# Patient Record
Sex: Male | Born: 1950 | Race: Black or African American | Hispanic: No | Marital: Married | Smoking: Former smoker
Health system: Southern US, Community
[De-identification: ages and names within clinical notes are randomized; demographics above are authoritative.]

## PROBLEM LIST (undated history)

## (undated) DIAGNOSIS — G4730 Sleep apnea, unspecified: Secondary | ICD-10-CM

## (undated) DIAGNOSIS — B182 Chronic viral hepatitis C: Secondary | ICD-10-CM

## (undated) DIAGNOSIS — F329 Major depressive disorder, single episode, unspecified: Secondary | ICD-10-CM

## (undated) DIAGNOSIS — T4145XA Adverse effect of unspecified anesthetic, initial encounter: Secondary | ICD-10-CM

## (undated) DIAGNOSIS — K5790 Diverticulosis of intestine, part unspecified, without perforation or abscess without bleeding: Secondary | ICD-10-CM

## (undated) DIAGNOSIS — J449 Chronic obstructive pulmonary disease, unspecified: Secondary | ICD-10-CM

## (undated) DIAGNOSIS — R600 Localized edema: Secondary | ICD-10-CM

## (undated) DIAGNOSIS — F32A Depression, unspecified: Secondary | ICD-10-CM

## (undated) DIAGNOSIS — K635 Polyp of colon: Secondary | ICD-10-CM

## (undated) DIAGNOSIS — K649 Unspecified hemorrhoids: Secondary | ICD-10-CM

## (undated) DIAGNOSIS — T8859XA Other complications of anesthesia, initial encounter: Secondary | ICD-10-CM

## (undated) DIAGNOSIS — S32010A Wedge compression fracture of first lumbar vertebra, initial encounter for closed fracture: Secondary | ICD-10-CM

## (undated) DIAGNOSIS — K219 Gastro-esophageal reflux disease without esophagitis: Secondary | ICD-10-CM

## (undated) DIAGNOSIS — M1990 Unspecified osteoarthritis, unspecified site: Secondary | ICD-10-CM

## (undated) DIAGNOSIS — I509 Heart failure, unspecified: Secondary | ICD-10-CM

## (undated) DIAGNOSIS — N529 Male erectile dysfunction, unspecified: Secondary | ICD-10-CM

## (undated) DIAGNOSIS — F1910 Other psychoactive substance abuse, uncomplicated: Secondary | ICD-10-CM

## (undated) HISTORY — DX: Unspecified hemorrhoids: K64.9

## (undated) HISTORY — DX: Chronic viral hepatitis C: B18.2

## (undated) HISTORY — DX: Heart failure, unspecified: I50.9

## (undated) HISTORY — DX: Sleep apnea, unspecified: G47.30

## (undated) HISTORY — DX: Major depressive disorder, single episode, unspecified: F32.9

## (undated) HISTORY — DX: Gastro-esophageal reflux disease without esophagitis: K21.9

## (undated) HISTORY — DX: Localized edema: R60.0

## (undated) HISTORY — DX: Polyp of colon: K63.5

## (undated) HISTORY — PX: LUMBAR LAMINECTOMY: SHX95

## (undated) HISTORY — DX: Wedge compression fracture of first lumbar vertebra, initial encounter for closed fracture: S32.010A

## (undated) HISTORY — DX: Male erectile dysfunction, unspecified: N52.9

## (undated) HISTORY — DX: Other psychoactive substance abuse, uncomplicated: F19.10

## (undated) HISTORY — DX: Diverticulosis of intestine, part unspecified, without perforation or abscess without bleeding: K57.90

## (undated) HISTORY — DX: Unspecified osteoarthritis, unspecified site: M19.90

## (undated) HISTORY — DX: Morbid (severe) obesity due to excess calories: E66.01

## (undated) HISTORY — DX: Depression, unspecified: F32.A

---
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
## 1997-12-09 ENCOUNTER — Encounter: Admission: RE | Admit: 1997-12-09 | Discharge: 1998-03-09 | Payer: Self-pay | Admitting: Internal Medicine

## 1999-03-12 ENCOUNTER — Encounter: Payer: Self-pay | Admitting: Emergency Medicine

## 1999-03-12 ENCOUNTER — Inpatient Hospital Stay (HOSPITAL_COMMUNITY): Admission: AD | Admit: 1999-03-12 | Discharge: 1999-03-15 | Payer: Self-pay | Admitting: Psychiatry

## 1999-03-16 ENCOUNTER — Other Ambulatory Visit (HOSPITAL_COMMUNITY): Admission: RE | Admit: 1999-03-16 | Discharge: 1999-03-30 | Payer: Self-pay | Admitting: Psychiatry

## 1999-12-03 ENCOUNTER — Encounter: Admission: RE | Admit: 1999-12-03 | Discharge: 1999-12-03 | Payer: Self-pay | Admitting: Specialist

## 1999-12-03 ENCOUNTER — Encounter: Payer: Self-pay | Admitting: Specialist

## 2000-10-09 ENCOUNTER — Ambulatory Visit (HOSPITAL_COMMUNITY): Admission: RE | Admit: 2000-10-09 | Discharge: 2000-10-09 | Payer: Self-pay | Admitting: Internal Medicine

## 2000-10-09 ENCOUNTER — Encounter (INDEPENDENT_AMBULATORY_CARE_PROVIDER_SITE_OTHER): Payer: Self-pay | Admitting: Specialist

## 2001-03-02 ENCOUNTER — Emergency Department (HOSPITAL_COMMUNITY): Admission: EM | Admit: 2001-03-02 | Discharge: 2001-03-03 | Payer: Self-pay | Admitting: Emergency Medicine

## 2001-10-03 ENCOUNTER — Encounter: Payer: Self-pay | Admitting: Emergency Medicine

## 2001-10-03 ENCOUNTER — Encounter: Payer: Self-pay | Admitting: Orthopedic Surgery

## 2001-10-03 ENCOUNTER — Inpatient Hospital Stay (HOSPITAL_COMMUNITY): Admission: EM | Admit: 2001-10-03 | Discharge: 2001-10-05 | Payer: Self-pay | Admitting: Orthopedic Surgery

## 2002-09-20 ENCOUNTER — Encounter (INDEPENDENT_AMBULATORY_CARE_PROVIDER_SITE_OTHER): Payer: Self-pay | Admitting: Specialist

## 2002-09-20 ENCOUNTER — Ambulatory Visit (HOSPITAL_COMMUNITY): Admission: RE | Admit: 2002-09-20 | Discharge: 2002-09-20 | Payer: Self-pay | Admitting: Internal Medicine

## 2004-04-18 ENCOUNTER — Ambulatory Visit: Payer: Self-pay | Admitting: Internal Medicine

## 2004-05-03 ENCOUNTER — Ambulatory Visit: Payer: Self-pay | Admitting: Internal Medicine

## 2004-05-20 HISTORY — PX: TIBIA FRACTURE SURGERY: SHX806

## 2004-08-14 ENCOUNTER — Ambulatory Visit: Payer: Self-pay | Admitting: Internal Medicine

## 2004-09-25 ENCOUNTER — Ambulatory Visit: Payer: Self-pay | Admitting: Internal Medicine

## 2005-02-05 ENCOUNTER — Ambulatory Visit: Payer: Self-pay | Admitting: Internal Medicine

## 2005-04-04 ENCOUNTER — Ambulatory Visit: Payer: Self-pay | Admitting: Internal Medicine

## 2005-04-09 ENCOUNTER — Ambulatory Visit: Payer: Self-pay | Admitting: Internal Medicine

## 2005-04-16 ENCOUNTER — Ambulatory Visit: Payer: Self-pay | Admitting: Internal Medicine

## 2005-05-10 ENCOUNTER — Emergency Department (HOSPITAL_COMMUNITY): Admission: EM | Admit: 2005-05-10 | Discharge: 2005-05-10 | Payer: Self-pay | Admitting: *Deleted

## 2005-05-15 ENCOUNTER — Ambulatory Visit: Payer: Self-pay | Admitting: Internal Medicine

## 2005-09-27 ENCOUNTER — Emergency Department (HOSPITAL_COMMUNITY): Admission: EM | Admit: 2005-09-27 | Discharge: 2005-09-27 | Payer: Self-pay | Admitting: Family Medicine

## 2005-10-01 ENCOUNTER — Ambulatory Visit: Payer: Self-pay | Admitting: Internal Medicine

## 2006-01-06 ENCOUNTER — Ambulatory Visit: Payer: Self-pay | Admitting: Internal Medicine

## 2006-02-03 ENCOUNTER — Ambulatory Visit (HOSPITAL_COMMUNITY): Admission: RE | Admit: 2006-02-03 | Discharge: 2006-02-03 | Payer: Self-pay | Admitting: Internal Medicine

## 2006-02-03 ENCOUNTER — Encounter (INDEPENDENT_AMBULATORY_CARE_PROVIDER_SITE_OTHER): Payer: Self-pay | Admitting: *Deleted

## 2006-02-12 ENCOUNTER — Ambulatory Visit: Payer: Self-pay | Admitting: Internal Medicine

## 2006-03-11 ENCOUNTER — Ambulatory Visit: Payer: Self-pay | Admitting: Internal Medicine

## 2006-04-06 ENCOUNTER — Emergency Department (HOSPITAL_COMMUNITY): Admission: EM | Admit: 2006-04-06 | Discharge: 2006-04-06 | Payer: Self-pay | Admitting: Emergency Medicine

## 2006-04-06 ENCOUNTER — Inpatient Hospital Stay (HOSPITAL_COMMUNITY): Admission: AD | Admit: 2006-04-06 | Discharge: 2006-04-10 | Payer: Self-pay | Admitting: Psychiatry

## 2006-04-06 ENCOUNTER — Ambulatory Visit: Payer: Self-pay | Admitting: Psychiatry

## 2006-11-12 ENCOUNTER — Ambulatory Visit: Payer: Self-pay | Admitting: Internal Medicine

## 2006-11-12 LAB — CONVERTED CEMR LAB
ALT: 22 units/L (ref 0–53)
AST: 23 units/L (ref 0–37)
Albumin: 3.3 g/dL — ABNORMAL LOW (ref 3.5–5.2)
Alkaline Phosphatase: 59 units/L (ref 39–117)
BUN: 6 mg/dL (ref 6–23)
Basophils Absolute: 0.1 10*3/uL (ref 0.0–0.1)
Basophils Relative: 0.9 % (ref 0.0–1.0)
Bilirubin, Direct: 0.1 mg/dL (ref 0.0–0.3)
CO2: 29 meq/L (ref 19–32)
Calcium: 8.8 mg/dL (ref 8.4–10.5)
Chloride: 102 meq/L (ref 96–112)
Cholesterol: 172 mg/dL (ref 0–200)
Creatinine, Ser: 0.9 mg/dL (ref 0.4–1.5)
Eosinophils Absolute: 0.1 10*3/uL (ref 0.0–0.6)
Eosinophils Relative: 0.8 % (ref 0.0–5.0)
GFR calc Af Amer: 112 mL/min
GFR calc non Af Amer: 92 mL/min
Glucose, Bld: 97 mg/dL (ref 70–99)
HCT: 44 % (ref 39.0–52.0)
HDL: 42.1 mg/dL (ref >39.0)
Hemoglobin: 14.7 g/dL (ref 13.0–17.0)
LDL Cholesterol: 107 mg/dL — ABNORMAL HIGH (ref 0–99)
Lymphocytes Relative: 32.2 % (ref 12.0–46.0)
MCHC: 33.4 g/dL (ref 30.0–36.0)
MCV: 82.5 fL (ref 78.0–100.0)
Monocytes Absolute: 0.5 10*3/uL (ref 0.2–0.7)
Monocytes Relative: 7.2 % (ref 3.0–11.0)
Neutro Abs: 4.3 10*3/uL (ref 1.4–7.7)
Neutrophils Relative %: 58.9 % (ref 43.0–77.0)
PSA: 0.41 ng/mL (ref 0.10–4.00)
Platelets: 289 10*3/uL (ref 150–400)
Potassium: 3.9 meq/L (ref 3.5–5.1)
RBC: 5.33 M/uL (ref 4.22–5.81)
RDW: 14.2 % (ref 11.5–14.6)
Sodium: 142 meq/L (ref 135–145)
TSH: 2.95 microintl units/mL (ref 0.35–5.50)
Total Bilirubin: 0.6 mg/dL (ref 0.3–1.2)
Total CHOL/HDL Ratio: 4.1
Total Protein: 7.1 g/dL (ref 6.0–8.3)
Triglycerides: 116 mg/dL (ref 0–149)
VLDL: 23 mg/dL (ref 0–40)
WBC: 7.4 10*3/uL (ref 4.5–10.5)

## 2006-11-24 DIAGNOSIS — I10 Essential (primary) hypertension: Secondary | ICD-10-CM | POA: Insufficient documentation

## 2006-11-24 DIAGNOSIS — M1990 Unspecified osteoarthritis, unspecified site: Secondary | ICD-10-CM | POA: Insufficient documentation

## 2006-11-24 DIAGNOSIS — E669 Obesity, unspecified: Secondary | ICD-10-CM | POA: Insufficient documentation

## 2006-11-24 DIAGNOSIS — G4733 Obstructive sleep apnea (adult) (pediatric): Secondary | ICD-10-CM | POA: Insufficient documentation

## 2006-11-27 ENCOUNTER — Ambulatory Visit: Payer: Self-pay | Admitting: Internal Medicine

## 2006-12-02 ENCOUNTER — Emergency Department (HOSPITAL_COMMUNITY): Admission: EM | Admit: 2006-12-02 | Discharge: 2006-12-02 | Payer: Self-pay | Admitting: Emergency Medicine

## 2006-12-05 ENCOUNTER — Emergency Department (HOSPITAL_COMMUNITY): Admission: EM | Admit: 2006-12-05 | Discharge: 2006-12-06 | Payer: Self-pay | Admitting: Emergency Medicine

## 2006-12-09 ENCOUNTER — Encounter: Admission: RE | Admit: 2006-12-09 | Discharge: 2006-12-09 | Payer: Self-pay | Admitting: Sports Medicine

## 2006-12-16 ENCOUNTER — Telehealth: Payer: Self-pay | Admitting: Internal Medicine

## 2006-12-25 ENCOUNTER — Encounter: Payer: Self-pay | Admitting: Cardiology

## 2006-12-25 ENCOUNTER — Ambulatory Visit: Payer: Self-pay | Admitting: Cardiology

## 2006-12-25 ENCOUNTER — Ambulatory Visit: Payer: Self-pay

## 2006-12-25 LAB — CONVERTED CEMR LAB
Basophils Relative: 2.9 % — ABNORMAL HIGH (ref 0.0–1.0)
CO2: 28 meq/L (ref 19–32)
Chloride: 103 meq/L (ref 96–112)
Creatinine, Ser: 0.9 mg/dL (ref 0.4–1.5)
Eosinophils Relative: 1.1 % (ref 0.0–5.0)
Glucose, Bld: 118 mg/dL — ABNORMAL HIGH (ref 70–99)
HCT: 46 % (ref 39.0–52.0)
Hemoglobin: 15.3 g/dL (ref 13.0–17.0)
MCHC: 33.2 g/dL (ref 30.0–36.0)
Monocytes Absolute: 0.6 10*3/uL (ref 0.2–0.7)
Neutrophils Relative %: 63.5 % (ref 43.0–77.0)
Potassium: 4.2 meq/L (ref 3.5–5.1)
Pro B Natriuretic peptide (BNP): 2 pg/mL (ref 0.0–100.0)
RBC: 5.48 M/uL (ref 4.22–5.81)
RDW: 13.8 % (ref 11.5–14.6)
Sodium: 139 meq/L (ref 135–145)
WBC: 8.9 10*3/uL (ref 4.5–10.5)

## 2007-01-01 ENCOUNTER — Telehealth (INDEPENDENT_AMBULATORY_CARE_PROVIDER_SITE_OTHER): Payer: Self-pay | Admitting: *Deleted

## 2007-01-20 ENCOUNTER — Telehealth: Payer: Self-pay | Admitting: Internal Medicine

## 2007-01-21 ENCOUNTER — Ambulatory Visit: Payer: Self-pay | Admitting: Internal Medicine

## 2007-01-28 ENCOUNTER — Ambulatory Visit (HOSPITAL_COMMUNITY): Admission: RE | Admit: 2007-01-28 | Discharge: 2007-01-29 | Payer: Self-pay | Admitting: Orthopedic Surgery

## 2007-05-27 ENCOUNTER — Emergency Department (HOSPITAL_COMMUNITY): Admission: EM | Admit: 2007-05-27 | Discharge: 2007-05-27 | Payer: Self-pay | Admitting: Emergency Medicine

## 2007-06-23 ENCOUNTER — Ambulatory Visit: Payer: Self-pay | Admitting: Internal Medicine

## 2007-06-23 DIAGNOSIS — F17200 Nicotine dependence, unspecified, uncomplicated: Secondary | ICD-10-CM | POA: Insufficient documentation

## 2007-06-24 LAB — CONVERTED CEMR LAB
Calcium: 8.9 mg/dL (ref 8.4–10.5)
Chloride: 103 meq/L (ref 96–112)
Creatinine, Ser: 0.8 mg/dL (ref 0.4–1.5)
GFR calc non Af Amer: 106 mL/min
Glucose, Bld: 91 mg/dL (ref 70–99)
Sodium: 141 meq/L (ref 135–145)

## 2008-03-25 ENCOUNTER — Ambulatory Visit: Payer: Self-pay | Admitting: Internal Medicine

## 2008-03-28 LAB — CONVERTED CEMR LAB
BUN: 7 mg/dL (ref 6–23)
Calcium: 8.8 mg/dL (ref 8.4–10.5)
Eosinophils Absolute: 0.1 10*3/uL (ref 0.0–0.7)
Eosinophils Relative: 0.7 % (ref 0.0–5.0)
GFR calc Af Amer: 148 mL/min
GFR calc non Af Amer: 123 mL/min
Glucose, Bld: 96 mg/dL (ref 70–99)
HCT: 43.2 % (ref 39.0–52.0)
Hemoglobin: 14.8 g/dL (ref 13.0–17.0)
MCV: 83.2 fL (ref 78.0–100.0)
Monocytes Absolute: 0.6 10*3/uL (ref 0.1–1.0)
Monocytes Relative: 7.2 % (ref 3.0–12.0)
Neutro Abs: 5.2 10*3/uL (ref 1.4–7.7)
Platelets: 240 10*3/uL (ref 150–400)
RDW: 13.8 % (ref 11.5–14.6)

## 2009-01-17 ENCOUNTER — Telehealth: Payer: Self-pay | Admitting: Internal Medicine

## 2009-01-18 ENCOUNTER — Telehealth: Payer: Self-pay | Admitting: Internal Medicine

## 2009-02-15 ENCOUNTER — Ambulatory Visit: Payer: Self-pay | Admitting: Internal Medicine

## 2009-02-17 LAB — CONVERTED CEMR LAB
ALT: 25 units/L (ref 0–53)
AST: 29 units/L (ref 0–37)
Albumin: 3.3 g/dL — ABNORMAL LOW (ref 3.5–5.2)
Alkaline Phosphatase: 55 units/L (ref 39–117)
BUN: 9 mg/dL (ref 6–23)
Basophils Absolute: 0 10*3/uL (ref 0.0–0.1)
Basophils Relative: 0.6 % (ref 0.0–3.0)
Bilirubin, Direct: 0.1 mg/dL (ref 0.0–0.3)
CO2: 29 meq/L (ref 19–32)
Calcium: 8.7 mg/dL (ref 8.4–10.5)
Chloride: 107 meq/L (ref 96–112)
Cholesterol: 139 mg/dL (ref 0–200)
Creatinine, Ser: 0.9 mg/dL (ref 0.4–1.5)
Eosinophils Absolute: 0.1 10*3/uL (ref 0.0–0.7)
Eosinophils Relative: 1.1 % (ref 0.0–5.0)
GFR calc non Af Amer: 110.66 mL/min (ref >60)
Glucose, Bld: 109 mg/dL — ABNORMAL HIGH (ref 70–99)
HCT: 45.5 % (ref 39.0–52.0)
HDL: 39.4 mg/dL (ref >39.00)
Hemoglobin: 14.6 g/dL (ref 13.0–17.0)
LDL Cholesterol: 74 mg/dL (ref 0–99)
Lymphocytes Relative: 31.4 % (ref 12.0–46.0)
Lymphs Abs: 1.9 10*3/uL (ref 0.7–4.0)
MCHC: 32.2 g/dL (ref 30.0–36.0)
MCV: 85.5 fL (ref 78.0–100.0)
Monocytes Absolute: 0.4 10*3/uL (ref 0.1–1.0)
Monocytes Relative: 6.2 % (ref 3.0–12.0)
Neutro Abs: 3.6 10*3/uL (ref 1.4–7.7)
Neutrophils Relative %: 60.7 % (ref 43.0–77.0)
Platelets: 193 10*3/uL (ref 150.0–400.0)
Potassium: 3.9 meq/L (ref 3.5–5.1)
RBC: 5.32 M/uL (ref 4.22–5.81)
RDW: 13.8 % (ref 11.5–14.6)
Sodium: 140 meq/L (ref 135–145)
TSH: 1.38 microintl units/mL (ref 0.35–5.50)
Total Bilirubin: 0.6 mg/dL (ref 0.3–1.2)
Total CHOL/HDL Ratio: 4
Total Protein: 7.5 g/dL (ref 6.0–8.3)
Triglycerides: 130 mg/dL (ref 0.0–149.0)
VLDL: 26 mg/dL (ref 0.0–40.0)
WBC: 6 10*3/uL (ref 4.5–10.5)

## 2009-03-08 ENCOUNTER — Telehealth: Payer: Self-pay | Admitting: Internal Medicine

## 2009-04-09 ENCOUNTER — Emergency Department (HOSPITAL_COMMUNITY): Admission: EM | Admit: 2009-04-09 | Discharge: 2009-04-09 | Payer: Self-pay | Admitting: Family Medicine

## 2009-04-18 ENCOUNTER — Telehealth: Payer: Self-pay | Admitting: Internal Medicine

## 2009-05-15 ENCOUNTER — Encounter: Payer: Self-pay | Admitting: Internal Medicine

## 2009-05-18 ENCOUNTER — Ambulatory Visit: Payer: Self-pay | Admitting: Internal Medicine

## 2009-05-18 DIAGNOSIS — M25569 Pain in unspecified knee: Secondary | ICD-10-CM | POA: Insufficient documentation

## 2009-05-20 HISTORY — PX: KNEE ARTHROSCOPY: SUR90

## 2009-06-09 ENCOUNTER — Observation Stay (HOSPITAL_COMMUNITY)
Admission: RE | Admit: 2009-06-09 | Discharge: 2009-06-10 | Payer: Self-pay | Source: Home / Self Care | Admitting: Specialist

## 2009-10-04 ENCOUNTER — Telehealth: Payer: Self-pay | Admitting: Internal Medicine

## 2010-01-23 ENCOUNTER — Telehealth: Payer: Self-pay | Admitting: Internal Medicine

## 2010-01-24 ENCOUNTER — Telehealth: Payer: Self-pay | Admitting: Internal Medicine

## 2010-01-31 ENCOUNTER — Ambulatory Visit: Payer: Self-pay | Admitting: Internal Medicine

## 2010-01-31 DIAGNOSIS — F528 Other sexual dysfunction not due to a substance or known physiological condition: Secondary | ICD-10-CM | POA: Insufficient documentation

## 2010-02-02 ENCOUNTER — Encounter: Payer: Self-pay | Admitting: Internal Medicine

## 2010-02-02 LAB — CONVERTED CEMR LAB: HCV Ab: REACTIVE — AB

## 2010-02-05 LAB — CONVERTED CEMR LAB
ALT: 30 units/L (ref 0–53)
Chloride: 111 meq/L (ref 96–112)
GFR calc non Af Amer: 145.02 mL/min (ref >60)
Glucose, Bld: 103 mg/dL — ABNORMAL HIGH (ref 70–99)
Potassium: 4.5 meq/L (ref 3.5–5.1)
Sodium: 143 meq/L (ref 135–145)
Total Bilirubin: 0.4 mg/dL (ref 0.3–1.2)

## 2010-02-08 ENCOUNTER — Ambulatory Visit: Payer: Self-pay | Admitting: Internal Medicine

## 2010-02-08 DIAGNOSIS — B1920 Unspecified viral hepatitis C without hepatic coma: Secondary | ICD-10-CM | POA: Insufficient documentation

## 2010-02-08 LAB — CONVERTED CEMR LAB
Ferritin: 89.2 ng/mL (ref 22.0–322.0)
GGT: 34 units/L (ref 7–51)
INR: 1.1 — ABNORMAL HIGH (ref 0.8–1.0)

## 2010-02-14 ENCOUNTER — Encounter: Admission: RE | Admit: 2010-02-14 | Discharge: 2010-02-14 | Payer: Self-pay | Admitting: Internal Medicine

## 2010-03-06 ENCOUNTER — Encounter: Payer: Self-pay | Admitting: Internal Medicine

## 2010-03-26 ENCOUNTER — Telehealth: Payer: Self-pay | Admitting: Internal Medicine

## 2010-06-07 ENCOUNTER — Ambulatory Visit: Admit: 2010-06-07 | Payer: Self-pay | Admitting: Gastroenterology

## 2010-06-19 NOTE — Progress Notes (Signed)
Summary: needs  refill and samples  Phone Note Call from Patient Call back at [facility] Phone [phone] Call back at [phone]   Caller: Patient Call For: Birdie Sons MD Summary of Call: pt needs refill on trazadone pt take 2 pils  instead of 1pill due to unable to sleep. pt also requesting samples of viagra again. Initial call taken by: Heron Sabins,  Oct 04, 2009 11:58 AM  Follow-up for Phone Call        per med list trazodone is 100mg  Take 1 tablet by mouth at bedtime .  Has increased to two at bedtime per self.   Please advise.  Additional Follow-up for Phone Call Additional follow up Details #1::        limit trazodone to 100 mg he is not healthy enough to use viagra make sure see me within past 6 months Additional Follow-up by: Birdie Sons MD,  Oct 04, 2009 1:29 PM    Additional Follow-up for Phone Call Additional follow up Details #2::    Patient aware & says he will limit Trazodone to 1 per night, but he won't sleep.  He will discuss with him ov that he will make ASAP, probably about 2 weeks.   Follow-up by: Rudy Jew, RN,  Oct 04, 2009 2:40 PM  Prescriptions: TRAZODONE HCL 100 MG  TABS (TRAZODONE HCL) Take 1 tablet by mouth at bedtime  #90 x 0   Entered by:   Rudy Jew, RN   Authorized by:   Birdie Sons MD   Signed by:   Rudy Jew, RN on 10/04/2009   Method used:   Electronically to        [facility] [id number] (retail)       [address] [place], [place]  [address]       Ph: [phone]       Fax: [phone]   RxID:   [id number]

## 2010-06-19 NOTE — Progress Notes (Signed)
Summary: potassium refill  Phone Note Refill Request Message from:  Fax from Pharmacy on January 23, 2010 1:03 PM  Refills Requested: Medication #1:  KLOR-CON M20 20 MEQ  CR-TABS 2 by mouth once daily Initial call taken by: Kern Reap CMA Duncan Dull),  January 23, 2010 1:03 PM    Prescriptions: KLOR-CON M20 20 MEQ  CR-TABS (POTASSIUM CHLORIDE CRYS CR) 2 by mouth once daily  #90 Tablet x 1   Entered by:   Kern Reap CMA (AAMA)   Authorized by:   Birdie Sons MD   Signed by:   Kern Reap CMA (AAMA) on 01/23/2010   Method used:   Electronically to        [facility] [id number] (retail)       [address] [place], [place]  [address]       Ph: [phone]       Fax: [phone]   RxID:   [id number]

## 2010-06-19 NOTE — Letter (Signed)
Summary: Request for Surgical Clearance/[facility]   Request for Surgical Clearance/[facility]   Imported By: Maryln Gottron 05/22/2009 13:55:09  _____________________________________________________________________  External Attachment:    Type:   Image     Comment:   External Document

## 2010-06-19 NOTE — Progress Notes (Signed)
Summary: Hep C  Phone Note Call from Patient   Caller: Patient Call For: Birdie Sons MD Summary of Call: [phone] Pt's brother in law has liver Ca, and pt is questioning if he has ever been tested for Hep C, or if he should be tested?  No symptoms at this time.  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 Initial call taken by: Lynann Beaver CMA,  January 24, 2010 11:08 AM  Follow-up for Phone Call        no immediate concern---we can discuss at next OV Follow-up by: Birdie Sons MD,  January 24, 2010 12:17 PM  Additional Follow-up for Phone Call Additional follow up Details #1::        left message on machine to call back to office. Lucious Groves CMA  January 24, 2010 1:36 PM   Left message on voicemail to call back. Lucious Groves CMA  January 25, 2010 2:14 PM     Additional Follow-up for Phone Call Additional follow up Details #2::    Patient notified and scheduled appt. Follow-up by: Lucious Groves CMA,  January 26, 2010 9:26 AM

## 2010-06-19 NOTE — Progress Notes (Signed)
Summary: Pt returning phone call. Pls call back.  Phone Note Call from Patient Call back at [phone] cell   Caller: Patient Summary of Call: Pt called and said that he has been trying to return someone call from [facility] he rcvd last week. Pls call pt at earliest convenience.  Initial call taken by: Lucy Antigua,  March 26, 2010 4:23 PM  Follow-up for Phone Call        sent in a rx for potssium and pharmacy sent a fax back asking for a member number.  Acct [phone].  Form faxed  back to pharmacy Follow-up by: Alfred Levins, CMA,  March 26, 2010 4:42 PM

## 2010-06-19 NOTE — Assessment & Plan Note (Signed)
Summary: OV TO DISCUSS HEP C/KB   Vital Signs:  Patient profile:   60 year old male Height:      72 inches Weight:      425 pounds BMI:     57.85 Temp:     98.3 degrees F oral BP sitting:   124 / 80  (left arm) Cuff size:   large  Vitals Entered By: Kern Reap CMA Duncan Dull) (January 31, 2010 11:12 AM) CC: concerns with hep c   CC:  concerns with hep c.  History of Present Illness: concerned with hepatitis C---Brother in law with HEp C, now living with him more importantly pt admits for the first time that he abused iv drugs in the 80s no hx of hepatitis or jaundice hx of olcohol overuse no recent fever or chills , no chronic recurrent infections  he is interested in treatment for ED  Current Medications (verified): 1)  Lasix 40 Mg Tabs (Furosemide) .... One By Mouth Daily 2)  Omeprazole 20 Mg Cpdr (Omeprazole) .... One By Mouth Daily Prn 3)  Bupropion Hcl 150 Mg  Tb12 (Bupropion Hcl) .... Take 1 Tablet By Mouth Two Times A Day 4)  Trazodone Hcl 100 Mg  Tabs (Trazodone Hcl) .... Take 1 Tablet By Mouth At Bedtime 5)  Klor-Con M20 20 Meq  Cr-Tabs (Potassium Chloride Crys Cr) .... 2 By Mouth Once Daily  Allergies: 1)  Penicillin V Potassium (Penicillin V Potassium) 2)  Sulfamethoxazole (Sulfamethoxazole)  Past History:  Past Medical History: Last updated: 03/25/2008 Depression Hypertension Osteoarthritis sleep apnea LV dysfunction,hx of OBESE  Past Surgical History: Last updated: 05/18/2009 herniated disc L5 1995 back surgery 2008 Tonsillectomy--as child  Social History: Last updated: 01/31/2010 Married Current Smoker Regular exercise-no IVDA 1980s  Risk Factors: Exercise: no (06/23/2007)  Risk Factors: Smoking Status: current (05/18/2009) Packs/Day: 1.0 (05/18/2009)  Social History: Married Current Smoker Regular exercise-no IVDA 1980s  Review of Systems       Flu Vaccine Consent Questions     Do you have a history of severe allergic  reactions to this vaccine? no    Any prior history of allergic reactions to egg and/or gelatin? no    Do you have a sensitivity to the preservative Thimersol? no    Do you have a past history of Guillan-Barre Syndrome? no    Do you currently have an acute febrile illness? no    Have you ever had a severe reaction to latex? no    Vaccine information given and explained to patient? yes    Are you currently pregnant? no    Lot Number:[id number]   Exp Date:11/17/2010   Site Given  Left Deltoid IM   Physical Exam  General:  alert and well-developed.  morbidly obese Head:  normocephalic and atraumatic.   Eyes:  pupils equal and pupils round.  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 One by mouth daily 2)  Omeprazole 20 Mg Cpdr (Omeprazole) .... One by mouth  daily prn 3)  Bupropion Hcl 150 Mg Tb12 (Bupropion hcl) .... Take 1 tablet by mouth two times a day 4)  Trazodone Hcl 100 Mg Tabs (Trazodone hcl) .... Take 1 tablet by mouth at bedtime 5)  Klor-con M20 20 Meq Cr-tabs (Potassium chloride crys cr) .... 2 by mouth once daily  Other Orders: Admin 1st Vaccine (44010) Flu Vaccine 18yrs + (27253) TLB-BMP (Basic Metabolic Panel-BMET) (80048-METABOL)

## 2010-06-19 NOTE — Assessment & Plan Note (Signed)
Summary: discuss labs/dm   Vital Signs:  Patient profile:   60 year old male Height:      72 inches (182.88 cm) Weight:      425 pounds (193.18 kg) Temp:     97.9 degrees F (36.61 degrees C) oral Pulse rate:   90 / minute BP sitting:   116 / 80  (left arm) Cuff size:   large  Vitals Entered By: Josph Macho RMA (February 08, 2010 11:34 AM)  O2 Flow:  Room air CC: Go over lab results/ CF Is Patient Diabetic? No   CC:  Go over lab results/ CF.  Current Medications (verified): 1)  Lasix 40 Mg Tabs (Furosemide) .... One By Mouth Daily 2)  Omeprazole 20 Mg Cpdr (Omeprazole) .... One By Mouth Daily Prn 3)  Bupropion Hcl 150 Mg  Tb12 (Bupropion Hcl) .... Take 1 Tablet By Mouth Two Times A Day 4)  Trazodone Hcl 100 Mg  Tabs (Trazodone Hcl) .... Take 1 Tablet By Mouth At Bedtime 5)  Klor-Con M20 20 Meq  Cr-Tabs (Potassium Chloride Crys Cr) .... 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 It's likely that he was exposed in the 1980s related to IV drug abuse. He understands the need to inform his family members and wife especially. We will set up an appointment with hepatitis C clinic will order laboratories and imaging studies. Patient understands the workup and understands that he will followup with the hepatitis C clinic. total face-to-face time 30 minutes. All spent in counseling and ordering tests and scheduling referrals. Orders: Venipuncture ([address]) T-Hepatitis C RNA Quant PCR ([id number]) Specimen Handling (08657) Hepatitis C Clinic Referral (HepC) Radiology Referral (Radiology) TLB-GGT (Gamma GT) (82977-GGT) TLB-Ferritin (82728-FER) TLB-PTT (85730-PTTL) TLB-PT (Protime) (85610-PTP)  Complete Medication List: 1)  Lasix 40 Mg Tabs  (Furosemide) .... One by mouth daily 2)  Omeprazole 20 Mg Cpdr (Omeprazole) .... One by mouth daily prn 3)  Bupropion Hcl 150 Mg Tb12 (Bupropion hcl) .... Take 1 tablet by mouth two times a day 4)  Trazodone Hcl 100 Mg Tabs (Trazodone hcl) .... Take 1 tablet by mouth at bedtime 5)  Klor-con M20 20 Meq Cr-tabs (Potassium chloride crys cr) .... 2 by mouth once daily

## 2010-08-05 LAB — DIFFERENTIAL
Basophils Relative: 1 % (ref 0–1)
Lymphs Abs: 2.4 10*3/uL (ref 0.7–4.0)
Monocytes Relative: 7 % (ref 3–12)
Neutro Abs: 5.2 10*3/uL (ref 1.7–7.7)
Neutrophils Relative %: 62 % (ref 43–77)

## 2010-08-05 LAB — CBC
HCT: 44.3 % (ref 39.0–52.0)
Hemoglobin: 14.4 g/dL (ref 13.0–17.0)
MCHC: 32.6 g/dL (ref 30.0–36.0)
RDW: 14.7 % (ref 11.5–15.5)

## 2010-08-05 LAB — ABO/RH: ABO/RH(D): A POS

## 2010-08-05 LAB — COMPREHENSIVE METABOLIC PANEL
Albumin: 3.1 g/dL — ABNORMAL LOW (ref 3.5–5.2)
BUN: 7 mg/dL (ref 6–23)
Calcium: 8.8 mg/dL (ref 8.4–10.5)
Creatinine, Ser: 0.82 mg/dL (ref 0.4–1.5)
Total Protein: 7 g/dL (ref 6.0–8.3)

## 2010-08-05 LAB — URINALYSIS, ROUTINE W REFLEX MICROSCOPIC
Hgb urine dipstick: NEGATIVE
Protein, ur: NEGATIVE mg/dL
Urobilinogen, UA: 1 mg/dL (ref 0.0–1.0)
pH: 5.5 (ref 5.0–8.0)

## 2010-08-05 LAB — URINE MICROSCOPIC-ADD ON

## 2010-08-05 LAB — PROTIME-INR: INR: 1.17 (ref 0.00–1.49)

## 2010-08-05 LAB — CROSSMATCH: Antibody Screen: NEGATIVE

## 2010-08-05 LAB — APTT: aPTT: 27 seconds (ref 24–37)

## 2010-08-14 ENCOUNTER — Other Ambulatory Visit: Payer: Self-pay | Admitting: *Deleted

## 2010-08-14 DIAGNOSIS — F329 Major depressive disorder, single episode, unspecified: Secondary | ICD-10-CM

## 2010-08-14 DIAGNOSIS — K219 Gastro-esophageal reflux disease without esophagitis: Secondary | ICD-10-CM

## 2010-08-14 DIAGNOSIS — G4700 Insomnia, unspecified: Secondary | ICD-10-CM

## 2010-08-14 MED ORDER — BUPROPION HCL ER (SR) 150 MG PO TB12: 150.0000 mg | ORAL_TABLET | Freq: Two times a day (BID) | ORAL | Status: DC

## 2010-08-14 MED ORDER — OMEPRAZOLE 20 MG PO CPDR: 20.0000 mg | DELAYED_RELEASE_CAPSULE | Freq: Two times a day (BID) | ORAL | Status: DC

## 2010-08-14 MED ORDER — TRAZODONE HCL 100 MG PO TABS: 100.0000 mg | ORAL_TABLET | Freq: Every day | ORAL | Status: DC
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
## 2010-10-02 NOTE — Assessment & Plan Note (Signed)
[facility]                                 ON-CALL NOTE   MEREL, SANTOLI                          MRN:          [id number]  DATE:12/07/2006                            DOB:          Jul 14, 1950    PHONE NUMBER:  [phone].   This is a patient of Dr. Cato Mulligan.   Gentleman calls about his back pain, sciatica which is severe.  He  related a long story about his back including his ER visit on Friday for  which he required IV narcotics because prednisone type medications and  regular pain medicines were not helping.  He is now on hydromorphone 4  mg every four to six hours from the emergency room and has an  appointment with the orthopedist on Tuesday.  He wonders what else he  can take.  He has no other neurologic symptoms and no fever.  He has  Ultram from when Dr. Cato Mulligan gave it to him before, although it has not  helped and wonders if he could take it together.  I told him I did not  think that they would work well together in helping with his pain,  although he could take Tylenol or ibuprofen.  However, he should call  his pharmacist to look at drug interactions and further advice on the  medication and to call the office tomorrow if need be.  Otherwise, he  absolutely needs to be seen by the orthopedist on Tuesday.     Neta Mends. Panosh, MD  Electronically Signed    WKP/MedQ  DD: 12/07/2006  DT: 12/07/2006  Job #: [id number]

## 2010-10-02 NOTE — Op Note (Signed)
Derrick Mata, Derrick Mata                 ACCOUNT NO.:  [id number]   MEDICAL RECORD NO.:  [id number]          PATIENT TYPE:  OIB   LOCATION:  1535                         FACILITY:  [facility]   PHYSICIAN:  Georges Lynch. Gioffre, M.D.DATE OF BIRTH:  [birth year]   DATE OF PROCEDURE:  01/28/2007  DATE OF DISCHARGE:                               OPERATIVE REPORT   SURGEON:  Georges Lynch. Darrelyn Hillock, M.D.   ASSISTANT:  Jene Every, M.D.   PREOPERATIVE DIAGNOSIS:  Large herniated lumbar disc at L5-S1 on the  right.  Note, all his symptoms involve the right S1 nerve root.  He had  pain down the posterior aspect of his right leg, right calf, and down in  the plantar aspect of his right foot, and decreased sensation and  numbness along the lateral aspect of his right foot.   POSTOPERATIVE DIAGNOSIS:  Large herniated lumbar disc at L5-S1 on the  right.  Note, all his symptoms involve the right S1 nerve root.  He had  pain down the posterior aspect of his right leg, right calf, and down in  the plantar aspect of his right foot, and decreased sensation and  numbness along the lateral aspect of his right foot.   OPERATION:  1. Decompression of the lateral recess at L5-S1 for spinal stenosis.  2. Microdiscectomy at L5-S1 on the right.  3. Foraminotomy at L5-S1 on the right.   PROCEDURE:  Under general anesthesia with the patient on the regular  operating table with rolls to protect him, sterile prep and draping of  the low back was carried out.  He had 2 grams of IV Ancef.  At that  time, an incision was made through the old incision site.  We went down  and identified the sacrum and worked our way up to the L5-S1 interspace.  An x-ray was taken followed by a second x-ray.  Note, he weighs 450  pounds and we went through all the measures preop for the proper  instruments and retractors.  Once we identified the L5-S1 interspace, we  then went down and carried out hemilaminectomy in the usual fashion.  Note, his  lateral recess was severely tight and the nerve root was  extremely tight, so what we did was we gently went out laterally and  decompressed the lateral recess.  We did a partial facetectomy.  We then  went out and did a nice foraminotomy, as well.  We went up proximally as  well to expose the space to decompress the space first.  The microscope  was used during the procedure.  We then removed the ligamentum flavum in  the usual fashion.  We went down and identified the S1 root. Immediately  upon identifying the S1 root, we noted that the root was discolored and  indented from the severe pressure from his stenosis.  We then gently  examined the axillary region of the root.  We gently retracted the root  and the dura.  We cauterized the lateral recess veins with the bipolar.  A cruciate incision was made at the posterior longitudinal ligament.  Immediately upon doing that, dic material extruded out through the site  under an extreme amount of pressure.  Following that, we then utilized a  nerve hook to remove the other fragments of disc that were  subligamentous migrating down toward the foramen. Also, we went medially  with the nerve hook as well as the Epstein curets.  We decompressed the  disc into the disc space and completed the discectomy.  We explored  proximally, as well.  There were no other fragments noted. We had a nice  decompression of the root but the root was really irritated from all the  pressure.  We thoroughly irrigated out the area, loosely applied some  thrombin soaked  Gelfoam, closed the wound in layers in the usual fashion.  I did leave a  small portion of the proximal and distal deep portions of the wound open  for drainage purposes.  Sterile dressings were applied.  The patient  left the operating room in satisfactory condition.           ______________________________  Georges Lynch Darrelyn Hillock, M.D.     RAG/MEDQ  D:  01/28/2007  T:  01/28/2007  Job:  [id number]

## 2010-10-02 NOTE — Assessment & Plan Note (Signed)
[facility]                            CARDIOLOGY OFFICE NOTE   NAME:MACKEY, MERLEN GURRY                        MRN:          [id number]  DATE:12/26/2006                            DOB:          1951-04-02    I have received and reviewed Mr. Colyn Miron echocardiogram.  Surprisingly, he has normal left ventricular function and no evidence of  any valvular heart disease.  His right side function was also said to be  normal.   His blood work including CBC, TSH, BNP, and Chem-7 were normal as well.   I spoke to Mr. Damita Lack by phone this morning.  I have tried to call Dr.  Darrelyn Hillock to clear him for surgery because of all his pain.  Mr. Damita Lack  tells me he is going to the beach today for a week.  I am getting ready  to leave on vacation as well.  Dr. Darrelyn Hillock could not be reached today.   PLAN:  1. No need for followup in the office.  2. The patient strongly urged to loose weight.  It was discussed in      the office yesterday.  3. I will clear him for surgery with a moderate to high risk because      of his obesity.  4. I would also recommend Dr. Darrelyn Hillock have pulmonary critical care get      involved in his perioperative period.     Thomas C. Daleen Squibb, MD, [facility]  Electronically Signed    TCW/MedQ  DD: 12/26/2006  DT: 12/26/2006  Job #: [id number]   cc:   Windy Fast A. Darrelyn Hillock, M.D.  Bruce Rexene Edison Swords, MD

## 2010-10-05 NOTE — Assessment & Plan Note (Signed)
[facility]                           GASTROENTEROLOGY OFFICE NOTE   NAME:Derrick Mata, Derrick Mata                        MRN:          [id number]  DATE:01/06/2006                            DOB:          1950/09/18    REASON FOR EVALUATION:  Surveillance colonoscopy and rectal bleeding.   HISTORY OF PRESENT ILLNESS:  This is a 60 year old African American male  with a history of morbid obesity, obstructive sleep apnea, asthma, tobacco  abuse and congestive heart failure.  He was evaluated in May of 2002 for  groin discomfort and rectal bleeding.  He underwent complete colonoscopy Sep 20, 2002.  Examination revealed internal hemorrhoids, left-sided  diverticulosis, and multiple diminutive colon polyps, which were removed and  found to be adenomatous.  Followup in 3 years recommended.  The patient has  been doing well until last 05-19-23, when he had pronounced rectal bleeding,  with blood filling the toilet bowel.  There was no associated abdominal pain  or rectal pain.  He states that he was evaluated in ER and told that it was  probably a hemorrhoid.  He has had no recurrent bleeding since.   GASTROINTESTINAL REVIEW OF SYSTEMS:  Remarkable only for occasional gas.   PAST MEDICAL HISTORY:  As above.   PAST SURGICAL HISTORY:  1. Herniated disk.  2. Cystectomy.   ALLERGIES:  1. PENICILLIN.  2. SULFA.   CURRENT MEDICATIONS:  1. Omeprazole 20 mg daily.  2. Furosemide 40 mg daily.  3. Potassium chloride 20 mEq daily.  4. Bupropion 150 mg daily.  5. Spiriva once daily.  6. He also uses trazodone and Advair p.r.n.   SOCIAL HISTORY:  The patient is retired.  Married, with 4 children.  Smokes.  He has a prior history of alcoholism, and has attended [employer]   FAMILY HISTORY:  Since his last evaluation he tells me that his sister was  diagnosed with colon cancer and colon polyps.  She was diagnosed at age 47.   PHYSICAL EXAMINATION:  GENERAL:   Massively obese gentleman in no acute  distress.  VITAL SIGNS:  Blood pressure 120/60, heart rate is 78 and regular, weight is  458.6 pounds.  He is 6 feet 1 inch in height.  HEENT:  Sclerae are anicteric, conjunctivae are pink, oral mucosa is intact.  LUNGS:  Clear.  HEART:  Regular.  ABDOMEN:  Obese and soft without obvious tenderness or mass, good bowel  sounds heard.   IMPRESSION:  [age]-year-old gentleman with a history of multiple  adenomatous colon polyps, family history of colon cancer, and an episode of  bleeding last year.  I agree that the bleeding was probably hemorrhoidal,  though it may have been diverticular-based on history.  Currently due for a  surveillance colonoscopy.  His biggest risk factor is his body habitus and  sleep apnea.   RECOMMENDATIONS:  Colonoscopy with polypectomy if necessary.  This will be  performed at the hospital due to his comorbidities.  The nature of the  procedure, as well as the risks, benefits and alternatives were carefully  explained to  the patient.  He understood, and was anxious to proceed.                                   Wilhemina Bonito. Eda Keys., MD   JNP/MedQ  DD:  01/07/2006  DT:  01/08/2006  Job #:  [id number]   cc:   Valetta Mole. Swords, MD

## 2010-10-05 NOTE — Discharge Summary (Signed)
Derrick Mata, Derrick Mata                 ACCOUNT NO.:  [id number]   MEDICAL RECORD NO.:  [id number]          PATIENT TYPE:  IPS   LOCATION:  0508                          FACILITY:  [facility]   PHYSICIAN:  Derrick Mata, M.D.      DATE OF BIRTH:  [birth year]   DATE OF ADMISSION:  04/06/2006  DATE OF DISCHARGE:  04/10/2006                               DISCHARGE SUMMARY   CHIEF COMPLAINT:  This is the second admission to [facility] for this 60 year old African American male married, voluntarily  admitted with relapse on alcohol one month prior to this admission,  followed by relapses on crack cocaine.  The last drink the night before  this admission, drinking close to a fifth a day, doing this several  times a week.  He was sober six years, going to meetings.  He times his  retirement with stressor along with his __________ gone and was looking  for a way to shoot himself.   PAST PSYCHIATRIC HISTORY:  A second time at [facility], his third  prior admission.  Cannot be evaluated in __________ .  No history of  cocaine for four to five times in the past two weeks.  Alcohol since age  89.   MEDICAL HISTORY:  1. Sleep apnea.  2. Congestive heart failure.  3. Morbid obesity.   MEDICATIONS:  1. Wellbutrin in the morning and at night.  2. Nexium.  3. Lasix 40 mg per day.  4. K-Dur 40 mEq daily.   PHYSICAL EXAMINATION:  Performed and did not show any acute findings.   LABORATORY WORKUP:  Blood chemistries, liver enzymes:  SGOT 28, SGPT 22,  total bilirubin 0.7, TSH 1.774.  Drug screening positive for cocaine.  Sodium 143, potassium 4.2, glucose 117, BUN 2, creatinine 0.8.   PHYSICAL EXAMINATION:  This is a fully alert, anxious male __________  his alcohol use.  There is normal rate and __________ .  Mood depressed.  Affect depressed.  __________  and relevant.  Suicide ideation with plan  to shoot himself.  Sense of hopelessness and helplessness.  Upset with  himself  for his active relapse.  Neuro evaluation shows no  hallucination.  Cognition was preserved.   IMPRESSION:  AXIS I:  1. Major depression, recurrent.  2. Alcohol dependence.  3. Cocaine abuse.  AXIS II:  No diagnosis.  AXIS III:  1. Obstructive sleep apnea.  2. Morbid obesity.  3. Congestive heart failure.  AXIS IV:  Moderate.  AXIS V:  Upon admission 30, highest in the last year is 65.   HOSPITAL COURSE:  He was admitted.  He was started in individual and  group psychotherapy.  He was given Ambien for sleep.  He was detoxified  with Librium, maintained on the K-Dur 40 mEq daily, Lasix 40 mg per day,  Spiriva 18 mg daily, Advair Discus 250/50 one puff twice a day.  We  started Librium protocol.  We prescribed Remeron for sleep.  Endorsed  that had three years relapsing on alcohol and crack.  Used to go to  meeting,  was okay when he was going.  He quit going.  Endorsed boredom.  He retired in 2000, not much to do.  Feeling miserable, cannot sleep in  bed due to his being so awake.  He sits in a recliner, cannot sleep with  the wife, cannot have sex.  He was going to shoot himself, going to use  a gun.  Worked 32 years in [place] and has been married 29 years, two  stepchildren, two biological children.  He started alcohol when he was  10.  He has a great-uncle who is close with history of alcohol  dependence.  He has been on Wellbutrin.  The mother has history of  depression.   Pursued the detox further.  Aware of the underlying depressed mood with  no energy and no motivation.  Endorses pain in his legs, shortness of  breath.  Sleeping was an issue.  Ambien did not help and that is when he  was switched to Remeron.  We pursued further detox and he had issues  sleeping in the unit because he used to use a recliner.  Could not sleep  through a night, very tired in the morning, falling asleep.   Family session April 09, 2006 with his wife and though he was not  suicidal but  endorsed that he did not want to cause misery to his  family.  Wife endorsed that he was a good [name].  She has always been  supportive through his use of alcohol and drug use.  They talk about  improving their communication.  The possibility of going to a  residential program was raised but he would rather not.  Endorsed that  he had been through multiple treatments and he knew what he needed to  do.  He has a prior history of being successful going to meetings.  He  was planning to go back to meetings.   In April 10, 2006, he was in full contact with reality and there was  no suicidal or homicidal ideas.  No hallucination and no delusions.  No  acute withdrawal.  Dealing with a lot of issues, having to sleep in a  recliner.  He felt that he was going to sleep better at home.  He could  take it from here.  Will go to AA.  Also as he abstains, he could become  again a candidate for bariatric surgery and he was going to go to an  orientation in January 2008.  So he had like a sense of future, things  could get better, committed to abstinence.   DISCHARGE DIAGNOSES:  AXIS I:  1. Major depression, recurrent.  2. Alcohol dependence.  3. Cocaine abuse.  AXIS II:  No diagnosis.  AXIS III:  1. Obstructive sleep apnea.  2. Morbid obesity.  3. Congestive heart failure.  AXIS IV:  Moderate.  AXIS V:  Upon discharge 50-55.   Discharge home on Lasix 40 mg per day, Spiriva 18 mg nebulizer daily,  Advair 250/50 one puff twice a day, Nexium 20 mg per day, K-Dur 20 mEq  per day, Remeron 45 mg at bedtime.   Follow up in outpatient clinic.      Derrick Mata, M.D.  Electronically Signed     IL/MEDQ  D:  05/02/2006  T:  05/03/2006  Job:  [id number]

## 2010-10-05 NOTE — Op Note (Signed)
[facility]  Patient:    Derrick Mata, Derrick Mata Visit Number: [id number] MRN: [id number]          Service Type: SUR Attending Physician:  Cain Sieve Dictated by:   Vania Rea. Supple, M.D. Proc. Date: 10/03/01 Admit Date:  10/03/2001 Discharge Date: 10/05/2001                             Operative Report  PREOPERATIVE DIAGNOSIS:  Displaced left distal fibular fracture.  POSTOPERATIVE DIAGNOSIS:  Displaced left distal fibular fracture.  PROCEDURE:  Open reduction and internal fixation of left distal fibular fracture.  SURGEON:  Vania Rea. Supple, M.D.  ASSISTANTDruscilla Brownie. Underwood III, P.A.-C.  ANESTHESIA:  General endotracheal.  TOURNIQUET TIME:  Forty-six minutes.  ESTIMATED BLOOD LOSS:  Minimal.  DRAINS:  None.  HISTORY:  The patient is a 60 year old gentleman who sustained a displaced left distal fibular fracture as well as widening of the medial clear space with lateral subluxation of the talus.  He is brought to the [facility] operating room today, Saturday, Oct 03, 2001, for planned open reduction and internal fixation of the displaced fracture.  Preoperatively, the patient was counseled on treatment options as well as risks versus benefits thereof.  Possible complications of bleeding, infection, neurovascular injury, DVT, PE, malunion, nonunion, loss of fixation, and possible need for additional surgery were all reviewed.  He understands, accepts, and agrees with our planned procedure.  DESCRIPTION OF PROCEDURE:  After undergoing routine preoperative evaluation, the patient was placed supine on the operating room table and underwent smooth induction of general endotracheal anesthesia.  He did receive prophylactic antibiotics.  The tourniquet was applied to the left calf due to his large size.  The left foot and ankle and lower leg were then sterilely prepped and draped in the standard fashion.  The leg was exsanguinated with  the tourniquet inflated to 250 mmHg.  We made a direct lateral approach to the distal fibula through an 8 cm long incision centered over the distal fibula.  Skin flaps were elevated anteriorly and posteriorly with electrocautery and used for hemostasis.  The deep fascia was then divided along the line of the skin incision, reflecting the peroneal musculature and tendons posteriorly and allowing [place] to expose the distal fibula with subperiosteal dissection.  The fracture site was exposed and reduced.  We fashioned a seven-hole one-third tubular plate to fit over the posterolateral aspect of the distal fibula, and this was then applied utilizing 3.5 cortical screws proximally, 4.0 cancellous screws distally, and a lag screw x2 of 3.5 cortical screw.  Anatomic reduction at the fracture site with good alignment was achieved clinically. Fluoroscopic images were then obtained which confirmed good position of the hardware and anatomic alignment of the fracture site.  At this point, the wound was then copiously irrigated.  Hemostasis was achieved.  The wound was closed in layers with 0 Vicryl at the deep fascia, 2-0 Vicryl for the subcutaneous, and staples applied to the skin.  Adaptic was applied over the incision.  We did instill 20 cc of plain Marcaine, 0.5% into the incision edges at the end of the case.  The tourniquet was then let down at this point.  A bulky dry dressing was then applied about the foot and ankle and a well-padded short leg plastic U-splint was applied with the foot in inversion to help reduce the talus beneath the tibial plafond.  The patient was subsequently extubated and taken to the recovery room in stable condition. Dictated by:   Vania Rea. Supple, M.D. Attending Physician:  Cain Sieve DD:  10/03/01 TD:  10/06/01 Job: [id number]

## 2010-10-31 ENCOUNTER — Other Ambulatory Visit: Payer: Self-pay | Admitting: *Deleted

## 2010-10-31 MED ORDER — TRAZODONE HCL 100 MG PO TABS: 100.0000 mg | ORAL_TABLET | Freq: Every day | ORAL | Status: DC

## 2010-11-02 ENCOUNTER — Other Ambulatory Visit: Payer: Self-pay | Admitting: *Deleted

## 2010-11-02 MED ORDER — POTASSIUM CHLORIDE CRYS ER 20 MEQ PO TBCR: 20.0000 meq | EXTENDED_RELEASE_TABLET | Freq: Two times a day (BID) | ORAL | Status: DC

## 2010-12-06 ENCOUNTER — Telehealth: Payer: Self-pay

## 2010-12-06 NOTE — Telephone Encounter (Signed)
Pt requesting an rx for a new CPAP machine because the one that he has is from 2007. Please advise

## 2010-12-19 ENCOUNTER — Other Ambulatory Visit: Payer: Self-pay | Admitting: *Deleted

## 2010-12-19 DIAGNOSIS — F329 Major depressive disorder, single episode, unspecified: Secondary | ICD-10-CM

## 2010-12-19 MED ORDER — BUPROPION HCL ER (SR) 150 MG PO TB12: 150.0000 mg | ORAL_TABLET | Freq: Two times a day (BID) | ORAL | Status: DC

## 2011-01-30 ENCOUNTER — Encounter: Payer: Self-pay | Admitting: Internal Medicine

## 2011-02-06 ENCOUNTER — Encounter: Payer: Self-pay | Admitting: Internal Medicine

## 2011-02-28 ENCOUNTER — Ambulatory Visit (AMBULATORY_SURGERY_CENTER): Payer: Medicare Other | Admitting: *Deleted

## 2011-02-28 ENCOUNTER — Encounter: Payer: Self-pay | Admitting: Internal Medicine

## 2011-02-28 ENCOUNTER — Telehealth: Payer: Self-pay

## 2011-02-28 VITALS — Ht 73.0 in | Wt >= 6400 oz

## 2011-02-28 DIAGNOSIS — Z1211 Encounter for screening for malignant neoplasm of colon: Secondary | ICD-10-CM

## 2011-02-28 MED ORDER — PEG-KCL-NACL-NASULF-NA ASC-C 100 G PO SOLR: ORAL | Status: DC

## 2011-02-28 NOTE — Progress Notes (Signed)
Pt weighs 462 lb.  Derrick Mata will schedule pt at [facility].  Pt admits to drinking 1/2 pint liquor daily, has sleep apnea, and takes several meds for anxiety.  Bonita Quin was informed and will schedule pt with Propofol and call pt to give prep instructions when pt is scheduled for procedure.  Ezra Sites

## 2011-02-28 NOTE — Telephone Encounter (Signed)
Pt is currently scheduled with Dr. Marina Goodell for recall colon 03/14/11 in [facility]. Pt weighs 462 pounds and has to be done at the hospital. When would you like to schedule this pt at the hospital? Dr. Marina Goodell please advise.

## 2011-02-28 NOTE — Telephone Encounter (Signed)
Pt also needs to be propofol case per Pre-visit nurse. Pt has h/o drinking 1/2 pint of alcohol a day and has sleep apnea.

## 2011-02-28 NOTE — Telephone Encounter (Signed)
My next hospital week (November) in am on day with propofol. Thanks

## 2011-03-01 ENCOUNTER — Encounter: Payer: Self-pay | Admitting: *Deleted

## 2011-03-01 LAB — PROTIME-INR
INR: 1.1
Prothrombin Time: 14.6

## 2011-03-01 LAB — BASIC METABOLIC PANEL
Calcium: 9.1
GFR calc Af Amer: >60
GFR calc non Af Amer: >60
Glucose, Bld: 106 — ABNORMAL HIGH
Potassium: 4.3
Sodium: 138

## 2011-03-01 LAB — HEMOGLOBIN AND HEMATOCRIT, BLOOD
HCT: 46
Hemoglobin: 15.3

## 2011-03-06 NOTE — Telephone Encounter (Signed)
Scheduled pt for colon with propofol Thursday 04/18/11@10 :15am. Scheduled with Sue Lush #[id number]. Pt aware of appt date and time. Prep instructions mailed to the pt.

## 2011-03-14 ENCOUNTER — Other Ambulatory Visit: Payer: Self-pay | Admitting: Internal Medicine

## 2011-03-15 ENCOUNTER — Other Ambulatory Visit: Payer: Self-pay | Admitting: *Deleted

## 2011-03-15 MED ORDER — TRAZODONE HCL 100 MG PO TABS: 100.0000 mg | ORAL_TABLET | Freq: Every day | ORAL | Status: DC

## 2011-03-27 ENCOUNTER — Emergency Department (HOSPITAL_COMMUNITY)
Admission: EM | Admit: 2011-03-27 | Discharge: 2011-03-28 | Disposition: A | Discharge status: Other Acute Inpatient Hospital | Payer: 59 | Source: Home / Self Care | Attending: Emergency Medicine | Admitting: Emergency Medicine

## 2011-03-27 ENCOUNTER — Encounter (HOSPITAL_COMMUNITY): Payer: Self-pay

## 2011-03-27 DIAGNOSIS — F1010 Alcohol abuse, uncomplicated: Secondary | ICD-10-CM | POA: Insufficient documentation

## 2011-03-27 DIAGNOSIS — K219 Gastro-esophageal reflux disease without esophagitis: Secondary | ICD-10-CM | POA: Insufficient documentation

## 2011-03-27 DIAGNOSIS — I509 Heart failure, unspecified: Secondary | ICD-10-CM | POA: Insufficient documentation

## 2011-03-27 DIAGNOSIS — F1410 Cocaine abuse, uncomplicated: Secondary | ICD-10-CM | POA: Insufficient documentation

## 2011-03-27 DIAGNOSIS — G4730 Sleep apnea, unspecified: Secondary | ICD-10-CM | POA: Insufficient documentation

## 2011-03-27 DIAGNOSIS — Z79899 Other long term (current) drug therapy: Secondary | ICD-10-CM | POA: Insufficient documentation

## 2011-03-27 LAB — CBC
HCT: 50.2 % (ref 39.0–52.0)
MCH: 29.1 pg (ref 26.0–34.0)
MCV: 86.6 fL (ref 78.0–100.0)
Platelets: 294 10*3/uL (ref 150–400)
RBC: 5.8 MIL/uL (ref 4.22–5.81)
RDW: 15.1 % (ref 11.5–15.5)

## 2011-03-27 LAB — RAPID URINE DRUG SCREEN, HOSP PERFORMED
Amphetamines: NOT DETECTED
Benzodiazepines: NOT DETECTED
Cocaine: POSITIVE — AB
Opiates: NOT DETECTED

## 2011-03-27 LAB — DIFFERENTIAL
Eosinophils Relative: 0 % (ref 0–5)
Lymphocytes Relative: 34 % (ref 12–46)
Lymphs Abs: 3.2 10*3/uL (ref 0.7–4.0)
Monocytes Relative: 6 % (ref 3–12)

## 2011-03-27 LAB — BASIC METABOLIC PANEL
CO2: 24 mEq/L (ref 19–32)
Calcium: 8.9 mg/dL (ref 8.4–10.5)
Chloride: 102 mEq/L (ref 96–112)
Creatinine, Ser: 0.63 mg/dL (ref 0.50–1.35)
Glucose, Bld: 96 mg/dL (ref 70–99)

## 2011-03-27 MED ORDER — ALUM & MAG HYDROXIDE-SIMETH 200-200-20 MG/5ML PO SUSP: 30.0000 mL | ORAL | Status: DC | PRN

## 2011-03-27 MED ORDER — ACETAMINOPHEN 325 MG PO TABS: 650.0000 mg | ORAL_TABLET | ORAL | Status: DC | PRN

## 2011-03-27 MED ORDER — LORAZEPAM 2 MG/ML IJ SOLN: 1.0000 mg | Freq: Four times a day (QID) | INTRAMUSCULAR | Status: DC | PRN

## 2011-03-27 MED ORDER — ONDANSETRON HCL 4 MG PO TABS
4.0000 mg | ORAL_TABLET | Freq: Three times a day (TID) | ORAL | Status: DC | PRN
  Administered 2011-03-28: 4 mg via ORAL
  Filled 2011-03-27: qty 1

## 2011-03-27 MED ORDER — THIAMINE HCL 100 MG/ML IJ SOLN: 100.0000 mg | Freq: Every day | INTRAMUSCULAR | Status: DC

## 2011-03-27 MED ORDER — FOLIC ACID 1 MG PO TABS
1.0000 mg | ORAL_TABLET | Freq: Every day | ORAL | Status: DC
  Administered 2011-03-27: 1 mg via ORAL
  Filled 2011-03-27: qty 1

## 2011-03-27 MED ORDER — ZOLPIDEM TARTRATE 5 MG PO TABS
5.0000 mg | ORAL_TABLET | Freq: Every evening | ORAL | Status: DC | PRN
  Administered 2011-03-27: 5 mg via ORAL
  Filled 2011-03-27: qty 1

## 2011-03-27 MED ORDER — IBUPROFEN 200 MG PO TABS: 400.0000 mg | ORAL_TABLET | Freq: Three times a day (TID) | ORAL | Status: DC | PRN

## 2011-03-27 MED ORDER — LORAZEPAM 1 MG PO TABS
1.0000 mg | ORAL_TABLET | Freq: Three times a day (TID) | ORAL | Status: DC | PRN
  Filled 2011-03-27: qty 1

## 2011-03-27 MED ORDER — LORAZEPAM 1 MG PO TABS
1.0000 mg | ORAL_TABLET | Freq: Four times a day (QID) | ORAL | Status: DC | PRN
  Administered 2011-03-27: 1 mg via ORAL
  Filled 2011-03-27: qty 1

## 2011-03-27 MED ORDER — VITAMIN B-1 100 MG PO TABS
100.0000 mg | ORAL_TABLET | Freq: Every day | ORAL | Status: DC
  Administered 2011-03-27: 100 mg via ORAL
  Filled 2011-03-27: qty 1

## 2011-03-27 MED ORDER — NICOTINE 21 MG/24HR TD PT24: 21.0000 mg | MEDICATED_PATCH | Freq: Every day | TRANSDERMAL | Status: DC | PRN

## 2011-03-27 MED ORDER — THERA M PLUS PO TABS
1.0000 | ORAL_TABLET | Freq: Every day | ORAL | Status: DC
  Administered 2011-03-27: 20:00:00 via ORAL
  Filled 2011-03-27: qty 1

## 2011-03-27 NOTE — ED Notes (Signed)
One bag locked in activity room 

## 2011-03-27 NOTE — ED Notes (Signed)
Pt admitted to psych ED. Tearful at times. Oriented to unit. Verbalized understanding. Will cont. To monitor.

## 2011-03-27 NOTE — ED Notes (Signed)
Security wanded patient and went through patient belongings bag. Paper scrubs do not fit the patient, so he is in two X-large gowns.

## 2011-03-27 NOTE — ED Notes (Signed)
Patient is resting comfortably. 

## 2011-03-27 NOTE — ED Notes (Addendum)
Please notify pt's sister at [phone] Kathi Ludwig ) if pt is transferred to [facility] or another facilty.

## 2011-03-27 NOTE — ED Notes (Signed)
Pt states "I just don't want to be here anymore, I drink 2-3 fifths a day and do crack"

## 2011-03-27 NOTE — ED Provider Notes (Addendum)
History     CSN: [id number] Arrival date & time: 03/27/2011  3:59 PM   Chief Complaint  Patient presents with  . Medical Clearance    HPI Pt was seen at 1625.  Per pt and his family, c/o gradual onset and worsening of persistent polysubstance abuse for the past year.  Pt has been using crack and drinking alcohol to excess "for a really long time."  States he had been clean from 2007-2011, but started using again last year after several death's in his family.  Pt's LD crack yesterday, etoh today "because I was getting shakey."  Pt is here requesting detox.  States he had vague SI last night, but denies SI now.  Denies SA, no HI, no hallucinations, no CP/SOB, no abd pain, no N/V/D.   Past Medical History  Diagnosis Date  . Sleep apnea   . Anxiety   . Arthritis   . CHF (congestive heart failure)   . Substance abuse   . Depression   . GERD (gastroesophageal reflux disease)     Past Surgical History  Procedure Date  . Lumbar laminectomy 1994, 2010  . Tibia fracture surgery 2006    hardware  . Knee arthroscopy 2011    left  . Tonsillectomy 1963    Family History  Problem Relation Age of Onset  . Colon cancer Sister   . Stomach cancer Neg Hx   . Rectal cancer Neg Hx     History  Substance Use Topics  . Smoking status: Current Everyday Smoker -- 1.0 packs/day    Types: Cigarettes  . Smokeless tobacco: Not on file  . Alcohol Use: Yes     DRINKS 1/2 PINT LIQUOR DAILY    Review of Systems ROS: Statement: All systems negative except as marked or noted in the HPI; Constitutional: Negative for fever and chills. ; ; Eyes: Negative for eye pain, redness and discharge. ; ; ENMT: Negative for ear pain, hoarseness, nasal congestion, sinus pressure and sore throat. ; ; Cardiovascular: Negative for chest pain, palpitations, diaphoresis, dyspnea and peripheral edema. ; ; Respiratory: Negative for cough, wheezing and stridor. ; ; Gastrointestinal: Negative for nausea, vomiting, diarrhea  and abdominal pain, blood in stool, hematemesis, jaundice and rectal bleeding. . ; ; Genitourinary: Negative for dysuria, flank pain and hematuria. ; ; Musculoskeletal: Negative for back pain and neck pain. Negative for swelling and trauma.; ; Skin: Negative for pruritus, rash, abrasions, blisters, bruising and skin lesion.; ; Neuro: Negative for headache, lightheadedness and neck stiffness. Negative for weakness, altered level of consciousness , altered mental status, extremity weakness, paresthesias, involuntary movement, seizure and syncope.  Psych:  No SI, no SA, no HI, no hallucinations.   Allergies  Penicillins and Sulfamethoxazole  Home Medications   Current Outpatient Rx  Name Route Sig Dispense Refill  . BUPROPION HCL ER (SR) 150 MG PO TB12 Oral Take 1 tablet (150 mg total) by mouth 2 (two) times daily. 90 tablet 0    NEEDS OV  . FUROSEMIDE 40 MG PO TABS Oral Take 40 mg by mouth daily.     Marland Kitchen HYPROMELLOSE 2.5 % OP SOLN Both Eyes Place 1 drop into both eyes daily as needed.      Marland Kitchen OMEPRAZOLE 20 MG PO CPDR Oral Take 20 mg by mouth daily.      Marland Kitchen POTASSIUM CHLORIDE CRYS CR 20 MEQ PO TBCR Oral Take 20 mEq by mouth daily.      . TRAZODONE HCL 100 MG PO TABS Oral  Take 1 tablet (100 mg total) by mouth at bedtime. 30 tablet 0  . ONE-DAILY MULTI VITAMINS PO TABS Oral Take 1 tablet by mouth daily.        BP 166/88  Pulse 101  Temp(Src) 97.9 F (36.6 C) (Oral)  Resp 20  SpO2 99%  Physical Exam 1630: Physical examination:  Nursing notes reviewed; Vital signs and O2 SAT reviewed;  Constitutional: Well developed, Well nourished, Well hydrated, In no acute distress; Head:  Normocephalic, atraumatic; Eyes: EOMI, PERRL, No scleral icterus; ENMT: Mouth and pharynx normal, Mucous membranes moist; Neck: Supple, Full range of motion, No lymphadenopathy; Cardiovascular: Regular rate and rhythm, No murmur, rub, or gallop; Respiratory: Breath sounds clear & equal bilaterally, No rales, rhonchi, wheezes,  or rub, Normal respiratory effort/excursion; Chest: Nontender, Movement normal; Extremities: Pulses normal, No tenderness, No edema, No calf edema or asymmetry.; Neuro: AA&Ox3, Major CN grossly intact.  No gross focal motor or sensory deficits in extremities.; Skin: Color normal, Warm, Dry; Psych:  Tearful, no SI.   ED Course  Procedures    MDM  MDM Reviewed: nursing note and vitals Interpretation: labs   Results for orders placed during the hospital encounter of 03/27/11  URINE RAPID DRUG SCREEN (HOSP PERFORMED)      Component Value Range   Opiates NONE DETECTED  NONE DETECTED    Cocaine POSITIVE (*) NONE DETECTED    Benzodiazepines NONE DETECTED  NONE DETECTED    Amphetamines NONE DETECTED  NONE DETECTED    Tetrahydrocannabinol NONE DETECTED  NONE DETECTED    Barbiturates NONE DETECTED  NONE DETECTED   ETHANOL      Component Value Range   Alcohol, Ethyl (B) 164 (*) 0 - 11 (mg/dL)  BASIC METABOLIC PANEL      Component Value Range   Sodium 140  135 - 145 (mEq/L)   Potassium 3.8  3.5 - 5.1 (mEq/L)   Chloride 102  96 - 112 (mEq/L)   CO2 24  19 - 32 (mEq/L)   Glucose, Bld 96  70 - 99 (mg/dL)   BUN 5 (*) 6 - 23 (mg/dL)   Creatinine, Ser 0.45  0.50 - 1.35 (mg/dL)   Calcium 8.9  8.4 - 40.9 (mg/dL)   GFR calc non Af Amer >90  >90 (mL/min)   GFR calc Af Amer >90  >90 (mL/min)  CBC      Component Value Range   WBC 9.6  4.0 - 10.5 (K/uL)   RBC 5.80  4.22 - 5.81 (MIL/uL)   Hemoglobin 16.9  13.0 - 17.0 (g/dL)   HCT 81.1  91.4 - 78.2 (%)   MCV 86.6  78.0 - 100.0 (fL)   MCH 29.1  26.0 - 34.0 (pg)   MCHC 33.7  30.0 - 36.0 (g/dL)   RDW 95.6  21.3 - 08.6 (%)   Platelets 294  150 - 400 (K/uL)  DIFFERENTIAL      Component Value Range   Neutrophils Relative 60  43 - 77 (%)   Neutro Abs 5.7  1.7 - 7.7 (K/uL)   Lymphocytes Relative 34  12 - 46 (%)   Lymphs Abs 3.2  0.7 - 4.0 (K/uL)   Monocytes Relative 6  3 - 12 (%)   Monocytes Absolute 0.6  0.1 - 1.0 (K/uL)   Eosinophils Relative 0   0 - 5 (%)   Eosinophils Absolute 0.0  0.0 - 0.7 (K/uL)   Basophils Relative 0  0 - 1 (%)   Basophils Absolute 0.0  0.0 - 0.1 (K/uL)    7:06 PM:  T/C to ACT team, case discussed, including:  HPI, pertinent PM/SHx, VS/PE, dx testing, ED course and treatment.  Agreeable to eval in ED.     Samuel Jester M   Laray Anger, DO 03/28/11 0201  Patient placed on cpap overnight as he has history of sleep apnea and has lost his machine.  Pt placed on ciwa by Dr Clarene Duke.  Awaiting evaluation and placement.  Olivia Mackie, MD 03/28/11 [phone]

## 2011-03-27 NOTE — ED Notes (Signed)
NWG:[id number]<HY> Expected date:03/27/11<BR> Expected time: 3:57 PM<BR> Means of arrival:Police<BR> Comments:<BR>

## 2011-03-28 ENCOUNTER — Inpatient Hospital Stay (HOSPITAL_COMMUNITY)
Admission: AD | Admit: 2011-03-28 | Discharge: 2011-04-03 | DRG: 897 | Disposition: A | Discharge status: Home / Self Care | Payer: 59 | Source: Ambulatory Visit | Attending: Psychiatry | Admitting: Psychiatry

## 2011-03-28 ENCOUNTER — Encounter (HOSPITAL_COMMUNITY): Payer: Self-pay | Admitting: *Deleted

## 2011-03-28 DIAGNOSIS — F329 Major depressive disorder, single episode, unspecified: Secondary | ICD-10-CM

## 2011-03-28 DIAGNOSIS — M129 Arthropathy, unspecified: Secondary | ICD-10-CM

## 2011-03-28 DIAGNOSIS — Z880 Allergy status to penicillin: Secondary | ICD-10-CM

## 2011-03-28 DIAGNOSIS — E669 Obesity, unspecified: Secondary | ICD-10-CM

## 2011-03-28 DIAGNOSIS — F1020 Alcohol dependence, uncomplicated: Principal | ICD-10-CM

## 2011-03-28 DIAGNOSIS — Z882 Allergy status to sulfonamides status: Secondary | ICD-10-CM

## 2011-03-28 DIAGNOSIS — F607 Dependent personality disorder: Secondary | ICD-10-CM

## 2011-03-28 DIAGNOSIS — G4730 Sleep apnea, unspecified: Secondary | ICD-10-CM

## 2011-03-28 DIAGNOSIS — R45851 Suicidal ideations: Secondary | ICD-10-CM

## 2011-03-28 DIAGNOSIS — F1120 Opioid dependence, uncomplicated: Secondary | ICD-10-CM

## 2011-03-28 DIAGNOSIS — F1420 Cocaine dependence, uncomplicated: Secondary | ICD-10-CM

## 2011-03-28 DIAGNOSIS — G8929 Other chronic pain: Secondary | ICD-10-CM

## 2011-03-28 DIAGNOSIS — Z79899 Other long term (current) drug therapy: Secondary | ICD-10-CM

## 2011-03-28 DIAGNOSIS — F339 Major depressive disorder, recurrent, unspecified: Secondary | ICD-10-CM

## 2011-03-28 DIAGNOSIS — Z7982 Long term (current) use of aspirin: Secondary | ICD-10-CM

## 2011-03-28 DIAGNOSIS — F411 Generalized anxiety disorder: Secondary | ICD-10-CM

## 2011-03-28 DIAGNOSIS — I509 Heart failure, unspecified: Secondary | ICD-10-CM

## 2011-03-28 DIAGNOSIS — F609 Personality disorder, unspecified: Secondary | ICD-10-CM

## 2011-03-28 DIAGNOSIS — F1994 Other psychoactive substance use, unspecified with psychoactive substance-induced mood disorder: Secondary | ICD-10-CM

## 2011-03-28 DIAGNOSIS — K219 Gastro-esophageal reflux disease without esophagitis: Secondary | ICD-10-CM

## 2011-03-28 MED ORDER — THERA M PLUS PO TABS
1.0000 | ORAL_TABLET | Freq: Every day | ORAL | Status: DC
  Administered 2011-03-28 – 2011-04-03 (×7): 1 via ORAL
  Filled 2011-03-28 (×6): qty 1

## 2011-03-28 MED ORDER — INFLUENZA VIRUS VACC SPLIT PF IM SUSP
0.5000 mL | Freq: Once | INTRAMUSCULAR | Status: AC
  Administered 2011-03-28: 0.5 mL via INTRAMUSCULAR

## 2011-03-28 MED ORDER — ASPIRIN EC 81 MG PO TBEC
81.0000 mg | DELAYED_RELEASE_TABLET | Freq: Every day | ORAL | Status: DC
  Administered 2011-03-29 – 2011-04-03 (×6): 81 mg via ORAL
  Filled 2011-03-28 (×6): qty 1

## 2011-03-28 MED ORDER — LORAZEPAM 1 MG PO TABS: 0.0000 mg | ORAL_TABLET | Freq: Two times a day (BID) | ORAL | Status: DC

## 2011-03-28 MED ORDER — CHLORDIAZEPOXIDE HCL 25 MG PO CAPS
25.0000 mg | ORAL_CAPSULE | Freq: Three times a day (TID) | ORAL | Status: AC
  Administered 2011-03-29 (×3): 25 mg via ORAL
  Filled 2011-03-28 (×5): qty 1

## 2011-03-28 MED ORDER — PNEUMOCOCCAL VAC POLYVALENT 25 MCG/0.5ML IJ INJ: 0.5000 mL | INJECTION | INTRAMUSCULAR | Status: AC

## 2011-03-28 MED ORDER — FUROSEMIDE 40 MG PO TABS
40.0000 mg | ORAL_TABLET | Freq: Every day | ORAL | Status: DC
  Administered 2011-03-29 – 2011-04-03 (×6): 40 mg via ORAL
  Filled 2011-03-28 (×6): qty 1

## 2011-03-28 MED ORDER — THIAMINE HCL 100 MG/ML IJ SOLN
100.0000 mg | Freq: Once | INTRAMUSCULAR | Status: AC
  Administered 2011-03-28: 100 mg via INTRAMUSCULAR

## 2011-03-28 MED ORDER — CHLORDIAZEPOXIDE HCL 25 MG PO CAPS
25.0000 mg | ORAL_CAPSULE | Freq: Four times a day (QID) | ORAL | Status: AC | PRN
  Filled 2011-03-28: qty 1

## 2011-03-28 MED ORDER — HYPROMELLOSE (GONIOSCOPIC) 2.5 % OP SOLN
1.0000 [drp] | Freq: Every day | OPHTHALMIC | Status: DC | PRN
  Filled 2011-03-28: qty 15

## 2011-03-28 MED ORDER — PANTOPRAZOLE SODIUM 40 MG PO TBEC
40.0000 mg | DELAYED_RELEASE_TABLET | Freq: Every day | ORAL | Status: DC
  Administered 2011-03-28 – 2011-04-03 (×7): 40 mg via ORAL
  Filled 2011-03-28 (×7): qty 1

## 2011-03-28 MED ORDER — HYDROXYZINE PAMOATE 25 MG PO CAPS: 25.0000 mg | ORAL_CAPSULE | Freq: Four times a day (QID) | ORAL | Status: AC | PRN

## 2011-03-28 MED ORDER — BUPROPION HCL ER (SR) 150 MG PO TB12
150.0000 mg | ORAL_TABLET | Freq: Two times a day (BID) | ORAL | Status: DC
  Administered 2011-03-28 – 2011-04-03 (×12): 150 mg via ORAL
  Filled 2011-03-28 (×14): qty 1

## 2011-03-28 MED ORDER — POTASSIUM CHLORIDE CRYS ER 20 MEQ PO TBCR
20.0000 meq | EXTENDED_RELEASE_TABLET | Freq: Every day | ORAL | Status: DC
  Administered 2011-03-29 – 2011-04-03 (×6): 20 meq via ORAL
  Filled 2011-03-28 (×6): qty 1

## 2011-03-28 MED ORDER — NICOTINE 21 MG/24HR TD PT24
21.0000 mg | MEDICATED_PATCH | Freq: Every day | TRANSDERMAL | Status: DC
  Administered 2011-03-28 – 2011-04-03 (×6): 21 mg via TRANSDERMAL
  Filled 2011-03-28 (×10): qty 1

## 2011-03-28 MED ORDER — TRAZODONE HCL 100 MG PO TABS
100.0000 mg | ORAL_TABLET | Freq: Every day | ORAL | Status: DC
  Administered 2011-03-28 – 2011-04-02 (×6): 100 mg via ORAL
  Filled 2011-03-28 (×7): qty 1

## 2011-03-28 MED ORDER — ONDANSETRON 4 MG PO TBDP: 4.0000 mg | ORAL_TABLET | Freq: Four times a day (QID) | ORAL | Status: AC | PRN

## 2011-03-28 MED ORDER — CHLORDIAZEPOXIDE HCL 25 MG PO CAPS
25.0000 mg | ORAL_CAPSULE | ORAL | Status: AC
  Administered 2011-03-30 (×2): 25 mg via ORAL
  Filled 2011-03-28 (×2): qty 1

## 2011-03-28 MED ORDER — LORAZEPAM 1 MG PO TABS
0.0000 mg | ORAL_TABLET | Freq: Four times a day (QID) | ORAL | Status: DC
  Administered 2011-03-28: 2 mg via ORAL
  Filled 2011-03-28: qty 2

## 2011-03-28 MED ORDER — VITAMIN B-1 100 MG PO TABS
100.0000 mg | ORAL_TABLET | Freq: Every day | ORAL | Status: DC
  Administered 2011-03-29 – 2011-04-03 (×6): 100 mg via ORAL
  Filled 2011-03-28 (×7): qty 1

## 2011-03-28 MED ORDER — LOPERAMIDE HCL 2 MG PO CAPS: 2.0000 mg | ORAL_CAPSULE | ORAL | Status: AC | PRN

## 2011-03-28 MED ORDER — HYDROCODONE-ACETAMINOPHEN 5-325 MG PO TABS
1.0000 | ORAL_TABLET | Freq: Four times a day (QID) | ORAL | Status: DC | PRN
  Administered 2011-03-30 – 2011-04-01 (×4): 1 via ORAL
  Filled 2011-03-28 (×5): qty 1

## 2011-03-28 MED ORDER — CHLORDIAZEPOXIDE HCL 25 MG PO CAPS
25.0000 mg | ORAL_CAPSULE | Freq: Four times a day (QID) | ORAL | Status: AC
  Administered 2011-03-28 (×3): 25 mg via ORAL
  Filled 2011-03-28 (×2): qty 1

## 2011-03-28 MED ORDER — ONE-DAILY MULTI VITAMINS PO TABS: 1.0000 | ORAL_TABLET | Freq: Every day | ORAL | Status: DC

## 2011-03-28 MED ORDER — CHLORDIAZEPOXIDE HCL 25 MG PO CAPS
25.0000 mg | ORAL_CAPSULE | Freq: Every day | ORAL | Status: AC
  Administered 2011-03-31: 25 mg via ORAL
  Filled 2011-03-28: qty 1

## 2011-03-28 NOTE — Progress Notes (Signed)
[facility] Group Notes:  (Counselor/Nursing/MHT/Case Management/Adjunct)  03/28/2011 1:08 PM  Type of Therapy:  group therapy  Participation Level:  Did Not Attend    [name] 03/28/2011, 1:08 PM

## 2011-03-28 NOTE — Progress Notes (Signed)
Locking up cigerrets lighter black hat black pouch envelope in a baggie locker 27

## 2011-03-28 NOTE — Progress Notes (Addendum)
Assessment Note   Derrick Mata is an 60 y.o. male. Patient was brought to the ED by his sister after she saw him walking to his fathers rental house. He reported that he was unsure what he would have done if she would not have stopped him. Pt is currently suicidal with vague plan to shot himself. He noted that yesterday he asked his friend for his gun because he did not want to live any longer. Pt reports no past attempts, but reports history of depression. Increased depression, hopelessness, worthlessness, and isolation. Patient uses Alcohol and crack daily. Drinks 1/2 pints per day and uses as much crack as he can afford to purchases. Started back drinking after father and brother in law passed within 1 month of each last November. He has history of drinking and using crack before however last use until last Nov was in 2007. Current stressors: drug use, drinking heavily, relationship issues with his wife, recent loss of father and brother-in-law, and recent financial issues due to drug use. Patient reports having "no life" as he is unable to do things that he use to do because of health issues. Patient is unable to contract for safety at this time.   Patient is seeking detox from substance use and reports that if he does not get help he will die. Writer consulted with EDP who agrees that due to patients withdrawal symptoms (see assessment) and mental health issues he is in need of inpatient treatment for stabilization and detox.   Axis I: Major Depression, Rec, Alcohol Dependence, Crack Dependence  Axis II: Deferred Axis III:  Past Medical History  Diagnosis Date  . Sleep apnea   . Anxiety   . Arthritis   . CHF (congestive heart failure)   . Substance abuse   . Depression   . GERD (gastroesophageal reflux disease)    Axis IV: economic problems, housing problems, problems related to social environment and problems with primary support group Axis V: 31-40 impairment in reality testing  Past  Medical History:  Past Medical History  Diagnosis Date  . Sleep apnea   . Anxiety   . Arthritis   . CHF (congestive heart failure)   . Substance abuse   . Depression   . GERD (gastroesophageal reflux disease)     Past Surgical History  Procedure Date  . Lumbar laminectomy 1994, 2010  . Tibia fracture surgery 2006    hardware  . Knee arthroscopy 2011    left  . Tonsillectomy 1963    Family History:  Family History  Problem Relation Age of Onset  . Colon cancer Sister   . Stomach cancer Neg Hx   . Rectal cancer Neg Hx     Social History:  reports that he has been smoking Cigarettes.  He has been smoking about 1 pack per day. He does not have any smokeless tobacco history on file. He reports that he drinks alcohol. He reports that he does not use illicit drugs.  Allergies:  Allergies  Allergen Reactions  . Penicillins     REACTION: nausea  . Sulfamethoxazole     REACTION: urticaria (hives)    Home Medications:  Medications Prior to Admission  Medication Dose Route Frequency Provider Last Rate Last Dose  . acetaminophen (TYLENOL) tablet 650 mg  650 mg Oral Q4H PRN Laray Anger, DO      . alum & mag hydroxide-simeth (MAALOX/MYLANTA) 200-200-20 MG/5ML suspension 30 mL  30 mL Oral PRN Laray Anger, DO      .  folic acid (FOLVITE) tablet 1 mg  1 mg Oral Daily Laray Anger, DO   1 mg at 03/27/11 2019  . ibuprofen (ADVIL,MOTRIN) tablet 400 mg  400 mg Oral Q8H PRN Laray Anger, DO      . LORazepam (ATIVAN) tablet 1 mg  1 mg Oral Q6H PRN Laray Anger, DO   1 mg at 03/27/11 2019   Or  . LORazepam (ATIVAN) injection 1 mg  1 mg Intravenous Q6H PRN Laray Anger, DO      . LORazepam (ATIVAN) tablet 1 mg  1 mg Oral Q8H PRN Laray Anger, DO      . multivitamins ther. w/minerals tablet 1 tablet  1 tablet Oral Daily Laray Anger, DO      . nicotine (NICODERM CQ - dosed in mg/24 hours) patch 21 mg  21 mg Transdermal Daily PRN Laray Anger, DO      . ondansetron [facility]) tablet 4 mg  4 mg Oral Q8H PRN Laray Anger, DO      . thiamine (VITAMIN B-1) tablet 100 mg  100 mg Oral Daily Laray Anger, DO   100 mg at 03/27/11 2015   Or  . thiamine (B-1) injection 100 mg  100 mg Intravenous Daily Laray Anger, DO      . zolpidem [facility]) tablet 5 mg  5 mg Oral QHS PRN Laray Anger, DO   5 mg at 03/27/11 2256   Medications Prior to Admission  Medication Sig Dispense Refill  . buPROPion (WELLBUTRIN SR) 150 MG 12 hr tablet Take 1 tablet (150 mg total) by mouth 2 (two) times daily.  90 tablet  0  . furosemide (LASIX) 40 MG tablet Take 40 mg by mouth daily.       Marland Kitchen omeprazole (PRILOSEC) 20 MG capsule Take 20 mg by mouth daily.        . potassium chloride SA (K-DUR,KLOR-CON) 20 MEQ tablet Take 20 mEq by mouth daily.        . traZODone (DESYREL) 100 MG tablet Take 1 tablet (100 mg total) by mouth at bedtime.  30 tablet  0  . Multiple Vitamin (MULTIVITAMIN) tablet Take 1 tablet by mouth daily.          OB/GYN Status:  No LMP for male patient.  General Assessment Data Living Arrangements: Spouse/significant other Can pt return to current living arrangement?: Yes Admission Status: Voluntary Is patient capable of signing voluntary admission?: Yes Transfer from: Home Referral Source: MD  Risk to self Suicidal Ideation: Yes-Currently Present Suicidal Intent: Yes-Currently Present Is patient at risk for suicide?: Yes Suicidal Plan?: Yes-Currently Present (plan to shot self) Specify Current Suicidal Plan: had plan yesterday unsure of current plan but wants to give up on life Access to Means: No What has been your use of drugs/alcohol within the last 12 months?: daily Other Self Harm Risks: none Triggers for Past Attempts: None known Intentional Self Injurious Behavior: None Factors that decrease suicide risk: Sense of responsibility to family Family Suicide History: No Recent stressful life event(s):  Financial Problems;Recent negative physical changes;Conflict (Comment);Loss (Comment) Persecutory voices/beliefs?: No Depression: Yes Depression Symptoms: Isolating;Insomnia;Loss of interest in usual pleasures;Guilt;Feeling worthless/self pity Substance abuse history and/or treatment for substance abuse?: Yes (Pt uses crack daily and drinks 1/2 pint daily x's year) Suicide prevention information given to non-admitted patients: Not applicable  Risk to Others Homicidal Ideation: No Thoughts of Harm to Others: No Current Homicidal Intent: No Current Homicidal  Plan: No Access to Homicidal Means: No Identified Victim: none History of harm to others?: No Assessment of Violence: None Noted Violent Behavior Description: none Does patient have access to weapons?: No Criminal Charges Pending?: No Does patient have a court date: No  Mental Status Report Appear/Hygiene: Other (Comment) (appropraite) Eye Contact: Good Motor Activity: Psychomotor retardation Speech: Slurred;Slow Level of Consciousness: Alert Mood: Anxious;Depressed;Helpless;Sad;Worthless, low self-esteem Affect: Anxious;Depressed;Sad Anxiety Level: Moderate Thought Processes: Coherent;Relevant Judgement: Impaired Orientation: Person;Place;Situation Obsessive Compulsive Thoughts/Behaviors: None  Cognitive Functioning Concentration: Decreased Memory: Recent Impaired;Remote Impaired IQ: Average Insight: Poor Impulse Control: Poor Appetite: Fair Weight Loss:  (none) Weight Gain:  (none) Sleep: Decreased Total Hours of Sleep: 2  Vegetative Symptoms: Staying in bed  Prior Inpatient/Outpatient Therapy Prior Therapy: Outpatient Prior Therapy Dates: 80's and 1994 Prior Therapy Facilty/Provider(s): [facility] Reason for Treatment: Detox            Values / Beliefs Cultural Requests During Hospitalization: None Spiritual Requests During Hospitalization: None        Additional Information 1:1 In Past 12  Months?: No CIRT Risk: No Elopement Risk: No Does patient have medical clearance?: Yes  Child/Adolescent Assessment Running Away Risk: Denies Bed-Wetting: Denies Destruction of Property: Denies Cruelty to Animals: Denies Stealing: Denies Rebellious/Defies Authority: Denies Satanic Involvement: Denies Archivist: Denies Problems at [employer]: Denies Gang Involvement: Denies  Disposition:  Disposition Disposition of Patient: Referred to (Pt was referred to [facility]) Type of inpatient treatment program: Adult Patient referred to: Other (Comment) [facility])  On Site Evaluation by:   Reviewed with Physician:     Shara Blazing [facility] 03/28/2011 1:53 AM  Patient has been accepted to [facility] to Dr. Dan Humphreys bed 304-1 Patient's support paperwork has been completed. EDP notified and is in agreement with disposition. EDP will discharge pt to [facility]. Pt nurse notified as well. ALL appropriate paperwork completed and forwarded to [facility] for review.   Ileene Hutchinson , MSW, LCSWA 03/28/2011 8:32 AM

## 2011-03-28 NOTE — ED Notes (Signed)
Pt with RT at bedside to place on cpap. Will continue to monitor.

## 2011-03-28 NOTE — Progress Notes (Signed)
Suicide Risk Assessment  Admission Assessment     Demographic factors:    Current Mental Status:    Loss Factors:    Historical Factors:    Risk Reduction Factors:     CLINICAL FACTORS:   Severe Anxiety and/or Agitation Depression:   Comorbid alcohol abuse/dependence Alcohol/Substance Abuse/Dependencies Previous Psychiatric Diagnoses and Treatments Medical Diagnoses and Treatments/Surgeries  COGNITIVE FEATURES THAT CONTRIBUTE TO RISK:  Thought constriction (tunnel vision)    SUICIDE RISK:   Moderate:  Frequent suicidal ideation with limited intensity, and duration, some specificity in terms of plans, no associated intent, good self-control, limited dysphoria/symptomatology, some risk factors present, and identifiable protective factors, including available and accessible social support.  Patient reports that he was on his way to kill himself somehow when his sister came by and checked on him.  His sister brought him to the [facility].  He is grateful that she did come by.  He now denies any  suicidal or homicidal ideation, hallucinations, illusions, or delusions. Patient engages with good eye contact, is able to focus adequately in a one to one setting, and has clear goal directed thoughts. Patient speaks with a natural conversational volume, rate, and tone. Anxiety was reported at 9 on a scale of 1 the least and 10 the most. Depression was reported at 8.5 to 9 on the same scale. Patient is oriented times 4, recent and remote memory intact. Judgement:Sees the potential fatality that his addiction has caused Insight:able to see that he could have died yesterday Plan:Librium detox, refer to IOP or residential ELOS: 3 to 5 days.    PLAN OF CARE:   Orson Aloe 03/28/2011, 3:54 PM

## 2011-03-28 NOTE — ED Notes (Signed)
Pt in bed resting on cpap. Pt w/o any s/s of pain or distress. Will continue to monitor.

## 2011-03-28 NOTE — ED Notes (Signed)
Pt reports that he has sleep apnea and used to wear bipap but gave it to his father and has not used it since pt reports that he has not been sleeping well only a few min to one hr at a time. Spoke with EDP otter reports to call RT will contact Charge nurse to see if pt can be moved to [employer] [facility] for Bipap

## 2011-03-28 NOTE — ED Notes (Signed)
Report called to [facility] nurse unavaile for report

## 2011-03-28 NOTE — ED Notes (Signed)
Pt. Brought over from the psy-ed at 3:14am. Pt. Belongings locked up behind the nurses station in [place]. Pt. Has 1 belongings bag.

## 2011-03-28 NOTE — ED Notes (Signed)
Report given to Lupita Leash  At [facility]

## 2011-03-28 NOTE — ED Notes (Signed)
Report given to Crystal Rn pt transferred to rm [id number] EDP reports that she will order bipap. One bag of belongings given to [facility]

## 2011-03-28 NOTE — ED Notes (Signed)
Patient up in bathroom.

## 2011-03-28 NOTE — ED Notes (Signed)
Pt oob to the bathroom awaiting pt to come out to give medication

## 2011-03-28 NOTE — Progress Notes (Signed)
Recreation Therapy Group Note  Date: 03/28/2011         Time: 1415      Group Topic/Focus: The focus of the group is on enhancing the patients' ability to cope with stressors by understanding what coping is, why it is important, the negative effects of stress and developing healthier coping skills. Patients asked to complete a fifteen minute plan, outlining three triggers, three supports, and fifteen coping activities.   Participation Level: Did not attend  Participation Quality: Not Applicable  Affect: Not Applicable  Cognitive: Not Applicable   Additional Comments: None.   Derrick Mata 03/28/2011 3:37 PM

## 2011-03-28 NOTE — ED Notes (Signed)
Pt has a produtive cough black in color has hx of smoking

## 2011-03-28 NOTE — ED Notes (Signed)
Pt has no SI at this time

## 2011-03-28 NOTE — ED Notes (Signed)
Pt off cpap. eating at this time.

## 2011-03-28 NOTE — ED Notes (Signed)
Derrick Mata in at bs to assess pt

## 2011-03-28 NOTE — Progress Notes (Signed)
Psychiatric Admission Assessment Adult  Patient Identification:  ATZIN BUCHTA Date of Evaluation:  03/28/2011 Chief Complaint:  MDD REC, ETOH & CRACK DEP History of Present Illness::60 yo MAAM retired after 32 years at [employer] in 2000. First treated for substance abuse 30 years ago. Was sober from 2000-2007 after an admission to [facility] and he wanted to have a happy retirement. Says most recent relapse started after several family deaths Nov-Dec 2011. These people used to visit with him every day. Now he feels useless has no energy can't make his wife happy and wishes he just wasn't here. Denies any actual plans attempts or gestures.  Mood Symptoms:  Appetite Depression Energy Guilt Helplessness Hopelessness Sadness SI Worthlessness Depression Symptoms:  depressed mood (Hypo) Manic Symptoms:   Elevated Mood:  No Irritable Mood:  Yes Grandiosity:  No Distractibility:  No Labiality of Mood:  Yes Delusions:  No Hallucinations:  No Impulsivity:  No Sexually Inappropriate Behavior:  No Financial Extravagance:  No Flight of Ideas:  No  Anxiety Symptoms: Excessive Worry:  Yes Panic Symptoms:  No Agoraphobia:  No Obsessive Compulsive: No  Symptoms: None Specific Phobias:  No Social Anxiety:  No  Psychotic Symptoms:  Hallucinations:  None Delusions:  No Paranoia:  No   Ideas of Reference:  No  PTSD Symptoms: Ever had a traumatic exposure:  No Had a traumatic exposure in the last month:  No Re-experiencing:  None Hypervigilance:  No Hyperarousal:  None Avoidance:  None  Traumatic Brain Injury:  None   Past Psychiatric History: Diagnosis : Crack and alcohol abuse   Hospitalizations:[facility]'s [facility]  Last few years [place] [facility]   Outpatient Care:none   Substance Abuse Care:detoxes  Self-Mutilation:none   Suicidal Attempts:none   Violent Behaviors:none    Past Medical History:   Past Medical History  Diagnosis Date  . Sleep apnea   . Anxiety   .  Arthritis   . CHF (congestive heart failure)   . Substance abuse   . Depression   . GERD (gastroesophageal reflux disease)    History of Loss of Consciousness:  No Seizure History:  No Cardiac History:  Yes Allergies:  [facility] Allergies  Allergen Reactions  . Penicillins     REACTION: nausea  . Sulfamethoxazole     REACTION: urticaria (hives)   Current Medications:  Current Facility-Administered Medications  Medication Dose Route Frequency Provider Last Rate Last Dose  . chlordiazePOXIDE (LIBRIUM) capsule 25 mg  25 mg Oral Q6H PRN Mechele Dawley, NP      . chlordiazePOXIDE (LIBRIUM) capsule 25 mg  25 mg Oral QID Mechele Dawley, NP   25 mg at 03/28/11 1649   Followed by  . chlordiazePOXIDE (LIBRIUM) capsule 25 mg  25 mg Oral TID Mechele Dawley, NP       Followed by  . chlordiazePOXIDE (LIBRIUM) capsule 25 mg  25 mg Oral BH-qamhs Mechele Dawley, NP       Followed by  . chlordiazePOXIDE (LIBRIUM) capsule 25 mg  25 mg Oral Daily Mechele Dawley, NP      . hydrOXYzine (VISTARIL) capsule 25 mg  25 mg Oral Q6H PRN Mechele Dawley, NP      . influenza  inactive virus vaccine (FLUZONE/FLUARIX) injection 0.5 mL  0.5 mL Intramuscular Once Edwin Walker   0.5 mL at 03/28/11 1426  . loperamide (IMODIUM) capsule 2-4 mg  2-4 mg Oral PRN Mechele Dawley, NP      . multivitamins ther.  w/minerals tablet 1 tablet  1 tablet Oral Daily Mechele Dawley, NP   1 tablet at 03/28/11 1428  . nicotine (NICODERM CQ - dosed in mg/24 hours) patch 21 mg  21 mg Transdermal Q0600 Edwin Walker   21 mg at 03/28/11 1650  . ondansetron (ZOFRAN-ODT) disintegrating tablet 4 mg  4 mg Oral Q6H PRN Mechele Dawley, NP      . pneumococcal 23 valent vaccine (PNU-IMMUNE) injection 0.5 mL  0.5 mL Intramuscular Tomorrow-1000 Cranford Mon, RN      . thiamine (B-1) injection 100 mg  100 mg Intramuscular Once Mechele Dawley, NP   100 mg at 03/28/11 1420  . thiamine (VITAMIN B-1) tablet 100 mg  100 mg Oral Daily Mechele Dawley, NP       Facility-Administered Medications Ordered in Other Encounters  Medication Dose Route Frequency Provider Last Rate Last Dose  . DISCONTD: acetaminophen (TYLENOL) tablet 650 mg  650 mg Oral Q4H PRN Laray Anger, DO      . DISCONTD: alum & mag hydroxide-simeth (MAALOX/MYLANTA) 200-200-20 MG/5ML suspension 30 mL  30 mL Oral PRN Laray Anger, DO      . DISCONTD: folic acid (FOLVITE) tablet 1 mg  1 mg Oral Daily Laray Anger, DO   1 mg at 03/27/11 2019  . DISCONTD: ibuprofen (ADVIL,MOTRIN) tablet 400 mg  400 mg Oral Q8H PRN Laray Anger, DO      . DISCONTD: LORazepam (ATIVAN) injection 1 mg  1 mg Intravenous Q6H PRN Laray Anger, DO      . DISCONTD: LORazepam (ATIVAN) tablet 0-4 mg  0-4 mg Oral Q6H Olivia Mackie, MD   2 mg at 03/28/11 0229  . DISCONTD: LORazepam (ATIVAN) tablet 0-4 mg  0-4 mg Oral Q12H Olivia Mackie, MD      . DISCONTD: LORazepam (ATIVAN) tablet 1 mg  1 mg Oral Q8H PRN Laray Anger, DO      . DISCONTD: LORazepam (ATIVAN) tablet 1 mg  1 mg Oral Q6H PRN Laray Anger, DO   1 mg at 03/27/11 2019  . DISCONTD: multivitamins ther. w/minerals tablet 1 tablet  1 tablet Oral Daily Laray Anger, DO      . DISCONTD: nicotine (NICODERM CQ - dosed in mg/24 hours) patch 21 mg  21 mg Transdermal Daily PRN Laray Anger, DO      . DISCONTD: ondansetron [facility]) tablet 4 mg  4 mg Oral Q8H PRN Laray Anger, DO   4 mg at 03/28/11 0229  . DISCONTD: thiamine (B-1) injection 100 mg  100 mg Intravenous Daily Laray Anger, DO      . DISCONTD: thiamine (VITAMIN B-1) tablet 100 mg  100 mg Oral Daily Laray Anger, DO   100 mg at 03/27/11 2015  . DISCONTD: zolpidem (AMBIEN) tablet 5 mg  5 mg Oral QHS PRN Laray Anger, DO   5 mg at 03/27/11 2256    Previous Psychotropic Medications:  Medication Dose  None                       Substance Abuse History in the last 12 months: Substance Age of 1st Use Last Use  Amount Specific Type  Nicotine      Alcohol  Daily -liquor    Cannabis      Opiates      Cocaine  Since 80's     Methamphetamines  LSD      Ecstasy      Benzodiazepines      Caffeine      Inhalants      Others:                         Medical Consequences of Substance Abuse:Has CHF and he attributes some of his obesity   Legal Consequences of Substance Abuse:None   Family Consequences of Substance Abuse: Conflict with wife   Blackouts:  No DT's:  No Withdrawal Symptoms:  Diarrhea and gets tremulous without a certain amount of alcohol  Social History: Current Place of Residence: Home of over 30 years   Place of Birth:   Family Members: Marital Status:  Married 32 years  Children:4 2 are biologic and 2 step  Sons:  Daughters: Relationships: 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 Colon cancer Sister   . Stomach cancer Neg Hx   . Rectal cancer Neg Hx     Mental Status Examination/Evaluation: Objective:  Appearance: Disheveled morbidly obese  Eye Contact::  Good  Speech:  Clear and Coherent  Volume:  Normal  Mood: sad tears up    Affect:  Congruent  Thought Process:  Linear  Orientation:  Full  Thought Content:  Clear rational wants to mend relationship with wife   Suicidal Thoughts:  Yes.  without intent/plan  Homicidal Thoughts:  No  Judgement:  Impaired  Insight:  Fair  Psychomotor Activity:  Decreased  Akathisia:  No  Handed:  Right  AIMS (if indicated):     Assets:  Communication Skills Desire for Improvement Financial Resources/Insurance Housing Leisure Time Social Support    Laboratory/X-Ray Psychological Evaluation(s)      Assessment:  Depression                           Chronic  alcohol and crack abuse   AXIS I Substance Abuse and Substance Induced Mood Disorder  AXIS II Dependent Personality dependent personality  AXIS III Past Medical History  Diagnosis Date  . Sleep apnea   . Anxiety   . Arthritis   . CHF (congestive heart failure)   . Substance abuse   . Depression   . GERD (gastroesophageal reflux disease)      AXIS IV problems with primary support group  AXIS V 41-50 serious symptoms   Treatment Plan/Recommendations: Admit for medically supported alcohol detox with use of Low Dose Librium Protocol  Start antidepressant as indicated  Check Thyroid status  Reviewed ED H&P and examined patient myself. Agree with findings and will check thyroid.  Treatment Plan Summary: Daily contact with patient to assess and evaluate symptoms and progress in treatment Medication management  Observation Level/Precautions:  Detox  Laboratory:  TSH T3 T4   Psychotherapy:  Group therapy   Medications: daily medical meds    Routine PRN Medications:  Yes  Consultations:  Nutrition for weight loss and management of CHF  Discharge Concerns:    Other:      Jenee Spaugh,MICKIE D. 11/8/20127:37 PM

## 2011-03-28 NOTE — Progress Notes (Signed)
  60 yo AA male voluntary admission. Reports that he had had increased depression  Since  his father and  Brother-in-law died within a  month a part   last  November and December  2011.  Has been drinking  Moon shine  Daily a  4th to two 5th a day  And smoking crack for the past year. Had been clean/  sober for the past year.

## 2011-03-29 LAB — TSH: TSH: 2.116 u[IU]/mL (ref 0.350–4.500)

## 2011-03-29 LAB — T4, FREE: Free T4: 1.12 ng/dL (ref 0.80–1.80)

## 2011-03-29 LAB — T3: T3, Total: 154 ng/dl (ref 80.0–204.0)

## 2011-03-29 MED ORDER — DULOXETINE HCL 30 MG PO CPEP
30.0000 mg | ORAL_CAPSULE | Freq: Every day | ORAL | Status: DC
  Administered 2011-03-29 – 2011-04-01 (×4): 30 mg via ORAL
  Filled 2011-03-29 (×4): qty 1

## 2011-03-29 MED ORDER — ACETAMINOPHEN 325 MG PO TABS
650.0000 mg | ORAL_TABLET | Freq: Four times a day (QID) | ORAL | Status: DC | PRN
  Administered 2011-04-03: 650 mg via ORAL

## 2011-03-29 MED ORDER — MAGNESIUM HYDROXIDE 400 MG/5ML PO SUSP: 30.0000 mL | Freq: Every day | ORAL | Status: DC | PRN

## 2011-03-29 MED ORDER — ALUM & MAG HYDROXIDE-SIMETH 200-200-20 MG/5ML PO SUSP: 30.0000 mL | ORAL | Status: DC | PRN

## 2011-03-29 MED ORDER — DULOXETINE HCL 30 MG PO CPEP
30.0000 mg | ORAL_CAPSULE | ORAL | Status: AC
  Administered 2011-03-29: 30 mg via ORAL
  Filled 2011-03-29: qty 1

## 2011-03-29 MED ORDER — POLYVINYL ALCOHOL 1.4 % OP SOLN: 1.0000 [drp] | Freq: Every day | OPHTHALMIC | Status: DC | PRN

## 2011-03-29 NOTE — Progress Notes (Signed)
  Met with patient.  Full psychiatric admit note completed by Mallie Darting PA and is in the record.    Derrick Mata reports near attempt at suicide two weeks ago when he asked his elderly friend to 'see' his gun that he knew he carried.  When he got his hands on the gun he held it to his head and intended to pull the trigger, but friend took it away from him.  Yesterday he planned to go to his grandfather's house to find out what he might use to hurt himself, but relatives came by and distracted him.Marland KitchenMarland Kitchen'It was just fate".    He reports he has recently 'messed everything up', and made his wife very unhappy by blowing their budget on cocaine and alcohol.  Stopped attending AA groups in 2004, has gotten off track.  Multiple medical problems are part of his depression.  He is followed by [facility] - pulmonology for OSA with O2 supplementation, family medicine for obesity and DJD, and cardiology for heart failure.   Has not used his CPAP in a long time and sleeps only 2 hrs/night in a recliner.    Discussed scheduling follow up with [facility], and will schedule.

## 2011-03-29 NOTE — Progress Notes (Signed)
  Pt is a 60 yr old male that is depressed in mood. Pt was asleep for the majority of the shift. Meds were brought to the patient and a 1:1 interaction was conducted. Pt was soft spoken in tone and was very sad in affect. Pt stated that he was tired and was catching up on sleep. Pt expressed his thoughts of depression, worthlessness, and hopelessness. Pt sigh as he admitted to being suicidal. Pt does contract for safety at this time. Pt missed group and requested a snack and some moisturizer for his lips. Pt was giving some petroleum for his lips and a light bedtime snack. Pt was thankful and support and availability was extended to the patient. Patient reports some group attendance and has been encouraged to mingle more with others during the day and to decrease the amount of time occupied in his room. Pateint safety remains with q41min checks.

## 2011-03-29 NOTE — Progress Notes (Signed)
  Patient seen in discharge planning group this morning. He became tearful when he described his feeling of hopelessness. He has grown kids and a 62-year-old grandson his grandson lives with him he had 2 deaths that occurred within a month of each other that has been weighing heavily on his mind. He is quite frustrated and feeling quite hopeless and resigned himself to going to long-term treatment so that he can get back into a better groove in a more solid recovery. He has had 7 years her recovery in the past but needs to work harder at keeping himself clean in the future. He has been suffering a great deal from pain and had been on Wellbutrin for depression in the past. I will try him on Cymbalta for pain and depression.  He is on hydrocodone for pain management and will need to find some alternatives to that. Potential alternatives would also be less likely to cause depression.  Patient is rather tearful and hopeless at this time.  we'll need to continue to monitor this as well as his detox.

## 2011-03-29 NOTE — Progress Notes (Signed)
[facility] Adult Inpatient Family/Significant Other Suicide Prevention Education  Suicide Prevention Education:  Education Completed; Derrick Mata (Wife) [phone] or [phone] has been identified by the patient as the family member/significant other with whom the patient will be residing, and identified as the person(s) who will aid the patient in the event of a mental health crisis (suicidal ideations/suicide attempt).  With written consent from the patient, the family member/significant other has been provided the following suicide prevention education, prior to the and/or following the discharge of the patient.  The suicide prevention education provided includes the following:  Suicide risk factors  Suicide prevention and interventions  National Suicide Hotline telephone number  [facility] assessment telephone number  [facility] Emergency Assistance 911  [facility] and/or Residential Mobile Crisis Unit telephone number  Request made of family/significant other to:  Remove weapons (e.g., guns, rifles, knives), all items previously/currently identified as safety concern.    Remove drugs/medications (over-the-counter, prescriptions, illicit drugs), all items previously/currently identified as a safety concern.  The family member/significant other verbalizes understanding of the suicide prevention education information provided.  The family member/significant other agrees to remove the items of safety concern listed above.  Derrick Mata 03/29/2011, 3:42 PM

## 2011-03-29 NOTE — Progress Notes (Signed)
Pt states he is sleeping poorly, but appetite is improving. Pt rates his depression and hopelessness as a 10, and states he feels suicidal "off and on" but contracts for safety. Pt was tearful at the med window this morning, but had difficulty talking about his sadness. He did say "I feel guilty". Pt rates his pain as a 8. Physically, he c/o tremors/chilling/agitation. Pt would like long term treatment greater than 28 days. Pt has had 4 yr sobriety. To begin Cymbalta per Treatment Team Team/MD.

## 2011-03-29 NOTE — Progress Notes (Signed)
[facility] Group Notes:  (Counselor/Nursing/MHT/Case Management/Adjunct)  03/29/2011 3:13 PM  Type of Therapy:  Group Processing at 1:15 PM   Participation Level:  None  Clide Dales 03/29/2011, 3:13 PM

## 2011-03-29 NOTE — Progress Notes (Signed)
Interdisciplinary Treatment Plan Update (Adult)  Date:  03/29/2011 Time Reviewed:  10:20 AM  Progress in Treatment: Attending groups: Yes. Participating in groups:  Yes. Taking medication as prescribed:  Yes. Tolerating medication:  Yes. Family/Significant othe contact made:  No, will contact:  Santina Evans intern will be collecting Patient understands diagnosis:  Yes. Discussing patient identified problems/goals with staff:  Yes. Medical problems stabilized or resolved:  Yes. Denies suicidal/homicidal ideation: Yes. Issues/concerns per patient self-inventory:  Yes. Other:  New problem(s) identified: No, Describe:  none identified   Reason for Continuation of Hospitalization: Anxiety Depression Medication stabilization Suicidal ideation  Interventions implemented related to continuation of hospitalization: Medication stablization, safety checks every 15 min  Additional comments:  Estimated length of stay: 3-5 days  Discharge Plan: Social worker will refer for long term treatment  New goal(s):  Review of initial/current patient goals per problem list:   1.  Goal(s): Reduce depression  Met:  No  Target date:discharge  As evidenced by: reduced depression from 10 to 3  2.  Goal (s): Eliminate SI  Met:  No  Target date:discharge date  As evidenced by: Pt denies SI  3.  Goal(s): Pt will substance use  Met:  No  Target date:discharge date  As evidenced [id number] worker will explore treatment options   4.  Goal(s):  Met:  No  Target date:  As evidenced by:  Attendees: Patient:   11/9/201210:20 AM  Family:   11/9/201210:20 AM  Physician:  Orson Aloe, MD 11/9/201210:20 AM  Nursing:   Hanover Cellar, RN 11/9/201210:20 AM  Case Manager:  Richelle Ito LCSW 11/9/201210:20 AM  Counselor:  Vanetta Mulders LPCA 11/9/201210:20 AM  Other:  Jeannette How LCSW 11/9/201210:20 AM  Other:  Ferrel Logan LCSWA 11/9/201210:20 AM  Other:  Reyes Ivan LCSWA  11/9/201210:20 AM  Other:  Tanya Nones, intern 11/9/201210:20 AM   Scribe for Treatment Team:   [name], 03/29/2011, 10:20 AM

## 2011-03-29 NOTE — Progress Notes (Signed)
[facility] Group Notes:  (Counselor/Nursing/MHT/Case Management/Adjunct)  03/29/2011 2:22 PM  Type of Therapy:  Group Therapy  Participation Level:  Minimal  Participation Quality:  Appropriate  Affect:  Blunted  Cognitive:  Appropriate  Insight:  Good  Engagement in Group:  Limited  Engagement in Therapy:  Limited  Modes of Intervention:  Problem-solving, Support and exploration   Summary of Progress/Problems: Lacy processed feelings and thoughts related to relapse and recovery. Pt was pulled out by nursing for the 1st half of the group. Trenell believes recovery will led to a more simple lifestyle.   [name] 03/29/2011, 2:22 PM

## 2011-03-29 NOTE — Progress Notes (Signed)
Pt attended morning d/c planning group. Says that it was his sister and brother in law who thought it was a good idea for him to come to the hospital for help. Pt says that he has been wanting to "do away with himself." His dad and brother in law recently passed within a month of each other and he says that he has progressively gotten worse ever since. He stated that over the last 12 years things have been complicated with his wife and he doesn't think that he can make her happy . Pt has received help in the past for his alcoholism and identified a sobriety period of 7 years.  Has been psychiatrically hospitalized one time in 80's, [facility] twice.  Presents as despondent, flat affect, tearful, hopeless.

## 2011-03-30 NOTE — Progress Notes (Signed)
NURSING:  Pt sitting in dayroom watching TV, no distress noted.  Pt pleasant on approach, interacting appropriately on unit.  Some right knee pain but wishes to take pain medication at bedtime with scheduled nighttime meds.  Denies SI/HI/hallucinations.  Support and encouragement offered, will continue to monitor.

## 2011-03-30 NOTE — Progress Notes (Signed)
  Pt is a 60 yr old male that is blunted in affect and depressed in mood. Pt still reports depression  from Thursday but a decrease from his previous hospital days. Pt states that he is sad that he can no longer make his wife happy, despite his wife trying to hide the fact that she is not happy. Pt reports that it all started in 2000 when his health started declining.  One particular issue is that he cant be treated for his erectile dysfunction due to his medical regimen for CHF, Obesity and other medical issues. This has interfere with his marriage of 30 plus years, and he mentions that his wife has already accepted enough from him.  Pt is passive for SI and contracts. He states that he feels useless to the world. Pt has been offered words of support and encouraged to request me for additional support as needed. Patient's safety remains with q69min checks.

## 2011-03-30 NOTE — Progress Notes (Signed)
[facility] Group Notes:  (Counselor/Nursing/MHT/Case Management/Adjunct)  03/30/2011 1:15PM  Type of Therapy:  Counseling / Dance/Movement Therapy  Participation Level:  Active  Participation Quality:  Appropriate  Affect:  Appropriate  Cognitive:  Appropriate  Insight:  Limited  Engagement in Group:  Good  Engagement in Therapy:  Good  Modes of Intervention:  Activity, Clarification, Education, Socialization and Support  Summary of Progress/Problems: Group members shared healthy and unhealthy ways of coping when feeling stuck, as well as how it applies to self-sabatoging and enabling in process of recovery from addiction. Pt. Stated that playing computer games is a healthier coping mechanism than drinking, which is unhealthy. Group also discussed a pattern of recovery that structured, linear, and sequential. Pt. Said they were thankful to have humility today.     Alizia Greif 03/30/2011, 3:19 PM

## 2011-03-30 NOTE — Progress Notes (Signed)
  03-30-11 pt is having some passive suicide ideation  but is able to contract. He denies any av hallucinations. He only complaint today has been pain in his right knee and he was given hydrocodone prn for the pain and it was effective. On his inventory sheet he wrote sleep poor, appetite good, energy low, attention improving, depression at an 8 and hopelessness at 9. Withdrawal symptoms are tremors and chilling. He stated his vision was blurred. Rn will continue to monitor and every 15 min checks continue.

## 2011-03-30 NOTE — Progress Notes (Signed)
Pt. attended and participated in aftercare planning group. Pt. accepted information on suicide prevention, warning signs to look for with suicide and crisis line numbers to use. The pt. agreed to call crisis line numbers if having warning signs or having thoughts of suicide. Pt. listed their current anxiety level as a 9 for he has "lots of anxiety"

## 2011-03-30 NOTE — Progress Notes (Signed)
[facility] Group Notes:  (Counselor/Nursing/MHT/Case Management/Adjunct)  03/30/2011 7:31 PM  Type of Therapy:  Psychoeducational Skills  Participation Level:  Minimal  Participation Quality:  Drowsy  Affect:  Blunted  Cognitive:  Appropriate and Oriented  Insight:  Good  Engagement in Group:  Limited  Engagement in Therapy:  n/a  Modes of Intervention:  Activity, Education and Support  Summary of Progress/Problems: Group focused on communication. Derrick Mata was drowsy but was able to participate some in discussion about why communication is important, communication do's and don'ts, and reviewed "I feel" statements. Group read an example letter out loud using "I feel" statements.  Pt. was given assignment to dissect a personal conflict and how communication went wrong, and what they could change to improve it.  Derrick Mata 03/30/2011 7:35 PM    Derrick Mata, Eduard Clos 03/30/2011, 7:31 PM

## 2011-03-31 NOTE — Progress Notes (Signed)
Pt has gotten up and attended groups today, but otherwise stays in room in bed.  Pt feels that his days and nights are somehow mixed up.   No acute distress noted, no complaints other than chronic right knee pain.  Pt states that a pain scale level of 4 or 5 is the lowest it ever gets and then he knows he needs to take another pain pill.  Pt pleasant on approach.  Denies SI/HI/hallucinations.  Support and encouragement offered, pain medication given as ordered.  Will continue to monitor.

## 2011-03-31 NOTE — Progress Notes (Addendum)
  03-31-11  Pt has been very polite cooperative today. He has gone to meals, taken his meds and came to group. The pt is having trouble staying awake in group and state he has "his night and days mixed up".   His inventory sheet stated he slept well, appetite good, energy low, attention poor depressioon and hopelessness at 9.  W/d symptoms a slight tremor. Passive suicide ideation but able to contract. Has had some right knee pain and given prn med and they were effective. RN will monitor and safety checks continue every 15 minutes.

## 2011-03-31 NOTE — Progress Notes (Signed)
[facility] Group Notes:  (Counselor/Nursing/MHT/Case Management/Adjunct)  03/31/2011 8:47 PM  Type of Therapy:  Psychoeducational Skills  Participation Level:  Minimal  Participation Quality:  Appropriate and Drowsy  Affect:  Blunted  Cognitive:  Appropriate and Oriented  Insight:  Good  Engagement in Group:  Limited  Engagement in Therapy:  n/a  Modes of Intervention:  Activity, Education, Orientation, Socialization and Support  Summary of Progress/Problems: Group focused on support systems. Derrick Mata participated in game asking questions about self and peers. Group discussed who their support people/systems are and what they offer. Group was given handout and discussed how quality time can be used to maintain healthy support relationships. Pt. was given homework assignment to evaluate a current relationship and create an action plan to improve it.   [name] 03/31/2011, 8:47 PM

## 2011-03-31 NOTE — Progress Notes (Signed)
[facility] Group Notes:  (Counselor/Nursing/MHT/Case Management/Adjunct)  03/31/2011 1:15PM  Type of Therapy:  Counseling Group / Dance/Movement Therapy  Participation Level:  Minimal  Participation Quality:  Drowsy, Redirectable and Sharing  Affect:  Appropriate  Cognitive:  Oriented  Insight:  Limited  Engagement in Group:  Limited  Engagement in Therapy:  Limited  Modes of Intervention:  Clarification, Education, Problem-solving, Socialization and Support  Summary of Progress/Problems: Group discussed sources of support in their lives and the difference between what makes supports healthy or unhealthy, as well as how to identify them. Pt. shared  that his first daughter gives support because she is "more capable to understand." Group also focused setting safe boundaries with themselves and with others when encountering triggers by saying "no" or "stop". Pt shared personal experience of avoiding going to a friend's home or driving down a certain road where a liquor store is located when trying to remain sober. Throughout the session, pt. would fall asleep, but he would wake up when addressed directly.        Odilon Cass 03/31/2011, 3:10 PM

## 2011-03-31 NOTE — Progress Notes (Signed)
[facility] MD Progress Note  03/31/2011 9:46 AM  Diagnosis:  Alcohol dependence -withdrawing w/o unusual symptoms                     Depression   ADL's:  Intact  Sleep:  Yes,  AEB: reports he had the best sleep he has had in years last night.  Appetite:  Yes,  AEB:  Suicidal Ideation:   Plan:  No  Intent:  No  Means:  No  Homicidal Ideation:   Plan:  No  Intent:  No  Means:  No  AEB (as evidenced by):  Mental Status: General Appearance Luretha Murphy:  Casual Eye Contact:  Good Motor Behavior:  Normal walks unaided obesity makes him a little slow Speech:  Normal Level of Consciousness:  Alert Mood:  Worthless depressed  Affect:  Depressed Anxiety Level:  Moderate Thought Process:  Coherent Thought Content:  Paranoid Ideation says he feels different this time  Perception:  Normal Judgment:  Fair Insight:  Present Cognition:  Orientation time, place and person Sleep:    Vital Signs:Blood pressure 168/80, pulse 98, temperature 97.4 F (36.3 C), temperature source Oral, resp. rate 24, height 6\' 1"  (1.854 m), weight 209.562 kg (462 lb), SpO2 96.00%.  Lab Results: No results found for this or any previous visit (from the past 48 hour(s)).  Physical Findings: Up dressed and active in [place] Treatment Plan Summary: No changes   Plan: CM to look for longer inpatient care setting. Doesn't feel he can return home. Is afraid he will relapse if not in environment where he cannot  get drugs/alcohol.   Elynn Patteson,MICKIE D. 03/31/2011, 9:46 AM

## 2011-04-01 MED ORDER — DULOXETINE HCL 20 MG PO CPEP
40.0000 mg | ORAL_CAPSULE | Freq: Every day | ORAL | Status: DC
  Administered 2011-04-02 – 2011-04-03 (×2): 40 mg via ORAL
  Filled 2011-04-01 (×3): qty 2

## 2011-04-01 MED ORDER — NAPROXEN 500 MG PO TABS
500.0000 mg | ORAL_TABLET | Freq: Two times a day (BID) | ORAL | Status: DC
  Administered 2011-04-01 – 2011-04-03 (×4): 500 mg via ORAL
  Filled 2011-04-01 (×6): qty 1

## 2011-04-01 NOTE — Progress Notes (Signed)
Pt attended AM group.  Good participation.  States he is still having "crazy thoughts" and clarifies that these are thoughts of self harm. Helpless, hopeless. "I've let everyone down.  There's no reason for me to be around anymore."  Still focused on long term program, though I explain there really is not such a thing for him.  Asked if his wife had visited yet.  He has not given her the code.  Asked if we could call her together. Stated he would think about it.  Found pt meeting with DR this afternoon.  Asked him to come call wife with me.  He agreed. She spoke candidly about his struggles with SA, but also made it clear that she is supportive and wants him to get better.  He revealed to her that he does not trust himself, meaning that he has thoughts of self harm.  With more questioning, he was willing to say that he feels worthless because of his inability to have sex with her.  She assured him that he was making a bigger issue out of that than it really is.  She will come see him tonite.  She had been waiting for him to call, and even today he was saying that he did not want to burden her.  She pushed him on whether he wanted her to come or not.  The best he could do was "I guess it would be alright if you want to come." He also checked in briefly with his 52 yo grandson.After the call, we talked about [name]'s ability to forgive himself as a way of moving on.  We talked about ways to stay occupied and productive at home, including going to wellness academy and AA groups.  Derrick Mata seemed a bit brighter at the end of our time together.  Will check in with him in AM to see how he is doing.

## 2011-04-01 NOTE — Progress Notes (Addendum)
  Patient seen in discharge planning group where he reported that he is still here and is struggling with suicidal thoughts off and on.   After group he asked why his blood pressure is still up.  He was informed that in withdrawal several things do not go well.  In consult room patient was asked what he is willing to do different this time.  He was not sure.  He recounted several points made in some of the groups.  He was again challenged to consider what he will do different and keep ding different in the future.  He is unsure what he is willing to do just yet.  He is asking for a residential program that is longer than 30 day.  CM is making a phone call to his wife to see what she sees is necessary at this time.

## 2011-04-01 NOTE — Progress Notes (Signed)
  Pt. Is med compliant and  Attending Groups.He rates his Depression and Hopelessness as 8/10 and still has suicidal thoughts off and on.He does contract for safety.His Goal is to stay off mind altering drugs and to attend A/A and or N/A. Encouraged and supported.

## 2011-04-01 NOTE — Progress Notes (Signed)
[facility] Group Notes:  (Counselor/Nursing/MHT/Case Management/Adjunct)  04/01/2011 4:54 PM  Type of Therapy:  Group Therapy  Participation Level:  Active  Participation Quality:  Inattentive and Redirectable  Affect:  Depressed and Flat  Cognitive:  Appropriate  Insight:  Limited  Engagement in Group:  Good  Engagement in Therapy:  Limited  Modes of Intervention:  Problem-solving, Role-play and Support  Summary of Progress/Problems: Derrick Mata shared with the group that one of the main reason he wants to recover is for his 58 year old grandson. Derrick Mata states he is aware he needs to change his thoughts but fears if he does not get long term treatment ie 3 months or longer he can not be trusted and may harm himself. Pt feels like freedom would be bad for him. Pt also shared fear over calling wife as he feels she no longer trusts him and will say hurtful and negative things.    [name] 04/01/2011, 4:54 PM

## 2011-04-01 NOTE — Progress Notes (Signed)
[facility] Group Notes:  (Counselor/Nursing/MHT/Case Management/Adjunct)  04/01/2011 2:50 PM  Type of Therapy:  group therapy  Participation Level:  Active  Participation Quality:  Appropriate and Sharing  Affect:  Appropriate  Cognitive:  Appropriate  Insight:  Good  Engagement in Group:  Good  Engagement in Therapy:  Good  Modes of Intervention:  Problem-solving, Support and exploration  Summary of Progress/Problems: Derrick Mata was able to share that his biggest barrier to recovery is feeling worthless due to the negative things he hears from others. Pt stated he feels weak as he allowed the death of two close family members act as a trigger. Pt was able to share about his long halls of being sober.   [name] 04/01/2011, 2:50 PM

## 2011-04-01 NOTE — Progress Notes (Signed)
Pt sitting in dayroom, watching TV. Appears flat and depressed. Calm and cooperative with assessment. Open and spontaneous in conversation. A/Ox4. No acute distress noted or reported. States he has had an ok day. States he continues to be depressed r/t his medical condition and need for surgery. States he has been told he needs to lose 200+ lbs in order to be a candidate for Right knee replacement. States it is getting more difficult to get exercise r/t his CHF, knee pain and weight. Support and encouragement provided.  Endorses tolerable pain in right knee and denied need for prn. Told to call writer if pain increases or he changes his mind about prn. Otherwise no additional questions or concerns expressed. Denies SI/HI/AVH and contracts for safety. POC and medications for the shift reviewed and understanding verbalized. Safety has been maintained with Q70minute observation. Will continue current POC.

## 2011-04-02 MED ORDER — DULOXETINE HCL 20 MG PO CPEP: 40.0000 mg | ORAL_CAPSULE | Freq: Every day | ORAL | Status: DC

## 2011-04-02 MED ORDER — NAPROXEN 500 MG PO TABS: 500.0000 mg | ORAL_TABLET | Freq: Two times a day (BID) | ORAL | Status: AC

## 2011-04-02 MED ORDER — NICOTINE 21 MG/24HR TD PT24: 21.0000 | MEDICATED_PATCH | Freq: Every day | TRANSDERMAL | Status: AC

## 2011-04-02 MED ORDER — THIAMINE HCL 100 MG PO TABS: 100.0000 mg | ORAL_TABLET | Freq: Every day | ORAL | Status: AC

## 2011-04-02 NOTE — Progress Notes (Signed)
[facility] Group Notes:  (Counselor/Nursing/MHT/Case Management/Adjunct)  04/02/2011 2:55 PM  Type of Therapy:  Group Therapy  Participation Level:  Active  Participation Quality:  Appropriate and Sharing  Affect:  Appropriate  Cognitive:  Appropriate  Insight:  Good  Engagement in Group:  Good  Engagement in Therapy:  Good  Modes of Intervention:  Problem-solving, Support and exploration  Summary of Progress/Problems: Zollie was able to share that his outlook on life has changed 80% as he no longer feels deep depression and hopeless. Pt stated he assumed the worst about the support of his wife but learned yesterday how much she supports him and has faith in his ability to get back to recovering. Pt stated he feels he is able to have honest talks with his wife and knows that addiction will always be a battle but he has the motivation and supports. Pt able to provide positive feedback to others.   [name] 04/02/2011, 2:55 PM

## 2011-04-02 NOTE — Progress Notes (Signed)
[facility] Group Notes:  (Counselor/Nursing/MHT/Case Management/Adjunct)  04/02/2011 5:07 PM  Type of Therapy:  Group therapy  Participation Level:  Active  Participation Quality:  Appropriate, Sharing and Supportive  Affect:  Appropriate  Cognitive:  Appropriate  Insight:  Good  Engagement in Group:  Good  Engagement in Therapy:  Good  Modes of Intervention:  Problem-solving, Support and exploration  Summary of Progress/Problems: Derrick Mata was able to share how he connected with the letter "I am your addiction" pt stated he knows the addiction is always there even when you have been clean- pt states the addiction tries to get you when you are the most weak and in order to stay sober you have to be on guard. Pt able to give feedback and support.   [name] 04/02/2011, 5:07 PM

## 2011-04-02 NOTE — Progress Notes (Signed)
Patient has been up and in the milieu today.  Has attended groups and is interacting well with staff and peers.  His plan is to probably discharge tomorrow and follow up with the [facility] and with [facility].  He will also attend AA meetings and is aware of the schedule in his area.  Wife is supportive and wants to have him back home.  CIWA score of zero at noon today.

## 2011-04-02 NOTE — Progress Notes (Signed)
Recreation Therapy Group Note  Date: 04/02/2011         Time: 1000       Group Topic/Focus: Patient invited to participate in animal assisted therapy. Pets as a coping skill and responsibility were discussed.   Participation Level: Active  Participation Quality: Appropriate and Attentive  Affect: Appropriate  Cognitive: Appropriate and Oriented   Additional Comments: None

## 2011-04-02 NOTE — Discharge Summary (Signed)
Physician Discharge Summary  Patient ID: Derrick Mata MRN: [id number] DOB/AGE: Feb 01, 1951 60 y.o.  Admit date: 03/28/2011 Discharge date: 04/02/2011  Discharge Diagnoses:  Principal Problem:  *Alcohol dependence  Axis I #1 alcohol dependence #2 cocaine dependence #3 opiate dependence #4 substance-induced mood disorder Axis II rule out personality disorder features Axis III obesity significant pain problems with knees several other medical problems Axis IV moderate Axis V 50  Discharged Condition: Good  Hospital Course: The patient was admitted and placed on Librium detox and did well in that. He was placed on nicotine patch and did well on that as well. He was given the opiates but this was stopped. He was given Naprosyn which seemed to help manage his pain fairly well after Cymbalta was added.  He cooperated with case management and was agreeable to a referral to a [facility] and agreed to followup with AA meetings.  Fairly significant heart heart discussion with his wife and he was pleased to realize that he had her continued support after 38 years of marriage and multiple bouts of relapse.  Discharge Exam: Blood pressure 141/92, pulse 97, temperature 98.3 F (36.8 C), temperature source Oral, resp. rate 20, height 6\' 1"  (1.854 m), weight 209.562 kg (462 lb), SpO2 96.00%.  Patient denies suicidal or homicidal ideation, hallucinations, illusions, or delusions. Patient engages with good eye contact, is able to focus adequately in a one to one setting, and has clear goal directed thoughts. Patient speaks with a natural conversational volume, rate, and tone. Anxiety was reported at 6 or 7 on a scale of 1 the least and 10 the most. Depression was reported at 5 on the same scale. Patient is oriented times 4, recent and remote memory intact. Judgement: Improved from admission Insight: Improved from admission Plan[facility] and [facility]   Disposition:  Home  Current Discharge Medication List    START taking these medications   Details  DULoxetine (CYMBALTA) 20 MG capsule Take 2 capsules (40 mg total) by mouth daily. Qty: 60 capsule, Refills: 0    naproxen (NAPROSYN) 500 MG tablet Take 1 tablet (500 mg total) by mouth 2 (two) times daily with a meal. Qty: 60 tablet, Refills: 0    nicotine (NICODERM CQ - DOSED IN MG/24 HOURS) 21 mg/24hr patch Place 21 patches onto the skin daily at 6 (six) AM. Qty: 21 patch, Refills: 0    thiamine 100 MG tablet Take 1 tablet (100 mg total) by mouth daily. Qty: 30 tablet, Refills: 0      CONTINUE these medications which have NOT CHANGED   Details  aspirin EC 81 MG tablet Take 81 mg by mouth daily.      buPROPion (WELLBUTRIN SR) 150 MG 12 hr tablet Take 1 tablet (150 mg total) by mouth 2 (two) times daily. Qty: 90 tablet, Refills: 0   Associated Diagnoses: Depressive disorder, not elsewhere classified    furosemide (LASIX) 40 MG tablet Take 40 mg by mouth daily.     hydroxypropyl methylcellulose (ISOPTO TEARS) 2.5 % ophthalmic solution Place 1 drop into both eyes daily as needed. Redness of the eye    Multiple Vitamin (MULTIVITAMIN) tablet Take 1 tablet by mouth daily.      omeprazole (PRILOSEC) 20 MG capsule Take 20 mg by mouth daily.      potassium chloride SA (K-DUR,KLOR-CON) 20 MEQ tablet Take 20 mEq by mouth daily.      traZODone (DESYREL) 100 MG tablet Take 1 tablet (100 mg total) by  mouth at bedtime. Qty: 30 tablet, Refills: 0      STOP taking these medications     HYDROcodone-acetaminophen (VICODIN) 5-500 MG per tablet          Signed: Mylie Mccurley 04/02/2011, 5:40 PM

## 2011-04-02 NOTE — Discharge Summary (Signed)
Discharge Note  Date of Admission:  03/28/2011  Date of Discharge:  04/03/2011  Level of Care:  OP  Discharge destination:  Home  Is patient on multiple antipsychotic therapies at discharge:  No    Patient phone:  [phone] (home)  Patient address:   [address] [place] [address],    Follow-up recommendations:    Comments:    The patient received suicide prevention pamphlet:  No Belongings returned:  Judene Companion, Delbert Vu 04/02/2011, 5:38 PM

## 2011-04-02 NOTE — Progress Notes (Signed)
Interdisciplinary Treatment Plan Update (Adult)  Date:  04/02/2011 Time Reviewed:  8:19 AM  Progress in Treatment: Attending groups: Yes. Participating in groups:  Yes. Taking medication as prescribed:  Yes. Tolerating medication:  Yes. Family/Significant othe contact made:  Yes, individual(s) contacted:  family session with wife and pt on 11/12 Patient understands diagnosis:  Yes. Discussing patient identified problems/goals with staff:  Yes. Medical problems stabilized or resolved:  Yes. Denies suicidal/homicidal ideation: No. Issues/concerns per patient self-inventory:  No. Other:  New problem(s) identified: Yes, Describe:  Derrick Mata feels overhwelmed by physical issues, including need to lose 200 lbs in order to be eligible for surgery on knee  Reason for Continuation of Hospitalization: Depression Suicidal ideation  Interventions implemented related to continuation of hospitalization: Taking [name] off of narcotics and trying other meds for pain  Family session  Support, encouragement  Group therapy  Additional comments:  Estimated length of stay: 1-2 days  Discharge Plan: return home, follow up IOP [facility] New goal(s):  Review of initial/current patient goals per problem list:   1.  Goal(s):Eliminate SI  Met:  Yes  Target date:11/14  As evidenced [id number] by pt   2.  Goal (s):Decrease depression  Met:  No  Target date:11/14  As evidenced [id number] to decrease from current level to 3  3.  Goal(s):Identify comprehensive sobriety plan  Met:  Yes  Target date:11/14 As evidenced JY:[id number] up plan for post hospitalization 4.  Goal(s):  Met:  Yes  Target date:  As evidenced by:  Attendees: Patient:   11/13/20128:19 AM  Family:   11/13/20128:19 AM  Physician:  Orson Aloe MD 11/13/20128:19 AM  Nursing:   Joslyn Devon RN 11/13/20128:19 AM  Case Manager:  Richelle Ito LCSW 11/13/20128:19 AM  Counselor:  Vanetta Mulders  LPCA 11/13/20128:19 AM  Other:  Minette Headland LCSWA 11/13/20128:19 AM  Other: Izola Price RN 11/13/20128:19 AM  Other:  11/13/20128:19 AM  Other:   11/13/20128:19 AM   Scribe for Treatment Team:   Ida Rogue, 04/02/2011, 8:19 AM

## 2011-04-02 NOTE — Progress Notes (Signed)
In dayroom, watching tv with peers. Appears flat and depressed. Calm and cooperative with assessment. Open and spontaneous in conversation. A/Ox4. No acute distress noted. States he has had a good day. When asked to qualify a good day, states he feels like he has a good DCP in place and he now understands that he has a mental illness that predisposes him to addiction. Support and encouragement provided. States he feels like he ready for D/C tomorrow, but states he also needs to go to help his wife care for their grandchild. States he is going to get into a wellness program and f/u with the [facility]. Otherwise no questions or concerns. Denies pain. Denies SI/HI/AVH and contracts for safety. POC and medications for the shift reviewed and understanding verbalized. Safety has been maintained with Q32minute observatrion. Will continue current POC.

## 2011-04-02 NOTE — Progress Notes (Signed)
Suicide Risk Assessment  Discharge Assessment     Demographic factors:    Current Mental Status:    Risk Reduction Factors:     CLINICAL FACTORS:   Severe Anxiety and/or Agitation Depression:   Comorbid alcohol abuse/dependence Alcohol/Substance Abuse/Dependencies Chronic Pain More than one psychiatric diagnosis Previous Psychiatric Diagnoses and Treatments Medical Diagnoses and Treatments/Surgeries  COGNITIVE FEATURES THAT CONTRIBUTE TO RISK:  No cognitive risk factors noted    SUICIDE RISK:   Minimal: No identifiable suicidal ideation.  Patients presenting with no risk factors but with morbid ruminations; may be classified as minimal risk based on the severity of the depressive symptoms  Patient denies suicidal or homicidal ideation, hallucinations, illusions, or delusions. Patient engages with good eye contact, is able to focus adequately in a one to one setting, and has clear goal directed thoughts. Patient speaks with a natural conversational volume, rate, and tone. Anxiety was reported at 6 or 7 on a scale of 1 the least and 10 the most. Depression was reported at 5 on the same scale. Patient is oriented times 4, recent and remote memory intact. Judgement: Improved from admission Insight: Improved from admission Plan: [facility] and [facility] meetings  [place], Derrick Mata 04/02/2011, 5:33 PM

## 2011-04-03 NOTE — Progress Notes (Signed)
  Pt. awaiting discharge today.All instructions,belongings and Suicide prevention Pamphlet given to patient.He denies S/I,H/I and A/V hallucinations.States he is happy to go home and be with his Grandson.

## 2011-04-04 NOTE — Progress Notes (Signed)
Patient Discharge Instructions:  Dictated admission note faxed, Date faxed:  04/04/2011 D/C instructions faxed, Date faxed:  04/04/2011 D/C Summary faxed, Date faxed:  04/04/2011 Med. Rec. Form faxed, Date faxed:  04/04/2011  [name], 04/04/2011, 2:30 PM

## 2011-04-05 ENCOUNTER — Telehealth: Payer: Self-pay | Admitting: Internal Medicine

## 2011-04-05 NOTE — Telephone Encounter (Signed)
Can you work him in sooner.

## 2011-04-05 NOTE — Telephone Encounter (Signed)
Pt scheduled for a hospital follow up at the end of December and is wanting to come in sooner because he is having issues sleeping at night and says he is worried about his heart. Please contact pt

## 2011-04-15 ENCOUNTER — Telehealth: Payer: Self-pay | Admitting: Internal Medicine

## 2011-04-16 ENCOUNTER — Telehealth: Payer: Self-pay | Admitting: Internal Medicine

## 2011-04-16 NOTE — Telephone Encounter (Signed)
OK 

## 2011-04-16 NOTE — Telephone Encounter (Signed)
Pt aware.

## 2011-04-16 NOTE — Telephone Encounter (Signed)
Pt is sch for colonoscopy on Thurs 04/18/11 and pt says that he has eaten popcorn and also lima beans this wk end, and according to his paperwork re: his surgery, he should not eat either one of these. Pt wants to know if they will still be able to do the surgery. Pt says that he left a message at Dr Broadus John office, but has not gotten a response.

## 2011-04-16 NOTE — Telephone Encounter (Signed)
Dr Marina Goodell will advise

## 2011-04-16 NOTE — Telephone Encounter (Signed)
Pt called and he ate some popcorn Saturday and some Lima beans on Sunday. He is scheduled for a colon at [facility] on 04-18-11. Pt wants to make sure he is still ok to have the colon since he didn't follow the diet like he should have. Dr. Marina Goodell please advise.

## 2011-04-18 ENCOUNTER — Encounter (HOSPITAL_COMMUNITY): Payer: Self-pay

## 2011-04-18 ENCOUNTER — Ambulatory Visit (HOSPITAL_COMMUNITY): Payer: Medicare Other | Admitting: Anesthesiology

## 2011-04-18 ENCOUNTER — Encounter (HOSPITAL_COMMUNITY)
Admission: RE | Disposition: A | Discharge status: Home / Self Care | Payer: Self-pay | Source: Ambulatory Visit | Attending: Internal Medicine

## 2011-04-18 ENCOUNTER — Other Ambulatory Visit: Payer: Self-pay | Admitting: Internal Medicine

## 2011-04-18 ENCOUNTER — Encounter (HOSPITAL_COMMUNITY): Payer: Self-pay | Admitting: Anesthesiology

## 2011-04-18 ENCOUNTER — Ambulatory Visit (HOSPITAL_COMMUNITY)
Admission: RE | Admit: 2011-04-18 | Discharge: 2011-04-18 | Disposition: A | Discharge status: Home / Self Care | Payer: Medicare Other | Source: Ambulatory Visit | Attending: Internal Medicine | Admitting: Internal Medicine

## 2011-04-18 DIAGNOSIS — E669 Obesity, unspecified: Secondary | ICD-10-CM | POA: Insufficient documentation

## 2011-04-18 DIAGNOSIS — K5730 Diverticulosis of large intestine without perforation or abscess without bleeding: Secondary | ICD-10-CM | POA: Insufficient documentation

## 2011-04-18 DIAGNOSIS — Z1211 Encounter for screening for malignant neoplasm of colon: Secondary | ICD-10-CM

## 2011-04-18 DIAGNOSIS — Z86010 Personal history of colonic polyps: Secondary | ICD-10-CM

## 2011-04-18 DIAGNOSIS — G4730 Sleep apnea, unspecified: Secondary | ICD-10-CM | POA: Insufficient documentation

## 2011-04-18 DIAGNOSIS — D126 Benign neoplasm of colon, unspecified: Secondary | ICD-10-CM | POA: Insufficient documentation

## 2011-04-18 DIAGNOSIS — Z09 Encounter for follow-up examination after completed treatment for conditions other than malignant neoplasm: Secondary | ICD-10-CM | POA: Insufficient documentation

## 2011-04-18 DIAGNOSIS — K648 Other hemorrhoids: Secondary | ICD-10-CM | POA: Insufficient documentation

## 2011-04-18 HISTORY — DX: Chronic obstructive pulmonary disease, unspecified: J44.9

## 2011-04-18 HISTORY — PX: COLONOSCOPY: SHX5424

## 2011-04-18 SURGERY — COLONOSCOPY
Anesthesia: Monitor Anesthesia Care

## 2011-04-18 MED ORDER — LACTATED RINGERS IV SOLN
INTRAVENOUS | Status: DC
  Administered 2011-04-18 (×2): via INTRAVENOUS

## 2011-04-18 MED ORDER — FENTANYL CITRATE 0.05 MG/ML IJ SOLN
INTRAMUSCULAR | Status: DC | PRN
  Administered 2011-04-18: 50 ug via INTRAVENOUS

## 2011-04-18 MED ORDER — PROPOFOL 10 MG/ML IV EMUL
INTRAVENOUS | Status: DC | PRN
  Administered 2011-04-18: 300 ug/kg/min via INTRAVENOUS

## 2011-04-18 MED ORDER — MIDAZOLAM HCL 5 MG/5ML IJ SOLN
INTRAMUSCULAR | Status: DC | PRN
  Administered 2011-04-18: 2 mg via INTRAVENOUS

## 2011-04-18 NOTE — Brief Op Note (Signed)
See pentax report 

## 2011-04-18 NOTE — Transfer of Care (Signed)
Immediate Anesthesia Transfer of Care Note  Patient: Derrick Mata  Procedure(s) Performed:  COLONOSCOPY  Patient Location: PACU  Anesthesia Type: MAC  Level of Consciousness: awake, alert  and sedated  Airway & Oxygen Therapy: Patient Spontanous Breathing and Patient connected to face mask oxygen  Post-op Assessment: Report given to PACU RN and Post -op Vital signs reviewed and stable  Post vital signs: Reviewed and stable  Complications: No apparent anesthesia complications

## 2011-04-18 NOTE — H&P (Signed)
Derrick Mata PRESENTS FOR SURVEILLANCE COLONOSCOPY. MEDICAL HISTORY IS AS LISTED ABOVE. NO ACTIVE GI C/O. CHRONIC PROBLEMS STABLE. FOR PROPOFOL DUE TO OBESITY AND SLEEP APNEA. LAST EXAM 02-03-2006.  Vital signs: BP 149/84  Temp 97.7 F (36.5 C)  Resp 22  Ht 6' (1.829 m)  Wt 209.562 kg (462 lb)  BMI 62.66 kg/m2  Constitutional: OBESE.generally well-appearing, no acute distress Psychiatric: alert and oriented x3, cooperative Eyes: extraocular movements intact, anicteric, conjunctiva pink Mouth: oral pharynx moist, no lesions Neck: supple no lymphadenopathy Cardiovascular: heart regular rate and rhythm, no murmur Lungs: clear to auscultation bilaterally Abdomen:OBESE. soft, nontender, nondistended, no obvious ascites, no peritoneal signs, normal bowel sounds, no organomegaly Rectal: PER COLONOSCOPY REPORY Extremities: no lower extremity edema bilaterally Skin: no lesions on visible extremities Neuro: No focal deficits.   IMP: HX ADENOMAS PLAN: SURV. COLONOSCOPY.The nature of the procedure, as well as the risks, benefits, and alternatives were carefully and thoroughly reviewed with the patient. Ample time for discussion and questions allowed. The patient understood, was satisfied, and agreed to proceed.  .   JNP,MD

## 2011-04-18 NOTE — Anesthesia Postprocedure Evaluation (Signed)
  Anesthesia Post-op Note  Patient: Derrick Mata  Procedure(s) Performed:  COLONOSCOPY  Patient Location: PACU  Anesthesia Type: MAC  Level of Consciousness: awake and alert   Airway and Oxygen Therapy: Patient Spontanous Breathing  Post-op Pain: mild  Post-op Assessment: Post-op Vital signs reviewed, Patient's Cardiovascular Status Stable, Respiratory Function Stable, Patent Airway and No signs of Nausea or vomiting  Post-op Vital Signs: stable  Complications: No apparent anesthesia complications

## 2011-04-18 NOTE — Anesthesia Preprocedure Evaluation (Signed)
Anesthesia Evaluation  Patient identified by MRN, date of birth, ID band Patient awake    Reviewed: Allergy & Precautions, H&P , NPO status , Patient's Chart, lab work & pertinent test results  Airway Mallampati: II TM Distance: >3 FB Neck ROM: Full    Dental No notable dental hx.    Pulmonary neg pulmonary ROS, shortness of breath, sleep apnea and Continuous Positive Airway Pressure Ventilation , COPDCurrent Smoker,  clear to auscultation  Pulmonary exam normal       Cardiovascular hypertension, Pt. on medications +CHF and neg cardio ROS Regular Normal    Neuro/Psych PSYCHIATRIC DISORDERS Negative Neurological ROS  Negative Psych ROS   GI/Hepatic negative GI ROS, Neg liver ROS, GERD-  ,(+) Hepatitis -, C  Endo/Other  Negative Endocrine ROSMorbid obesity  Renal/GU negative Renal ROS  Genitourinary negative   Musculoskeletal negative musculoskeletal ROS (+)   Abdominal   Peds negative pediatric ROS (+)  Hematology negative hematology ROS (+)   Anesthesia Other Findings Upper front cap   Reproductive/Obstetrics negative OB ROS                           Anesthesia Physical Anesthesia Plan  ASA: III  Anesthesia Plan: MAC   Post-op Pain Management:    Induction: Intravenous  Airway Management Planned:   Additional Equipment:   Intra-op Plan:   Post-operative Plan: Extubation in OR  Informed Consent: I have reviewed the patients History and Physical, chart, labs and discussed the procedure including the risks, benefits and alternatives for the proposed anesthesia with the patient or authorized representative who has indicated his/her understanding and acceptance.   Dental advisory given  Plan Discussed with: CRNA  Anesthesia Plan Comments:         Anesthesia Quick Evaluation

## 2011-04-19 ENCOUNTER — Encounter (HOSPITAL_COMMUNITY): Payer: Self-pay | Admitting: Internal Medicine

## 2011-04-23 ENCOUNTER — Encounter: Payer: Self-pay | Admitting: *Deleted

## 2011-04-23 ENCOUNTER — Other Ambulatory Visit: Payer: Self-pay | Admitting: Internal Medicine

## 2011-05-03 ENCOUNTER — Other Ambulatory Visit: Payer: Self-pay | Admitting: Internal Medicine

## 2011-05-15 ENCOUNTER — Encounter: Payer: Self-pay | Admitting: Internal Medicine

## 2011-05-15 ENCOUNTER — Ambulatory Visit (INDEPENDENT_AMBULATORY_CARE_PROVIDER_SITE_OTHER): Payer: Medicare Other | Admitting: Internal Medicine

## 2011-05-15 DIAGNOSIS — I10 Essential (primary) hypertension: Secondary | ICD-10-CM

## 2011-05-15 DIAGNOSIS — F1920 Other psychoactive substance dependence, uncomplicated: Secondary | ICD-10-CM

## 2011-05-15 DIAGNOSIS — B1920 Unspecified viral hepatitis C without hepatic coma: Secondary | ICD-10-CM

## 2011-05-15 DIAGNOSIS — F329 Major depressive disorder, single episode, unspecified: Secondary | ICD-10-CM

## 2011-05-15 NOTE — Assessment & Plan Note (Signed)
He has not been compliant with follow up. I have made referral to Hep C clinic. Pt understands it is his responsibility to follow up

## 2011-05-15 NOTE — Assessment & Plan Note (Signed)
Pt does not know who his psychiatrist is. I have asked him to make sure that he is seeing someone at [facility] or at [facility] (at one point there was a psychiatrist affiliated with that group).

## 2011-05-15 NOTE — Progress Notes (Signed)
  Subjective:    Patient ID: Derrick Mata, male    DOB: Nov 05, 1950, 60 y.o.   MRN: [id number]  HPI Admitted to [facility] for drug and alcohol abuse He now attends class 3 times weekly at [facility] Depression-tolerating meds without difficulty States he doesn't sleep well.--ongoing for years He does not know who his psychiatrist is--needs f/u labs  [facility]--pt states that he let his Dad use his CPAP machine, now he wants a new one. [facility] is vendor  Hepatitis C. Pt states he was told that he did not need to see hepatologist. I don't have any record of that. Reviewed labs  Past Medical History  Diagnosis Date  . Sleep apnea   . Anxiety   . Arthritis   . CHF (congestive heart failure)   . Substance abuse   . Depression   . GERD (gastroesophageal reflux disease)   . Shortness of breath   . COPD (chronic obstructive pulmonary disease)    Past Surgical History  Procedure Date  . Lumbar laminectomy 1994, 2010  . Tibia fracture surgery 2006    hardware  . Knee arthroscopy 2011    left  . Tonsillectomy 1963  . Colonoscopy 04/18/2011    Procedure: COLONOSCOPY;  Surgeon: Yancey Flemings, MD;  Location: [facility] END[facility];  Service: Endoscopy;  Laterality: N/A;    reports that he has been smoking Cigarettes.  He has been smoking about 1 pack per day. He does not have any smokeless tobacco history on file. He reports that he drinks alcohol. He reports that he does not use illicit drugs. family history includes Colon cancer in his sister.  There is no history of Stomach cancer and Rectal cancer. Allergies  Allergen Reactions  . Penicillins     REACTION: nausea  . Sulfamethoxazole     REACTION: urticaria (hives)      Review of Systems  patient denies chest pain, shortness of breath, orthopnea. Denies lower extremity edema, abdominal pain, change in appetite, change in bowel movements. Patient denies rashes, musculoskeletal complaints. No other specific complaints in a complete review of systems.     Objective:   Physical Exam  morbidly male in no acute distress. HEENT exam atraumatic, normocephalic, neck supple without jugular venous distention. Chest clear to auscultation cardiac exam S1-S2 are regular.      Assessment & Plan:

## 2011-05-15 NOTE — Assessment & Plan Note (Signed)
BP Readings from Last 3 Encounters:  05/15/11 146/94  04/18/11 156/114  04/18/11 156/114   Could be better controlled but given his morbid obesity his BP is likely adequate

## 2011-05-20 ENCOUNTER — Telehealth: Payer: Self-pay | Admitting: Internal Medicine

## 2011-05-20 DIAGNOSIS — F329 Major depressive disorder, single episode, unspecified: Secondary | ICD-10-CM

## 2011-05-20 MED ORDER — TRAZODONE HCL 100 MG PO TABS: 100.0000 mg | ORAL_TABLET | Freq: Every day | ORAL | Status: DC

## 2011-05-20 MED ORDER — POTASSIUM CHLORIDE CRYS ER 20 MEQ PO TBCR: 20.0000 meq | EXTENDED_RELEASE_TABLET | Freq: Every day | ORAL | Status: DC

## 2011-05-20 MED ORDER — BUPROPION HCL ER (SR) 150 MG PO TB12: 150.0000 mg | ORAL_TABLET | Freq: Two times a day (BID) | ORAL | Status: DC

## 2011-05-20 NOTE — Telephone Encounter (Signed)
Pt would like new rx bupropion 150mg  ,trazadone 100mg ,potassium chloride 20mg  call into [facility] [phone]. Please verify medications and strength

## 2011-05-23 ENCOUNTER — Telehealth: Payer: Self-pay | Admitting: Internal Medicine

## 2011-05-23 DIAGNOSIS — G4730 Sleep apnea, unspecified: Secondary | ICD-10-CM

## 2011-05-23 NOTE — Telephone Encounter (Signed)
Checking on status of Cpap machine and some kind of shots that he is supposed to be starting. Please advise.

## 2011-05-23 NOTE — Telephone Encounter (Signed)
Pt is stating he needs labs from somewhere on [address]?  I don't see an order.  No referral entered for [facility].  Looked at Dr Cato Mulligan OV note and it does state to refer to [facility] for CPAP.  Referral order placed

## 2011-06-04 ENCOUNTER — Telehealth: Payer: Self-pay

## 2011-06-04 NOTE — Telephone Encounter (Signed)
Pt would like to know if he needs a tdap or shingles vaccine. Pt aware to call insurance and get information about the cost of the vaccinations.    Pt states he is having pain in his groin area that runs up to his hip since the last time he saw Dr. Cato Mulligan.  Pt states he also has fluid on his ankles.    Pt would like to know if he order has been entered for his cpap machine. pls advise.    Pt has an appt with NP Orvan Falconer on 06/07/11 to discuss groin pain.

## 2011-06-05 NOTE — Telephone Encounter (Signed)
Order faxed to [facility] for CPAP settings

## 2011-06-07 ENCOUNTER — Ambulatory Visit: Payer: Medicare Other | Admitting: Family

## 2011-06-07 DIAGNOSIS — Z0289 Encounter for other administrative examinations: Secondary | ICD-10-CM

## 2011-06-10 ENCOUNTER — Encounter: Payer: Self-pay | Admitting: Family

## 2011-06-10 ENCOUNTER — Ambulatory Visit (INDEPENDENT_AMBULATORY_CARE_PROVIDER_SITE_OTHER): Payer: Medicare Other | Admitting: Family

## 2011-06-10 ENCOUNTER — Ambulatory Visit (HOSPITAL_COMMUNITY)
Admission: RE | Admit: 2011-06-10 | Discharge: 2011-06-10 | Disposition: A | Discharge status: Home / Self Care | Payer: Medicare Other | Source: Ambulatory Visit | Attending: Family | Admitting: Family

## 2011-06-10 ENCOUNTER — Other Ambulatory Visit: Payer: Self-pay | Admitting: Internal Medicine

## 2011-06-10 VITALS — BP 140/70 | HR 110 | Temp 97.9°F | Wt >= 6400 oz

## 2011-06-10 DIAGNOSIS — J209 Acute bronchitis, unspecified: Secondary | ICD-10-CM

## 2011-06-10 DIAGNOSIS — M1610 Unilateral primary osteoarthritis, unspecified hip: Secondary | ICD-10-CM | POA: Insufficient documentation

## 2011-06-10 DIAGNOSIS — Z720 Tobacco use: Secondary | ICD-10-CM

## 2011-06-10 DIAGNOSIS — M169 Osteoarthritis of hip, unspecified: Secondary | ICD-10-CM | POA: Insufficient documentation

## 2011-06-10 DIAGNOSIS — F17200 Nicotine dependence, unspecified, uncomplicated: Secondary | ICD-10-CM

## 2011-06-10 DIAGNOSIS — M25559 Pain in unspecified hip: Secondary | ICD-10-CM | POA: Insufficient documentation

## 2011-06-10 DIAGNOSIS — M25551 Pain in right hip: Secondary | ICD-10-CM

## 2011-06-10 DIAGNOSIS — J069 Acute upper respiratory infection, unspecified: Secondary | ICD-10-CM

## 2011-06-10 MED ORDER — AZITHROMYCIN 250 MG PO TABS: ORAL_TABLET | ORAL | Status: AC

## 2011-06-10 MED ORDER — MELOXICAM 15 MG PO TABS: 15.0000 mg | ORAL_TABLET | Freq: Every day | ORAL | Status: DC

## 2011-06-10 NOTE — Progress Notes (Signed)
Subjective:    Patient ID: Derrick Mata, male    DOB: 1951-01-03, 61 y.o.   MRN: [id number]  HPI 61 year old African American male, patient of sources in today with complaints of fever, chills, itchy throat, cough and headache that's been going on for 3 days and worsening. He has had chest congestion. He's been taken over-the-counter allergy tablets it has not helped his symptoms much. He smokes a pack of cigarettes per day.  The patient also has complaints of pain in his right hip that's been going on for several weeks. The pain is worse with movement, rating it a 7/10. Describes the pain as a dull ache. He has a history of arthritis and both knees. Orthopedics will like to do a knee replacement on him however he needs to lose about 200 pounds.   Review of Systems  Constitutional: Positive for fever and fatigue.  HENT: Positive for congestion, sore throat, postnasal drip and sinus pressure.   Eyes: Positive for itching.  Respiratory: Positive for cough.   Cardiovascular: Negative.   Musculoskeletal: Positive for arthralgias.       Right hip pain  Neurological: Negative.   Hematological: Negative.   Psychiatric/Behavioral: Negative.        Past Medical History  Diagnosis Date  . Sleep apnea   . Anxiety   . Arthritis   . CHF (congestive heart failure)   . Substance abuse   . Depression   . GERD (gastroesophageal reflux disease)   . Shortness of breath   . COPD (chronic obstructive pulmonary disease)     History   Social History  . Marital Status: Married    Spouse Name: N/A    Number of Children: N/A  . Years of Education: N/A   Occupational History  . Not on file.   Social History Main Topics  . Smoking status: Current Everyday Smoker -- 1.0 packs/day    Types: Cigarettes  . Smokeless tobacco: Not on file  . Alcohol Use: Yes     DRINKS 1/2 PINT LIQUOR DAILY  . Drug Use: No  . Sexually Active: Not on file     states no alcohol in 3 weeks   Other Topics  Concern  . Not on file   Social History Narrative  . No narrative on file    Past Surgical History  Procedure Date  . Lumbar laminectomy 1994, 2010  . Tibia fracture surgery 2006    hardware  . Knee arthroscopy 2011    left  . Tonsillectomy 1963  . Colonoscopy 04/18/2011    Procedure: COLONOSCOPY;  Surgeon: Yancey Flemings, MD;  Location: [facility];  Service: Endoscopy;  Laterality: N/A;    Family History  Problem Relation Age of Onset  . Colon cancer Sister   . Stomach cancer Neg Hx   . Rectal cancer Neg Hx     Allergies  Allergen Reactions  . Penicillins     REACTION: nausea  . Sulfamethoxazole     REACTION: urticaria (hives)    Current Outpatient Prescriptions on File Prior to Visit  Medication Sig Dispense Refill  . aspirin EC 81 MG tablet Take 81 mg by mouth daily.        Marland Kitchen buPROPion (WELLBUTRIN SR) 150 MG 12 hr tablet Take 1 tablet (150 mg total) by mouth 2 (two) times daily.  90 tablet  3  . DULoxetine (CYMBALTA) 20 MG capsule Take 2 capsules (40 mg total) by mouth daily.  60 capsule  0  . furosemide (  LASIX) 40 MG tablet TAKE 1 TABLET BY MOUTH ONCE DAILY  90 tablet  2  . Multiple Vitamin (MULTIVITAMIN) tablet Take 1 tablet by mouth daily.        Marland Kitchen omeprazole (PRILOSEC) 20 MG capsule Take 20 mg by mouth daily.        . potassium chloride SA (K-DUR,KLOR-CON) 20 MEQ tablet Take 1 tablet (20 mEq total) by mouth daily.  90 tablet  3  . thiamine 100 MG tablet Take 1 tablet (100 mg total) by mouth daily.  30 tablet  0  . traZODone (DESYREL) 100 MG tablet Take 1 tablet (100 mg total) by mouth at bedtime.  30 tablet  2  . naproxen (NAPROSYN) 500 MG tablet Take 1 tablet (500 mg total) by mouth 2 (two) times daily with a meal.  60 tablet  0    Pulse 110  Temp(Src) 97.9 F (36.6 C) (Oral)  Wt 460 lb (208.655 kg)  SpO2 96%chart Objective:   Physical Exam  Constitutional: He is oriented to person, place, and time. He appears well-developed and well-nourished.  HENT:    Right Ear: External ear normal.  Left Ear: External ear normal.  Nose: Nose normal.  Mouth/Throat: Oropharynx is clear and moist.  Cardiovascular: Normal rate, regular rhythm and normal heart sounds.   Pulmonary/Chest: Effort normal. He has wheezes.       Mild wheezing to auscultation, good air movement.  Musculoskeletal: Normal range of motion.       Right hip pain noted palpation. Pain also noted with abduction and abduction if. Pain with flexion and extension. Pain also noted with internal rotation.  Neurological: He is alert and oriented to person, place, and time.  Skin: Skin is warm and dry.  Psychiatric: He has a normal mood and affect.          Assessment & Plan:  Assessment: Upper respiratory infection, bronchitis, right hip pain  Plan: Mobic 15 mg one tablet by mouth daily. X-ray of the right hip to evaluate for osteoarthritis. Z-Pak as directed. Coricidin HBP over-the-counter as directed. Patient only off as his symptoms worsen or persist. Recheck as scheduled, and when necessary. Rest. Her platelets.

## 2011-06-10 NOTE — Patient Instructions (Signed)
Upper Respiratory Infection, Adult An upper respiratory infection (URI) is also sometimes known as the common cold. The upper respiratory tract includes the nose, sinuses, throat, trachea, and bronchi. Bronchi are the airways leading to the lungs. Most people improve within 1 week, but symptoms can last up to 2 weeks. A residual cough may last even longer.  CAUSES Many different viruses can infect the tissues lining the upper respiratory tract. The tissues become irritated and inflamed and often become very moist. Mucus production is also common. A cold is contagious. You can easily spread the virus to others by oral contact. This includes kissing, sharing a glass, coughing, or sneezing. Touching your mouth or nose and then touching a surface, which is then touched by another person, can also spread the virus. SYMPTOMS  Symptoms typically develop 1 to 3 days after you come in contact with a cold virus. Symptoms vary from person to person. They may include:  Runny nose.   Sneezing.   Nasal congestion.   Sinus irritation.   Sore throat.   Loss of voice (laryngitis).   Cough.   Fatigue.   Muscle aches.   Loss of appetite.   Headache.   Low-grade fever.  DIAGNOSIS  You might diagnose your own cold based on familiar symptoms, since most people get a cold 2 to 3 times a year. Your caregiver can confirm this based on your exam. Most importantly, your caregiver can check that your symptoms are not due to another disease such as strep throat, sinusitis, pneumonia, asthma, or epiglottitis. Blood tests, throat tests, and X-rays are not necessary to diagnose a common cold, but they may sometimes be helpful in excluding other more serious diseases. Your caregiver will decide if any further tests are required. RISKS AND COMPLICATIONS  You may be at risk for a more severe case of the common cold if you smoke cigarettes, have chronic heart disease (such as heart failure) or lung disease (such as  asthma), or if you have a weakened immune system. The very young and very old are also at risk for more serious infections. Bacterial sinusitis, middle ear infections, and bacterial pneumonia can complicate the common cold. The common cold can worsen asthma and chronic obstructive pulmonary disease (COPD). Sometimes, these complications can require emergency medical care and may be life-threatening. PREVENTION  The best way to protect against getting a cold is to practice good hygiene. Avoid oral or hand contact with people with cold symptoms. Wash your hands often if contact occurs. There is no clear evidence that vitamin C, vitamin E, echinacea, or exercise reduces the chance of developing a cold. However, it is always recommended to get plenty of rest and practice good nutrition. TREATMENT  Treatment is directed at relieving symptoms. There is no cure. Antibiotics are not effective, because the infection is caused by a virus, not by bacteria. Treatment may include:  Increased fluid intake. Sports drinks offer valuable electrolytes, sugars, and fluids.   Breathing heated mist or steam (vaporizer or shower).   Eating chicken soup or other clear broths, and maintaining good nutrition.   Getting plenty of rest.   Using gargles or lozenges for comfort.   Controlling fevers with ibuprofen or acetaminophen as directed by your caregiver.   Increasing usage of your inhaler if you have asthma.  Zinc gel and zinc lozenges, taken in the first 24 hours of the common cold, can shorten the duration and lessen the severity of symptoms. Pain medicines may help with fever, muscle   aches, and throat pain. A variety of non-prescription medicines are available to treat congestion and runny nose. Your caregiver can make recommendations and may suggest nasal or lung inhalers for other symptoms.  HOME CARE INSTRUCTIONS   Only take over-the-counter or prescription medicines for pain, discomfort, or fever as directed  by your caregiver.   Use a warm mist humidifier or inhale steam from a shower to increase air moisture. This may keep secretions moist and make it easier to breathe.   Drink enough water and fluids to keep your urine clear or pale yellow.   Rest as needed.   Return to work when your temperature has returned to normal or as your caregiver advises. You may need to stay home longer to avoid infecting others. You can also use a face mask and careful hand washing to prevent spread of the virus.  SEEK MEDICAL CARE IF:   After the first few days, you feel you are getting worse rather than better.   You need your caregiver's advice about medicines to control symptoms.   You develop chills, worsening shortness of breath, or brown or red sputum. These may be signs of pneumonia.   You develop yellow or brown nasal discharge or pain in the face, especially when you bend forward. These may be signs of sinusitis.   You develop a fever, swollen neck glands, pain with swallowing, or white areas in the back of your throat. These may be signs of strep throat.  SEEK IMMEDIATE MEDICAL CARE IF:   You have a fever.   You develop severe or persistent headache, ear pain, sinus pain, or chest pain.   You develop wheezing, a prolonged cough, cough up blood, or have a change in your usual mucus (if you have chronic lung disease).   You develop sore muscles or a stiff neck.  Document Released: 10/30/2000 Document Revised: 01/16/2011 Document Reviewed: 09/07/2010 Exit[employer] Patient Information 2012 [employer].  Smoking Cessation This document explains the best ways for you to quit smoking and new treatments to help. It lists new medicines that can double or triple your chances of quitting and quitting for good. It also considers ways to avoid relapses and concerns you may have about quitting, including weight gain. NICOTINE: A POWERFUL ADDICTION If you have tried to quit smoking, you know how hard it can  be. It is hard because nicotine is a very addictive drug. For some people, it can be as addictive as heroin or cocaine. Usually, people make 2 or 3 tries, or more, before finally being able to quit. Each time you try to quit, you can learn about what helps and what hurts. Quitting takes hard work and a lot of effort, but you can quit smoking. QUITTING SMOKING IS ONE OF THE MOST IMPORTANT THINGS YOU WILL EVER DO.  You will live longer, feel better, and live better.   The impact on your body of quitting smoking is felt almost immediately:   Within 20 minutes, blood pressure decreases. Pulse returns to its normal level.   After 8 hours, carbon monoxide levels in the blood return to normal. Oxygen level increases.   After 24 hours, chance of heart attack starts to decrease. Breath, hair, and body stop smelling like smoke.   After 48 hours, damaged nerve endings begin to recover. Sense of taste and smell improve.   After 72 hours, the body is virtually free of nicotine. Bronchial tubes relax and breathing becomes easier.   After 2 to 12   weeks, lungs can hold more air. Exercise becomes easier and circulation improves.   Quitting will reduce your risk of having a heart attack, stroke, cancer, or lung disease:   After 1 year, the risk of coronary heart disease is cut in half.   After 5 years, the risk of stroke falls to the same as a nonsmoker.   After 10 years, the risk of lung cancer is cut in half and the risk of other cancers decreases significantly.   After 15 years, the risk of coronary heart disease drops, usually to the level of a nonsmoker.   If you are pregnant, quitting smoking will improve your chances of having a healthy baby.   The people you live with, especially your children, will be healthier.   You will have extra money to spend on things other than cigarettes.  FIVE KEYS TO QUITTING Studies have shown that these 5 steps will help you quit smoking and quit for good. You  have the best chances of quitting if you use them together: 1. Get ready.  2. Get support and encouragement.  3. Learn new skills and behaviors.  4. Get medicine to reduce your nicotine addiction and use it correctly.  5. Be prepared for relapse or difficult situations. Be determined to continue trying to quit, even if you do not succeed at first.  1. GET READY  Set a quit date.   Change your environment.   Get rid of ALL cigarettes, ashtrays, matches, and lighters in your home, car, and place of work.   Do not let people smoke in your home.   Review your past attempts to quit. Think about what worked and what did not.   Once you quit, do not smoke. NOT EVEN A PUFF!  2. GET SUPPORT AND ENCOURAGEMENT Studies have shown that you have a better chance of being successful if you have help. You can get support in many ways.  Tell your family, friends, and coworkers that you are going to quit and need their support. Ask them not to smoke around you.   Talk to your caregivers (doctor, dentist, nurse, pharmacist, psychologist, and/or smoking counselor).   Get individual, group, or telephone counseling and support. The more counseling you have, the better your chances are of quitting. Programs are available at local hospitals and health centers. Call your local health department for information about programs in your area.   Spiritual beliefs and practices may help some smokers quit.   Quit meters are small computer programs online or downloadable that keep track of quit statistics, such as amount of "quit-time," cigarettes not smoked, and money saved.   Many smokers find one or more of the many self-help books available useful in helping them quit and stay off tobacco.  3. LEARN NEW SKILLS AND BEHAVIORS  Try to distract yourself from urges to smoke. Talk to someone, go for a walk, or occupy your time with a task.   When you first try to quit, change your routine. Take a different route to  work. Drink tea instead of coffee. Eat breakfast in a different place.   Do something to reduce your stress. Take a hot bath, exercise, or read a book.   Plan something enjoyable to do every day. Reward yourself for not smoking.   Explore interactive web-based programs that specialize in helping you quit.  4. GET MEDICINE AND USE IT CORRECTLY Medicines can help you stop smoking and decrease the urge to smoke. Combining medicine with   the above behavioral methods and support can quadruple your chances of successfully quitting smoking. The [place]. Food and Drug Administration (FDA) has approved 7 medicines to help you quit smoking. These medicines fall into 3 categories.  Nicotine replacement therapy (delivers nicotine to your body without the negative effects and risks of smoking):   Nicotine gum: Available over-the-counter.   Nicotine lozenges: Available over-the-counter.   Nicotine inhaler: Available by prescription.   Nicotine nasal spray: Available by prescription.   Nicotine skin patches (transdermal): Available by prescription and over-the-counter.   Antidepressant medicine (helps people abstain from smoking, but how this works is unknown):   Bupropion sustained-release (SR) tablets: Available by prescription.   Nicotinic receptor partial agonist (simulates the effect of nicotine in your brain):   Varenicline tartrate tablets: Available by prescription.   Ask your caregiver for advice about which medicines to use and how to use them. Carefully read the information on the package.   Everyone who is trying to quit may benefit from using a medicine. If you are pregnant or trying to become pregnant, nursing an infant, you are under age 18, or you smoke fewer than 10 cigarettes per day, talk to your caregiver before taking any nicotine replacement medicines.   You should stop using a nicotine replacement product and call your caregiver if you experience nausea, dizziness, weakness,  vomiting, fast or irregular heartbeat, mouth problems with the lozenge or gum, or redness or swelling of the skin around the patch that does not go away.   Do not use any other product containing nicotine while using a nicotine replacement product.   Talk to your caregiver before using these products if you have diabetes, heart disease, asthma, stomach ulcers, you had a recent heart attack, you have high blood pressure that is not controlled with medicine, a history of irregular heartbeat, or you have been prescribed medicine to help you quit smoking.  5. BE PREPARED FOR RELAPSE OR DIFFICULT SITUATIONS  Most relapses occur within the first 3 months after quitting. Do not be discouraged if you start smoking again. Remember, most people try several times before they finally quit.   You may have symptoms of withdrawal because your body is used to nicotine. You may crave cigarettes, be irritable, feel very hungry, cough often, get headaches, or have difficulty concentrating.   The withdrawal symptoms are only temporary. They are strongest when you first quit, but they will go away within 10 to 14 days.  Here are some difficult situations to watch for:  Alcohol. Avoid drinking alcohol. Drinking lowers your chances of successfully quitting.   Caffeine. Try to reduce the amount of caffeine you consume. It also lowers your chances of successfully quitting.   Other smokers. Being around smoking can make you want to smoke. Avoid smokers.   Weight gain. Many smokers will gain weight when they quit, usually less than 10 pounds. Eat a healthy diet and stay active. Do not let weight gain distract you from your main goal, quitting smoking. Some medicines that help you quit smoking may also help delay weight gain. You can always lose the weight gained after you quit.   Bad mood or depression. There are a lot of ways to improve your mood other than smoking.  If you are having problems with any of these  situations, talk to your caregiver. SPECIAL SITUATIONS AND CONDITIONS Studies suggest that everyone can quit smoking. Your situation or condition can give you a special reason to quit.  Pregnant women/new   mothers: By quitting, you protect your baby's health and your own.   Hospitalized patients: By quitting, you reduce health problems and help healing.   Heart attack patients: By quitting, you reduce your risk of a second heart attack.   Lung, head, and neck cancer patients: By quitting, you reduce your chance of a second cancer.   Parents of children and adolescents: By quitting, you protect your children from illnesses caused by secondhand smoke.  QUESTIONS TO THINK ABOUT Think about the following questions before you try to stop smoking. You may want to talk about your answers with your caregiver.  Why do you want to quit?   If you tried to quit in the past, what helped and what did not?   What will be the most difficult situations for you after you quit? How will you plan to handle them?   Who can help you through the tough times? Your family? Friends? Caregiver?   What pleasures do you get from smoking? What ways can you still get pleasure if you quit?  Here are some questions to ask your caregiver:  How can you help me to be successful at quitting?   What medicine do you think would be best for me and how should I take it?   What should I do if I need more help?   What is smoking withdrawal like? How can I get information on withdrawal?  Quitting takes hard work and a lot of effort, but you can quit smoking. FOR MORE INFORMATION  Smokefree.gov (http://www.smokefree.gov) provides free, accurate, evidence-based information and professional assistance to help support the immediate and long-term needs of people trying to quit smoking. Document Released: 04/30/2001 Document Revised: 01/16/2011 Document Reviewed: 02/20/2009 ExitCare Patient Information 2012 [employer]. 

## 2011-06-20 ENCOUNTER — Telehealth: Payer: Self-pay | Admitting: Internal Medicine

## 2011-06-20 DIAGNOSIS — Z0279 Encounter for issue of other medical certificate: Secondary | ICD-10-CM

## 2011-06-20 NOTE — Telephone Encounter (Signed)
Notified pt. 

## 2011-06-20 NOTE — Telephone Encounter (Signed)
Pt called req to get xray results.  °

## 2011-06-20 NOTE — Telephone Encounter (Signed)
Looks ok

## 2011-07-23 ENCOUNTER — Telehealth: Payer: Self-pay | Admitting: Internal Medicine

## 2011-07-23 MED ORDER — MELOXICAM 15 MG PO TABS: 15.0000 mg | ORAL_TABLET | Freq: Every day | ORAL | Status: AC

## 2011-07-23 NOTE — Telephone Encounter (Addendum)
Pt called and came in for ov on 06/10/11 and saw Adline Mango re: ?uri and pain in groin area. Pt was prescribed abx and meloxicam, and also ordered an xray. Pt said that he no longer has uri, but is still having pain in groin and said that he is out of Meloxicam. Pt also said that he never rcvd xray results. Pt req call back from nurse asap.   Also pt rcvd paperwork from the [facility]ology [facility], stating that he had been referred by Dr Cato Mulligan from 2006. Pt req call back.

## 2011-07-23 NOTE — Telephone Encounter (Signed)
Rx sent to pharmacy   

## 2011-07-24 NOTE — Telephone Encounter (Signed)
Pt aware.

## 2011-08-01 ENCOUNTER — Encounter: Payer: Self-pay | Admitting: Internal Medicine

## 2011-08-09 ENCOUNTER — Telehealth: Payer: Self-pay | Admitting: Internal Medicine

## 2011-08-09 MED ORDER — FUROSEMIDE 40 MG PO TABS: 40.0000 mg | ORAL_TABLET | Freq: Every day | ORAL | Status: DC

## 2011-08-09 NOTE — Telephone Encounter (Signed)
rx sent in electronically 

## 2011-08-09 NOTE — Telephone Encounter (Signed)
Pt requesting refill on furosemide (LASIX) 40 MG tablet

## 2011-08-26 ENCOUNTER — Telehealth: Payer: Self-pay | Admitting: Internal Medicine

## 2011-08-26 NOTE — Telephone Encounter (Signed)
Pt called to check on status of form for Handicap tag. Pt said that he dropped off this form about a month and a half ago, but has not gotten anything response. Pt said that it was a form that he had brought in from the [facility] license plate office. Pt says that he has to have this before 09/02/11. Pls call asap.

## 2011-08-29 NOTE — Telephone Encounter (Signed)
Up front ready for p/u, pt aware 

## 2011-09-05 ENCOUNTER — Ambulatory Visit: Payer: Medicare Other | Admitting: Gastroenterology

## 2011-10-24 ENCOUNTER — Other Ambulatory Visit: Payer: Self-pay | Admitting: *Deleted

## 2011-10-24 MED ORDER — OMEPRAZOLE 20 MG PO CPDR: 20.0000 mg | DELAYED_RELEASE_CAPSULE | Freq: Two times a day (BID) | ORAL | Status: DC

## 2012-01-07 ENCOUNTER — Other Ambulatory Visit: Payer: Self-pay | Admitting: *Deleted

## 2012-01-07 MED ORDER — TRAZODONE HCL 100 MG PO TABS: 100.0000 mg | ORAL_TABLET | Freq: Every day | ORAL | Status: DC

## 2012-01-16 ENCOUNTER — Ambulatory Visit: Payer: Medicare Other | Admitting: Gastroenterology

## 2012-02-13 ENCOUNTER — Telehealth: Payer: Self-pay | Admitting: Internal Medicine

## 2012-02-13 NOTE — Telephone Encounter (Signed)
Pt called and is req to script to have shingles vax, tdap, pneumonia vax done at [facility] on [address]. Pt said that pharmacy sent over a req for this on Saturday 02/08/12, but hasn't gotten a response.

## 2012-02-13 NOTE — Telephone Encounter (Signed)
That's fine if Dr. Cato Mulligan says its ok.

## 2012-02-13 NOTE — Telephone Encounter (Signed)
Pt called and is req to change pcp from Dr Timoteo Gaul to FNP Adline Mango, due to availability. Pls advise if ok?

## 2012-02-14 NOTE — Telephone Encounter (Signed)
Ok with me 

## 2012-03-18 ENCOUNTER — Other Ambulatory Visit: Payer: Self-pay | Admitting: Internal Medicine

## 2012-04-22 ENCOUNTER — Telehealth: Payer: Self-pay | Admitting: Family

## 2012-04-22 ENCOUNTER — Other Ambulatory Visit: Payer: Self-pay | Admitting: Internal Medicine

## 2012-04-22 ENCOUNTER — Telehealth: Payer: Self-pay | Admitting: Internal Medicine

## 2012-04-22 MED ORDER — TRAZODONE HCL 100 MG PO TABS: 100.0000 mg | ORAL_TABLET | Freq: Every day | ORAL | Status: DC

## 2012-04-22 NOTE — Telephone Encounter (Signed)
rx sent in electronically, but pt will need OV before more refills given

## 2012-04-22 NOTE — Telephone Encounter (Signed)
Pt would like to switch from you to Derrick Mata if it is OK with you? For avaiability reasons only.

## 2012-04-22 NOTE — Telephone Encounter (Signed)
Pt states he needs refill of TRAZODONE 100 mg. [facility]/ [address] ([facility]) Pt has only one pill for tonight left. Thank you.

## 2012-04-22 NOTE — Telephone Encounter (Signed)
Pt would like to switch to you from Dr Cato Mulligan. Is that OK?

## 2012-04-22 NOTE — Telephone Encounter (Signed)
Ok as long as Dr. Cato Mulligan is ok

## 2012-04-23 NOTE — Telephone Encounter (Signed)
ok 

## 2012-04-29 ENCOUNTER — Other Ambulatory Visit: Payer: Self-pay | Admitting: Internal Medicine

## 2012-05-21 ENCOUNTER — Other Ambulatory Visit: Payer: Self-pay | Admitting: Internal Medicine

## 2012-05-26 ENCOUNTER — Other Ambulatory Visit: Payer: Self-pay | Admitting: Internal Medicine

## 2012-05-27 ENCOUNTER — Ambulatory Visit (INDEPENDENT_AMBULATORY_CARE_PROVIDER_SITE_OTHER): Payer: PRIVATE HEALTH INSURANCE | Admitting: Family

## 2012-05-27 ENCOUNTER — Encounter: Payer: Self-pay | Admitting: Family

## 2012-05-27 VITALS — BP 140/92 | HR 100 | Wt >= 6400 oz

## 2012-05-27 DIAGNOSIS — K219 Gastro-esophageal reflux disease without esophagitis: Secondary | ICD-10-CM

## 2012-05-27 DIAGNOSIS — F329 Major depressive disorder, single episode, unspecified: Secondary | ICD-10-CM

## 2012-05-27 DIAGNOSIS — L259 Unspecified contact dermatitis, unspecified cause: Secondary | ICD-10-CM

## 2012-05-27 DIAGNOSIS — G4700 Insomnia, unspecified: Secondary | ICD-10-CM

## 2012-05-27 DIAGNOSIS — L309 Dermatitis, unspecified: Secondary | ICD-10-CM

## 2012-05-27 DIAGNOSIS — F32A Depression, unspecified: Secondary | ICD-10-CM

## 2012-05-27 DIAGNOSIS — M1990 Unspecified osteoarthritis, unspecified site: Secondary | ICD-10-CM

## 2012-05-27 LAB — LIPID PANEL
Cholesterol: 148 mg/dL (ref 0–200)
HDL: 50 mg/dL (ref >39.00)
Triglycerides: 112 mg/dL (ref 0.0–149.0)
VLDL: 22.4 mg/dL (ref 0.0–40.0)

## 2012-05-27 LAB — CBC WITH DIFFERENTIAL/PLATELET
Basophils Relative: 0.2 % (ref 0.0–3.0)
Eosinophils Absolute: 0.1 10*3/uL (ref 0.0–0.7)
Eosinophils Relative: 0.7 % (ref 0.0–5.0)
Hemoglobin: 15 g/dL (ref 13.0–17.0)
Lymphocytes Relative: 25.3 % (ref 12.0–46.0)
MCHC: 31.5 g/dL (ref 30.0–36.0)
Monocytes Relative: 5.6 % (ref 3.0–12.0)
Neutro Abs: 6.1 10*3/uL (ref 1.4–7.7)
Neutrophils Relative %: 68.2 % (ref 43.0–77.0)
RBC: 5.45 Mil/uL (ref 4.22–5.81)
WBC: 8.9 10*3/uL (ref 4.5–10.5)

## 2012-05-27 LAB — BASIC METABOLIC PANEL
BUN: 9 mg/dL (ref 6–23)
CO2: 26 mEq/L (ref 19–32)
Calcium: 8.9 mg/dL (ref 8.4–10.5)
Creatinine, Ser: 0.8 mg/dL (ref 0.4–1.5)

## 2012-05-27 LAB — HEPATIC FUNCTION PANEL
Bilirubin, Direct: 0.1 mg/dL (ref 0.0–0.3)
Total Protein: 7.6 g/dL (ref 6.0–8.3)

## 2012-05-27 MED ORDER — TRIAMCINOLONE 0.1 % CREAM:EUCERIN CREAM 1:1: 1.0000 "application " | TOPICAL_CREAM | Freq: Two times a day (BID) | CUTANEOUS | Status: DC

## 2012-05-27 MED ORDER — TEMAZEPAM 15 MG PO CAPS: 15.0000 mg | ORAL_CAPSULE | Freq: Every evening | ORAL | Status: DC | PRN

## 2012-05-27 NOTE — Progress Notes (Signed)
Subjective:    Patient ID: Derrick Duel., male    DOB: [birth year], 62 y.o.   MRN: [id number]  HPI  62 year old AAM, nonsmoker, patient of Dr. Cato Mulligan is in today for a recheck of GERD, Depression, Insomnia, and hypokalemia. He has concerns of persistent osteoarthritis in his knees that is worsening in the right knee. He has seen ortho in the past that was treating with joint injections that were effective but he could not continue to afford to keep going. Pain is worse with walking. Rates 5/10, in right knee. Also has concerns of eczema flare. He has been using Aveeno with no relief. Also concerned that trazodone 100mg  is not working.   Review of Systems  Constitutional: Negative.   HENT: Negative.   Respiratory: Negative.   Cardiovascular: Negative.   Gastrointestinal: Negative.   Musculoskeletal: Positive for arthralgias.       Right knee pain  Skin: Positive for rash.       Dry, itchy, rash to the trunk, lower legs and upper back  Neurological: Negative.   Hematological: Negative.   Psychiatric/Behavioral: Negative.    Past Medical History  Diagnosis Date  . Sleep apnea   . Anxiety   . Arthritis   . CHF (congestive heart failure)   . Substance abuse   . Depression   . GERD (gastroesophageal reflux disease)   . Shortness of breath   . COPD (chronic obstructive pulmonary disease)     History   Social History  . Marital Status: Married    Spouse Name: N/A    Number of Children: N/A  . Years of Education: N/A   Occupational History  . Not on file.   Social History Main Topics  . Smoking status: Current Every Day Smoker -- 1.0 packs/day    Types: Cigarettes  . Smokeless tobacco: Not on file  . Alcohol Use: Yes     Comment: DRINKS 1/2 PINT LIQUOR DAILY  . Drug Use: No  . Sexually Active: Not on file     Comment: states no alcohol in 3 weeks   Other Topics Concern  . Not on file   Social History Narrative  . No narrative on file    Past Surgical History    Procedure Date  . Lumbar laminectomy 1994, 2010  . Tibia fracture surgery 2006    hardware  . Knee arthroscopy 2011    left  . Tonsillectomy 1963  . Colonoscopy 04/18/2011    Procedure: COLONOSCOPY;  Surgeon: Yancey Flemings, MD;  Location: [facility];  Service: Endoscopy;  Laterality: N/A;    Family History  Problem Relation Age of Onset  . Colon cancer Sister   . Stomach cancer Neg Hx   . Rectal cancer Neg Hx     Allergies  Allergen Reactions  . Penicillins     REACTION: nausea  . Sulfamethoxazole     REACTION: urticaria (hives)    Current Outpatient Prescriptions on File Prior to Visit  Medication Sig Dispense Refill  . aspirin EC 81 MG tablet Take 81 mg by mouth daily.        . furosemide (LASIX) 40 MG tablet TAKE ONE TABLET BY MOUTH ONE TIME DAILY  30 tablet  0  . meloxicam (MOBIC) 15 MG tablet Take 1 tablet (15 mg total) by mouth daily.  30 tablet  2  . Multiple Vitamin (MULTIVITAMIN) tablet Take 1 tablet by mouth daily.        Marland Kitchen omeprazole (PRILOSEC) 20  MG capsule Take 1 capsule (20 mg total) by mouth 2 (two) times daily.  60 capsule  5  . buPROPion (WELLBUTRIN SR) 150 MG 12 hr tablet Take 1 tablet (150 mg total) by mouth 2 (two) times daily.  90 tablet  3  . DULoxetine (CYMBALTA) 20 MG capsule Take 2 capsules (40 mg total) by mouth daily.  60 capsule  0  . potassium chloride SA (K-DUR,KLOR-CON) 20 MEQ tablet Take 1 tablet (20 mEq total) by mouth daily.  90 tablet  3  . temazepam (RESTORIL) 15 MG capsule Take 1 capsule (15 mg total) by mouth at bedtime as needed for sleep.  30 capsule  4    BP 140/92  Pulse 100  Wt 458 lb 11.2 oz (208.065 kg)  SpO2 97%chart    Objective:   Physical Exam  Constitutional: He is oriented to person, place, and time. He appears well-developed and well-nourished.  HENT:  Right Ear: External ear normal.  Left Ear: External ear normal.  Nose: Nose normal.  Mouth/Throat: Oropharynx is clear and moist.  Neck: Normal range of motion.  Neck supple.  Cardiovascular: Normal rate, regular rhythm and normal heart sounds.   Abdominal: Soft. Bowel sounds are normal.  Musculoskeletal: He exhibits tenderness.       Tenderness to the right knee. No swelling. Full ROM with minimal pain.  Neurological: He is alert and oriented to person, place, and time.  Skin: Skin is warm. Rash noted.       Dry, flaky, excorated rash to the trunk, arms bilaterally, and legs.   Psychiatric: He has a normal mood and affect.       Informed consent obtained and the patient's right knee was prepped with betadine. Local anesthesia was obtained with topical spray. Then 60 mg of Depo-Medrol and 1 cc of lidocaine was injected into the joint space. The patient tolerated the procedure without complications. Post injection care discussed with patient.   Assessment & Plan:  Assessment: GERD, Depression, Insomnia, Hypokalemia, Osteoarthritis, Eczema  Plan: Meds as directed. D/C Trazadone. Start temazepam 15mg  at bedtime. Eucerin and Triamcinolone 1:1 mix to the AA twice a day. Call the office if symptoms worsen or persist. Recheck as scheduled and as needed.

## 2012-05-27 NOTE — Patient Instructions (Addendum)

## 2012-05-29 ENCOUNTER — Telehealth: Payer: Self-pay | Admitting: Family

## 2012-05-29 NOTE — Telephone Encounter (Signed)
Patient calls to report that since he saw Ms. Orvan Falconer on 05/27/12 he has only slept about 3.5 hours.  He tried the temazepam evening doubling the dose last night with it not helping.  He denies urgent symptoms, just states he is tired.  [facility], if Ms. Orvan Falconer would be willing to order something else he could try.

## 2012-06-01 MED ORDER — ZOLPIDEM TARTRATE 10 MG PO TABS: 10.0000 mg | ORAL_TABLET | Freq: Every evening | ORAL | Status: DC | PRN

## 2012-06-01 NOTE — Telephone Encounter (Signed)
Left message for pt to call back  °

## 2012-06-01 NOTE — Telephone Encounter (Signed)
Can try Ambien if he would like. Seems like he told me he couldn't it or it didn't work.

## 2012-06-24 ENCOUNTER — Other Ambulatory Visit: Payer: Self-pay | Admitting: Internal Medicine

## 2012-06-26 ENCOUNTER — Telehealth: Payer: Self-pay | Admitting: Family

## 2012-06-26 NOTE — Telephone Encounter (Signed)
Patient calling about this K-dur.  He is completely out.   States that the pharmacy told him that they faxed over 2 request for the refill.  Per EPIC, same was d/c'd.  He states that he has been taking and took the last pill on 2/6.   Please advise.

## 2012-06-29 MED ORDER — POTASSIUM CHLORIDE CRYS ER 20 MEQ PO TBCR: 20.0000 meq | EXTENDED_RELEASE_TABLET | Freq: Every day | ORAL | Status: DC

## 2012-06-29 NOTE — Telephone Encounter (Signed)
RX sent for potassium

## 2012-07-07 ENCOUNTER — Telehealth: Payer: Self-pay | Admitting: Family

## 2012-07-07 DIAGNOSIS — F329 Major depressive disorder, single episode, unspecified: Secondary | ICD-10-CM

## 2012-07-07 MED ORDER — BUPROPION HCL ER (SR) 150 MG PO TB12: 150.0000 mg | ORAL_TABLET | Freq: Two times a day (BID) | ORAL | Status: DC

## 2012-07-07 NOTE — Telephone Encounter (Signed)
Patient called stating that he need a refill of his buproprion 150 mg 12 hr tab sent to [facility]. Please assist.

## 2012-07-30 ENCOUNTER — Other Ambulatory Visit: Payer: Self-pay | Admitting: Family

## 2012-08-09 ENCOUNTER — Other Ambulatory Visit: Payer: Self-pay | Admitting: Family

## 2012-08-21 ENCOUNTER — Telehealth: Payer: Self-pay | Admitting: Family

## 2012-08-21 NOTE — Telephone Encounter (Signed)
Caller: Bretton/Patient; Phone: [phone]; Reason for Call: Patient calling about [facility] Rx.  States it is not holding him long enough to sleep all night.  He states he can sleep till 0230 or so, but then cannot go back to sleep.  States he used to be on trazedone 100mg  , and found that the dose was not enough to hold him to sleep, so tried the [place].  Would like to go back to a higher dose of trazedone perhaps 150mg , if possible.  Declines triage at this time.  Info to office for provider review/Rx/callback.  May reach patient at cell [phone] or home [phone] home.  Krs/can

## 2012-08-24 MED ORDER — TRAZODONE HCL 150 MG PO TABS: 150.0000 mg | ORAL_TABLET | Freq: Every day | ORAL | Status: DC

## 2012-08-24 NOTE — Telephone Encounter (Signed)
Trazadone increased to 150mg .

## 2012-08-30 ENCOUNTER — Encounter (HOSPITAL_COMMUNITY): Payer: Self-pay | Admitting: *Deleted

## 2012-08-30 DIAGNOSIS — F17200 Nicotine dependence, unspecified, uncomplicated: Secondary | ICD-10-CM | POA: Insufficient documentation

## 2012-08-30 DIAGNOSIS — R05 Cough: Secondary | ICD-10-CM | POA: Insufficient documentation

## 2012-08-30 DIAGNOSIS — M7989 Other specified soft tissue disorders: Secondary | ICD-10-CM | POA: Insufficient documentation

## 2012-08-30 DIAGNOSIS — R059 Cough, unspecified: Secondary | ICD-10-CM | POA: Insufficient documentation

## 2012-08-30 DIAGNOSIS — K219 Gastro-esophageal reflux disease without esophagitis: Secondary | ICD-10-CM | POA: Insufficient documentation

## 2012-08-30 DIAGNOSIS — Z79899 Other long term (current) drug therapy: Secondary | ICD-10-CM | POA: Insufficient documentation

## 2012-08-30 DIAGNOSIS — G4730 Sleep apnea, unspecified: Secondary | ICD-10-CM | POA: Insufficient documentation

## 2012-08-30 DIAGNOSIS — R0789 Other chest pain: Secondary | ICD-10-CM | POA: Insufficient documentation

## 2012-08-30 DIAGNOSIS — F3289 Other specified depressive episodes: Secondary | ICD-10-CM | POA: Insufficient documentation

## 2012-08-30 DIAGNOSIS — J441 Chronic obstructive pulmonary disease with (acute) exacerbation: Secondary | ICD-10-CM | POA: Insufficient documentation

## 2012-08-30 DIAGNOSIS — F329 Major depressive disorder, single episode, unspecified: Secondary | ICD-10-CM | POA: Insufficient documentation

## 2012-08-30 DIAGNOSIS — I509 Heart failure, unspecified: Secondary | ICD-10-CM | POA: Insufficient documentation

## 2012-08-30 DIAGNOSIS — J40 Bronchitis, not specified as acute or chronic: Secondary | ICD-10-CM | POA: Insufficient documentation

## 2012-08-30 DIAGNOSIS — Z8739 Personal history of other diseases of the musculoskeletal system and connective tissue: Secondary | ICD-10-CM | POA: Insufficient documentation

## 2012-08-30 DIAGNOSIS — F411 Generalized anxiety disorder: Secondary | ICD-10-CM | POA: Insufficient documentation

## 2012-08-30 DIAGNOSIS — Z7982 Long term (current) use of aspirin: Secondary | ICD-10-CM | POA: Insufficient documentation

## 2012-08-30 NOTE — ED Notes (Addendum)
C/o feet swelling Friday morning, also sob, worse today, (denies: CP, describes chest tightness or ache). Mentions coughing up blood x1. Speaking in clear complete sentences. Alert, NAD, calm, talkative and joking with staff, pleasant, polite.

## 2012-08-31 ENCOUNTER — Emergency Department (HOSPITAL_COMMUNITY)
Admission: EM | Admit: 2012-08-31 | Discharge: 2012-08-31 | Disposition: A | Discharge status: Home / Self Care | Payer: Medicare Other | Attending: Emergency Medicine | Admitting: Emergency Medicine

## 2012-08-31 ENCOUNTER — Telehealth: Payer: Self-pay | Admitting: Family

## 2012-08-31 ENCOUNTER — Emergency Department (HOSPITAL_COMMUNITY): Payer: Medicare Other

## 2012-08-31 DIAGNOSIS — R062 Wheezing: Secondary | ICD-10-CM

## 2012-08-31 DIAGNOSIS — M7989 Other specified soft tissue disorders: Secondary | ICD-10-CM

## 2012-08-31 DIAGNOSIS — J40 Bronchitis, not specified as acute or chronic: Secondary | ICD-10-CM

## 2012-08-31 DIAGNOSIS — Z8679 Personal history of other diseases of the circulatory system: Secondary | ICD-10-CM

## 2012-08-31 DIAGNOSIS — J449 Chronic obstructive pulmonary disease, unspecified: Secondary | ICD-10-CM

## 2012-08-31 LAB — COMPREHENSIVE METABOLIC PANEL
ALT: 29 U/L (ref 0–53)
AST: 44 U/L — ABNORMAL HIGH (ref 0–37)
Alkaline Phosphatase: 63 U/L (ref 39–117)
CO2: 28 mEq/L (ref 19–32)
GFR calc Af Amer: >90 mL/min (ref >90)
GFR calc non Af Amer: 89 mL/min — ABNORMAL LOW (ref >90)
Glucose, Bld: 103 mg/dL — ABNORMAL HIGH (ref 70–99)
Potassium: 4.1 mEq/L (ref 3.5–5.1)
Sodium: 137 mEq/L (ref 135–145)
Total Protein: 7.3 g/dL (ref 6.0–8.3)

## 2012-08-31 LAB — CBC WITH DIFFERENTIAL/PLATELET
Lymphocytes Relative: 29 % (ref 12–46)
Lymphs Abs: 3.1 10*3/uL (ref 0.7–4.0)
Neutrophils Relative %: 66 % (ref 43–77)
Platelets: 232 10*3/uL (ref 150–400)
RBC: 5.13 MIL/uL (ref 4.22–5.81)
WBC: 10.8 10*3/uL — ABNORMAL HIGH (ref 4.0–10.5)

## 2012-08-31 LAB — PRO B NATRIURETIC PEPTIDE: Pro B Natriuretic peptide (BNP): 32.9 pg/mL (ref 0–125)

## 2012-08-31 MED ORDER — AEROCHAMBER PLUS W/MASK MISC
1.0000 | Freq: Once | Status: AC
  Administered 2012-08-31: 1
  Filled 2012-08-31: qty 1

## 2012-08-31 MED ORDER — IPRATROPIUM BROMIDE 0.02 % IN SOLN
0.5000 mg | RESPIRATORY_TRACT | Status: DC
  Administered 2012-08-31: 0.5 mg via RESPIRATORY_TRACT
  Filled 2012-08-31: qty 2.5

## 2012-08-31 MED ORDER — ALBUTEROL SULFATE (5 MG/ML) 0.5% IN NEBU
2.5000 mg | INHALATION_SOLUTION | RESPIRATORY_TRACT | Status: DC
  Administered 2012-08-31: 2.5 mg via RESPIRATORY_TRACT
  Filled 2012-08-31: qty 0.5

## 2012-08-31 MED ORDER — ALBUTEROL SULFATE HFA 108 (90 BASE) MCG/ACT IN AERS
2.0000 | INHALATION_SPRAY | RESPIRATORY_TRACT | Status: DC | PRN
  Administered 2012-08-31: 2 via RESPIRATORY_TRACT
  Filled 2012-08-31: qty 6.7

## 2012-08-31 NOTE — Telephone Encounter (Signed)
[facility] Triage Call Report Triage Record Num: [id number] Operator: [place] [employer] Patient Name: Chuckie Mccathern Call Date & Time: 08/30/2012 10:43:08PM Patient Phone: [phone] PCP: Adline Mango Patient Gender: Male PCP Fax : Patient DOB: Jun 25, 1950 [facility] Name: [facility] Reason for Call: Caller: Jake/Patient; PCP: Adline Mango (Family Practice); CB#: [phone]; Call regarding Feet and toes swollen, breathing difficulty with onset 08/28/12, worse today; wife states that she can hear him wheezing some; Afebrile; Pt states that he coughed up some blood tinged sputum earlier in the week; Disposition of See ED Immediately d/t "New or worsening breathing problems that have not been evaluate" per Breathing Problems Protocol; Pt advised to have someone else drive him and go to ED, Pt states that he will go to [facility] ED. Protocol(s) Used: Breathing Problems Recommended Outcome per Protocol: See ED Immediately Reason for Outcome: New or worsening breathing problems that have not been evaluated Care Advice: ~ Another adult should drive. Call EMS 911 if develop new onset or increasing confusion or lethargy; breathing problems continue to worsen or skin becomes bluish/gray; develops chest pain. ~ Write down provider's name. List or place the following in a bag for transport with the patient: current prescription and/or nonprescription medications; alternative treatments, therapies and medications; and street drugs. ~ 04/13/

## 2012-08-31 NOTE — ED Provider Notes (Signed)
History     CSN: [id number]  Arrival date & time 08/30/12  2339   First MD Initiated Contact with Patient 08/31/12 0118      Chief Complaint  Patient presents with  . Shortness of Breath  . Leg Swelling    HPI Derrick Mata is a 62 y.o. male presents with bilateral lower extremity pitting edema he feels is worse in the right earlier today but is now equal, this started on Friday has been worsening and has not responded to his normal dose of 40 mg of by mouth Lasix. Patient says he has a history of congestive heart failure but denies any shortness of breath. He says he's had some tightness in his chest across the [place], and he says he's been wheezing, he says he has a history of COPD and currently smokes but does not use any albuterol or any other breathing treatments.  Patient has had some cough and on Monday had some mucus streaked with blood on Monday no frank hemoptysis.  Denies any history of venous thromboembolic disease.   Past Medical History  Diagnosis Date  . Sleep apnea   . Anxiety   . Arthritis   . CHF (congestive heart failure)   . Substance abuse   . Depression   . GERD (gastroesophageal reflux disease)   . Shortness of breath   . COPD (chronic obstructive pulmonary disease)     Past Surgical History  Procedure Laterality Date  . Lumbar laminectomy  1994, 2010  . Tibia fracture surgery  2006    hardware  . Knee arthroscopy  2011    left  . Tonsillectomy  1963  . Colonoscopy  04/18/2011    Procedure: COLONOSCOPY;  Surgeon: Yancey Flemings, MD;  Location: [facility];  Service: Endoscopy;  Laterality: N/A;    Family History  Problem Relation Age of Onset  . Colon cancer Sister   . Stomach cancer Neg Hx   . Rectal cancer Neg Hx     History  Substance Use Topics  . Smoking status: Current Every Day Smoker -- 1.00 packs/day    Types: Cigarettes  . Smokeless tobacco: Not on file  . Alcohol Use: Yes     Comment: DRINKS 1/2 PINT LIQUOR DAILY       Review of Systems At least 10pt or greater review of systems completed and are negative except where specified in the HPI.  Allergies  Penicillins and Sulfamethoxazole  Home Medications   Current Outpatient Rx  Name  Route  Sig  Dispense  Refill  . aspirin EC 81 MG tablet   Oral   Take 81 mg by mouth daily.          . Aspirin-Caffeine (ANACIN MAX STRENGTH PO)   Oral   Take 1-2 tablets by mouth daily as needed (for pain).          Marland Kitchen buPROPion (WELLBUTRIN XL) 150 MG 24 hr tablet   Oral   Take 150 mg by mouth daily.         . furosemide (LASIX) 40 MG tablet   Oral   Take 40 mg by mouth daily.         Marland Kitchen omeprazole (PRILOSEC) 20 MG capsule   Oral   Take 20 mg by mouth daily.         . potassium chloride SA (K-DUR,KLOR-CON) 20 MEQ tablet   Oral   Take 20 mEq by mouth daily.         Marland Kitchen  traZODone (DESYREL) 150 MG tablet   Oral   Take 1 tablet (150 mg total) by mouth at bedtime.   30 tablet   3   . Triamcinolone Acetonide (TRIAMCINOLONE 0.1 % CREAM : EUCERIN) CREA   Topical   Apply 1 application topically 2 (two) times daily.   1 each   1     Dispense 1 jar     BP 159/86  Pulse 81  Temp(Src) 98.1 F (36.7 C) (Oral)  Resp 18  SpO2 100%  Physical Exam  Nursing notes reviewed.  Electronic medical record reviewed. VITAL SIGNS:   Filed Vitals:   08/30/12 2354  BP: 159/86  Pulse: 81  Temp: 98.1 F (36.7 C)  TempSrc: Oral  Resp: 18  SpO2: 100%   CONSTITUTIONAL: Awake, oriented, appears non-toxic HENT: Atraumatic, normocephalic, oral mucosa pink and moist, airway patent. Nares patent without drainage. External ears normal. EYES: Conjunctiva clear, EOMI, PERRLA NECK: Trachea midline, non-tender, supple CARDIOVASCULAR: Normal heart rate, Normal rhythm, No murmurs, rubs, gallops PULMONARY/CHEST: No rhonchi or rales. Mild end expiratory wheezing throughout. Symmetrical breath sounds. Non-tender. ABDOMINAL: Non-distended, morbidly obese,  soft, non-tender - no rebound or guarding.  BS normal. NEUROLOGIC: Non-focal, moving all four extremities, no gross sensory or motor deficits. EXTREMITIES: No clubbing, cyanosis. 2+ pitting edema bilateral lower extremities SKIN: Warm, Dry, No erythema, No rash  ED Course  Procedures (including critical care time)  Labs Reviewed  CBC WITH DIFFERENTIAL - Abnormal; Notable for the following:    WBC 10.8 (*)    All other components within normal limits  COMPREHENSIVE METABOLIC PANEL - Abnormal; Notable for the following:    Glucose, Bld 103 (*)    Albumin 3.1 (*)    AST 44 (*)    GFR calc non Af Amer 89 (*)    All other components within normal limits  PRO B NATRIURETIC PEPTIDE  D-DIMER, QUANTITATIVE  POCT I-STAT TROPONIN I   Dg Chest 2 View  08/31/2012  *RADIOLOGY REPORT*  Clinical Data: Shortness of breath.  Leg swelling.  CHEST - 2 VIEW  Comparison: 06/08/2009  Findings: Heart size and pulmonary vascularity are normal.  No infiltrates or effusions.  No acute osseous abnormality.  IMPRESSION: No acute abnormalities.   Original Report Authenticated By: Francene Boyers, M.D.      1. Leg swelling   2. H/O CHF   3. Morbid obesity   4. COPD (chronic obstructive pulmonary disease)   5. Wheezing   6. Bronchitis       MDM  Patient's chief complaints are mucus streaked with blood earlier in the week as well as bilateral lower extremity edema.  I do not think this is a congestive heart failure exacerbation, his lungs are clear he is in no respiratory distress, vital signs are stable, patient is mildly hypertensive. He's afebrile, nontoxic. Patient is chiefly concerned about blood clots in his legs. It is highly unlikely that he would develop bilateral blood clots in lower extremities at the same time causing his edema. Patient says the swelling has actually gotten worse since he stopped taking his meloxicam for his bilateral knee pain.    My suspicion is very low for venous thrombotic  disease, d-dimer is negative, I think his swelling in the lower extremities is likely secondary to his CHF, BNP is unremarkable, will increase Lasix tomorrow morning to attempt to shift some of his fluids. Patient is breathing better after nebulized albuterol and ipratropium will give the patient an albuterol inhaler for any  transient shortness of breath or wheezing at home. To followup with Dr. Orvan Falconer in 2 days.  I explained the diagnosis and have given explicit precautions to return to the ER including chest pain, shortness of breath or any other new or worsening symptoms. The patient understands and accepts the medical plan as it's been dictated and I have answered their questions. Discharge instructions concerning home care and prescriptions have been given.  The patient is STABLE and is discharged to home in good condition.        Jones Skene, MD 08/31/12 [phone]

## 2012-09-15 ENCOUNTER — Encounter: Payer: Self-pay | Admitting: Family

## 2012-09-15 ENCOUNTER — Ambulatory Visit (INDEPENDENT_AMBULATORY_CARE_PROVIDER_SITE_OTHER): Payer: Medicare Other | Admitting: Family

## 2012-09-15 VITALS — BP 132/80 | HR 71 | Wt >= 6400 oz

## 2012-09-15 DIAGNOSIS — R600 Localized edema: Secondary | ICD-10-CM

## 2012-09-15 DIAGNOSIS — I509 Heart failure, unspecified: Secondary | ICD-10-CM

## 2012-09-15 DIAGNOSIS — R609 Edema, unspecified: Secondary | ICD-10-CM

## 2012-09-15 LAB — BASIC METABOLIC PANEL
CO2: 26 mEq/L (ref 19–32)
Calcium: 8.6 mg/dL (ref 8.4–10.5)
Chloride: 106 mEq/L (ref 96–112)
Glucose, Bld: 92 mg/dL (ref 70–99)
Sodium: 139 mEq/L (ref 135–145)

## 2012-09-15 NOTE — Progress Notes (Signed)
Subjective:    Patient ID: Derrick Mata, male    DOB: Jan 14, 1951, 62 y.o.   MRN: [id number]  HPI 62 year old AAM, smoker, is in today as a hospital follow-up from 08/31/2012. He was found to be fluid overload with peripheral edema with wheezing. Was told to take an extra Lasix x 2 days and his SOB improved and peripheral edema has improved and wheezing resolved. He has a history of CHF and has not seen a cardiologist in several years.  Denies chest pain or palpitations.  He's been referred to the bariatric clinic but has not followed up. However, he feels mentally prepared now to lose weight. He will like some assistance through the bariatric clinic.   Review of Systems  Constitutional: Negative.   HENT: Negative.   Respiratory: Positive for shortness of breath and wheezing.   Cardiovascular: Positive for leg swelling. Negative for chest pain and palpitations.  Gastrointestinal: Negative.   Endocrine: Negative.   Genitourinary: Negative.   Musculoskeletal: Negative.   Allergic/Immunologic: Negative.   Neurological: Negative.   Psychiatric/Behavioral: Negative.    Past Medical History  Diagnosis Date  . Sleep apnea   . Anxiety   . Arthritis   . CHF (congestive heart failure)   . Substance abuse   . Depression   . GERD (gastroesophageal reflux disease)   . Shortness of breath   . COPD (chronic obstructive pulmonary disease)     History   Social History  . Marital Status: Married    Spouse Name: N/A    Number of Children: N/A  . Years of Education: N/A   Occupational History  . Not on file.   Social History Main Topics  . Smoking status: Current Every Day Smoker -- 1.00 packs/day    Types: Cigarettes  . Smokeless tobacco: Not on file  . Alcohol Use: Yes     Comment: DRINKS 1/2 PINT LIQUOR DAILY  . Drug Use: No  . Sexually Active: Not on file     Comment: states no alcohol in 3 weeks   Other Topics Concern  . Not on file   Social History Narrative  . No  narrative on file    Past Surgical History  Procedure Laterality Date  . Lumbar laminectomy  1994, 2010  . Tibia fracture surgery  2006    hardware  . Knee arthroscopy  2011    left  . Tonsillectomy  1963  . Colonoscopy  04/18/2011    Procedure: COLONOSCOPY;  Surgeon: Yancey Flemings, MD;  Location: [facility];  Service: Endoscopy;  Laterality: N/A;    Family History  Problem Relation Age of Onset  . Colon cancer Sister   . Stomach cancer Neg Hx   . Rectal cancer Neg Hx     Allergies  Allergen Reactions  . Penicillins     REACTION: nausea  . Sulfamethoxazole     REACTION: urticaria (hives)    Current Outpatient Prescriptions on File Prior to Visit  Medication Sig Dispense Refill  . aspirin EC 81 MG tablet Take 81 mg by mouth daily.       . Aspirin-Caffeine (ANACIN MAX STRENGTH PO) Take 1-2 tablets by mouth daily as needed (for pain).       Marland Kitchen buPROPion (WELLBUTRIN XL) 150 MG 24 hr tablet Take 150 mg by mouth daily.      . furosemide (LASIX) 40 MG tablet Take 40 mg by mouth daily.      Marland Kitchen omeprazole (PRILOSEC) 20 MG capsule  Take 20 mg by mouth daily.      . potassium chloride SA (K-DUR,KLOR-CON) 20 MEQ tablet Take 20 mEq by mouth daily.      . traZODone (DESYREL) 150 MG tablet Take 1 tablet (150 mg total) by mouth at bedtime.  30 tablet  3  . Triamcinolone Acetonide (TRIAMCINOLONE 0.1 % CREAM : EUCERIN) CREA Apply 1 application topically 2 (two) times daily.  1 each  1   No current facility-administered medications on file prior to visit.    BP 132/80  Pulse 71  Wt 461 lb 3.2 oz (209.199 kg)  BMI 60.02 kg/m2  SpO2 96%chart     Objective:   Physical Exam  Constitutional: He is oriented to person, place, and time. He appears well-developed and well-nourished.  HENT:  Right Ear: External ear normal.  Left Ear: External ear normal.  Mouth/Throat: Oropharynx is clear and moist.  Neck: Normal range of motion. Neck supple.  Cardiovascular: Normal rate, regular rhythm and  normal heart sounds.   Pulmonary/Chest: Effort normal and breath sounds normal.  Abdominal: Soft. Bowel sounds are normal. There is no tenderness. There is no rebound and no guarding.  Musculoskeletal: Normal range of motion. He exhibits edema.  1-2+ pitting edema noted bilaterally  Neurological: He is alert and oriented to person, place, and time.  Skin: Skin is warm and dry.  Psychiatric: He has a normal mood and affect.          Assessment & Plan:  Assessment: 1. Peripheral Edema 2. Obesity 3. Congestive Heart Failure  Plan: Increase Lasix to 40 mg and 80 mg alternating days. Refer to cardiology. Low-sodium diet. Exercise to reduce weight. Refer to the area after clinic for assistance. BMP sent today to evaluate potassium. Call the office with any questions or concerns. Followup as discussed.

## 2012-09-15 NOTE — Patient Instructions (Addendum)
1. Lasix 40mg  and 80mg  alternating daily. See cardiology as scheduled.   Peripheral Edema You have swelling in your legs (peripheral edema). This swelling is due to excess accumulation of salt and water in your body. Edema may be a sign of heart, kidney or liver disease, or a side effect of a medication. It may also be due to problems in the leg veins. Elevating your legs and using special support stockings may be very helpful, if the cause of the swelling is due to poor venous circulation. Avoid long periods of standing, whatever the cause. Treatment of edema depends on identifying the cause. Chips, pretzels, pickles and other salty foods should be avoided. Restricting salt in your diet is almost always needed. Water pills (diuretics) are often used to remove the excess salt and water from your body via urine. These medicines prevent the kidney from reabsorbing sodium. This increases urine flow. Diuretic treatment may also result in lowering of potassium levels in your body. Potassium supplements may be needed if you have to use diuretics daily. Daily weights can help you keep track of your progress in clearing your edema. You should call your caregiver for follow up care as recommended. SEEK IMMEDIATE MEDICAL CARE IF:   You have increased swelling, pain, redness, or heat in your legs.  You develop shortness of breath, especially when lying down.  You develop chest or abdominal pain, weakness, or fainting.  You have a fever. Document Released: 06/13/2004 Document Revised: 07/29/2011 Document Reviewed: 05/24/2009 [facility] Patient Information 2013 [place], [place].

## 2012-10-07 ENCOUNTER — Other Ambulatory Visit: Payer: Self-pay | Admitting: Family

## 2012-10-09 ENCOUNTER — Encounter: Payer: Self-pay | Admitting: Cardiovascular Disease

## 2012-10-09 ENCOUNTER — Ambulatory Visit (INDEPENDENT_AMBULATORY_CARE_PROVIDER_SITE_OTHER): Payer: Medicare Other | Admitting: Cardiovascular Disease

## 2012-10-09 VITALS — BP 136/80 | HR 88 | Ht 73.0 in | Wt >= 6400 oz

## 2012-10-09 DIAGNOSIS — R0989 Other specified symptoms and signs involving the circulatory and respiratory systems: Secondary | ICD-10-CM

## 2012-10-09 DIAGNOSIS — G4730 Sleep apnea, unspecified: Secondary | ICD-10-CM

## 2012-10-09 DIAGNOSIS — R0609 Other forms of dyspnea: Secondary | ICD-10-CM

## 2012-10-09 DIAGNOSIS — Z8679 Personal history of other diseases of the circulatory system: Secondary | ICD-10-CM

## 2012-10-09 DIAGNOSIS — R0600 Dyspnea, unspecified: Secondary | ICD-10-CM

## 2012-10-09 DIAGNOSIS — Z01810 Encounter for preprocedural cardiovascular examination: Secondary | ICD-10-CM

## 2012-10-09 DIAGNOSIS — I10 Essential (primary) hypertension: Secondary | ICD-10-CM

## 2012-10-09 NOTE — Assessment & Plan Note (Signed)
Refer back to pulmonary to see if there is anything that can be done for him to tolerate CPAP better

## 2012-10-09 NOTE — Patient Instructions (Addendum)
Your physician recommends that you schedule a follow-up appointment in: AS NEEDED Your physician recommends that you continue on your current medications as directed. Please refer to the Current Medication list given to you today. Your physician has requested that you have an echocardiogram. Echocardiography is a painless test that uses sound waves to create images of your heart. It provides your doctor with information about the size and shape of your heart and how well your heart's chambers and valves are working. This procedure takes approximately one hour. There are no restrictions for this procedure. WITH  IV CONTRAST  You have been referred to DR [facility] FOR SLEEP APNEA AND  HAVING CPAP ISSUES

## 2012-10-09 NOTE — Assessment & Plan Note (Signed)
Well controlled.  Continue  low sodium Dash type diet.

## 2012-10-09 NOTE — Assessment & Plan Note (Signed)
He is clear to have bariatric surgery Told him I would not have knee surgery as it would not last with obesity.  Will do echo with contrast to make sure EF still normal

## 2012-10-09 NOTE — Progress Notes (Signed)
Patient ID: Derrick Mata, male   DOB: 1950-09-12, 62 y.o.   MRN: [id number] 62 yo patient of Dr Derrick Mata. Referred for possible pre op clearance.  He is morbidly obese. Last evaluation in 2008 echo was normal with no valve disease and EF 60%.  He carries diagnosis of CHF which might be diastolic but his real issue is obesity which leads to LE edema. Mobility is limited by worn out knees.  He takes lasix daily for his dependant edema Has Dr Derrick Mata name at [facility] for bariatric surgery but has not started process of evaluation.  Has OSA but not able to use CPAP and needs referral to pulmonary.  No chest pain Exertional dyspnea and tachycardia from obesity.    ROS: Denies fever, malais, weight loss, blurry vision, decreased visual acuity, cough, sputum, SOB, hemoptysis, pleuritic pain, palpitaitons, heartburn, abdominal pain, melena, lower extremity edema, claudication, or rash.  All other systems reviewed and negative   General: Affect appropriate Obese black male HEENT: normal Neck supple with no adenopathy JVP normal no bruits no thyromegaly Lungs clear with no wheezing and good diaphragmatic motion Heart:  S1/S2 no murmur,rub, gallop or click PMI normal Abdomen: benighn, BS positve, no tenderness, no AAA no bruit.  No HSM or HJR Distal pulses intact with no bruits Plus one bilateral  edema Neuro non-focal Skin warm and dry No muscular weakness  Medications Current Outpatient Prescriptions  Medication Sig Dispense Refill  . aspirin EC 81 MG tablet Take 81 mg by mouth daily.       Marland Kitchen buPROPion (WELLBUTRIN XL) 150 MG 24 hr tablet Take 150 mg by mouth daily.      . furosemide (LASIX) 40 MG tablet Take 40 mg by mouth daily. Every other day he takes an extra      . MELOXICAM PO Take by mouth daily.      Marland Kitchen omeprazole (PRILOSEC) 20 MG capsule Take 20 mg by mouth daily.      . potassium chloride SA (K-DUR,KLOR-CON) 20 MEQ tablet Take 20 mEq by mouth daily.      . traZODone (DESYREL) 150 MG  tablet Take 1 tablet (150 mg total) by mouth at bedtime.  30 tablet  3  . Triamcinolone Acetonide (TRIAMCINOLONE 0.1 % CREAM : EUCERIN) CREA Apply 1 application topically 2 (two) times daily.  1 each  1   No current facility-administered medications for this visit.    Allergies Penicillins and Sulfamethoxazole  Family History: Family History  Problem Relation Age of Onset  . Colon cancer Sister   . Stomach cancer Neg Hx   . Rectal cancer Neg Hx     Social History: History   Social History  . Marital Status: Married    Spouse Name: N/A    Number of Children: N/A  . Years of Education: N/A   Occupational History  . Not on file.   Social History Main Topics  . Smoking status: Current Every Day Smoker -- 1.00 packs/day    Types: Cigarettes  . Smokeless tobacco: Not on file  . Alcohol Use: Yes     Comment: DRINKS 1/2 PINT LIQUOR DAILY  . Drug Use: No  . Sexually Active: Not on file     Comment: states no alcohol in 3 weeks   Other Topics Concern  . Not on file   Social History Narrative  . No narrative on file    Electrocardiogram:  4/14  SR rate 79 normal except for sinus arrhythmia which is  not clinically significant  Assessment and Plan

## 2012-10-13 ENCOUNTER — Other Ambulatory Visit: Payer: Self-pay

## 2012-10-13 MED ORDER — MELOXICAM 15 MG PO TABS: 15.0000 mg | ORAL_TABLET | Freq: Every day | ORAL | Status: DC

## 2012-10-22 ENCOUNTER — Ambulatory Visit (HOSPITAL_COMMUNITY): Payer: Medicare Other | Attending: Internal Medicine | Admitting: Radiology

## 2012-10-22 DIAGNOSIS — R0600 Dyspnea, unspecified: Secondary | ICD-10-CM

## 2012-10-22 DIAGNOSIS — Z8679 Personal history of other diseases of the circulatory system: Secondary | ICD-10-CM

## 2012-10-22 DIAGNOSIS — R0609 Other forms of dyspnea: Secondary | ICD-10-CM | POA: Insufficient documentation

## 2012-10-22 DIAGNOSIS — F17200 Nicotine dependence, unspecified, uncomplicated: Secondary | ICD-10-CM | POA: Insufficient documentation

## 2012-10-22 DIAGNOSIS — R0989 Other specified symptoms and signs involving the circulatory and respiratory systems: Secondary | ICD-10-CM | POA: Insufficient documentation

## 2012-10-22 DIAGNOSIS — I10 Essential (primary) hypertension: Secondary | ICD-10-CM | POA: Insufficient documentation

## 2012-10-22 DIAGNOSIS — R0602 Shortness of breath: Secondary | ICD-10-CM

## 2012-10-22 NOTE — Progress Notes (Signed)
Echocardiogram performed.  

## 2012-10-30 ENCOUNTER — Institutional Professional Consult (permissible substitution): Payer: Medicare Other | Admitting: Pulmonary Disease

## 2012-11-02 ENCOUNTER — Telehealth: Payer: Self-pay | Admitting: Family

## 2012-11-02 MED ORDER — FUROSEMIDE 40 MG PO TABS: 40.0000 mg | ORAL_TABLET | Freq: Two times a day (BID) | ORAL | Status: DC

## 2012-11-02 MED ORDER — TRAZODONE HCL 150 MG PO TABS: 150.0000 mg | ORAL_TABLET | Freq: Every day | ORAL | Status: DC

## 2012-11-02 NOTE — Telephone Encounter (Signed)
Pt now takes his Lasix once daily, but every other day he takes 2, so 30 for 30 is not enough. He is requesting we send a new rx with the right qty for 30 days to: [facility] on [address]. Pt also requesting refill on his TRAZODONE to teh same pharmacy. He has enough until tomorrow.

## 2012-11-03 ENCOUNTER — Telehealth: Payer: Self-pay | Admitting: *Deleted

## 2012-11-03 NOTE — Telephone Encounter (Signed)
Normal echo just mild biatrial enlargement    ----- Message -----    From: Alois Cliche, LPN    Sent: 1/61/0960 9:09 AM    To: Wendall Stade, MD        NOT REVIEWED BY YOU ./CY           PT AWARE OF ECHO RESULTS./CY

## 2012-11-17 ENCOUNTER — Telehealth: Payer: Self-pay | Admitting: Pulmonary Disease

## 2012-11-17 ENCOUNTER — Encounter: Payer: Self-pay | Admitting: Pulmonary Disease

## 2012-11-17 ENCOUNTER — Ambulatory Visit (INDEPENDENT_AMBULATORY_CARE_PROVIDER_SITE_OTHER): Payer: Medicare Other | Admitting: Pulmonary Disease

## 2012-11-17 VITALS — BP 150/80 | HR 85 | Temp 98.0°F | Ht 71.0 in | Wt >= 6400 oz

## 2012-11-17 DIAGNOSIS — G4733 Obstructive sleep apnea (adult) (pediatric): Secondary | ICD-10-CM

## 2012-11-17 NOTE — Progress Notes (Signed)
Subjective:    Patient ID: Derrick Mata, male    DOB: Feb 01, 1951, 62 y.o.   MRN: [id number]  HPI The patient is a 62 year old male been asked to see for management of obstructive sleep apnea.  He was initially diagnosed in 1998, where he was started on CPAP.  He did very well with this at first, but then became to have chronic pain which caused him to move towards sleeping in a recliner.  He felt that a brief much better and recliner he thought he did not need a CPAP device.  He apparently did get a new machine approximately 5 years ago, and was kept on the automatic setting.  Unfortunately, the patient has not been tolerant of CPAP because it feels very uncomfortable.  He does not think he has a pressure problem, but rather has struggled with his mask fit.  He has not used his CPAP consistently for years, and tells me that he does not have a heated humidifier currently.  He tells me that his current machine is about 64 years old.  I did have an auto download available from 2013 which shows very poor compliance, and auto setting of 10-20 cm of water, in control of his AHI.  The patient continues to have very fragmented sleep with frequent awakenings, but has tried going back to sleeping in the bedroom.  He is not rested in the mornings upon arising, and notes definite inappropriate daytime sleepiness.  His epworth score today is 9.   Sleep Questionnaire What time do you typically go to bed?( Between what hours) [id number] at 1037 on 11/17/12 by Nita Sells, CMA How long does it take you to fall asleep? 27mins-1hr 77mins-1hr at 1037 on 11/17/12 by Nita Sells, CMA How many times during the night do you wake up? 4 4 at 1037 on 11/17/12 by Nita Sells, CMA What time do you get out of bed to start your day? [id number] 4-6a at 1037 on 11/17/12 by Nita Sells, CMA Do you drive or operate heavy machinery in your occupation? No No at 1037 on 11/17/12 by Nita Sells, CMA How much has  your weight changed (up or down) over the past two years? (In pounds) 10 lb (4.536 kg)10 lb (4.536 kg) 10lb decrease at 1037 on 11/17/12 by Nita Sells, CMA Have you ever had a sleep study before? Yes Yes at 1037 on 11/17/12 by Nita Sells, CMA If yes, location of study? 1998? 1998? at 1037 on 11/17/12 by Nita Sells, CMA If yes, date of study? 1998? 1998? at 1037 on 11/17/12 by Nita Sells, CMA Do you currently use CPAP? NoNo owns CPAP but not used in 4yr+ at 1037 on 11/17/12 by Marjo Bicker Mabe, CMA Do you wear oxygen at any time? No No at 1037 on 11/17/12 by Marjo Bicker Mabe, CMA   Review of Systems  Constitutional: Negative for fever and unexpected weight change.  HENT: Negative for ear pain, nosebleeds, congestion, sore throat, rhinorrhea, sneezing, trouble swallowing, dental problem, postnasal drip and sinus pressure.   Eyes: Negative for redness and itching.  Respiratory: Positive for shortness of breath. Negative for cough, chest tightness and wheezing.   Cardiovascular: Negative for palpitations and leg swelling.  Gastrointestinal: Negative for nausea and vomiting.  Genitourinary: Negative for dysuria.  Musculoskeletal: Negative for joint swelling.  Skin: Negative for rash.  Neurological: Negative for headaches.  Hematological: Does not bruise/bleed easily.  Psychiatric/Behavioral: Negative for dysphoric  mood. The patient is not nervous/anxious.        Objective:   Physical Exam Constitutional: morbidly obese male, no acute distress  HENT:  Nares patent without discharge, but deviated septum to left with narrowing.   Oropharynx without exudate, palate and uvula are elongated.   Eyes:  Perrla, eomi, no scleral icterus  Neck:  No JVD, no TMG  Cardiovascular:  Normal rate, regular rhythm, no rubs or gallops.  No murmurs        Intact distal pulses but decreased.   Pulmonary :  Normal breath sounds, no stridor or respiratory distress   No rales, rhonchi, or  wheezing  Abdominal:  Soft, nondistended, bowel sounds present.  No tenderness noted.   Musculoskeletal:  2+ lower extremity edema noted.  Lymph Nodes:  No cervical lymphadenopathy noted  Skin:  No cyanosis noted  Neurologic:  Alert, appropriate, moves all 4 extremities without obvious deficit.         Assessment & Plan:

## 2012-11-17 NOTE — Assessment & Plan Note (Signed)
The patient has a history of obstructive sleep apnea dating back to 1998, but has been very noncompliant with CPAP.  He cites as his major issue the mask fit, and I think it would be helpful for him to go to the sleep center for a formal mask fitting.  The patient states that he is due for a new machine, and we will see if this is true.  I would like to keep him on the automatic setting for now, and work with him on desensitization.  Finally, I stressed to him the importance of working aggressively on weight loss.

## 2012-11-17 NOTE — Telephone Encounter (Signed)
Noted. [facility] does not have the study either.  KC aware.  Nothing further needed at this time.

## 2012-11-17 NOTE — Patient Instructions (Addendum)
Will refer you to the sleep center during the day for a formal mask fitting.  Will see if you qualify for a new cpap machine, and will keep on the auto setting for now. Work on weight loss followup with me in 6 weeks to check on things, but call if you are having issues with tolerance.

## 2012-11-18 ENCOUNTER — Ambulatory Visit (HOSPITAL_BASED_OUTPATIENT_CLINIC_OR_DEPARTMENT_OTHER): Payer: Medicare Other | Attending: Pulmonary Disease | Admitting: Radiology

## 2012-11-18 DIAGNOSIS — G4733 Obstructive sleep apnea (adult) (pediatric): Secondary | ICD-10-CM

## 2012-12-22 ENCOUNTER — Other Ambulatory Visit: Payer: Self-pay | Admitting: Family

## 2012-12-31 ENCOUNTER — Ambulatory Visit: Payer: Medicare Other | Admitting: Pulmonary Disease

## 2013-01-01 ENCOUNTER — Ambulatory Visit: Payer: Medicare Other | Admitting: Pulmonary Disease

## 2013-01-05 ENCOUNTER — Encounter: Payer: Self-pay | Admitting: Pulmonary Disease

## 2013-01-08 ENCOUNTER — Other Ambulatory Visit: Payer: Self-pay | Admitting: Family

## 2013-01-26 ENCOUNTER — Encounter: Payer: Self-pay | Admitting: Pulmonary Disease

## 2013-01-26 ENCOUNTER — Ambulatory Visit (INDEPENDENT_AMBULATORY_CARE_PROVIDER_SITE_OTHER): Payer: Medicare Other | Admitting: Pulmonary Disease

## 2013-01-26 DIAGNOSIS — G4733 Obstructive sleep apnea (adult) (pediatric): Secondary | ICD-10-CM

## 2013-01-26 NOTE — Assessment & Plan Note (Signed)
No visit.  Never got machine.

## 2013-01-26 NOTE — Progress Notes (Signed)
  Subjective:    Patient ID: Derrick Mata, male    DOB: 1950/11/20, 62 y.o.   MRN: [id number]  HPI No visit.   Review of Systems  Constitutional: Negative for fever and unexpected weight change.  HENT: Negative for ear pain, nosebleeds, congestion, sore throat, rhinorrhea, sneezing, trouble swallowing, dental problem, postnasal drip and sinus pressure.   Eyes: Negative for redness and itching.  Respiratory: Negative for cough, chest tightness, shortness of breath and wheezing.   Cardiovascular: Negative for palpitations and leg swelling.  Gastrointestinal: Negative for nausea and vomiting.  Genitourinary: Negative for dysuria.  Musculoskeletal: Negative for joint swelling.  Skin: Negative for rash.  Neurological: Negative for headaches.  Hematological: Does not bruise/bleed easily.  Psychiatric/Behavioral: Negative for dysphoric mood. The patient is not nervous/anxious.        Objective:   Physical Exam        Assessment & Plan:

## 2013-01-26 NOTE — Patient Instructions (Addendum)
No visit.  Never got cpap device.

## 2013-03-09 ENCOUNTER — Encounter: Payer: Self-pay | Admitting: Pulmonary Disease

## 2013-03-09 ENCOUNTER — Ambulatory Visit (INDEPENDENT_AMBULATORY_CARE_PROVIDER_SITE_OTHER): Payer: Medicare Other | Admitting: Pulmonary Disease

## 2013-03-09 VITALS — BP 142/88 | HR 81 | Temp 97.8°F | Ht 71.0 in | Wt >= 6400 oz

## 2013-03-09 DIAGNOSIS — G4733 Obstructive sleep apnea (adult) (pediatric): Secondary | ICD-10-CM

## 2013-03-09 NOTE — Patient Instructions (Signed)
Keep working on mask fit.   Work on weight loss followup with me in one year, but call if having issues with wearing cpap.

## 2013-03-09 NOTE — Progress Notes (Signed)
  Subjective:    Patient ID: Derrick Mata, male    DOB: June 17, 1950, 62 y.o.   MRN: [id number]  HPI Patient comes in today for followup of his obstructive sleep apnea.  He did get a new CPAP machine that is set on the automatic at 10-20 cm.  He has done very well with this, and feels that his sleep has improved as has his daytime alertness.  He is having some mask issues at this time, but it is unclear if he was ever given the mask that was fitted at the sleep Center.  I have asked him to check on this.   Review of Systems  Constitutional: Negative for fever and unexpected weight change.  HENT: Negative for congestion, dental problem, ear pain, nosebleeds, postnasal drip, rhinorrhea, sinus pressure, sneezing, sore throat and trouble swallowing.   Eyes: Negative for redness and itching.  Respiratory: Negative for cough, chest tightness, shortness of breath and wheezing.   Cardiovascular: Negative for palpitations and leg swelling.  Gastrointestinal: Negative for nausea and vomiting.  Genitourinary: Negative for dysuria.  Musculoskeletal: Negative for joint swelling.  Skin: Negative for rash.  Neurological: Negative for headaches.  Hematological: Does not bruise/bleed easily.  Psychiatric/Behavioral: Negative for dysphoric mood. The patient is not nervous/anxious.        Objective:   Physical Exam Morbidly obese male in no acute distress Nose without purulence or discharge noted No skin breakdown or pressure necrosis from the CPAP mask Neck without lymphadenopathy or thyromegaly Lower extremities with edema noted, cyanosis Alert and oriented, moves all 4 extremities, does not appear to be sleepy.       Assessment & Plan:

## 2013-03-09 NOTE — Assessment & Plan Note (Signed)
The patient is doing fairly well with his new CPAP device, but needs to continue working on a better mask fit.  He feels that he is sleeping well with the device, and has seen increased energy during the day.  I have asked him to keep up with his mask changes and supplies, and to work aggressively on weight loss.

## 2013-04-24 ENCOUNTER — Other Ambulatory Visit: Payer: Self-pay | Admitting: Family

## 2013-05-23 ENCOUNTER — Other Ambulatory Visit: Payer: Self-pay | Admitting: Family

## 2013-06-27 ENCOUNTER — Other Ambulatory Visit: Payer: Self-pay | Admitting: Family

## 2013-07-29 ENCOUNTER — Other Ambulatory Visit: Payer: Self-pay | Admitting: Family

## 2013-08-04 ENCOUNTER — Other Ambulatory Visit (INDEPENDENT_AMBULATORY_CARE_PROVIDER_SITE_OTHER): Payer: Medicare Other

## 2013-08-04 DIAGNOSIS — I10 Essential (primary) hypertension: Secondary | ICD-10-CM

## 2013-08-04 DIAGNOSIS — Z0000 Encounter for general adult medical examination without abnormal findings: Secondary | ICD-10-CM

## 2013-08-04 LAB — CBC WITH DIFFERENTIAL/PLATELET
Basophils Absolute: 0 10*3/uL (ref 0.0–0.1)
Basophils Relative: 0.2 % (ref 0.0–3.0)
EOS ABS: 0 10*3/uL (ref 0.0–0.7)
EOS PCT: 0.6 % (ref 0.0–5.0)
HCT: 45 % (ref 39.0–52.0)
Hemoglobin: 14.6 g/dL (ref 13.0–17.0)
LYMPHS PCT: 27.2 % (ref 12.0–46.0)
Lymphs Abs: 2.3 10*3/uL (ref 0.7–4.0)
MCHC: 32.5 g/dL (ref 30.0–36.0)
MCV: 85.8 fl (ref 78.0–100.0)
MONO ABS: 0.5 10*3/uL (ref 0.1–1.0)
Monocytes Relative: 6.2 % (ref 3.0–12.0)
NEUTROS PCT: 65.8 % (ref 43.0–77.0)
Neutro Abs: 5.5 10*3/uL (ref 1.4–7.7)
Platelets: 233 10*3/uL (ref 150.0–400.0)
RBC: 5.25 Mil/uL (ref 4.22–5.81)
RDW: 14.6 % (ref 11.5–14.6)
WBC: 8.3 10*3/uL (ref 4.5–10.5)

## 2013-08-04 LAB — POCT URINALYSIS DIPSTICK
Bilirubin, UA: NEGATIVE
Blood, UA: NEGATIVE
GLUCOSE UA: NEGATIVE
Ketones, UA: NEGATIVE
NITRITE UA: NEGATIVE
Protein, UA: NEGATIVE
Spec Grav, UA: 1.02
UROBILINOGEN UA: 1
pH, UA: 7

## 2013-08-04 LAB — HEPATIC FUNCTION PANEL
ALT: 32 U/L (ref 0–53)
AST: 42 U/L — ABNORMAL HIGH (ref 0–37)
Albumin: 3.3 g/dL — ABNORMAL LOW (ref 3.5–5.2)
Alkaline Phosphatase: 58 U/L (ref 39–117)
BILIRUBIN DIRECT: 0.1 mg/dL (ref 0.0–0.3)
BILIRUBIN TOTAL: 0.6 mg/dL (ref 0.3–1.2)
Total Protein: 7 g/dL (ref 6.0–8.3)

## 2013-08-04 LAB — LIPID PANEL
CHOLESTEROL: 147 mg/dL (ref 0–200)
HDL: 53.5 mg/dL (ref >39.00)
LDL Cholesterol: 75 mg/dL (ref 0–99)
TRIGLYCERIDES: 94 mg/dL (ref 0.0–149.0)
Total CHOL/HDL Ratio: 3
VLDL: 18.8 mg/dL (ref 0.0–40.0)

## 2013-08-04 LAB — BASIC METABOLIC PANEL
BUN: 11 mg/dL (ref 6–23)
CALCIUM: 8.6 mg/dL (ref 8.4–10.5)
CO2: 28 mEq/L (ref 19–32)
CREATININE: 0.7 mg/dL (ref 0.4–1.5)
Chloride: 103 mEq/L (ref 96–112)
GFR: 145.75 mL/min (ref >60.00)
Glucose, Bld: 95 mg/dL (ref 70–99)
Potassium: 4.3 mEq/L (ref 3.5–5.1)
Sodium: 137 mEq/L (ref 135–145)

## 2013-08-04 LAB — TSH: TSH: 1.35 u[IU]/mL (ref 0.35–5.50)

## 2013-08-04 LAB — PSA: PSA: 0.67 ng/mL (ref 0.10–4.00)

## 2013-08-11 ENCOUNTER — Telehealth: Payer: Self-pay | Admitting: Family

## 2013-08-11 ENCOUNTER — Encounter: Payer: Self-pay | Admitting: Family

## 2013-08-11 ENCOUNTER — Ambulatory Visit (INDEPENDENT_AMBULATORY_CARE_PROVIDER_SITE_OTHER): Payer: Medicare Other | Admitting: Family

## 2013-08-11 VITALS — BP 120/78 | HR 78 | Ht 71.0 in | Wt >= 6400 oz

## 2013-08-11 DIAGNOSIS — K219 Gastro-esophageal reflux disease without esophagitis: Secondary | ICD-10-CM

## 2013-08-11 DIAGNOSIS — E876 Hypokalemia: Secondary | ICD-10-CM

## 2013-08-11 DIAGNOSIS — M1990 Unspecified osteoarthritis, unspecified site: Secondary | ICD-10-CM

## 2013-08-11 DIAGNOSIS — N529 Male erectile dysfunction, unspecified: Secondary | ICD-10-CM

## 2013-08-11 DIAGNOSIS — Z0000 Encounter for general adult medical examination without abnormal findings: Secondary | ICD-10-CM

## 2013-08-11 MED ORDER — SILDENAFIL CITRATE 100 MG PO TABS: 50.0000 mg | ORAL_TABLET | Freq: Every day | ORAL | Status: DC | PRN

## 2013-08-11 MED ORDER — POTASSIUM CHLORIDE CRYS ER 20 MEQ PO TBCR: 20.0000 meq | EXTENDED_RELEASE_TABLET | Freq: Every day | ORAL | Status: DC

## 2013-08-11 NOTE — Patient Instructions (Signed)
Erectile Dysfunction  Erectile dysfunction is the inability to get or sustain a good enough erection to have sexual intercourse. Erectile dysfunction may involve:   Inability to get an erection.   Lack of enough hardness to allow penetration.   Loss of the erection before sex is finished.   Premature ejaculation.  CAUSES   Certain drugs, such as:   Pain relievers.   Antihistamines.   Antidepressants.   Blood pressure medicines.   Water pills (diuretics).   Ulcer medicines.   Muscle relaxants.   Illegal drugs.   Excessive drinking.   Psychological causes, such as:   Anxiety.   Depression.   Sadness.   Exhaustion.   Performance fear.   Stress.   Physical causes, such as:   Artery problems. This may include diabetes, smoking, liver disease, or atherosclerosis.   High blood pressure.   Hormonal problems, such as low testosterone.   Obesity.   Nerve problems. This may include back or pelvic injuries, diabetes mellitus, multiple sclerosis, or Parkinson disease.  SYMPTOMS   Inability to get an erection.   Lack of enough hardness to allow penetration.   Loss of the erection before sex is finished.   Premature ejaculation.   Normal erections at some times, but with frequent unsatisfactory episodes.   Orgasms that are not satisfactory in sensation or frequency.   Low sexual satisfaction in either partner because of erection problems.   A curved penis occurring with erection. The curve may cause pain or may be too curved to allow for intercourse.   Never having nighttime erections.  DIAGNOSIS  Your caregiver can often diagnose this condition by:   Performing a physical exam to find other diseases or specific problems with the penis.   Asking you detailed questions about the problem.   Performing blood tests to check for diabetes mellitus or to measure hormone levels.   Performing urine tests to find other underlying health conditions.   Performing an ultrasound exam to check for  scarring.   Performing a test to check blood flow to the penis.   Doing a sleep study at home to measure nighttime erections.  TREATMENT    You may be prescribed medicines by mouth.   You may be given medicine injections into the penis.   You may be prescribed a vacuum pump with a ring.   Penile implant surgery may be performed. You may receive:   An inflatable implant.   A semirigid implant.   Blood vessel surgery may be performed.  HOME CARE INSTRUCTIONS   If you are prescribed oral medicine, you should take the medicine as prescribed. Do not increase the dosage without first discussing it with your physician.   If you are using self-injections, be careful to avoid any veins that are on the surface of the penis. Apply pressure to the injection site for 5 minutes.   If you are using a vacuum pump, make sure you have read the instructions before using it. Discuss any questions with your physician before taking the pump home.  SEEK MEDICAL CARE IF:   You experience pain that is not responsive to the pain medicine you have been prescribed.   You experience nausea or vomiting.  SEEK IMMEDIATE MEDICAL CARE IF:    When taking oral or injectable medications, you experience an erection that lasts longer than 4 hours. If your physician is unavailable, go to the nearest emergency room for evaluation. An erection that lasts much longer than 4 hours can   result in permanent damage to your penis.   You have pain that is severe.   You develop redness, severe pain, or severe swelling of your penis.   You have redness spreading up into your groin or lower abdomen.   You are unable to pass your urine.  Document Released: 05/03/2000 Document Revised: 01/06/2013 Document Reviewed: 10/08/2012  [employer] Patient Information 2014 [employer].

## 2013-08-11 NOTE — Telephone Encounter (Signed)
Relevant patient education assigned to patient using Emmi. ° °

## 2013-08-11 NOTE — Progress Notes (Signed)
Subjective:    Patient ID: Derrick Mata, male    DOB: Mar 15, 1951, 63 y.o.   MRN: [id number]  HPI 63 year old AAM, Patient presents for yearly preventative medicine examination. Medicare questionnaire was completed Has concerns of erectile dysfunction.   All immunizations and health maintenance protocols were reviewed with the patient and needed orders were placed.  Appropriate screening laboratory values were ordered for the patient including screening of hyperlipidemia, renal function and hepatic function.If indicated by BPH, a PSA was ordered.  Medication reconciliation,  past medical history, social history, problem list and allergies were reviewed in detail with the patient  Goals were established with regard to weight loss, exercise, and  diet in compliance with medications  End of life planning was discussed.    Review of Systems  Constitutional: Negative.   HENT: Negative.   Eyes: Negative.   Respiratory: Negative.   Cardiovascular: Negative.   Gastrointestinal: Negative.   Endocrine: Negative.   Genitourinary: Negative.   Musculoskeletal: Negative.   Skin: Negative.   Allergic/Immunologic: Negative.   Neurological: Negative.   Hematological: Negative.   Psychiatric/Behavioral: Negative.    Past Medical History  Diagnosis Date  . Sleep apnea   . Anxiety   . Arthritis   . CHF (congestive heart failure)   . Substance abuse   . Depression   . GERD (gastroesophageal reflux disease)   . Shortness of breath   . COPD (chronic obstructive pulmonary disease)     History   Social History  . Marital Status: Married    Spouse Name: N/A    Number of Children: N/A  . Years of Education: N/A   Occupational History  . retired    Social History Main Topics  . Smoking status: Current Every Day Smoker -- 1.00 packs/day for 51 years    Types: Cigarettes    Start date: 05/21/1963  . Smokeless tobacco: Not on file  . Alcohol Use: Yes     Comment: DRINKS 1/2  PINT LIQUOR DAILY----QUIT 2012  . Drug Use: No  . Sexual Activity: Not on file     Comment: states no alcohol in 3 weeks   Other Topics Concern  . Not on file   Social History Narrative  . No narrative on file    Past Surgical History  Procedure Laterality Date  . Lumbar laminectomy  1994, 2010  . Tibia fracture surgery  2006    hardware  . Knee arthroscopy  2011    left  . Tonsillectomy  1963  . Colonoscopy  04/18/2011    Procedure: COLONOSCOPY;  Surgeon: Scarlette Shorts, MD;  Location: [facility];  Service: Endoscopy;  Laterality: N/A;    Family History  Problem Relation Age of Onset  . Colon cancer Sister   . Stomach cancer Neg Hx   . Rectal cancer Neg Hx   . Breast cancer Mother   . Heart disease Other     Allergies  Allergen Reactions  . Penicillins     REACTION: nausea  . Sulfamethoxazole     REACTION: urticaria (hives)    Current Outpatient Prescriptions on File Prior to Visit  Medication Sig Dispense Refill  . aspirin EC 81 MG tablet Take 81 mg by mouth daily.       Marland Kitchen buPROPion (WELLBUTRIN XL) 150 MG 24 hr tablet Take 150 mg by mouth daily.      . furosemide (LASIX) 40 MG tablet Take 40 mg by mouth daily.      Marland Kitchen  meloxicam (MOBIC) 15 MG tablet TAKE 1 TABLET BY MOUTH EVERY DAY  90 tablet  0  . meloxicam (MOBIC) 15 MG tablet TAKE 1 TABLET BY MOUTH EVERY DAY  90 tablet  0  . omeprazole (PRILOSEC) 20 MG capsule TAKE ONE CAPSULE BY MOUTH DAILY      . traZODone (DESYREL) 150 MG tablet TAKE 1 TABLET BY MOUTH AT BEDTIME  90 tablet  0  . Triamcinolone Acetonide (TRIAMCINOLONE 0.1 % CREAM : EUCERIN) CREA Apply 1 application topically 2 (two) times daily.  1 each  1   No current facility-administered medications on file prior to visit.    BP 120/78  Pulse 78  Ht 5\' 11"  (1.803 m)  Wt 453 lb 11.2 oz (205.797 kg)  BMI 63.31 kg/m2  SpO2 97%chart    Objective:   Physical Exam  Constitutional: He is oriented to person, place, and time. He appears well-developed and  well-nourished.  HENT:  Head: Normocephalic.  Right Ear: External ear normal.  Left Ear: External ear normal.  Nose: Nose normal.  Mouth/Throat: Oropharynx is clear and moist.  Eyes: Conjunctivae and EOM are normal. Pupils are equal, round, and reactive to light.  Neck: Normal range of motion. Neck supple. No thyromegaly present.  Cardiovascular: Normal rate, regular rhythm and normal heart sounds.   Pulmonary/Chest: Effort normal and breath sounds normal.  Abdominal: Soft. Bowel sounds are normal.  Genitourinary: Rectum normal and prostate normal. Guaiac negative stool. No penile tenderness.  Musculoskeletal: Normal range of motion. He exhibits no edema and no tenderness.  Neurological: He is alert and oriented to person, place, and time. He has normal reflexes. He displays normal reflexes. No cranial nerve deficit. Coordination normal.  Skin: Skin is warm and dry.  Psychiatric: He has a normal mood and affect.          Assessment & Plan:  Derrick Mata was seen today for annual exam.  Diagnoses and associated orders for this visit:  Preventative health care  GERD (gastroesophageal reflux disease)  Hypokalemia  Osteoarthritis  Erectile dysfunction  Other Orders - potassium chloride SA (K-DUR,KLOR-CON) 20 MEQ tablet; Take 1 tablet (20 mEq total) by mouth daily. - sildenafil (VIAGRA) 100 MG tablet; Take 0.5-1 tablets (50-100 mg total) by mouth daily as needed for erectile dysfunction.   Call the office with any questions or concerns. Recheck as scheduled in 6 months and as needed.

## 2013-09-09 ENCOUNTER — Other Ambulatory Visit: Payer: Self-pay | Admitting: Family

## 2013-10-25 ENCOUNTER — Other Ambulatory Visit: Payer: Self-pay | Admitting: Family

## 2013-10-28 ENCOUNTER — Telehealth: Payer: Self-pay | Admitting: Family

## 2013-10-28 MED ORDER — BUPROPION HCL ER (XL) 150 MG PO TB24: 150.0000 mg | ORAL_TABLET | Freq: Every day | ORAL | Status: DC

## 2013-10-28 NOTE — Telephone Encounter (Signed)
Rx sent 

## 2013-10-28 NOTE — Telephone Encounter (Signed)
[id number] - [place], [place] is requesting re-fill on buPROPion (WELLBUTRIN XL) 150 MG 24 hr tablet

## 2013-11-17 ENCOUNTER — Other Ambulatory Visit: Payer: Self-pay | Admitting: Family

## 2014-01-26 ENCOUNTER — Other Ambulatory Visit: Payer: Self-pay | Admitting: Family

## 2014-02-02 ENCOUNTER — Other Ambulatory Visit: Payer: Self-pay | Admitting: Family

## 2014-02-16 ENCOUNTER — Telehealth: Payer: Self-pay | Admitting: Family

## 2014-02-16 NOTE — Telephone Encounter (Signed)
Error

## 2014-02-22 ENCOUNTER — Telehealth: Payer: Self-pay | Admitting: Family

## 2014-02-22 NOTE — Telephone Encounter (Signed)
Padonda wants pt to schedule 15 mins FU appointment.  She received an order from for wrist, hand, finger orthosis and needs to evaluate the pt.  LMOM for pt to call office and schedule appointment.

## 2014-02-25 ENCOUNTER — Encounter: Payer: Self-pay | Admitting: Family

## 2014-02-25 ENCOUNTER — Ambulatory Visit (INDEPENDENT_AMBULATORY_CARE_PROVIDER_SITE_OTHER): Payer: Medicare Other | Admitting: Family

## 2014-02-25 VITALS — BP 142/88 | HR 106 | Wt >= 6400 oz

## 2014-02-25 DIAGNOSIS — M179 Osteoarthritis of knee, unspecified: Secondary | ICD-10-CM

## 2014-02-25 DIAGNOSIS — I10 Essential (primary) hypertension: Secondary | ICD-10-CM

## 2014-02-25 DIAGNOSIS — Z23 Encounter for immunization: Secondary | ICD-10-CM

## 2014-02-25 DIAGNOSIS — E876 Hypokalemia: Secondary | ICD-10-CM

## 2014-02-25 DIAGNOSIS — M1711 Unilateral primary osteoarthritis, right knee: Secondary | ICD-10-CM

## 2014-02-25 LAB — HEPATIC FUNCTION PANEL
ALT: 27 U/L (ref 0–53)
AST: 39 U/L — ABNORMAL HIGH (ref 0–37)
Albumin: 3 g/dL — ABNORMAL LOW (ref 3.5–5.2)
Alkaline Phosphatase: 62 U/L (ref 39–117)
BILIRUBIN TOTAL: 0.5 mg/dL (ref 0.2–1.2)
Bilirubin, Direct: 0.2 mg/dL (ref 0.0–0.3)
Total Protein: 8 g/dL (ref 6.0–8.3)

## 2014-02-25 LAB — BASIC METABOLIC PANEL
BUN: 11 mg/dL (ref 6–23)
CO2: 24 mEq/L (ref 19–32)
CREATININE: 1 mg/dL (ref 0.4–1.5)
Calcium: 9 mg/dL (ref 8.4–10.5)
Chloride: 107 mEq/L (ref 96–112)
GFR: 99.85 mL/min (ref >60.00)
Glucose, Bld: 111 mg/dL — ABNORMAL HIGH (ref 70–99)
POTASSIUM: 3.8 meq/L (ref 3.5–5.1)
Sodium: 142 mEq/L (ref 135–145)

## 2014-02-25 MED ORDER — TRAMADOL HCL 50 MG PO TABS: 50.0000 mg | ORAL_TABLET | Freq: Two times a day (BID) | ORAL | Status: DC | PRN

## 2014-02-25 NOTE — Progress Notes (Signed)
Pre visit review using our clinic review tool, if applicable. No additional management support is needed unless otherwise documented below in the visit note. 

## 2014-02-25 NOTE — Patient Instructions (Signed)

## 2014-02-28 ENCOUNTER — Telehealth: Payer: Self-pay | Admitting: Family

## 2014-02-28 NOTE — Telephone Encounter (Signed)
emmi emailed °

## 2014-03-01 NOTE — Progress Notes (Signed)
Subjective:    Patient ID: Derrick Mata, male    DOB: [birth year], 63 y.o.   MRN: [id number]  HPI  63 year old with complaints of bilateral knee pain that he seen orthopedics for anany any replacement. His pain 8/10 worse with walking. Has reduced his weight and attempt to try to decrease pain but continues to weigh 438 pounds. He also reports bilateral wrist pain. Pain worse in the right. Has difficulty opening jars. Has meloxicam that he takes periodically. Is inquiring about tramadol for pain. Has a history of alcohol dependence in the past.  Review of Systems  Constitutional: Negative.   Respiratory: Negative.   Cardiovascular: Negative.   Endocrine: Negative.   Genitourinary: Negative.   Musculoskeletal: Positive for arthralgias.       Bilateral knee pain and wrist pain   Skin: Negative.   Allergic/Immunologic: Negative.   Neurological: Negative.   Psychiatric/Behavioral: Negative.    Past Medical History  Diagnosis Date  . Sleep apnea   . Anxiety   . Arthritis   . CHF (congestive heart failure)   . Substance abuse   . Depression   . GERD (gastroesophageal reflux disease)   . Shortness of breath   . COPD (chronic obstructive pulmonary disease)     History   Social History  . Marital Status: Married    Spouse Name: N/A    Number of Children: N/A  . Years of Education: N/A   Occupational History  . retired    Social History Main Topics  . Smoking status: Current Every Day Smoker -- 1.00 packs/day for 51 years    Types: Cigarettes    Start date: 05/21/1963  . Smokeless tobacco: Not on file  . Alcohol Use: Yes     Comment: DRINKS 1/2 PINT LIQUOR DAILY----QUIT 2012  . Drug Use: No  . Sexual Activity: Not on file     Comment: states no alcohol in 3 weeks   Other Topics Concern  . Not on file   Social History Narrative  . No narrative on file    Past Surgical History  Procedure Laterality Date  . Lumbar laminectomy  1994, 2010  . Tibia fracture  surgery  2006    hardware  . Knee arthroscopy  2011    left  . Tonsillectomy  1963  . Colonoscopy  04/18/2011    Procedure: COLONOSCOPY;  Surgeon: Scarlette Shorts, MD;  Location: [facility];  Service: Endoscopy;  Laterality: N/A;    Family History  Problem Relation Age of Onset  . Colon cancer Sister   . Stomach cancer Neg Hx   . Rectal cancer Neg Hx   . Breast cancer Mother   . Heart disease Other     Allergies  Allergen Reactions  . Penicillins     REACTION: nausea  . Sulfamethoxazole     REACTION: urticaria (hives)    Current Outpatient Prescriptions on File Prior to Visit  Medication Sig Dispense Refill  . aspirin EC 81 MG tablet Take 81 mg by mouth daily.       Marland Kitchen buPROPion (WELLBUTRIN XL) 150 MG 24 hr tablet TAKE 1 TABLET BY MOUTH EVERY DAY  90 tablet  0  . furosemide (LASIX) 40 MG tablet Take 40 mg by mouth daily.      . furosemide (LASIX) 40 MG tablet TAKE 1 TABLET BY MOUTH TWICE DAILY  180 tablet  0  . omeprazole (PRILOSEC) 20 MG capsule TAKE ONE CAPSULE BY MOUTH DAILY      .  omeprazole (PRILOSEC) 20 MG capsule TAKE ONE CAPSULE BY MOUTH TWICE DAILY  60 capsule  3  . potassium chloride SA (K-DUR,KLOR-CON) 20 MEQ tablet Take 1 tablet (20 mEq total) by mouth daily.  90 tablet  1  . sildenafil (VIAGRA) 100 MG tablet Take 0.5-1 tablets (50-100 mg total) by mouth daily as needed for erectile dysfunction.  3 tablet  0  . traZODone (DESYREL) 150 MG tablet TAKE 1 TABLET BY MOUTH AT BEDTIME  90 tablet  0  . Triamcinolone Acetonide (TRIAMCINOLONE 0.1 % CREAM : EUCERIN) CREA Apply 1 application topically 2 (two) times daily.  1 each  1   No current facility-administered medications on file prior to visit.    BP 142/88  Pulse 106  Wt 438 lb (198.675 kg)chart    Objective:   Physical Exam  Constitutional: He is oriented to person, place, and time. He appears well-developed and well-nourished.  HENT:  Right Ear: External ear normal.  Left Ear: External ear normal.  Nose:  Nose normal.  Mouth/Throat: Oropharynx is clear and moist.  Neck: Normal range of motion. Neck supple.  Cardiovascular: Normal rate, regular rhythm and normal heart sounds.   Pulmonary/Chest: Effort normal and breath sounds normal.  Abdominal: Soft. Bowel sounds are normal.  Musculoskeletal: He exhibits tenderness.  Tenderness to palpation of the knee bilaterally. No significant effusion. Pain with flexion and extension.    Wrist pain to palpation laterally. Pain with flexion and extension. No swelling.   Neurological: He is alert and oriented to person, place, and time.  Skin: Skin is warm and dry.  Psychiatric: He has a normal mood and affect.          Assessment & Plan:  Derrick Mata was seen today for no specified reason.  Diagnoses and associated orders for this visit:  Osteoarthritis of right knee, unspecified osteoarthritis type  Essential hypertension, benign - Basic Metabolic Panel - Hepatic Function Panel  Hypokalemia - Basic Metabolic Panel - Hepatic Function Panel  Other Orders - traMADol (ULTRAM) 50 MG tablet; Take 1 tablet (50 mg total) by mouth 2 (two) times daily as needed.    Due to h/o alcohol dependence, we had a long discussion about the use of tramadol. This will be short term only.   Encouraged him to take meloxicam on a daily basis with food. He wear of abdominal pain potential for GI bleed from ulcers. Consider seeing orthopedics again.

## 2014-03-04 ENCOUNTER — Telehealth: Payer: Self-pay | Admitting: Family

## 2014-03-04 NOTE — Telephone Encounter (Signed)
[place] [facility] following up on knee and wrist brace for pt. order was confirmed oct 6 from our office that we received, waiting on response [phone] for any questions Ref Id [id number]

## 2014-03-07 NOTE — Telephone Encounter (Signed)
Request faxed

## 2014-03-10 ENCOUNTER — Telehealth: Payer: Self-pay | Admitting: Family

## 2014-03-10 NOTE — Telephone Encounter (Signed)
Mr Derrick Mata called and would like a call back. He said he has some questions.

## 2014-03-11 NOTE — Telephone Encounter (Signed)
Pt aware that order was faxed to [place] indicating need for wrist brace. We will need medical supply company to send form for knee braces. He states he will have them send it in

## 2014-03-18 ENCOUNTER — Telehealth: Payer: Self-pay | Admitting: Family

## 2014-03-18 NOTE — Telephone Encounter (Signed)
Pt lost tramdol rx and needs new rx tramadol 50 mg  sent to [facility]

## 2014-03-21 NOTE — Telephone Encounter (Signed)
Pt aware, per [place], cannot refill controlled substance until scheduled time

## 2014-03-25 ENCOUNTER — Ambulatory Visit: Payer: Medicare Other | Admitting: Pulmonary Disease

## 2014-03-28 ENCOUNTER — Other Ambulatory Visit: Payer: Self-pay | Admitting: Family

## 2014-06-24 ENCOUNTER — Other Ambulatory Visit: Payer: Self-pay | Admitting: Family

## 2014-07-01 ENCOUNTER — Other Ambulatory Visit: Payer: Self-pay | Admitting: Family

## 2014-07-08 ENCOUNTER — Other Ambulatory Visit: Payer: Self-pay | Admitting: Family

## 2014-08-20 ENCOUNTER — Other Ambulatory Visit: Payer: Self-pay | Admitting: Family

## 2014-08-22 ENCOUNTER — Other Ambulatory Visit (INDEPENDENT_AMBULATORY_CARE_PROVIDER_SITE_OTHER): Payer: Medicare Other

## 2014-08-22 ENCOUNTER — Other Ambulatory Visit: Payer: Self-pay | Admitting: Family

## 2014-08-22 ENCOUNTER — Other Ambulatory Visit: Payer: Self-pay

## 2014-08-22 DIAGNOSIS — E785 Hyperlipidemia, unspecified: Secondary | ICD-10-CM | POA: Diagnosis not present

## 2014-08-22 DIAGNOSIS — Z125 Encounter for screening for malignant neoplasm of prostate: Secondary | ICD-10-CM

## 2014-08-22 DIAGNOSIS — R300 Dysuria: Secondary | ICD-10-CM

## 2014-08-22 DIAGNOSIS — Z0000 Encounter for general adult medical examination without abnormal findings: Secondary | ICD-10-CM

## 2014-08-22 LAB — BASIC METABOLIC PANEL
BUN: 11 mg/dL (ref 6–23)
CO2: 29 meq/L (ref 19–32)
Calcium: 9.2 mg/dL (ref 8.4–10.5)
Chloride: 105 mEq/L (ref 96–112)
Creatinine, Ser: 0.8 mg/dL (ref 0.40–1.50)
GFR: 124.53 mL/min (ref >60.00)
Glucose, Bld: 98 mg/dL (ref 70–99)
Potassium: 4.6 mEq/L (ref 3.5–5.1)
SODIUM: 139 meq/L (ref 135–145)

## 2014-08-22 LAB — LIPID PANEL
Cholesterol: 155 mg/dL (ref 0–200)
HDL: 44.4 mg/dL (ref >39.00)
LDL Cholesterol: 90 mg/dL (ref 0–99)
NONHDL: 110.6
Total CHOL/HDL Ratio: 3
Triglycerides: 101 mg/dL (ref 0.0–149.0)
VLDL: 20.2 mg/dL (ref 0.0–40.0)

## 2014-08-22 LAB — TSH: TSH: 3.25 u[IU]/mL (ref 0.35–4.50)

## 2014-08-22 LAB — POCT URINALYSIS DIPSTICK
Glucose, UA: NEGATIVE
Leukocytes, UA: NEGATIVE
Nitrite, UA: NEGATIVE
PH UA: 6
SPEC GRAV UA: 1.025
Urobilinogen, UA: >=8

## 2014-08-22 LAB — CBC WITH DIFFERENTIAL/PLATELET
BASOS ABS: 0 10*3/uL (ref 0.0–0.1)
Basophils Relative: 0.3 % (ref 0.0–3.0)
EOS ABS: 0 10*3/uL (ref 0.0–0.7)
EOS PCT: 0.4 % (ref 0.0–5.0)
HEMATOCRIT: 46.3 % (ref 39.0–52.0)
Hemoglobin: 15.2 g/dL (ref 13.0–17.0)
LYMPHS ABS: 2.2 10*3/uL (ref 0.7–4.0)
Lymphocytes Relative: 23.5 % (ref 12.0–46.0)
MCHC: 32.8 g/dL (ref 30.0–36.0)
MCV: 84.7 fl (ref 78.0–100.0)
MONO ABS: 0.5 10*3/uL (ref 0.1–1.0)
Monocytes Relative: 5.7 % (ref 3.0–12.0)
Neutro Abs: 6.6 10*3/uL (ref 1.4–7.7)
Neutrophils Relative %: 70.1 % (ref 43.0–77.0)
PLATELETS: 265 10*3/uL (ref 150.0–400.0)
RBC: 5.47 Mil/uL (ref 4.22–5.81)
RDW: 14.8 % (ref 11.5–15.5)
WBC: 9.4 10*3/uL (ref 4.0–10.5)

## 2014-08-22 LAB — HEPATIC FUNCTION PANEL
ALBUMIN: 3.5 g/dL (ref 3.5–5.2)
ALK PHOS: 58 U/L (ref 39–117)
ALT: 23 U/L (ref 0–53)
AST: 30 U/L (ref 0–37)
BILIRUBIN DIRECT: 0.2 mg/dL (ref 0.0–0.3)
Total Bilirubin: 0.6 mg/dL (ref 0.2–1.2)
Total Protein: 7.2 g/dL (ref 6.0–8.3)

## 2014-08-22 LAB — PSA: PSA: 0.9 ng/mL (ref 0.10–4.00)

## 2014-08-22 MED ORDER — TRAMADOL HCL 50 MG PO TABS: 50.0000 mg | ORAL_TABLET | Freq: Two times a day (BID) | ORAL | Status: DC | PRN

## 2014-08-22 MED ORDER — BUPROPION HCL ER (XL) 150 MG PO TB24: 150.0000 mg | ORAL_TABLET | Freq: Every day | ORAL | Status: DC

## 2014-08-22 NOTE — Addendum Note (Signed)
Addended by: Westley Hummer B on: 08/22/2014 12:07 PM   Modules accepted: Orders

## 2014-08-24 LAB — URINE CULTURE
Colony Count: NO GROWTH
Organism ID, Bacteria: NO GROWTH

## 2014-08-25 ENCOUNTER — Ambulatory Visit (INDEPENDENT_AMBULATORY_CARE_PROVIDER_SITE_OTHER): Payer: Medicare Other | Admitting: Family

## 2014-08-25 ENCOUNTER — Encounter: Payer: Self-pay | Admitting: Family

## 2014-08-25 VITALS — BP 140/80 | HR 100 | Temp 98.1°F | Ht 71.0 in | Wt >= 6400 oz

## 2014-08-25 DIAGNOSIS — F329 Major depressive disorder, single episode, unspecified: Secondary | ICD-10-CM

## 2014-08-25 DIAGNOSIS — Z720 Tobacco use: Secondary | ICD-10-CM

## 2014-08-25 DIAGNOSIS — F32A Depression, unspecified: Secondary | ICD-10-CM

## 2014-08-25 DIAGNOSIS — E669 Obesity, unspecified: Secondary | ICD-10-CM

## 2014-08-25 DIAGNOSIS — I10 Essential (primary) hypertension: Secondary | ICD-10-CM

## 2014-08-25 MED ORDER — BUPROPION HCL ER (XL) 150 MG PO TB24: 150.0000 mg | ORAL_TABLET | Freq: Every day | ORAL | Status: DC

## 2014-08-25 MED ORDER — POTASSIUM CHLORIDE CRYS ER 20 MEQ PO TBCR: 20.0000 meq | EXTENDED_RELEASE_TABLET | Freq: Every day | ORAL | Status: DC

## 2014-08-25 NOTE — Patient Instructions (Signed)
Exercise to Stay Healthy Exercise helps you become and stay healthy. EXERCISE IDEAS AND TIPS Choose exercises that:  You enjoy.  Fit into your day. You do not need to exercise really hard to be healthy. You can do exercises at a slow or medium level and stay healthy. You can:  Stretch before and after working out.  Try yoga, Pilates, or tai chi.  Lift weights.  Walk fast, swim, jog, run, climb stairs, bicycle, dance, or rollerskate.  Take aerobic classes. Exercises that burn about 150 calories:  Running 1  miles in 15 minutes.  Playing volleyball for 45 to 60 minutes.  Washing and waxing a car for 45 to 60 minutes.  Playing touch football for 45 minutes.  Walking 1  miles in 35 minutes.  Pushing a stroller 1  miles in 30 minutes.  Playing basketball for 30 minutes.  Raking leaves for 30 minutes.  Bicycling 5 miles in 30 minutes.  Walking 2 miles in 30 minutes.  Dancing for 30 minutes.  Shoveling snow for 15 minutes.  Swimming laps for 20 minutes.  Walking up stairs for 15 minutes.  Bicycling 4 miles in 15 minutes.  Gardening for 30 to 45 minutes.  Jumping rope for 15 minutes.  Washing windows or floors for 45 to 60 minutes. Document Released: 06/08/2010 Document Revised: 07/29/2011 Document Reviewed: 06/08/2010 [facility] Patient Information 2015 [place], [place]. This information is not intended to replace advice given to you by your health care provider. Make sure you discuss any questions you have with your health care provider. Smoking Cessation Quitting smoking is important to your health and has many advantages. However, it is not always easy to quit since nicotine is a very addictive drug. Oftentimes, people try 3 times or more before being able to quit. This document explains the best ways for you to prepare to quit smoking. Quitting takes hard work and a lot of effort, but you can do it. ADVANTAGES OF QUITTING SMOKING  You will live longer, feel  better, and live better.  Your body will feel the impact of quitting smoking almost immediately.  Within 20 minutes, blood pressure decreases. Your pulse returns to its normal level.  After 8 hours, carbon monoxide levels in the blood return to normal. Your oxygen level increases.  After 24 hours, the chance of having a heart attack starts to decrease. Your breath, hair, and body stop smelling like smoke.  After 48 hours, damaged nerve endings begin to recover. Your sense of taste and smell improve.  After 72 hours, the body is virtually free of nicotine. Your bronchial tubes relax and breathing becomes easier.  After 2 to 12 weeks, lungs can hold more air. Exercise becomes easier and circulation improves.  The risk of having a heart attack, stroke, cancer, or lung disease is greatly reduced.  After 1 year, the risk of coronary heart disease is cut in half.  After 5 years, the risk of stroke falls to the same as a nonsmoker.  After 10 years, the risk of lung cancer is cut in half and the risk of other cancers decreases significantly.  After 15 years, the risk of coronary heart disease drops, usually to the level of a nonsmoker.  If you are pregnant, quitting smoking will improve your chances of having a healthy baby.  The people you live with, especially any children, will be healthier.  You will have extra money to spend on things other than cigarettes. QUESTIONS TO THINK ABOUT BEFORE ATTEMPTING TO  QUIT You may want to talk about your answers with your health care provider.  Why do you want to quit?  If you tried to quit in the past, what helped and what did not?  What will be the most difficult situations for you after you quit? How will you plan to handle them?  Who can help you through the tough times? Your family? Friends? A health care provider?  What pleasures do you get from smoking? What ways can you still get pleasure if you quit? Here are some questions to ask  your health care provider:  How can you help me to be successful at quitting?  What medicine do you think would be best for me and how should I take it?  What should I do if I need more help?  What is smoking withdrawal like? How can I get information on withdrawal? GET READY  Set a quit date.  Change your environment by getting rid of all cigarettes, ashtrays, matches, and lighters in your home, car, or work. Do not let people smoke in your home.  Review your past attempts to quit. Think about what worked and what did not. GET SUPPORT AND ENCOURAGEMENT You have a better chance of being successful if you have help. You can get support in many ways.  Tell your family, friends, and coworkers that you are going to quit and need their support. Ask them not to smoke around you.  Get individual, group, or telephone counseling and support. Programs are available at [employer] and health centers. Call your local health department for information about programs in your area.  Spiritual beliefs and practices may help some smokers quit.  Download a "quit meter" on your computer to keep track of quit statistics, such as how long you have gone without smoking, cigarettes not smoked, and money saved.  Get a self-help book about quitting smoking and staying off tobacco. [place] yourself from urges to smoke. Talk to someone, go for a walk, or occupy your time with a task.  Change your normal routine. Take a different route to work. Drink tea instead of coffee. Eat breakfast in a different place.  Reduce your stress. Take a hot bath, exercise, or read a book.  Plan something enjoyable to do every day. Reward yourself for not smoking.  Explore interactive web-based programs that specialize in helping you quit. GET MEDICINE AND USE IT CORRECTLY Medicines can help you stop smoking and decrease the urge to smoke. Combining medicine with the above behavioral  methods and support can greatly increase your chances of successfully quitting smoking.  Nicotine replacement therapy helps deliver nicotine to your body without the negative effects and risks of smoking. Nicotine replacement therapy includes nicotine gum, lozenges, inhalers, nasal sprays, and skin patches. Some may be available over-the-counter and others require a prescription.  Antidepressant medicine helps people abstain from smoking, but how this works is unknown. This medicine is available by prescription.  Nicotinic receptor partial agonist medicine simulates the effect of nicotine in your brain. This medicine is available by prescription. Ask your health care provider for advice about which medicines to use and how to use them based on your health history. Your health care provider will tell you what side effects to look out for if you choose to be on a medicine or therapy. Carefully read the information on the package. Do not use any other product containing nicotine while using a nicotine replacement product.  RELAPSE OR DIFFICULT SITUATIONS Most relapses occur within the first 3 months after quitting. Do not be discouraged if you start smoking again. Remember, most people try several times before finally quitting. You may have symptoms of withdrawal because your body is used to nicotine. You may crave cigarettes, be irritable, feel very hungry, cough often, get headaches, or have difficulty concentrating. The withdrawal symptoms are only temporary. They are strongest when you first quit, but they will go away within 10-14 days. To reduce the chances of relapse, try to:  Avoid drinking alcohol. Drinking lowers your chances of successfully quitting.  Reduce the amount of caffeine you consume. Once you quit smoking, the amount of caffeine in your body increases and can give you symptoms, such as a rapid heartbeat, sweating, and anxiety.  Avoid smokers because they can make you want to  smoke.  Do not let weight gain distract you. Many smokers will gain weight when they quit, usually less than 10 pounds. Eat a healthy diet and stay active. You can always lose the weight gained after you quit.  Find ways to improve your mood other than smoking. FOR MORE INFORMATION  www.smokefree.gov  Document Released: 04/30/2001 Document Revised: 09/20/2013 Document Reviewed: 08/15/2011 [facility] Patient Information 2015 [place], [place]. This information is not intended to replace advice given to you by your health care provider. Make sure you discuss any questions you have with your health care provider. DASH Eating Plan DASH stands for "Dietary Approaches to Stop Hypertension." The DASH eating plan is a healthy eating plan that has been shown to reduce high blood pressure (hypertension). Additional health benefits may include reducing the risk of type 2 diabetes mellitus, heart disease, and stroke. The DASH eating plan may also help with weight loss. WHAT DO I NEED TO KNOW ABOUT THE DASH EATING PLAN? For the DASH eating plan, you will follow these general guidelines:  Choose foods with a percent daily value for sodium of less than 5% (as listed on the food label).  Use salt-free seasonings or herbs instead of table salt or sea salt.  Check with your health care provider or pharmacist before using salt substitutes.  Eat lower-sodium products, often labeled as "lower sodium" or "no salt added."  Eat fresh foods.  Eat more vegetables, fruits, and low-fat dairy products.  Choose whole grains. Look for the word "whole" as the first word in the ingredient list.  Choose fish and skinless chicken or [place] more often than red meat. Limit fish, poultry, and meat to 6 oz (170 g) each day.  Limit sweets, desserts, sugars, and sugary drinks.  Choose heart-healthy fats.  Limit cheese to 1 oz (28 g) per day.  Eat more home-cooked food and less restaurant, buffet, and fast food.  Limit  fried foods.  Cook foods using methods other than frying.  Limit canned vegetables. If you do use them, rinse them well to decrease the sodium.  When eating at a restaurant, ask that your food be prepared with less salt, or no salt if possible. WHAT FOODS CAN I EAT? Seek help from a dietitian for individual calorie needs. Grains Whole grain or whole wheat bread. Brown rice. Whole grain or whole wheat pasta. Quinoa, bulgur, and whole grain cereals. Low-sodium cereals. Corn or whole wheat flour tortillas. Whole grain cornbread. Whole grain crackers. Low-sodium crackers. Vegetables Fresh or frozen vegetables (raw, steamed, roasted, or grilled). Low-sodium or reduced-sodium tomato and vegetable juices. Low-sodium or reduced-sodium tomato sauce and paste. Low-sodium or reduced-sodium canned  vegetables.  Fruits All fresh, canned (in natural juice), or frozen fruits. Meat and Other Protein Products Ground beef (85% or leaner), grass-fed beef, or beef trimmed of fat. Skinless chicken or [place]. Ground chicken or [place]. Pork trimmed of fat. All fish and seafood. Eggs. Dried beans, peas, or lentils. Unsalted nuts and seeds. Unsalted canned beans. Dairy Low-fat dairy products, such as skim or 1% milk, 2% or reduced-fat cheeses, low-fat ricotta or cottage cheese, or plain low-fat yogurt. Low-sodium or reduced-sodium cheeses. Fats and Oils Tub margarines without trans fats. Light or reduced-fat mayonnaise and salad dressings (reduced sodium). Avocado. Safflower, olive, or canola oils. Natural peanut or almond butter. Other Unsalted popcorn and pretzels. The items listed above may not be a complete list of recommended foods or beverages. Contact your dietitian for more options. WHAT FOODS ARE NOT RECOMMENDED? Grains White bread. White pasta. White rice. Refined cornbread. Bagels and croissants. Crackers that contain trans fat. Vegetables Creamed or fried vegetables. Vegetables in a cheese sauce.  Regular canned vegetables. Regular canned tomato sauce and paste. Regular tomato and vegetable juices. Fruits Dried fruits. Canned fruit in light or heavy syrup. Fruit juice. Meat and Other Protein Products Fatty cuts of meat. Ribs, chicken wings, bacon, sausage, bologna, salami, chitterlings, fatback, hot dogs, bratwurst, and packaged luncheon meats. Salted nuts and seeds. Canned beans with salt. Dairy Whole or 2% milk, cream, half-and-half, and cream cheese. Whole-fat or sweetened yogurt. Full-fat cheeses or blue cheese. Nondairy creamers and whipped toppings. Processed cheese, cheese spreads, or cheese curds. Condiments Onion and garlic salt, seasoned salt, table salt, and sea salt. Canned and packaged gravies. Worcestershire sauce. Tartar sauce. Barbecue sauce. Teriyaki sauce. Soy sauce, including reduced sodium. Steak sauce. Fish sauce. Oyster sauce. Cocktail sauce. Horseradish. Ketchup and mustard. Meat flavorings and tenderizers. Bouillon cubes. Hot sauce. Tabasco sauce. Marinades. Taco seasonings. Relishes. Fats and Oils Butter, stick margarine, lard, shortening, ghee, and bacon fat. Coconut, palm kernel, or palm oils. Regular salad dressings. Other Pickles and olives. Salted popcorn and pretzels. The items listed above may not be a complete list of foods and beverages to avoid. Contact your dietitian for more information. WHERE CAN I FIND MORE INFORMATION? National Heart, Lung, and Blood Institute: travelstabloid.com Document Released: 04/25/2011 Document Revised: 09/20/2013 Document Reviewed: 03/10/2013 [facility] Patient Information 2015 [place], [place]. This information is not intended to replace advice given to you by your health care provider. Make sure you discuss any questions you have with your health care provider.

## 2014-08-25 NOTE — Progress Notes (Signed)
Pre visit review using our clinic review tool, if applicable. No additional management support is needed unless otherwise documented below in the visit note. 

## 2014-08-25 NOTE — Progress Notes (Signed)
Subjective:    Patient ID: Derrick Mata, male    DOB: Jan 10, 1951, 64 y.o.   MRN: [id number]  HPI Medication Follow-up:  Patient 64 year old, African American male, with past medical history of hypertensive, depression, current pack per day smoker, and obesity.  Seen today for medication follow-up and refills for Wellbutrin and Potassium.    He is currently stable on medication and denies any concerns.   Review of Systems  Constitutional: Negative.   HENT: Negative.   Eyes: Negative.   Respiratory: Negative.   Cardiovascular: Negative.   Gastrointestinal: Negative.   Endocrine: Negative.   Genitourinary: Negative.   Musculoskeletal: Negative.   Skin: Negative.   Allergic/Immunologic: Negative.   Neurological: Negative.   Hematological: Negative.   Psychiatric/Behavioral: Negative.   . Past Medical History  Diagnosis Date  . Sleep apnea   . Anxiety   . Arthritis   . CHF (congestive heart failure)   . Substance abuse   . Depression   . GERD (gastroesophageal reflux disease)   . Shortness of breath   . COPD (chronic obstructive pulmonary disease)     History   Social History  . Marital Status: Married    Spouse Name: N/A  . Number of Children: N/A  . Years of Education: N/A   Occupational History  . retired    Social History Main Topics  . Smoking status: Current Every Day Smoker -- 1.00 packs/day for 51 years    Types: Cigarettes    Start date: 05/21/1963  . Smokeless tobacco: Not on file  . Alcohol Use: Yes     Comment: DRINKS 1/2 PINT LIQUOR DAILY----QUIT 2012  . Drug Use: No  . Sexual Activity: Not on file     Comment: states no alcohol in 3 weeks   Other Topics Concern  . Not on file   Social History Narrative    Past Surgical History  Procedure Laterality Date  . Lumbar laminectomy  1994, 2010  . Tibia fracture surgery  2006    hardware  . Knee arthroscopy  2011    left  . Tonsillectomy  1963  . Colonoscopy  04/18/2011    Procedure:  COLONOSCOPY;  Surgeon: Scarlette Shorts, MD;  Location: [facility];  Service: Endoscopy;  Laterality: N/A;    Family History  Problem Relation Age of Onset  . Colon cancer Sister   . Stomach cancer Neg Hx   . Rectal cancer Neg Hx   . Breast cancer Mother   . Heart disease Other     Allergies  Allergen Reactions  . Penicillins     REACTION: nausea  . Sulfamethoxazole     REACTION: urticaria (hives)    Current Outpatient Prescriptions on File Prior to Visit  Medication Sig Dispense Refill  . aspirin EC 81 MG tablet Take 81 mg by mouth daily.     . furosemide (LASIX) 40 MG tablet TAKE 1 TABLET BY MOUTH TWICE DAILY 180 tablet 0  . omeprazole (PRILOSEC) 20 MG capsule TAKE ONE CAPSULE BY MOUTH DAILY    . sildenafil (VIAGRA) 100 MG tablet Take 0.5-1 tablets (50-100 mg total) by mouth daily as needed for erectile dysfunction. 3 tablet 0  . traMADol (ULTRAM) 50 MG tablet Take 1 tablet (50 mg total) by mouth 2 (two) times daily as needed. 60 tablet 0  . traZODone (DESYREL) 150 MG tablet TAKE 1 TABLET BY MOUTH AT BEDTIME 90 tablet 0  . Triamcinolone Acetonide (TRIAMCINOLONE 0.1 % CREAM :  EUCERIN) CREA Apply 1 application topically 2 (two) times daily. 1 each 1   No current facility-administered medications on file prior to visit.    BP 140/80 mmHg  Pulse 100  Temp(Src) 98.1 F (36.7 C) (Oral)  Ht 5\' 11"  (1.803 m)  Wt 420 lb (190.511 kg)  BMI 58.60 kg/m2chart     Objective:   Physical Exam  Constitutional: He is oriented to person, place, and time. He appears well-developed and well-nourished.  HENT:  Head: Normocephalic and atraumatic.  Right Ear: External ear normal.  Left Ear: External ear normal.  Nose: Nose normal.  Mouth/Throat: Oropharynx is clear and moist.  Eyes: Conjunctivae and EOM are normal. Pupils are equal, round, and reactive to light.  Neck: Normal range of motion. Neck supple.  Cardiovascular: Normal rate, regular rhythm, normal heart sounds and intact distal  pulses.   Pulmonary/Chest: Effort normal and breath sounds normal.  Musculoskeletal: Normal range of motion.  Neurological: He is alert and oriented to person, place, and time.  Skin: Skin is warm and dry.  Psychiatric: He has a normal mood and affect. His behavior is normal. Judgment and thought content normal.           Assessment & Plan:  Derrick Mata was seen today for follow-up.  Diagnoses and all orders for this visit:  Depression  Tobacco use  Obesity  Essential hypertension  Other orders -     potassium chloride SA (K-DUR,KLOR-CON) 20 MEQ tablet; Take 1 tablet (20 mEq total) by mouth daily. -     buPROPion (WELLBUTRIN XL) 150 MG 24 hr tablet; Take 1 tablet (150 mg total) by mouth daily.   Plan: Smoking cessation education provided. Recommended dash diet, walking for exercising 45 minutes, 3-4 times weekly to promote weight loss. Reviewed labs with patient, all are stable at present. Follow-up as needed.

## 2014-08-29 ENCOUNTER — Encounter: Payer: Medicare Other | Admitting: Family

## 2014-09-08 ENCOUNTER — Encounter: Payer: Self-pay | Admitting: Family

## 2014-09-08 ENCOUNTER — Ambulatory Visit (INDEPENDENT_AMBULATORY_CARE_PROVIDER_SITE_OTHER): Payer: Medicare Other | Admitting: Family

## 2014-09-08 VITALS — BP 138/82 | Temp 97.6°F | Ht 71.0 in | Wt >= 6400 oz

## 2014-09-08 DIAGNOSIS — M25571 Pain in right ankle and joints of right foot: Secondary | ICD-10-CM

## 2014-09-08 DIAGNOSIS — Z0000 Encounter for general adult medical examination without abnormal findings: Secondary | ICD-10-CM

## 2014-09-08 DIAGNOSIS — Z23 Encounter for immunization: Secondary | ICD-10-CM

## 2014-09-08 NOTE — Patient Instructions (Signed)
Cardiac Diet This diet can help prevent heart disease and stroke. Many factors influence your heart health, including eating and exercise habits. Coronary risk rises a lot with abnormal blood fat (lipid) levels. Cardiac meal planning includes limiting unhealthy fats, increasing healthy fats, and making other small dietary changes. General guidelines are as follows:  Adjust calorie intake to reach and maintain desirable body weight.  Limit total fat intake to less than 30% of total calories. Saturated fat should be less than 7% of calories.  Saturated fats are found in animal products and in some vegetable products. Saturated vegetable fats are found in coconut oil, cocoa butter, palm oil, and palm kernel oil. Read labels carefully to avoid these products as much as possible. Use butter in moderation. Choose tub margarines and oils that have 2 grams of fat or less. Good cooking oils are canola and olive oils.  Practice low-fat cooking techniques. Do not fry food. Instead, broil, bake, boil, steam, grill, roast on a rack, stir-fry, or microwave it. Other fat reducing suggestions include:  Remove the skin from poultry.  Remove all visible fat from meats.  Skim the fat off stews, soups, and gravies before serving them.  Steam vegetables in water or broth instead of sauting them in fat.  Avoid foods with trans fat (or hydrogenated oils), such as commercially fried foods and commercially baked goods. Commercial shortening and deep-frying fats will contain trans fat.  Increase intake of fruits, vegetables, whole grains, and legumes to replace foods high in fat.  Increase consumption of nuts, legumes, and seeds to at least 4 servings weekly. One serving of a legume equals  cup, and 1 serving of nuts or seeds equals  cup.  Choose whole grains more often. Have 3 servings per day (a serving is 1 ounce [oz]).  Eat 4 to 5 servings of vegetables per day. A serving of vegetables is 1 cup of raw leafy  vegetables;  cup of raw or cooked cut-up vegetables;  cup of vegetable juice.  Eat 4 to 5 servings of fruit per day. A serving of fruit is 1 medium whole fruit;  cup of dried fruit;  cup of fresh, frozen, or canned fruit;  cup of 100% fruit juice.  Increase your intake of dietary fiber to 20 to 30 grams per day. Insoluble fiber may help lower your risk of heart disease and may help curb your appetite.  Soluble fiber binds cholesterol to be removed from the blood. Foods high in soluble fiber are dried beans, citrus fruits, oats, apples, bananas, broccoli, Brussels sprouts, and eggplant.  Try to include foods fortified with plant sterols or stanols, such as yogurt, breads, juices, or margarines. Choose several fortified foods to achieve a daily intake of 2 to 3 grams of plant sterols or stanols.  Foods with omega-3 fats can help reduce your risk of heart disease. Aim to have a 3.5 oz portion of fatty fish twice per week, such as salmon, mackerel, albacore tuna, sardines, lake trout, or herring. If you wish to take a fish oil supplement, choose one that contains 1 gram of both DHA and EPA.  Limit processed meats to 2 servings (3 oz portion) weekly.  Limit the sodium in your diet to 1500 milligrams (mg) per day. If you have high blood pressure, talk to a registered dietitian about a DASH (Dietary Approaches to Stop Hypertension) eating plan.  Limit sweets and beverages with added sugar, such as soda, to no more than 5 servings per week. One  serving is:   1 tablespoon sugar.  1 tablespoon jelly or jam.   cup sorbet.  1 cup lemonade.   cup regular soda. CHOOSING FOODS Starches  Allowed: Breads: All kinds (wheat, rye, raisin, white, oatmeal, [place], [place], and English muffin bread). Low-fat rolls: English muffins, frankfurter and hamburger buns, bagels, pita bread, tortillas (not fried). Pancakes, waffles, biscuits, and muffins made with recommended oil.  Avoid: Products made with  saturated or trans fats, oils, or whole milk products. Butter rolls, cheese breads, croissants. Commercial doughnuts, muffins, sweet rolls, biscuits, waffles, pancakes, store-bought mixes. Crackers  Allowed: Low-fat crackers and snacks: Animal, graham, rye, saltine (with recommended oil, no lard), oyster, and matzo crackers. Bread sticks, melba toast, rusks, flatbread, pretzels, and light popcorn.  Avoid: High-fat crackers: cheese crackers, butter crackers, and those made with coconut, palm oil, or trans fat (hydrogenated oils). Buttered popcorn. Cereals  Allowed: Hot or cold whole-grain cereals.  Avoid: Cereals containing coconut, hydrogenated vegetable fat, or animal fat. Potatoes / Pasta / Rice  Allowed: All kinds of potatoes, rice, and pasta (such as macaroni, spaghetti, and noodles).  Avoid: Pasta or rice prepared with cream sauce or high-fat cheese. Chow mein noodles, [place] fries. Vegetables  Allowed: All vegetables and vegetable juices.  Avoid: Fried vegetables. Vegetables in cream, butter, or high-fat cheese sauces. Limit coconut. Fruit in cream or custard. Protein  Allowed: Limit your intake of meat, seafood, and poultry to no more than 6 oz (cooked weight) per day. All lean, well-trimmed beef, veal, pork, and lamb. All chicken and [place] without skin. All fish and shellfish. Wild game: wild duck, rabbit, pheasant, and venison. Egg whites or low-cholesterol egg substitutes may be used as desired. Meatless dishes: recipes with dried beans, peas, lentils, and tofu (soybean curd). Seeds and nuts: all seeds and most nuts.  Avoid: Prime grade and other heavily marbled and fatty meats, such as short ribs, spare ribs, rib eye roast or steak, frankfurters, sausage, bacon, and high-fat luncheon meats, mutton. Caviar. Commercially fried fish. Domestic duck, goose, venison sausage. Organ meats: liver, gizzard, heart, chitterlings, brains, kidney, sweetbreads. Dairy  Allowed: Low-fat  cheeses: nonfat or low-fat cottage cheese (1% or 2% fat), cheeses made with part skim milk, such as mozzarella, farmers, string, or ricotta. (Cheeses should be labeled no more than 2 to 6 grams fat per oz.). Skim (or 1%) milk: liquid, powdered, or evaporated. Buttermilk made with low-fat milk. Drinks made with skim or low-fat milk or cocoa. Chocolate milk or cocoa made with skim or low-fat (1%) milk. Nonfat or low-fat yogurt.  Avoid: Whole milk cheeses, including colby, cheddar, [place], Monterey Jack, [place], [place], [place], American, Swiss, and blue. Creamed cottage cheese, cream cheese. Whole milk and whole milk products, including buttermilk or yogurt made from whole milk, drinks made from whole milk. Condensed milk, evaporated whole milk, and 2% milk. Soups and Combination Foods  Allowed: Low-fat low-sodium soups: broth, dehydrated soups, homemade broth, soups with the fat removed, homemade cream soups made with skim or low-fat milk. Low-fat spaghetti, lasagna, chili, and Spanish rice if low-fat ingredients and low-fat cooking techniques are used.  Avoid: Cream soups made with whole milk, cream, or high-fat cheese. All other soups. Desserts and Sweets  Allowed: Sherbet, fruit ices, gelatins, meringues, and angel food cake. Homemade desserts with recommended fats, oils, and milk products. Jam, jelly, honey, marmalade, sugars, and syrups. Pure sugar candy, such as gum drops, hard candy, jelly beans, marshmallows, mints, and small amounts of dark chocolate.  Avoid: Commercially prepared  cakes, pies, cookies, frosting, pudding, or mixes for these products. Desserts containing whole milk products, chocolate, coconut, lard, palm oil, or palm kernel oil. Ice cream or ice cream drinks. Candy that contains chocolate, coconut, butter, hydrogenated fat, or unknown ingredients. Buttered syrups. Fats and Oils  Allowed: Vegetable oils: safflower, sunflower, corn, soybean, cottonseed, sesame, canola, olive,  or peanut. Non-hydrogenated margarines. Salad dressing or mayonnaise: homemade or commercial, made with a recommended oil. Low or nonfat salad dressing or mayonnaise.  Limit added fats and oils to 6 to 8 tsp per day (includes fats used in cooking, baking, salads, and spreads on bread). Remember to count the "hidden fats" in foods.  Avoid: Solid fats and shortenings: butter, lard, salt pork, bacon drippings. Gravy containing meat fat, shortening, or suet. Cocoa butter, coconut. Coconut oil, palm oil, palm kernel oil, or hydrogenated oils: these ingredients are often used in bakery products, nondairy creamers, whipped toppings, candy, and commercially fried foods. Read labels carefully. Salad dressings made of unknown oils, sour cream, or cheese, such as blue cheese and [place]ef[place]. Cream, all kinds: half-and-half, light, heavy, or whipping. Sour cream or cream cheese (even if "light" or low-fat). Nondairy cream substitutes: coffee creamers and sour cream substitutes made with palm, palm kernel, hydrogenated oils, or coconut oil. Beverages  Allowed: Coffee (regular or decaffeinated), tea. Diet carbonated beverages, mineral water. Alcohol: Check with your caregiver. Moderation is recommended.  Avoid: Whole milk, regular sodas, and juice drinks with added sugar. Condiments  Allowed: All seasonings and condiments. Cocoa powder. "Cream" sauces made with recommended ingredients.  Avoid: Carob powder made with hydrogenated fats. SAMPLE MENU Breakfast   cup orange juice   cup oatmeal  1 slice toast  1 tsp margarine  1 cup skim milk Lunch  [place] sandwich with 2 oz [place], 2 slices bread  Lettuce and tomato slices  Fresh fruit  Carrot sticks  Coffee or tea Snack  Fresh fruit or low-fat crackers Dinner  3 oz lean ground beef  1 baked potato  1 tsp margarine   cup asparagus  Lettuce salad  1 tbs non-creamy dressing   cup peach slices  1 cup skim milk Document Released:  02/13/2008 Document Revised: 11/05/2011 Document Reviewed: 07/06/2013 Exit[employer] Patient Information 2015 [place], [place]. This information is not intended to replace advice given to you by your health care provider. Make sure you discuss any questions you have with your health care provider. Exercise to Stay Healthy Exercise helps you become and stay healthy. EXERCISE IDEAS AND TIPS Choose exercises that:  You enjoy.  Fit into your day. You do not need to exercise really hard to be healthy. You can do exercises at a slow or medium level and stay healthy. You can:  Stretch before and after working out.  Try yoga, Pilates, or tai chi.  Lift weights.  Walk fast, swim, jog, run, climb stairs, bicycle, dance, or rollerskate.  Take aerobic classes. Exercises that burn about 150 calories:  Running 1  miles in 15 minutes.  Playing volleyball for 45 to 60 minutes.  Washing and waxing a car for 45 to 60 minutes.  Playing touch football for 45 minutes.  Walking 1  miles in 35 minutes.  Pushing a stroller 1  miles in 30 minutes.  Playing basketball for 30 minutes.  Raking leaves for 30 minutes.  Bicycling 5 miles in 30 minutes.  Walking 2 miles in 30 minutes.  Dancing for 30 minutes.  Shoveling snow for 15 minutes.  Swimming laps for 20  minutes.  Walking up stairs for 15 minutes.  Bicycling 4 miles in 15 minutes.  Gardening for 30 to 45 minutes.  Jumping rope for 15 minutes.  Washing windows or floors for 45 to 60 minutes. Document Released: 06/08/2010 Document Revised: 07/29/2011 Document Reviewed: 06/08/2010 [facility] Patient Information 2015 [place], [place]. This information is not intended to replace advice given to you by your health care provider. Make sure you discuss any questions you have with your health care provider.

## 2014-09-08 NOTE — Progress Notes (Signed)
Subjective:    Patient ID: Derrick Mata, male    DOB: July 15, 1950, 64 y.o.   MRN: [id number]  HPI Patient seen 64 year old African American, male for a complete physical. Past medical history obesity, hypertension, hip and ankle pain, depression and tobacco use.  Denies any chest pain, shortness of breath, orthropnea, or cough.  Denies any regular exercise or any changes in diet.  He has scheduled his annual eye exam and requests a pneumonia vaccine today.    The patient also would like an orthopedic referral for ankle, hip, and low back pain.  Presently taking prednisone prescribed from an urgent care visit approximately one week ago.  Patient presents for yearly preventative medicine examination. Medicare questionnaire was completed  All immunizations and health maintenance protocols were reviewed with the patient and needed orders were placed.  Appropriate screening laboratory values were ordered for the patient including screening of hyperlipidemia, renal function and hepatic function. If indicated by BPH, a PSA was ordered.  Medication reconciliation,  past medical history, social history, problem list and allergies were reviewed in detail with the patient  Goals were established with regard to weight loss, exercise, and  diet in compliance with medications  End of life planning was discussed.  Review of Systems  Constitutional: Negative.   HENT: Negative.   Eyes: Negative.   Respiratory: Negative.   Cardiovascular: Negative.   Gastrointestinal: Negative.   Endocrine: Negative.   Genitourinary: Negative.   Musculoskeletal: Negative.   Skin: Negative.   Allergic/Immunologic: Negative.   Neurological: Negative.   Hematological: Negative.   Psychiatric/Behavioral: Negative.    . Past Medical History  Diagnosis Date  . Sleep apnea   . Anxiety   . Arthritis   . CHF (congestive heart failure)   . Substance abuse   . Depression   . GERD (gastroesophageal reflux  disease)   . Shortness of breath   . COPD (chronic obstructive pulmonary disease)     History   Social History  . Marital Status: Married    Spouse Name: N/A  . Number of Children: N/A  . Years of Education: N/A   Occupational History  . retired    Social History Main Topics  . Smoking status: Current Every Day Smoker -- 1.00 packs/day for 51 years    Types: Cigarettes    Start date: 05/21/1963  . Smokeless tobacco: Not on file  . Alcohol Use: Yes     Comment: DRINKS 1/2 PINT LIQUOR DAILY----QUIT 2012  . Drug Use: No  . Sexual Activity: Not on file     Comment: states no alcohol in 3 weeks   Other Topics Concern  . Not on file   Social History Narrative    Past Surgical History  Procedure Laterality Date  . Lumbar laminectomy  1994, 2010  . Tibia fracture surgery  2006    hardware  . Knee arthroscopy  2011    left  . Tonsillectomy  1963  . Colonoscopy  04/18/2011    Procedure: COLONOSCOPY;  Surgeon: Scarlette Shorts, MD;  Location: [facility];  Service: Endoscopy;  Laterality: N/A;    Family History  Problem Relation Age of Onset  . Colon cancer Sister   . Stomach cancer Neg Hx   . Rectal cancer Neg Hx   . Breast cancer Mother   . Heart disease Other     Allergies  Allergen Reactions  . Penicillins     REACTION: nausea  . Sulfamethoxazole  REACTION: urticaria (hives)    Current Outpatient Prescriptions on File Prior to Visit  Medication Sig Dispense Refill  . aspirin EC 81 MG tablet Take 81 mg by mouth daily.     Marland Kitchen buPROPion (WELLBUTRIN XL) 150 MG 24 hr tablet Take 1 tablet (150 mg total) by mouth daily. 90 tablet 1  . furosemide (LASIX) 40 MG tablet TAKE 1 TABLET BY MOUTH TWICE DAILY 180 tablet 0  . omeprazole (PRILOSEC) 20 MG capsule TAKE ONE CAPSULE BY MOUTH DAILY    . potassium chloride SA (K-DUR,KLOR-CON) 20 MEQ tablet Take 1 tablet (20 mEq total) by mouth daily. 90 tablet 1  . sildenafil (VIAGRA) 100 MG tablet Take 0.5-1 tablets (50-100 mg  total) by mouth daily as needed for erectile dysfunction. 3 tablet 0  . traZODone (DESYREL) 150 MG tablet TAKE 1 TABLET BY MOUTH AT BEDTIME 90 tablet 0  . Triamcinolone Acetonide (TRIAMCINOLONE 0.1 % CREAM : EUCERIN) CREA Apply 1 application topically 2 (two) times daily. 1 each 1  . traMADol (ULTRAM) 50 MG tablet Take 1 tablet (50 mg total) by mouth 2 (two) times daily as needed. (Patient not taking: Reported on 09/08/2014) 60 tablet 0   No current facility-administered medications on file prior to visit.    BP 138/82 mmHg  Temp(Src) 97.6 F (36.4 C)  Ht 5\' 11"  (1.803 m)  Wt 422 lb (191.418 kg)  BMI 58.88 kg/m2chart    Objective:   Physical Exam  Constitutional: He is oriented to person, place, and time. He appears well-developed and well-nourished.  HENT:  Head: Normocephalic and atraumatic.  Right Ear: External ear normal.  Left Ear: External ear normal.  Nose: Nose normal.  Mouth/Throat: Oropharynx is clear and moist.  Abdominal: Soft. Bowel sounds are normal.  Genitourinary: Rectum normal, prostate normal and penis normal. Guaiac negative stool. No penile tenderness.  Musculoskeletal: Normal range of motion.  Neurological: He is alert and oriented to person, place, and time. He has normal reflexes.  Skin: Skin is warm and dry.  Psychiatric: He has a normal mood and affect. His behavior is normal. Judgment and thought content normal.          Assessment & Plan:  Derrick Mata was seen today for annual exam.  Diagnoses and all orders for this visit:  Pain in joint, ankle and foot, right Orders: -     AMB referral to orthopedics  Need for prophylactic vaccination against Streptococcus pneumoniae (pneumococcus) Orders: -     Pneumococcal conjugate vaccine 13-valent  Routine general medical examination at a health care facility  Morbid obesity   Assessment: Labs were unremarkable. Prostate exam unremarkable. Patient is stable at present.  Plan:  Continue current  medication regimen as prescribed. Recommended regular exercises- 3 times weekly for 45 minutes per day. Recommended smoking cessation, heart healthy diet,  and weight loss to improve over all health.

## 2014-09-08 NOTE — Progress Notes (Signed)
Pre visit review using our clinic review tool, if applicable. No additional management support is needed unless otherwise documented below in the visit note. 

## 2014-10-11 ENCOUNTER — Other Ambulatory Visit: Payer: Self-pay | Admitting: Family

## 2014-10-18 ENCOUNTER — Other Ambulatory Visit: Payer: Self-pay | Admitting: Family

## 2014-10-19 ENCOUNTER — Telehealth: Payer: Self-pay | Admitting: Family

## 2014-10-19 NOTE — Telephone Encounter (Signed)
Pt called about rx, but already called in

## 2014-11-14 ENCOUNTER — Telehealth: Payer: Self-pay | Admitting: Family Medicine

## 2014-11-14 NOTE — Telephone Encounter (Signed)
Patient Name: DELIO SLATES DOB: [birth year] Initial Comment Caller states he has medication questions Nurse Assessment Nurse: Ronnald Ramp, RN, [place] Date/Time (Eastern Time): 11/14/2014 1:15:21 PM Confirm and document reason for call. If symptomatic, describe symptoms. ---Caller states he has heard that Omeprazole can cause dementia and he has a concern about continuing to take the medication. Has the patient traveled out of the country within the last 30 days? ---Not Applicable Does the patient require triage? ---No Please document clinical information provided and list any resource used. ---Told caller that I would send a note to the doctor for further recommendations. Guidelines Guideline Title Affirmed Question Affirmed Notes Final Disposition User Clinical Call Ronnald Ramp, RN, [employer]

## 2014-11-25 NOTE — Telephone Encounter (Signed)
Left detailed message on machine for patient to try Zantac.

## 2014-11-25 NOTE — Telephone Encounter (Signed)
May try Zantac OTC.

## 2014-12-17 ENCOUNTER — Other Ambulatory Visit: Payer: Self-pay | Admitting: Family

## 2015-04-20 ENCOUNTER — Telehealth: Payer: Self-pay | Admitting: Family

## 2015-04-20 NOTE — Telephone Encounter (Signed)
Pt needs refill on trazodone 150 mg #30 w/refills sent to [facility] [address]. Pt has an appt to est w/kim in jan 2017

## 2015-04-21 MED ORDER — TRAZODONE HCL 150 MG PO TABS: 150.0000 mg | ORAL_TABLET | Freq: Every day | ORAL | Status: DC

## 2015-04-21 NOTE — Telephone Encounter (Signed)
Ok to refill X 1 month

## 2015-04-21 NOTE — Telephone Encounter (Signed)
Rx done. 

## 2015-05-29 ENCOUNTER — Ambulatory Visit: Payer: Medicare Other | Admitting: Family Medicine

## 2015-06-12 ENCOUNTER — Encounter: Payer: Self-pay | Admitting: Family Medicine

## 2015-06-12 ENCOUNTER — Telehealth: Payer: Self-pay | Admitting: *Deleted

## 2015-06-12 ENCOUNTER — Ambulatory Visit (INDEPENDENT_AMBULATORY_CARE_PROVIDER_SITE_OTHER): Payer: Medicare Other | Admitting: Family Medicine

## 2015-06-12 VITALS — BP 136/84 | HR 106 | Temp 97.9°F | Ht 71.0 in | Wt >= 6400 oz

## 2015-06-12 DIAGNOSIS — F329 Major depressive disorder, single episode, unspecified: Secondary | ICD-10-CM | POA: Diagnosis not present

## 2015-06-12 DIAGNOSIS — I10 Essential (primary) hypertension: Secondary | ICD-10-CM

## 2015-06-12 DIAGNOSIS — B1920 Unspecified viral hepatitis C without hepatic coma: Secondary | ICD-10-CM

## 2015-06-12 DIAGNOSIS — Z7189 Other specified counseling: Secondary | ICD-10-CM

## 2015-06-12 DIAGNOSIS — G4733 Obstructive sleep apnea (adult) (pediatric): Secondary | ICD-10-CM

## 2015-06-12 DIAGNOSIS — F32A Depression, unspecified: Secondary | ICD-10-CM

## 2015-06-12 DIAGNOSIS — G4700 Insomnia, unspecified: Secondary | ICD-10-CM

## 2015-06-12 DIAGNOSIS — B182 Chronic viral hepatitis C: Secondary | ICD-10-CM

## 2015-06-12 DIAGNOSIS — F17200 Nicotine dependence, unspecified, uncomplicated: Secondary | ICD-10-CM

## 2015-06-12 DIAGNOSIS — Z720 Tobacco use: Secondary | ICD-10-CM | POA: Diagnosis not present

## 2015-06-12 DIAGNOSIS — Z7689 Persons encountering health services in other specified circumstances: Secondary | ICD-10-CM

## 2015-06-12 HISTORY — DX: Chronic viral hepatitis C: B18.2

## 2015-06-12 NOTE — Patient Instructions (Addendum)
BEFORE YOU LEAVE: -tetanus booster -schedule physical in 3 months - please come FASTING for 8 hours but drink plenty of water, will plan to do labs on the same day as your physical  -We placed a referral for you as discussed to the sleep specialist and for the lung cancer screening. It usually takes about 1-2 weeks to process and schedule this referral. If you have not heard from [place] regarding this appointment in 2 weeks please contact our office.  We recommend the following healthy lifestyle measures: - eat a healthy whole foods diet consisting of regular small meals composed of vegetables, fruits, beans, nuts, seeds, healthy meats such as white chicken and fish and whole grains.  - avoid sweets, white starchy foods, fried foods, fast food, processed foods, sodas, red meet and other fattening foods.  - get a least 150-300 minutes of aerobic exercise per week in whatever form is tolerated  Please quit smoking.

## 2015-06-12 NOTE — Progress Notes (Signed)
Pre visit review using our clinic review tool, if applicable. No additional management support is needed unless otherwise documented below in the visit note. 

## 2015-06-12 NOTE — Telephone Encounter (Signed)
I informed the pt per Dr Maudie Mercury she reviewed his chart after he left and found he does have hepatitis C even though he told her he did not and was referred to a specialist and never saw them.  Dr Maudie Mercury recommended he see a specialist and have labs done and the pt agreed.  He is aware the referral was placed and a lab appt was scheduled for 1/25.

## 2015-06-12 NOTE — Progress Notes (Signed)
HPI:  Derrick Mata is here to establish care.  Last PCP and physical: 08/2014 with [facility]  Has the following chronic problems that require follow up and concerns today:  Morbid Obesity/CdCHF/Dependant Edema: -has lost > 100lbs - used to weigh > 500lbs -drinks soda and bread is his weakness -no regular exercise, reports bad OA of the knees -eval with cardiology in 2014 -meds:asa, lasix 40mg  bid, potassium chloride -has OA of knees  Tobacco Use/COPD: -1ppd for > 30 years -denies: SOB, DOE -interest in quitting not high currently -lung ca  Screening?  Depression, Anxiety, hx substance abuse and Insomnia: -meds: wellbutrin 150mg  daily and trazadone 150 mg nightly with prior PCP -reports depression and anxiety well controlled -reports las bout with drugs and alcohol was in 2012 - when lost father and brother and law -denies SI or panic, denies any depression in years -reports sleep is very poor, stays in recliner 24 hours so gets some sleep during the day -clean ans sober for 4 years  -used cocaine early in life, in [facility] x2 remotely  GERD:  -meds: priolosec  ED: -on viagra with prior PCP  OSA/COPD: -referred to pulmonology by prior PCP -he reports he will not wear his CPAP - too uncomfortable -he reports he doesn't think he has sleep apnea - admits to poor and restless sleep and dozing off during the day but he feels is do to sleep disorder, lifestlye -poor sleep  Hepatitis C: -per PCP notes referred to hepatologist -he told me today he does not have hep C and he was cleared -on ROC after visit I do not see record of tx but do see Positive Hep C labs remotely - hep c ab reactive and hcv quant 161000 -notes from 2012 from PCP at the time advise hep C clinic and note he is non-compliant, notes he was referred in 2006 as well, referral notes from 2012 closed due to expiration from review of notes -mild transaminitis since  ROS negative for unless reported  above: fevers, unintentional weight loss, hearing or vision loss, chest pain, palpitations, struggling to breath, hemoptysis, melena, hematochezia, hematuria, falls, loc, si, thoughts of self harm  Past Medical History  Diagnosis Date  . Sleep apnea     refuses CPAP  . Morbid obesity ([place])   . Arthritis     OA of knees  . CHF (congestive heart failure) ([place])   . 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 Depression     Anxiety and Insomnia  . GERD (gastroesophageal reflux disease)   . COPD (chronic obstructive pulmonary disease) ([facility])     > 30 pack yr smoker  . Leg edema   . Erectile dysfunction     Past Surgical History  Procedure Laterality Date  . Lumbar laminectomy  1994, 2010  . Tibia fracture surgery  2006    hardware  . Knee arthroscopy  2011    left  . Tonsillectomy  1963  . Colonoscopy  04/18/2011    Procedure: COLONOSCOPY;  Surgeon: Scarlette Shorts, MD;  Location: [facility];  Service: Endoscopy;  Laterality: N/A;    Family History  Problem Relation Age of Onset  . Colon cancer Sister   . Cancer Sister 9    sister, colon   . Stomach cancer Neg Hx   . Rectal cancer Neg Hx   . Breast cancer Mother   .  Cancer Mother     breast  . Heart disease Other   . Heart disease Father 72    Social History   Social History  . Marital Status: Married    Spouse Name: N/A  . Number of Children: N/A  . Years of Education: N/A   Occupational History  . retired    Social History Main Topics  . Smoking status: Current Every Day Smoker -- 1.00 packs/day for 51 years    Types: Cigarettes    Start date: 05/21/1963  . Smokeless tobacco: None  . Alcohol Use: No     Comment: DRINKS 1/2 PINT LIQUOR DAILY----QUIT 2012  . Drug Use: No  . Sexual Activity: Not Asked     Comment: states no alcohol in 3 weeks   Other Topics Concern  . 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  aspirin EC 81 MG tablet, Take 81 mg by mouth daily. , Disp: , Rfl:  .  buPROPion (WELLBUTRIN XL) 150 MG 24 hr tablet, Take 1 tablet (150 mg total) by mouth daily., Disp: 90 tablet, Rfl: 1 .  furosemide (LASIX) 40 MG tablet, TAKE 1 TABLET BY MOUTH TWICE DAILY, Disp: 180 tablet, Rfl: 1 .  potassium chloride SA (K-DUR,KLOR-CON) 20 MEQ tablet, Take 1 tablet (20 mEq total) by mouth daily., Disp: 90 tablet, Rfl: 1 .  ranitidine (ZANTAC) 150 MG capsule, Take 150 mg by mouth daily., Disp: , Rfl:  .  sildenafil (VIAGRA) 100 MG tablet, Take 0.5-1 tablets (50-100 mg total) by mouth daily as needed for erectile dysfunction., Disp: 3 tablet, Rfl: 0 .  traZODone (DESYREL) 150 MG tablet, Take 1 tablet (150 mg total) by mouth at bedtime., Disp: 30 tablet, Rfl: 0 .  Triamcinolone Acetonide (TRIAMCINOLONE 0.1 % CREAM : EUCERIN) CREA, Apply 1 application topically 2 (two) times daily., Disp: 1 each, Rfl: 1  EXAM:  Filed Vitals:   06/12/15 0932  BP: 136/84  Pulse: 106  Temp: 97.9 F (36.6 C)    Body mass index is 58.51 kg/(m^2).  GENERAL: vitals reviewed and listed above, alert, oriented, appears well hydrated and in no acute distress  HEENT: atraumatic, conjunttiva clear, no obvious abnormalities on inspection of external nose and ears  NECK: no obvious masses on inspection  LUNGS: clear to auscultation bilaterally, no wheezes, rales or rhonchi, good air movement  CV: HRRR, no peripheral edema  MS: moves all extremities without noticeable abnormality  PSYCH: pleasant and cooperative, no obvious depression or anxiety  ASSESSMENT AND PLAN:  > 40 minutes spent with this pt with > 50% face to face in  consultation. Discussed the following assessment and plan:  Smoker TOBACCO USE - Plan: Ambulatory Referral for Lung Cancer Screen -advised to quit, discussed risks, offered help, referred for lung ca screening  Depression -resolved, on trazadone and wellbutrin from prior PCP  OBESITY -lifestyle recs - advised importance of diet, activity and weight loss -discussed water aerobics may be easiest on his knees  Essential hypertension -stable  INSOMNIA - Plan: Ambulatory referral to Neurology OSA (obstructive sleep apnea) -  Plan: Ambulatory referral to Neurology -discussed risks/implications of untreated sleep apnea -he agreed to see sleep specialist but did not want to see pulm as is not interested in CPAP - thinks sleep issues not related to OSA -discussed option, advised referral to sleep specialist  Hep C: -he denied at visit, but on review of old records after visit it appears dx with hep c years ago and referred to hepatology in the past -advised assistant to notify pt, update labs and place referral to hepatology clinic  Encounter to establish care -We reviewed the PMH, [place], FH, SH, Meds and Allergies. -We provided refills for any medications we will prescribe as needed. -We addressed current concerns per orders and patient instructions. -We have asked for records for pertinent exams, studies, vaccines and notes from previous providers. -We have advised patient to follow up per instructions below.   -Patient advised to return or notify a doctor immediately if symptoms worsen or persist or new concerns arise.  Patient Instructions  BEFORE YOU LEAVE: -tetanus booster -schedule physical in 3 months - please come FASTING for 8 hours but drink plenty of water, will plan to do labs on the same day as your physical  -We placed a referral for you as discussed to the sleep specialist and for the lung cancer screening. It usually takes about 1-2 weeks to process and schedule this  referral. If you have not heard from [place] regarding this appointment in 2 weeks please contact our office.  We recommend the following healthy lifestyle measures: - eat a healthy whole foods diet consisting of regular small meals composed of vegetables, fruits, beans, nuts, seeds, healthy meats such as white chicken and fish and whole grains.  - avoid sweets, white starchy foods, fried foods, fast food, processed foods, sodas, red meet and other fattening foods.  - get a least 150-300 minutes of aerobic exercise per week in whatever form is tolerated  Please quit smoking.     Colin Benton R.

## 2015-06-13 ENCOUNTER — Other Ambulatory Visit: Payer: Self-pay | Admitting: Acute Care

## 2015-06-13 DIAGNOSIS — F17210 Nicotine dependence, cigarettes, uncomplicated: Secondary | ICD-10-CM

## 2015-06-14 ENCOUNTER — Other Ambulatory Visit (INDEPENDENT_AMBULATORY_CARE_PROVIDER_SITE_OTHER): Payer: Medicare Other

## 2015-06-14 ENCOUNTER — Other Ambulatory Visit: Payer: Medicare Other

## 2015-06-14 DIAGNOSIS — B1920 Unspecified viral hepatitis C without hepatic coma: Secondary | ICD-10-CM | POA: Diagnosis not present

## 2015-06-14 DIAGNOSIS — R300 Dysuria: Secondary | ICD-10-CM

## 2015-06-14 LAB — COMPREHENSIVE METABOLIC PANEL
ALBUMIN: 3.5 g/dL (ref 3.5–5.2)
ALK PHOS: 59 U/L (ref 39–117)
ALT: 29 U/L (ref 0–53)
AST: 39 U/L — AB (ref 0–37)
BILIRUBIN TOTAL: 0.8 mg/dL (ref 0.2–1.2)
BUN: 9 mg/dL (ref 6–23)
CO2: 27 mEq/L (ref 19–32)
CREATININE: 0.68 mg/dL (ref 0.40–1.50)
Calcium: 8.9 mg/dL (ref 8.4–10.5)
Chloride: 106 mEq/L (ref 96–112)
GFR: 149.84 mL/min (ref >60.00)
GLUCOSE: 96 mg/dL (ref 70–99)
Potassium: 4.1 mEq/L (ref 3.5–5.1)
SODIUM: 141 meq/L (ref 135–145)
TOTAL PROTEIN: 7 g/dL (ref 6.0–8.3)

## 2015-06-15 LAB — HEPATITIS C ANTIBODY: HCV Ab: REACTIVE — AB

## 2015-06-16 LAB — URINE CULTURE: Colony Count: 25000

## 2015-06-16 LAB — HEPATITIS C RNA QUANTITATIVE
HCV QUANT LOG: 5.5 {Log} — AB (ref <1.18)
HCV Quantitative: 318892 IU/mL — ABNORMAL HIGH (ref <15)

## 2015-06-19 ENCOUNTER — Encounter: Payer: Self-pay | Admitting: Family Medicine

## 2015-06-19 ENCOUNTER — Ambulatory Visit (INDEPENDENT_AMBULATORY_CARE_PROVIDER_SITE_OTHER)
Admission: RE | Admit: 2015-06-19 | Discharge: 2015-06-19 | Disposition: A | Discharge status: Home / Self Care | Payer: Medicare Other | Source: Ambulatory Visit | Attending: Acute Care | Admitting: Acute Care

## 2015-06-19 ENCOUNTER — Other Ambulatory Visit: Payer: Medicare Other

## 2015-06-19 ENCOUNTER — Ambulatory Visit (INDEPENDENT_AMBULATORY_CARE_PROVIDER_SITE_OTHER): Payer: Medicare Other | Admitting: Acute Care

## 2015-06-19 DIAGNOSIS — B182 Chronic viral hepatitis C: Secondary | ICD-10-CM

## 2015-06-19 DIAGNOSIS — F17210 Nicotine dependence, cigarettes, uncomplicated: Secondary | ICD-10-CM | POA: Diagnosis not present

## 2015-06-19 LAB — CBC WITH DIFFERENTIAL/PLATELET
BASOS ABS: 0 10*3/uL (ref 0.0–0.1)
BASOS PCT: 0 % (ref 0–1)
EOS ABS: 0.1 10*3/uL (ref 0.0–0.7)
Eosinophils Relative: 1 % (ref 0–5)
HCT: 44.7 % (ref 39.0–52.0)
HEMOGLOBIN: 14.7 g/dL (ref 13.0–17.0)
Lymphocytes Relative: 34 % (ref 12–46)
Lymphs Abs: 3.2 10*3/uL (ref 0.7–4.0)
MCH: 27.8 pg (ref 26.0–34.0)
MCHC: 32.9 g/dL (ref 30.0–36.0)
MCV: 84.7 fL (ref 78.0–100.0)
MONOS PCT: 6 % (ref 3–12)
MPV: 10.1 fL (ref 8.6–12.4)
Monocytes Absolute: 0.6 10*3/uL (ref 0.1–1.0)
NEUTROS ABS: 5.6 10*3/uL (ref 1.7–7.7)
NEUTROS PCT: 59 % (ref 43–77)
PLATELETS: 271 10*3/uL (ref 150–400)
RBC: 5.28 MIL/uL (ref 4.22–5.81)
RDW: 14.3 % (ref 11.5–15.5)
WBC: 9.5 10*3/uL (ref 4.0–10.5)

## 2015-06-19 LAB — HEPATITIS B SURFACE ANTIBODY,QUALITATIVE: Hep B S Ab: NEGATIVE

## 2015-06-19 LAB — HEPATITIS A ANTIBODY, TOTAL: Hep A Total Ab: NONREACTIVE

## 2015-06-19 LAB — HEPATITIS B SURFACE ANTIGEN: Hepatitis B Surface Ag: NEGATIVE

## 2015-06-19 LAB — IRON: IRON: 68 ug/dL (ref 50–180)

## 2015-06-19 LAB — HIV ANTIBODY (ROUTINE TESTING W REFLEX): HIV: NONREACTIVE

## 2015-06-19 LAB — HEPATITIS B CORE ANTIBODY, TOTAL: HEP B C TOTAL AB: NONREACTIVE

## 2015-06-19 NOTE — Progress Notes (Signed)
Shared Decision Making Visit Lung Cancer Screening Program [phone])   Eligibility:  Age 65 y.o.  Pack Years Smoking History Calculation 51 pack years (# packs/per year x # years smoked)  Recent History of coughing up blood  no  Unexplained weight loss? no ( >Than 15 pounds within the last 6 months )  Prior History Lung / other cancer no (Diagnosis within the last 5 years already requiring surveillance chest CT Scans).  Smoking Status Current Smoker  Former Smokers: Years since quit: Current smoker  Quit Date: Current smoker  Visit Components:  Discussion included one or more decision making aids. yes  Discussion included risk/benefits of screening. yes  Discussion included potential follow up diagnostic testing for abnormal scans. yes  Discussion included meaning and risk of over diagnosis. yes  Discussion included meaning and risk of False Positives. yes  Discussion included meaning of total radiation exposure. yes  Counseling Included:  Importance of adherence to annual lung cancer LDCT screening. yes  Impact of comorbidities on ability to participate in the program. yes  Ability and willingness to under diagnostic treatment. yes  Smoking Cessation Counseling:  Current Smokers:   Discussed importance of smoking cessation. yes  Information about tobacco cessation classes and interventions provided to patient. yes  Patient provided with "ticket" for LDCT Scan. yes  Symptomatic Patient. no  CounselingNA  Diagnosis Code: Tobacco Use Z72.0  Asymptomatic Patient yes  Counseling (Intermediate counseling: > three minutes counseling) [id number]  Former Smokers:   Discussed the importance of maintaining cigarette abstinence. yes  Diagnosis Code: Personal History of Nicotine Dependence. [id number]  Information about tobacco cessation classes and interventions provided to patient. Yes  Patient provided with "ticket" for LDCT Scan.yes  Written Order for Lung  Cancer Screening with LDCT placed in Epic. Yes (CT Chest Lung Cancer Screening Low Dose W/O CM) [id number] Z12.2-Screening of respiratory organs Z87.891-Personal history of nicotine dependence  I have spent 15 minutes of face to face time with [name] discussing the risks and benefits of lung cancer screening. We viewed a power point together that explained in detail the above noted topics. We paused at intervals to allow for questions to be asked and answered to ensure understanding.We discussed that the single most powerful action that he can take to decrease his risk of developing lung cancer is to quit smoking. We discussed whether or not he is ready to commit to setting a quit date. He is not ready to set a quit date. We discussed options for tools to aid in quitting smoking including nicotine replacement therapy, non-nicotine medications, support groups, Quit Smart classes, and behavior modification. We discussed that often times setting smaller, more achievable goals, such as eliminating 1 cigarette a day for a week and then 2 cigarettes a day for a week can be helpful in slowly decreasing the number of cigarettes smoked. This allows for a sense of accomplishment as well as providing a clinical benefit. I gave him the " Be Stronger Than Your Excuses" card with contact information for community resources, classes, free nicotine replacement therapy, and access to mobile apps, text messaging, and on-line smoking cessation help. I have also given him my card and contact information in the event he needs to contact me. We discussed the time and location of the scan, and that either June Leap, CMA, or I will call with the results within 24-48 hours of receiving them. I have provided him with a copy of the power point we viewed  as a  resource in the event they need reinforcement of the concepts we discussed today in the office. The patient verbalized understanding of all of  the above and had no further  questions upon leaving the office. They have my contact information in the event they have any further questions.    Derrick Spatz, NP

## 2015-06-20 LAB — PROTIME-INR
INR: 1.14 (ref <1.50)
PROTHROMBIN TIME: 14.7 s (ref 11.6–15.2)

## 2015-06-20 LAB — ANA: ANA: NEGATIVE

## 2015-06-23 LAB — HCV RNA, QUANT REAL-TIME PCR W/REFLEX
HCV RNA, PCR, QN (LOG): 5.31 {Log_IU}/mL — AB
HCV RNA, PCR, QN: 205000 IU/mL — ABNORMAL HIGH

## 2015-06-23 LAB — HCV RNA,LIPA RFLX NS5A DRUG RESIST

## 2015-06-25 LAB — HCV RNA NS5A DRUG RESISTANCE

## 2015-06-26 ENCOUNTER — Telehealth: Payer: Self-pay | Admitting: Acute Care

## 2015-06-26 NOTE — Telephone Encounter (Signed)
The results of this scan have been called to [name]. I explained to him that a lung RADS 2 scan indicates nodules with a very low likelihood of becoming a clinically active cancer due to size or lack of growth. I explained that they're benign in appearance and behavior. I explained that we will call him in 12 months for his annual scan. He verbalized understanding of the above and had no further questions upon ending the call. I will forward these results to Dr. Colin Benton at [facility], the patient's primary care provider.  Documentation of phone call to patient.

## 2015-07-06 ENCOUNTER — Institutional Professional Consult (permissible substitution): Payer: Self-pay | Admitting: Neurology

## 2015-07-06 ENCOUNTER — Telehealth: Payer: Self-pay

## 2015-07-06 NOTE — Telephone Encounter (Signed)
I spoke to pt and advised him that his appt today with Dr. Brett Fairy will need to be cancelled because she is out sick. Pt agreeable to coming in on 2/28 at 4:00. Pt verbalized understanding.

## 2015-07-18 ENCOUNTER — Encounter: Payer: Self-pay | Admitting: Neurology

## 2015-07-18 ENCOUNTER — Ambulatory Visit (INDEPENDENT_AMBULATORY_CARE_PROVIDER_SITE_OTHER): Payer: Medicare Other | Admitting: Neurology

## 2015-07-18 VITALS — BP 148/90 | HR 88 | Resp 20 | Ht 71.0 in | Wt >= 6400 oz

## 2015-07-18 DIAGNOSIS — J449 Chronic obstructive pulmonary disease, unspecified: Secondary | ICD-10-CM | POA: Diagnosis not present

## 2015-07-18 DIAGNOSIS — E662 Morbid (severe) obesity with alveolar hypoventilation: Secondary | ICD-10-CM | POA: Insufficient documentation

## 2015-07-18 DIAGNOSIS — Z87898 Personal history of other specified conditions: Secondary | ICD-10-CM

## 2015-07-18 DIAGNOSIS — J441 Chronic obstructive pulmonary disease with (acute) exacerbation: Secondary | ICD-10-CM | POA: Insufficient documentation

## 2015-07-18 DIAGNOSIS — I5040 Unspecified combined systolic (congestive) and diastolic (congestive) heart failure: Secondary | ICD-10-CM | POA: Diagnosis not present

## 2015-07-18 DIAGNOSIS — G4733 Obstructive sleep apnea (adult) (pediatric): Secondary | ICD-10-CM | POA: Diagnosis not present

## 2015-07-18 DIAGNOSIS — I509 Heart failure, unspecified: Secondary | ICD-10-CM | POA: Insufficient documentation

## 2015-07-18 DIAGNOSIS — E669 Obesity, unspecified: Secondary | ICD-10-CM | POA: Diagnosis not present

## 2015-07-18 DIAGNOSIS — Z9114 Patient's other noncompliance with medication regimen: Secondary | ICD-10-CM | POA: Diagnosis not present

## 2015-07-18 DIAGNOSIS — F1911 Other psychoactive substance abuse, in remission: Secondary | ICD-10-CM | POA: Insufficient documentation

## 2015-07-18 NOTE — Patient Instructions (Signed)

## 2015-07-18 NOTE — Progress Notes (Signed)
SLEEP MEDICINE CLINIC   Provider:  Larey Seat, M D  Referring Provider: Lucretia Kern, DO Primary Care Physician:  Lucretia Kern., DO  Chief Complaint  Patient presents with  . New Patient (Initial Visit)    had a sleep study in 2014, DID NOT BRING CPAP [place] REMINDED, rm 11, alone    HPI:  Heston Castella is a 65 y.o. male , seen here as a referral  from Dr. Maudie Mercury for a sleep consultation. Chief complaint according to patient : "I am up to 553 pounds, SOB and poor sleep "  Mr. Ahmir Delangel is here today to introduce himself to the sleep clinic. He was referred by his primary care physician was a request to reevaluate him for sleep apnea. He was originally evaluated for sleep apnea in 1998 upon referral of Dr. Bernerd Limbo, and was diagnosed on 3-20 7-98 with a RDI of 78 and oxygen nadir of 73% sleep latency of 13 minutes and a REM latency of 80 minutes. Dr. Baird Lyons interpreted the study and diagnosed severe obstructive sleep apnea syndrome oxygen desaturation, occasional PVC, moderate snoring. At that time Mr. Linward Foster weight was 340 pounds at a height of 6 foot and 2 inches. He was 65 years of age. He was titrated to 9 cm nasal CPAP with the gel mask and with heated humidity. His AHI or RDI at the time was 3 per hour. Under CPAP of 9 cm water pressure 2.2 per hour.  Also Mr. Feliciana Rossetti was reminded to bring his newest CPAP machine which is about 67 years old and has autotitration  Capacity, he forgot to bring the machine today. He had stated to his primary care physician Dr. Dorcas Mcmurray him that the CPAP was too uncomfortable he is restless he sleeps in a recliner for the last 65 years old and about he has actually been through different models of recliner with. He is not sure that he has sleep apnea and was referred to pulmonology by his prior PCP. He was also referred to a hepatologist for hepatitis C workup he was cleared. The patient has the comorbidities of arthritis especially  osteophytes of the knees, superobesity, congestive heart failure, history of substance abuse in the past he still smokes cigarettes. Depression, GERD, COPD after 30 years of smoking, leg edema he had a tonsillectomy in 1963. Paternal history of heart disease.  Dr. Maudie Mercury would like the patient to be evaluated for obstructive sleep apnea and insomnia she already explained the risks and implications of untreated sleep apnea. He agreed to see a sleep specialist but did not want to see a pulmonologist as he is not interested in CPAP.  #1He is here today to see if he still has apnea #1  #2 if his apnea can be treated with other means-  if still present. Also reports that he cannot tolerate sleeping on his side.   Sleep habits are as follows: The patient states he lives in his recliner and that's where he sleeps. His sleep periods are irregular. He goes to sleep around 11:30 PM will wake up at  3 AM this nocturia, will sleep again from 3.30 until 7 AM. His wife has not reported him to snore any longer. He cannot tolerate sleeping in the bed he states because his hips and lower back hurt his sides hurt. He tends to be short of breath when he moves , used different inhalers but not longer needed.  Addressed he  feels that he has a comfortable way breathing. He is not in pain when it has recliner. He wakes only up for 1 or 2 bathroom breaks,  He will get between 6 and 7 hours of nocturnal sleep. He will take trazodone to help him fall asleep. His sleep times are not set, he goes to sleep when tired. The recliner is in the living room.   Sleep medical history and family sleep history:  He has been morbidly obese for a long time, he doubts his old OSA diagnosis , he retired in 2000 for [employer] at 65 years old. He raises his grandchild, 48 years old.  Social history:  Unemployed, early retirement. Lives with wife and raises a grandchild.   Review of Systems: Out of a complete 14 system review, the patient  complains of only the following symptoms, and all other reviewed systems are negative.  Epworth score 2 , Fatigue severity score  , depression score 0   Social History   Social History  . Marital Status: Married    Spouse Name: N/A  . Number of Children: N/A  . Years of Education: N/A   Occupational History  . retired    Social History Main Topics  . Smoking status: Current Every Day Smoker -- 1.00 packs/day for 51 years    Types: Cigarettes    Start date: 05/21/1963  . Smokeless tobacco: Not on file  . Alcohol Use: No     Comment: DRINKS 1/2 PINT LIQUOR DAILY----QUIT 2012  . Drug Use: No  . Sexual Activity: Not on file     Comment: states no alcohol in 3 weeks   Other Topics Concern  . 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 Colon cancer Sister   . Cancer Sister 66    sister, colon   . Stomach cancer Neg Hx   . Rectal cancer Neg Hx   . Breast cancer Mother   . Cancer Mother     breast  . Heart disease Other   . Heart disease Father 13    Past Medical History  Diagnosis Date  . Sleep apnea     refuses CPAP  . Morbid obesity ([place])   . Arthritis     OA of knees  . CHF (congestive heart failure) ([place])   . 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 Depression     Anxiety and Insomnia  . GERD (gastroesophageal reflux disease)   . COPD (chronic obstructive pulmonary disease) ([facility])     > 30 pack yr smoker  . Leg edema   . Erectile dysfunction   . Hep C w/o coma, chronic ([place]) 06/12/2015    Past Surgical History  Procedure Laterality Date  . Lumbar laminectomy   1994, 2010  . Tibia fracture surgery  2006    hardware  . Knee arthroscopy  2011    left  . Tonsillectomy  1963  . Colonoscopy  04/18/2011  Procedure: COLONOSCOPY;  Surgeon: Scarlette Shorts, MD;  Location: [facility];  Service: Endoscopy;  Laterality: N/A;    Current Outpatient Prescriptions  Medication Sig Dispense Refill  . aspirin EC 81 MG tablet Take 81 mg by mouth daily.     Marland Kitchen buPROPion (WELLBUTRIN XL) 150 MG 24 hr tablet Take 1 tablet (150 mg total) by mouth daily. 90 tablet 1  . furosemide (LASIX) 40 MG tablet TAKE 1 TABLET BY MOUTH TWICE DAILY 180 tablet 1  . potassium chloride SA (K-DUR,KLOR-CON) 20 MEQ tablet Take 1 tablet (20 mEq total) by mouth daily. 90 tablet 1  . sildenafil (VIAGRA) 100 MG tablet Take 0.5-1 tablets (50-100 mg total) by mouth daily as needed for erectile dysfunction. 3 tablet 0  . traZODone (DESYREL) 150 MG tablet Take 1 tablet (150 mg total) by mouth at bedtime. 30 tablet 0  . Triamcinolone Acetonide (TRIAMCINOLONE 0.1 % CREAM : EUCERIN) CREA Apply 1 application topically 2 (two) times daily. 1 each 1   No current facility-administered medications for this visit.    Allergies as of 07/18/2015 - Review Complete 07/18/2015  Allergen Reaction Noted  . Penicillins  11/24/2006  . Sulfamethoxazole  11/24/2006    Vitals: BP 148/90 mmHg  Pulse 88  Resp 20  Ht 5\' 11"  (1.803 m)  Wt 415 lb (188.243 kg)  BMI 57.91 kg/m2 Last Weight:  Wt Readings from Last 1 Encounters:  07/18/15 415 lb (188.243 kg)   [id number] mass index is 57.91 kg/(m^2).     Last Height:   Ht Readings from Last 1 Encounters:  07/18/15 5\' 11"  (1.803 m)    Physical exam:  General: The patient is awake, alert and appears not in acute distress. The patient is well groomed. Head: Normocephalic, atraumatic. Neck is supple. Mallampati 5, status post tonsillectomy.  neck circumference:18. Oral breathing while talking. ,. Retrognathia is seen.  Cardiovascular:  Regular rate and rhythm ,  without  murmurs or carotid bruit, and without distended neck veins. Respiratory: Lungs are clear to auscultation. Skin:  Without evidence of edema, or rash Trunk: BMI is morbidly obese.  Neurologic exam : The patient is awake and alert, oriented to place and time.   Memory subjective described as intact.   Attention span & concentration ability appears normal. The patient is  Speech is fluent,  without  dysarthria, dysphonia or aphasia.  Mood and affect are appropriate.  Cranial nerves: Pupils are equal and briskly reactive to light.  Extraocular movements  in vertical and horizontal planes intact and without nystagmus. Visual fields by finger perimetry are intact. Hearing to finger rub intact.   Facial sensation intact to fine touch.  Facial motor strength is symmetric and tongue and uvula move midline. Shoulder shrug was symmetrical.   Motor exam: Normal tone, muscle bulk and symmetric strength in all extremities.  Sensory:  Fine touch, pinprick and vibration were tested in all extremities. Proprioception tested in the upper extremities was normal.   The patient was advised of the nature of the diagnosed sleep disorder, the treatment options and risks for general a health and wellness arising from not treating the condition.  I spent more than 45 minutes of face to face time with the patient. Greater than 50% of time was spent in counseling and coordination of care. We have discussed the diagnosis and differential and I answered the patient's questions.   I doubt this patients motivation to change his sleep hygiene, routines, BMI and willingness to be treated for apnea, if  confirmed.   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 I will not address hip pain, flank pain and arthritis in a neurological sleep clinic.    Assessment:  After physical and neurologic examination, review of laboratory studies,  Personal review of imaging studies, reports of other  /same  Imaging studies ,  Results of polysomnography/ neurophysiology testing and pre-existing records as far as provided in visit., my assessment is   1) OSA 1998, no recent sleep study, last CPAP is 65 years old and unused. He refused to have it downloaded. He gained further weight- likely apnea increased.   2) morbid obesity ( in 2016 he was # 74), COPD and CHF history . Denies shortness of breath but  breathes audibly 23 times a minute at rest.   3) Long standing non compliance history. Patient announced he will not use CPAP , would consider a dental device. May need oxygen .  4) Sober for 7 years, history of alcoholism.     Plan:  Treatment plan and additional workup : Patient will sleep in a recliner and his BMI does not allow for an in lab sleep study-  Need HST with portable capnography, if possible. I would like for his to return to the sleep lab in a recliner if he needs titration to CPAP and / or Oxygen suspect hypercapnia.   Rv after HST in home recliner, if he needs titration we have to find a recliner for him for a in lab titration.   Asencion Partridge Dangela How MD  07/18/2015   CC: Lucretia Kern, Do [address] [place], [place] [address]

## 2015-07-18 NOTE — Addendum Note (Signed)
Addended by: Larey Seat on: 07/18/2015 04:37 PM   Modules accepted: Orders

## 2015-07-19 ENCOUNTER — Ambulatory Visit (INDEPENDENT_AMBULATORY_CARE_PROVIDER_SITE_OTHER): Payer: Medicare Other | Admitting: Internal Medicine

## 2015-07-19 ENCOUNTER — Encounter: Payer: Self-pay | Admitting: Internal Medicine

## 2015-07-19 VITALS — BP 161/81 | HR 101 | Temp 98.1°F | Ht 71.0 in | Wt >= 6400 oz

## 2015-07-19 DIAGNOSIS — B182 Chronic viral hepatitis C: Secondary | ICD-10-CM

## 2015-07-19 MED ORDER — HEPATITIS B VAC RECOMBINANT 5 MCG/0.5ML IJ SUSP
0.5000 mL | Freq: Once | INTRAMUSCULAR | Status: AC
Start: 2015-07-19 — End: 2015-07-19
  Administered 2015-07-19: 5 ug via INTRAMUSCULAR

## 2015-07-19 MED ORDER — LEDIPASVIR-SOFOSBUVIR 90-400 MG PO TABS: 1.0000 | ORAL_TABLET | Freq: Every day | ORAL | Status: DC

## 2015-07-19 NOTE — Progress Notes (Signed)
[facility] for Infectious Disease   CC: consideration for treatment for chronic hepatitis C  HPI:  +Derrick Mata is a 65 y.o. male who presents for initial evaluation and management of chronic hepatitis C.  Patient tested positive late last year. Hepatitis C-associated risk factors present are: IV drug abuse (details: many years ago, > 20 years). Patient denies history of blood transfusion, sexual contact with person with liver disease, tattoos. Patient has had other studies performed. Results: hepatitis C RNA by PCR, result: positive. Patient has not had prior treatment for Hepatitis C. Patient does not have a past history of liver disease. Patient does not have a family history of liver disease. Patient does not  have associated signs or symptoms related to liver disease.  Labs reviewed and confirm chronic hepatitis C with a positive viral load.   Records reviewed from PCP, morbid obesity, also reviewed that he has sleep apnea but refuses CPAP.  Recent CT chest negative for cancer screen.       Patient does not have documented immunity to Hepatitis A. Patient does not have documented immunity to Hepatitis B.    Review of Systems:   Constitutional: negative for fatigue and malaise Gastrointestinal: negative for diarrhea All other systems reviewed and are negative      Past Medical History  Diagnosis Date  . Sleep apnea     refuses CPAP  . Morbid obesity ([place])   . Arthritis     OA of knees  . CHF (congestive heart failure) ([place])   . 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 Depression     Anxiety and Insomnia  . GERD (gastroesophageal reflux disease)   . COPD (chronic obstructive pulmonary disease) ([facility])     > 30 pack yr smoker  . Leg edema   . Erectile dysfunction   . Hep C w/o coma, chronic ([place]) 06/12/2015    Prior to Admission medications   Medication Sig Start Date End Date Taking?  Authorizing Provider  aspirin EC 81 MG tablet Take 81 mg by mouth daily.    Yes Historical Provider, MD  buPROPion (WELLBUTRIN XL) 150 MG 24 hr tablet Take 1 tablet (150 mg total) by mouth daily. 08/25/14  Yes Kennyth Arnold, FNP  furosemide (LASIX) 40 MG tablet TAKE 1 TABLET BY MOUTH TWICE DAILY 10/19/14  Yes Kennyth Arnold, FNP  potassium chloride SA (K-DUR,KLOR-CON) 20 MEQ tablet Take 1 tablet (20 mEq total) by mouth daily. 08/25/14  Yes Kennyth Arnold, FNP  Triamcinolone Acetonide (TRIAMCINOLONE 0.1 % CREAM : EUCERIN) CREA Apply 1 application topically 2 (two) times daily. 05/27/12  Yes Kennyth Arnold, FNP  Ledipasvir-Sofosbuvir (HARVONI) 90-400 MG TABS Take 1 tablet by mouth daily. 07/19/15   Thayer Headings, MD    Allergies  Allergen Reactions  . Penicillins     REACTION: nausea  . Sulfamethoxazole     REACTION: urticaria (hives)    Social History  Substance Use Topics  . Smoking status: Current Every Day Smoker -- 1.00 packs/day for 51 years    Types: Cigarettes    Start date: 05/21/1963  . Smokeless tobacco: None     Comment: not ready to quit yet  . Alcohol Use: No     Comment: DRINKS 1/2 PINT LIQUOR DAILY----QUIT 2012    Family History  Problem Relation Age of Onset  . Colon cancer Sister   .  Cancer Sister 50    sister, colon   . Stomach cancer Neg Hx   . Rectal cancer Neg Hx   . Breast cancer Mother   . Cancer Mother     breast  . Heart disease Other   . Heart disease Father 22  no cirrhosis, no liver cancer   Objective:  Constitutional: in no apparent distress,  Filed Vitals:   07/19/15 1351  BP: 161/81  Pulse: 101  Temp: 98.1 F (36.7 C)   Eyes: anicteric Cardiovascular: Cor RRR and No murmurs Respiratory: CTA B; normal respiratory effort at rest Gastrointestinal: Bowel sounds are normal; morbidly obese Musculoskeletal: peripheral pulses normal, no pedal edema, no clubbing or cyanosis Skin: negative for - jaundice, spider hemangioma, telangiectasia, palmar  erythema, ecchymosis and atrophy; no porphyria cutanea tarda Lymphatic: no cervical lymphadenopathy   Laboratory Genotype: No results found for: HCVGENOTYPE HCV viral load:  Lab Results  Component Value Date   HCVQUANT [id number]* 06/14/2015   Lab Results  Component Value Date   WBC 9.5 06/19/2015   HGB 14.7 06/19/2015   HCT 44.7 06/19/2015   MCV 84.7 06/19/2015   PLT 271 06/19/2015    Lab Results  Component Value Date   CREATININE 0.68 06/14/2015   BUN 9 06/14/2015   NA 141 06/14/2015   K 4.1 06/14/2015   CL 106 06/14/2015   CO2 27 06/14/2015    Lab Results  Component Value Date   ALT 29 06/14/2015   AST 39* 06/14/2015   ALKPHOS 59 06/14/2015     Labs and history reviewed and show CHILD-PUGH A  5-6 points: Child class A 7-9 points: Child class B 10-15 points: Child class C  Lab Results  Component Value Date   INR 1.14 06/19/2015   BILITOT 0.8 06/14/2015   ALBUMIN 3.5 06/14/2015     Assessment: New Patient with Chronic Hepatitis C genotype 1a, untreated.  I discussed with the patient the lab findings that confirm chronic hepatitis C as well as the natural history and progression of disease including about 30% of people who develop cirrhosis of the liver if left untreated and once cirrhosis is established there is a 2-7% risk per year of liver cancer and liver failure.  I discussed the importance of treatment and benefits in reducing the risk, even if significant liver fibrosis exists.   Plan: 1) Patient counseled extensively on limiting acetaminophen to no more than 2 grams daily, avoidance of alcohol. 2) Transmission discussed with patient including sexual transmission, sharing razors and toothbrush.   3) Will need referral to gastroenterology if concern for cirrhosis 4) Will need referral for substance abuse counseling: No.; Further work up to include urine drug screen  No. 5) Will prescribe Harvoni for 12 weeks 6) Hepatitis A vaccine Yes.   7) Hepatitis B  vaccine Yes.   8) Pneumovax vaccine if concern for cirrhosis 9) Further work up to include liver staging with elastography 10) will follow up after starting medication

## 2015-07-19 NOTE — Addendum Note (Signed)
Addended by: Myrtis Hopping A on: 07/19/2015 05:10 PM   Modules accepted: Orders

## 2015-07-19 NOTE — Patient Instructions (Addendum)
Date 07/19/2015  Dear Mr Derrick Mata, As discussed in the [facility], your hepatitis C therapy will include the following medications:          Harvoni 90mg /400mg  tablet:           Take 1 tablet by mouth once daily   Please note that ALL MEDICATIONS WILL START ON THE SAME DATE for a total of 12 weeks. ---------------------------------------------------------------- Your HCV Treatment Start Date: TBA   Your HCV genotype:  1a    Liver Fibrosis: TBD    ---------------------------------------------------------------- YOUR PHARMACY CONTACT:   [facility] [place] Phone: [phone] Hours: Monday to Friday 7:30 am to 6:00 pm   Please always contact your pharmacy at least 3-4 business days before you run out of medications to ensure your next month's medication is ready or 1 week prior to running out if you receive it by mail.  Remember, each prescription is for 28 days. ---------------------------------------------------------------- GENERAL NOTES REGARDING YOUR HEPATITIS C MEDICATION:  SOFOSBUVIR/LEDIPASVIR (HARVONI): - Harvoni tablet is taken daily with OR without food. - The tablets are orange. - The tablets should be stored at room temperature.  - Acid reducing agents such as H2 blockers (ie. Pepcid (famotidine), Zantac (ranitidine), Tagamet (cimetidine), Axid (nizatidine) and proton pump inhibitors (ie. Prilosec (omeprazole), Protonix (pantoprazole), Nexium (esomeprazole), or Aciphex (rabeprazole)) can decrease effectiveness of Harvoni. Do not take until you have discussed with a health care provider.    -Antacids that contain magnesium and/or aluminum hydroxide (ie. Milk of Magensia, Rolaids, Gaviscon, Maalox, Mylanta, an dArthritis Pain Formula)can reduce absorption of Harvoni, so take them at least 4 hours before or after Harvoni.  -Calcium carbonate (calcium supplements or antacids such as Tums, Caltrate,  Os-Cal)needs to be taken at least 4 hours hours before or after Harvoni.  -St. John's wort or any products that contain St. John's wort like some herbal supplements  Please inform the office prior to starting any of these medications.  - The common side effects associated with Harvoni include:      1. Fatigue      2. Headache      3. Nausea      4. Diarrhea      5. Insomnia  Please note that this only lists the most common side effects and is NOT a comprehensive list of the potential side effects of these medications. For more information, please review the drug information sheets that come with your medication package from the pharmacy.  ---------------------------------------------------------------- GENERAL HELPFUL HINTS ON HCV THERAPY: 1. Stay well-hydrated. 2. Notify the [facility] of any changes in your other over-the-counter/herbal or prescription medications. 3. If you miss a dose of your medication, take the missed dose as soon as you remember. Return to your regular time/dose schedule the next day.  4.  Do not stop taking your medications without first talking with your healthcare provider. 5.  You may take Tylenol (acetaminophen), as long as the dose is less than 2000 mg (OR no more than 4 tablets of the Tylenol Extra Strengths 500mg  tablet) in 24 hours. 6.  You will see our pharmacist-specialist within the first 2 weeks of starting your medication. 7.  You will need to obtain routine labs around week 4 and12 weeks after starting and then 3 to 6 months after finishing Harvoni.    Scharlene Gloss, [place] for [place],  [place]  [id number] [phone]

## 2015-07-26 ENCOUNTER — Telehealth: Payer: Self-pay | Admitting: Family Medicine

## 2015-07-26 ENCOUNTER — Encounter: Payer: Medicare Other | Admitting: Internal Medicine

## 2015-07-26 MED ORDER — TRAZODONE HCL 150 MG PO TABS: 150.0000 mg | ORAL_TABLET | Freq: Every day | ORAL | Status: DC

## 2015-07-26 NOTE — Telephone Encounter (Signed)
Pt request refill of the following: TRAZODONE   Phamacy: [facility]

## 2015-08-03 ENCOUNTER — Encounter (INDEPENDENT_AMBULATORY_CARE_PROVIDER_SITE_OTHER): Payer: Medicare Other | Admitting: Neurology

## 2015-08-03 DIAGNOSIS — J449 Chronic obstructive pulmonary disease, unspecified: Principal | ICD-10-CM

## 2015-08-03 DIAGNOSIS — E669 Obesity, unspecified: Secondary | ICD-10-CM

## 2015-08-03 DIAGNOSIS — I5040 Unspecified combined systolic (congestive) and diastolic (congestive) heart failure: Secondary | ICD-10-CM

## 2015-08-03 DIAGNOSIS — J441 Chronic obstructive pulmonary disease with (acute) exacerbation: Secondary | ICD-10-CM

## 2015-08-03 DIAGNOSIS — G4733 Obstructive sleep apnea (adult) (pediatric): Secondary | ICD-10-CM | POA: Diagnosis not present

## 2015-08-03 DIAGNOSIS — F1911 Other psychoactive substance abuse, in remission: Secondary | ICD-10-CM

## 2015-08-03 DIAGNOSIS — Z9114 Patient's other noncompliance with medication regimen: Secondary | ICD-10-CM

## 2015-08-09 ENCOUNTER — Telehealth: Payer: Self-pay

## 2015-08-09 DIAGNOSIS — G4733 Obstructive sleep apnea (adult) (pediatric): Secondary | ICD-10-CM

## 2015-08-09 NOTE — Telephone Encounter (Signed)
I spoke to pt and advised him that his HST revealed osa and severe hypoxia with a prolonged time of low oxygen saturation. Dr. Brett Fairy recommends cpap treatment and a full night titration in lab is recommended to optimize therapy and titrate oxygen should cpap alone not be sufficient to alleviate hypoxia. I advised pt to avoid sedative hypnotics which may worsen sleep apnea, alcohol, and tobacco. I advised pt to lose weight, diet, and exercise if not contraindicated, and to avoid driving or operating hazardous machinery if sleepy.  Pt verbalized understanding.   Pt is agreeable to coming in for the in-lab study only if he can do the study in a recliner. I advised pt that I would ask our sleep lab manager to call him and discuss. Pt verbalized understanding.

## 2015-08-10 NOTE — Telephone Encounter (Signed)
-----   Message from [place] sent at 08/10/2015 10:25 AM EDT ----- Spoke with patient.He cannot tolerate the bed for his cpap titration.  He is too large for our recliners. Spoke with janet and we cannot move the ones from infusion in here. They are too heavy.  Explained to patient about auto therapy. He would like to do this. Can we get this ordered for him?

## 2015-08-10 NOTE — Telephone Encounter (Signed)
Auto 5-15 cm water

## 2015-08-10 NOTE — Telephone Encounter (Signed)
See prior message from Shirlean Mylar? Ok to order an auto pap, and if so what settings? I will send to DME once order is generated.

## 2015-08-14 ENCOUNTER — Encounter: Payer: Self-pay | Admitting: *Deleted

## 2015-08-21 ENCOUNTER — Ambulatory Visit (INDEPENDENT_AMBULATORY_CARE_PROVIDER_SITE_OTHER): Payer: Medicare Other | Admitting: *Deleted

## 2015-08-21 ENCOUNTER — Telehealth: Payer: Self-pay | Admitting: *Deleted

## 2015-08-21 DIAGNOSIS — Z23 Encounter for immunization: Secondary | ICD-10-CM

## 2015-08-21 NOTE — Telephone Encounter (Signed)
Patient notified of appt for ultrasound at [facility] for 09/12/15 at 7:45 AM. He is aware nothing to eat or drink after midnight. Myrtis Hopping

## 2015-08-23 ENCOUNTER — Telehealth: Payer: Self-pay | Admitting: Neurology

## 2015-08-23 NOTE — Telephone Encounter (Signed)
Derrick Mata said this patient would need an auto pap not CPAP due to his size and he doesn't sleep in a bed.  He sleeps in a recliner and we don't have one large enough.

## 2015-08-23 NOTE — Telephone Encounter (Signed)
Per [facility], pt cancelled his appt to get his cpap settings changed.   "We had an apt for the pressure change but the patient called to cancel the apt Per our RT team the patient could not locate his PAP machine.

## 2015-08-23 NOTE — Telephone Encounter (Signed)
I have already sent this order to [facility]. I have sent a message to [facility] asking them if they have set the pt up yet. I will call pt to schedule a follow up appt when I receive confirmation that pt has been set up.

## 2015-08-25 ENCOUNTER — Telehealth: Payer: Self-pay | Admitting: Family Medicine

## 2015-08-25 NOTE — Telephone Encounter (Signed)
Pt states he has occasional shoulder pain and can only take  traMADol (ULTRAM) 50 MG tablet    Pt would like to know if Dr Maudie Mercury will refill this for him? Pt's last refill was 08/22/2014 Pt has CPE on 4/28  [employer] [employer]

## 2015-08-25 NOTE — Telephone Encounter (Signed)
I have not prescribed this for him in the past. I do not typically recommend nor prescribed this type of pain medication for chronic musculoskeletal conditions. If he is having significant shoulder pain that he feels he needs treatment for, please help him to schedule an appointment to evaluate and suggest other treatments. Thank you.

## 2015-08-28 ENCOUNTER — Other Ambulatory Visit: Payer: Self-pay | Admitting: Family Medicine

## 2015-08-28 NOTE — Telephone Encounter (Signed)
I left a message for the pt to return my call. 

## 2015-08-28 NOTE — Telephone Encounter (Signed)
The pt returned my call and was informed of the message below.  I offered an appt for him to be seen this week and he stated he cannot come in since he is keeping his grandson and offered different dates for next week and he stated those dates will not be good and he will just deal with the pain until his appt for his physical on 4/28.

## 2015-08-29 ENCOUNTER — Telehealth: Payer: Self-pay | Admitting: Family Medicine

## 2015-08-29 NOTE — Telephone Encounter (Signed)
I called Derrick Mata and spoke with Derrick Mata and she stated the have the Rx on hold and [facility] can call them to have the Rx sent to them.  I called [facility] and spoke with Derrick Mata and informed her of this and she stated she will call Derrick Mata.  I left a detailed message at the pts cell number to let him know per Derrick Mata the automated system will inform him when the Rx is ready for pick up.

## 2015-08-29 NOTE — Telephone Encounter (Signed)
Pt states his rx traZODone (DESYREL) 150 MG tablet was sent to  [facility]. Pt has never gotten anything filled at [facility] and does not know why it would have been sent there.  Pt needs this rx resent to [facility] on [address]. 90 tabs w/ 1 refill.  [facility] and pt never was able to pick this up.  Pt would appreciate a call back

## 2015-09-12 ENCOUNTER — Ambulatory Visit (HOSPITAL_COMMUNITY)
Admission: RE | Admit: 2015-09-12 | Discharge: 2015-09-12 | Disposition: A | Discharge status: Home / Self Care | Payer: Medicare Other | Source: Ambulatory Visit | Attending: Internal Medicine | Admitting: Internal Medicine

## 2015-09-12 DIAGNOSIS — R932 Abnormal findings on diagnostic imaging of liver and biliary tract: Secondary | ICD-10-CM | POA: Diagnosis not present

## 2015-09-12 DIAGNOSIS — B182 Chronic viral hepatitis C: Secondary | ICD-10-CM | POA: Diagnosis present

## 2015-09-12 DIAGNOSIS — K8020 Calculus of gallbladder without cholecystitis without obstruction: Secondary | ICD-10-CM | POA: Diagnosis not present

## 2015-09-14 MED FILL — *HARVONI 90-400 MG TABLET: 90-400 | 28 days supply | Qty: 28 | Fill #0

## 2015-09-15 ENCOUNTER — Encounter: Payer: Self-pay | Admitting: Family Medicine

## 2015-09-15 ENCOUNTER — Encounter: Payer: Self-pay | Admitting: Pharmacy Technician

## 2015-09-15 ENCOUNTER — Ambulatory Visit (INDEPENDENT_AMBULATORY_CARE_PROVIDER_SITE_OTHER): Payer: Medicare Other | Admitting: Family Medicine

## 2015-09-15 VITALS — BP 132/80 | HR 99 | Temp 97.7°F | Ht 70.5 in | Wt >= 6400 oz

## 2015-09-15 DIAGNOSIS — E662 Morbid (severe) obesity with alveolar hypoventilation: Secondary | ICD-10-CM

## 2015-09-15 DIAGNOSIS — Z0000 Encounter for general adult medical examination without abnormal findings: Secondary | ICD-10-CM | POA: Diagnosis not present

## 2015-09-15 DIAGNOSIS — F17200 Nicotine dependence, unspecified, uncomplicated: Secondary | ICD-10-CM

## 2015-09-15 DIAGNOSIS — I10 Essential (primary) hypertension: Secondary | ICD-10-CM

## 2015-09-15 DIAGNOSIS — I5040 Unspecified combined systolic (congestive) and diastolic (congestive) heart failure: Secondary | ICD-10-CM

## 2015-09-15 DIAGNOSIS — G4733 Obstructive sleep apnea (adult) (pediatric): Secondary | ICD-10-CM

## 2015-09-15 DIAGNOSIS — J449 Chronic obstructive pulmonary disease, unspecified: Secondary | ICD-10-CM

## 2015-09-15 DIAGNOSIS — F17210 Nicotine dependence, cigarettes, uncomplicated: Secondary | ICD-10-CM | POA: Diagnosis not present

## 2015-09-15 DIAGNOSIS — B182 Chronic viral hepatitis C: Secondary | ICD-10-CM

## 2015-09-15 LAB — LIPID PANEL
CHOLESTEROL: 140 mg/dL (ref 0–200)
HDL: 48.1 mg/dL (ref >39.00)
LDL CALC: 74 mg/dL (ref 0–99)
NONHDL: 92.29
Total CHOL/HDL Ratio: 3
Triglycerides: 90 mg/dL (ref 0.0–149.0)
VLDL: 18 mg/dL (ref 0.0–40.0)

## 2015-09-15 LAB — HEMOGLOBIN A1C: HEMOGLOBIN A1C: 5.7 % (ref 4.6–6.5)

## 2015-09-15 NOTE — Patient Instructions (Signed)
BEFORE YOU LEAVE: -Advanced Directive Discussion with Sharyn Lull -check on his pneumococcal vaccine, he wishes to get if it was not done at [facility] -labs -schedule follow up in 4-6 months  We recommend the following healthy lifestyle measures: - eat a healthy whole foods diet consisting of regular small meals composed of vegetables, fruits, beans, nuts, seeds, healthy meats such as white chicken and fish and whole grains.  - avoid sweets, white starchy foods, fried foods, fast food, processed foods, sodas, red meet and other fattening foods.  - get a least 150-300 minutes of aerobic exercise per week.   I recommend that you quit smoking. Please let me know if we can help.  Follow up with your specialist regarding your liver ultrasound.  -We have ordered labs or studies at this visit. It can take up to 1-2 weeks for results and processing. We will contact you with instructions IF your results are abnormal. Normal results will be released to your [facility]. If you have not heard from [place] or can not find your results in [facility] in 2 weeks please contact our office.

## 2015-09-15 NOTE — Progress Notes (Signed)
Medicare Annual Preventive Care Visit  (initial annual wellness or annual wellness exam)  Derrick Mata is a pleasant morbidly obese 65 yo with a complicated PMH and a hisstory of poor compliance whom recently transition care to me, here for his annual wellness exam.  Concerns and/or follow up today:  Morbid Obesity/CdCHF/Dependant Edema: -reports has lost > 100lbs - used to weigh > 500lbs -drinks soda and bread is his weakness -no regular exercise, reports bad OA of the knees -eval with cardiology in 2014 -meds:asa, lasix 40mg  bid, potassium chloride -has OA of knees  Tobacco Use/COPD: -1ppd for > 30 years -denies: SOB, DOE -reports not interested in quitting, aware of risks -he did lung cancer screening with Eric Form 06/2015  Hx ofDepression, Anxiety, hx substance abuse and Insomnia/OSA: -meds: wellbutrin 150mg  daily and trazadone 150 mg nightly started by prior PCP -reports depression and anxiety well controlled -reported at initial visit, last bout with drugs and alcohol was in 2012 - when lost father and brother and law -denies SI or panic, denies any depression in years -reports sleep is very poor, stays in recliner 24 hours so gets some sleep during the day -clean ans sober for 4 years - referred him to sleep specialist, he refuses CPAP -used cocaine early in life, [facility]  GERD:  -resolved  ED: -on viagra with prior PCP  OSA/COPD: -referred to pulmonology by prior PCP -referred to sleep specialist, refused to wear CPAP  Hepatitis C: -per PCP notes referred to hepatologist remotely, he didn't go -he told me at his first visit he does not have hep C  - I rechecked labs at first visit and referred to specialist -notes from 2012 from PCP at the time advise hep C clinic and note he is non-compliant, notes he was referred in 2006 as well, referral notes from 2012 closed due to expiration from review of notes -mild transaminitis  since -appears he finally saw specialist for this, 07/19/2015- given hep a and b vaccines, plan for liver staging and started harvoni -he is distrustful of doctors and pending [place] results he reports he may request referral to hepatologist   ROS: negative for report of fevers, unintentional weight loss, vision changes, vision loss, hearing loss or change, chest pain, sob, hemoptysis, melena, hematochezia, hematuria, genital discharge or lesions, falls, bleeding or bruising, loc, thoughts of suicide or self harm, memory loss  1.) Patient-completed health risk assessment  - completed and reviewed, see scanned documentation  2.) Review of Medical History: -PMH, PSH, Family History and current specialty and care providers reviewed and updated and listed below  - see scanned in document in chart and below  Past Medical History  Diagnosis Date  . Sleep apnea     refuses CPAP  . Morbid obesity ([place])   . Arthritis     OA of knees  . CHF (congestive heart failure) ([place])   . 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 Depression     Anxiety and Insomnia  . GERD (gastroesophageal reflux disease)   . COPD (chronic obstructive pulmonary disease) (HCC)     > 30 pack yr smoker  . Leg edema   . Erectile dysfunction   . Hep C w/o coma, chronic ([place]) 06/12/2015    Past Surgical History  Procedure Laterality Date  . Lumbar laminectomy  1994, 2010  . Tibia fracture surgery  2006    hardware  . Knee arthroscopy  2011    left  . Tonsillectomy  1963  . Colonoscopy  04/18/2011    Procedure: COLONOSCOPY;  Surgeon: Scarlette Shorts, MD;  Location: [facility];  Service: Endoscopy;  Laterality: N/A;    Social History   Social History  . Marital Status: Married    Spouse Name: N/A  . Number of Children: N/A  . Years of Education: N/A   Occupational History  . retired    Social History Main Topics  . Smoking status: Current Every  Day Smoker -- 1.00 packs/day for 51 years    Types: Cigarettes    Start date: 05/21/1963  . Smokeless tobacco: Not on file     Comment: not ready to quit yet  . Alcohol Use: No     Comment: DRINKS 1/2 PINT LIQUOR DAILY----QUIT 2012  . Drug Use: No  . Sexual Activity:    Partners: Female     Comment: states no alcohol in 3 weeks   Other Topics Concern  . 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 Colon cancer Sister   . Cancer Sister 55    sister, colon   . Stomach cancer Neg Hx   . Rectal cancer Neg Hx   . Breast cancer Mother   . Cancer Mother     breast  . Heart disease Other   . Heart disease Father 77    Current Outpatient Prescriptions on File Prior to Visit  Medication Sig Dispense Refill  . aspirin EC 81 MG tablet Take 81 mg by mouth daily.     Marland Kitchen buPROPion (WELLBUTRIN XL) 150 MG 24 hr tablet Take 1 tablet (150 mg total) by mouth daily. 90 tablet 1  . furosemide (LASIX) 40 MG tablet TAKE 1 TABLET BY MOUTH TWICE DAILY 180 tablet 1  . Ledipasvir-Sofosbuvir (HARVONI) 90-400 MG TABS Take 1 tablet by mouth daily. 28 tablet 2  . potassium chloride SA (K-DUR,KLOR-CON) 20 MEQ tablet Take 1 tablet (20 mEq total) by mouth daily. 90 tablet 1  . traZODone (DESYREL) 150 MG tablet Take 1 tablet (150 mg total) by mouth at bedtime. 90 tablet 1  . Triamcinolone Acetonide (TRIAMCINOLONE 0.1 % CREAM : EUCERIN) CREA Apply 1 application topically 2 (two) times daily. 1 each 1   No current facility-administered medications on file prior to visit.     3.) Review of functional ability and level of safety:  Any difficulty hearing?  NO  History of falling?   NO  Any trouble with IADLs - using a phone, using transportation, grocery shopping, preparing meals, doing housework, doing laundry, taking medications and managing money? NO  Advance Directives? NO - interested in discussing  See summary of recommendations in Patient Instructions below.  4.) Physical Exam Filed Vitals:   09/15/15 0828  BP: 132/80  Pulse: 99  Temp: 97.7 F (36.5 C)   Estimated body mass index is 60.07 kg/(m^2) as calculated from the following:   Height as of this encounter: 5' 10.5" (1.791  m).   Weight as of this encounter: 424 lb 12.8 oz (192.688 kg).  EKG (optional): deferred  General: alert, appear well hydrated and in no acute distress, morbid obesity  HEENT: visual acuity grossly intact  CV: HRRR  Lungs: CTA bilaterally  Psych: pleasant and cooperative, no obvious depression or anxiety  Cognitive function grossly intact  See patient instructions for recommendations.  Education and counseling regarding the above review of health provided with a plan for the following: -see scanned patient completed form for further details -fall prevention strategies discussed  -healthy lifestyle discussed -importance and resources for completing advanced directives discussed -see patient instructions below for any other recommendations provided  4)The following written screening schedule of preventive measures were reviewed with assessment and plan made per below, orders and patient instructions:      AAA screening done if applicable - done [id number]     Alcohol screening done     Obesity Screening and counseling done     STI screening (Hep C if born 1947-65) offered and per pt wishes, HIV and hep C done     Tobacco Screening done done      Pneumococcal (PPSV23 -one dose after 33, one before if risk factors), influenza yearly and hepatitis B vaccines (if high risk - end stage renal disease, IV drugs, homosexual men, live in home for mentally retarded,  hemophilia receiving factors) ASSESSMENT/PLAN: done if applicable      Prostate cancer screening ASSESSMENT/PLAN: after discussion risks and benefits he declined      Colorectal cancer screening (FOBT yearly or flex sig q4y or colonoscopy q10y or barium enema q4y) ASSESSMENT/PLAN: utd per chart      Diabetes outpatient self-management training services ASSESSMENT/PLAN: see orders      Screening for glaucoma(q1y if high risk - diabetes, FH, AA and > 50 or hispanic and > 65) ASSESSMENT/PLAN: sees Dr. Herbert Deaner      Medical nutritional therapy for individuals with diabetes or renal disease ASSESSMENT/PLAN: see orders      Cardiovascular screening blood tests (lipids q5y) ASSESSMENT/PLAN: see orders and labs      Diabetes screening tests ASSESSMENT/PLAN: see orders and labs   7.) Summary:   Medicare annual wellness visit, initial - Plan: Lipid Panel, Hemoglobin A1c -risk factors and conditions per above assessment were discussed and treatment, recommendations and referrals were offered per documentation above and orders and patient instructions. -Advanced directive discussion with Sharyn Lull, RN  Essential hypertension -stable  Morbid obesity, unspecified obesity type ([place]) -lifestyle recs  Moderate smoker (20 or less per day) -advised to quit, advised of risks, offered help - he declined, not interested in quitting  Obesity hypoventilation syndrome (HCC) CHF (congestive heart failure), NYHA class I, unspecified failure chronicity, combined ([place]) -stable  Hep C w/o coma, chronic ([place]) -seeing ID  OSA and COPD overlap syndrome (HCC) TOBACCO USE -stable, advised to quit, he refused tx for OSA, sleeps sitting up  Patient Instructions  BEFORE YOU LEAVE: -Advanced Directive Discussion with Sharyn Lull -check on his pneumococcal vaccine, he wishes to get if it [facility] not [facility] -labs -schedule follow up in 4-6 months  We recommend the following healthy lifestyle  measures: - eat a healthy whole foods diet consisting of regular small meals composed of vegetables, fruits, beans, nuts, seeds, healthy meats such as white chicken and fish and whole grains.  - avoid sweets, white starchy foods, fried foods, fast food, processed foods, sodas, red meet and other fattening foods.  - get a  least 150-300 minutes of aerobic exercise per week.   I recommend that you quit smoking. Please let me know if we can help.  Follow up with your specialist regarding your liver ultrasound.  -We have ordered labs or studies at this visit. It can take up to 1-2 weeks for results and processing. We will contact you with instructions IF your results are abnormal. Normal results will be released to your [facility]. If you have not heard from [place] or can not find your results in [facility] in 2 weeks please contact our office.

## 2015-09-15 NOTE — Progress Notes (Signed)
Spent 28 minutes discussing the following issues regarding advance directives:  -Goals of care -Code status -Difference between hospice and palliative care -Living Will -Reason and significance for power of attorney -Difference between MOST form/DNR -What entails a full code status Only patient and self were present in exam room. Patient was alert and oriented x3 with no impairment. No paperwork was completed at this time. Patient states plan to discuss advance directive with wife and call office with questions or concerns.

## 2015-09-15 NOTE — Progress Notes (Signed)
Pre visit review using our clinic review tool, if applicable. No additional management support is needed unless otherwise documented below in the visit note. 

## 2015-09-20 ENCOUNTER — Telehealth: Payer: Self-pay | Admitting: Family Medicine

## 2015-09-20 NOTE — Telephone Encounter (Signed)
Pt said he returned your call

## 2015-09-21 NOTE — Telephone Encounter (Signed)
I called the pt and informed him of the lab test results. 

## 2015-10-03 ENCOUNTER — Ambulatory Visit (INDEPENDENT_AMBULATORY_CARE_PROVIDER_SITE_OTHER): Payer: Medicare Other | Admitting: Pharmacist Clinician (PhC)/ Clinical Pharmacy Specialist

## 2015-10-03 DIAGNOSIS — B182 Chronic viral hepatitis C: Secondary | ICD-10-CM

## 2015-10-03 NOTE — Progress Notes (Signed)
Patient ID: Derrick Mata, male   DOB: Jun 06, 1950, 65 y.o.   MRN: [id number] HPI: Derrick Mata is a 65 y.o. male is here to see pharmacy about his hep C treatment.   No results found for: HCVGENOTYPE, HEPCGENOTYPE  Allergies: Allergies  Allergen Reactions  . Penicillins     REACTION: nausea  . Sulfamethoxazole     REACTION: urticaria (hives)    Vitals:    Past Medical History: Past Medical History  Diagnosis Date  . Sleep apnea     refuses CPAP  . Morbid obesity ([place])   . Arthritis     OA of knees  . CHF (congestive heart failure) ([place])   . 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 Depression     Anxiety and Insomnia  . GERD (gastroesophageal reflux disease)   . COPD (chronic obstructive pulmonary disease) (HCC)     > 30 pack yr smoker  . Leg edema   . Erectile dysfunction   . Hep C w/o coma, chronic ([place]) 06/12/2015    Social History: Social History   Social History  . Marital Status: Married    Spouse Name: N/A  . Number of Children: N/A  . Years of Education: N/A   Occupational History  . retired    Social History Main Topics  . Smoking status: Current Every Day Smoker -- 1.00 packs/day for 51 years    Types: Cigarettes    Start date: 05/21/1963  . Smokeless tobacco: Not on file     Comment: not ready to quit yet  . Alcohol Use: No     Comment: DRINKS 1/2 PINT LIQUOR DAILY----QUIT 2012  . Drug Use: No  . Sexual Activity:    Partners: Female     Comment: states no alcohol in 3 weeks   Other Topics Concern  . 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      Home Situation: lives with wife and grandson      Spiritual Beliefs: [name], does not attend church      Lifestyle: no regular exercise, diet is not great but is interested in this          Labs: HEP B S AB (no units)  Date Value   06/19/2015 NEG   HEPATITIS B SURFACE AG (no units)  Date Value  06/19/2015 NEGATIVE   HCV AB (no units)  Date Value  06/14/2015 REACTIVE*    No results found for: HCVGENOTYPE, HEPCGENOTYPE  Hepatitis C RNA quantitative Latest Ref Rng 06/14/2015 02/08/2010  HCV Quantitative <15 IU/mL 318892(H) 161000(H)  HCV Quantitative Log <1.18 log 10 5.50(H) -    AST (U/L)  Date Value  06/14/2015 39*  08/22/2014 30  02/25/2014 39*   ALT (U/L)  Date Value  06/14/2015 29  08/22/2014 23  02/25/2014 27   INR (no units)  Date Value  06/19/2015 1.14  02/08/2010 1.1 ratio*  06/08/2009 1.17    CrCl: CrCl cannot be calculated (Unknown ideal weight.).  Fibrosis Score: F3/4 as assessed by ARFI  Child-Pugh Score: Class A  Previous Treatment Regimen: None  Assessment: Derrick Mata started on Harvoni on 4/28 and is doing really well on it. He has not missed any doses. He seemed to be very compliant with his med. Stressed the importance of  staying on track with his med. Discussed his elastography results with him and what it means. Derrick Mata has F3/4 on the [place]. Appts are set up for labs in 2 wks and with Dr. Linus Salmons in August.  Recommendations:  Cont harvoni 1 PO qday x 3 months F/u VL in 2 wks F/u Dr. Linus Salmons in August  Derrick Mata [place], [place].D., BCPS, AAHIVP Clinical [facility] 10/03/2015, 11:24 AM

## 2015-10-03 NOTE — Patient Instructions (Signed)
Cont Harvoni 1 tablet daily x 3 month Came back in 2 weeks for lab Come back in August for Dr. Linus Salmons visit

## 2015-10-09 MED FILL — *HARVONI 90-400 MG TABLET: 90-400 | 28 days supply | Qty: 28 | Fill #1

## 2015-10-18 ENCOUNTER — Other Ambulatory Visit: Payer: Medicare Other

## 2015-10-18 DIAGNOSIS — B182 Chronic viral hepatitis C: Secondary | ICD-10-CM

## 2015-10-18 LAB — COMPLETE METABOLIC PANEL WITH GFR
ALT: 6 U/L — AB (ref 9–46)
AST: 12 U/L (ref 10–35)
Albumin: 3.5 g/dL — ABNORMAL LOW (ref 3.6–5.1)
Alkaline Phosphatase: 57 U/L (ref 40–115)
BUN: 13 mg/dL (ref 7–25)
CHLORIDE: 106 mmol/L (ref 98–110)
CO2: 22 mmol/L (ref 20–31)
CREATININE: 0.81 mg/dL (ref 0.70–1.25)
Calcium: 8.8 mg/dL (ref 8.6–10.3)
GFR, Est African American: >89 mL/min (ref >=60)
GFR, Est Non African American: >89 mL/min (ref >=60)
GLUCOSE: 95 mg/dL (ref 65–99)
POTASSIUM: 4.4 mmol/L (ref 3.5–5.3)
SODIUM: 141 mmol/L (ref 135–146)
Total Bilirubin: 0.5 mg/dL (ref 0.2–1.2)
Total Protein: 7 g/dL (ref 6.1–8.1)

## 2015-10-19 LAB — HEPATITIS C RNA QUANTITATIVE: HCV QUANT: NOT DETECTED [IU]/mL (ref <15)

## 2015-10-26 ENCOUNTER — Telehealth: Payer: Self-pay | Admitting: General Practice

## 2015-10-26 NOTE — Telephone Encounter (Signed)
This is not something I have prescribed for him. Advise he request from prescribing provider or appt to evaluate. Thanks.

## 2015-10-31 ENCOUNTER — Ambulatory Visit (INDEPENDENT_AMBULATORY_CARE_PROVIDER_SITE_OTHER): Payer: Medicare Other | Admitting: Family Medicine

## 2015-10-31 ENCOUNTER — Encounter: Payer: Self-pay | Admitting: Family Medicine

## 2015-10-31 VITALS — BP 126/82 | HR 80 | Temp 98.0°F | Ht 70.5 in | Wt >= 6400 oz

## 2015-10-31 DIAGNOSIS — M25562 Pain in left knee: Secondary | ICD-10-CM

## 2015-10-31 DIAGNOSIS — M25561 Pain in right knee: Secondary | ICD-10-CM

## 2015-10-31 DIAGNOSIS — E669 Obesity, unspecified: Secondary | ICD-10-CM | POA: Diagnosis not present

## 2015-10-31 DIAGNOSIS — R739 Hyperglycemia, unspecified: Secondary | ICD-10-CM

## 2015-10-31 MED ORDER — DICLOFENAC SODIUM 1 % TD GEL: 2.0000 g | Freq: Two times a day (BID) | TRANSDERMAL | Status: DC | PRN

## 2015-10-31 MED ORDER — LANCETS ULTRA FINE MISC: Status: DC

## 2015-10-31 MED ORDER — GLUCOSE BLOOD VI STRP: ORAL_STRIP | Status: DC

## 2015-10-31 NOTE — Patient Instructions (Signed)
BEFORE YOU LEAVE: -glucose monitor -ensure you have follow up in 3 months  -We placed a referral for you as discussed to the orthopedic specialist about your knee pain. It usually takes about 1-2 weeks to process and schedule this referral. If you have not heard from [place] regarding this appointment in 2 weeks please contact our office.  -in the interim, can try the topical treatment as needed up to twice daily for pain. Also advise very gradually working to increase activity. Water activities would be best to minimize stress on joints. Goal of 30 minutes 5 days per week. Healthy small meals and weight loss can also help.

## 2015-10-31 NOTE — Progress Notes (Signed)
HPI:  Derrick Mata is a pleasant morbidly obese 65 yo with a complicated PMH and a history of poor compliance here for an acute visit for:  Knee pain: -hx severe OA of the knees treated by [facility] per his report in the past -pain is described as mod and limiting activities -treatments in the past: injs - but reports had one and was too expensive -denies: falls, fevers, malaise, regular exercise - he reports he has used topical preparations in the past over-the-counter for his knee pain, but that these did not work at all  - he is afraid to do any exercise, as is afraid this will worsen his arthritis, he reports his orthopedic doctor told him not to walk on his knees - he wants a glucose monitor, feels this would help him with eating better with his hyperglycemia  ROS: See pertinent positives and negatives per HPI.  Past Medical History  Diagnosis Date  . Sleep apnea     refuses CPAP  . Morbid obesity ([place])   . Arthritis     OA of knees  . CHF (congestive heart failure) ([place])   . 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 Depression     Anxiety and Insomnia  . GERD (gastroesophageal reflux disease)   . COPD (chronic obstructive pulmonary disease) (HCC)     > 30 pack yr smoker  . Leg edema   . Erectile dysfunction   . Hep C w/o coma, chronic ([place]) 06/12/2015    Past Surgical History  Procedure Laterality Date  . Lumbar laminectomy  1994, 2010  . Tibia fracture surgery  2006    hardware  . Knee arthroscopy  2011    left  . Tonsillectomy  1963  . Colonoscopy  04/18/2011    Procedure: COLONOSCOPY;  Surgeon: Scarlette Shorts, MD;  Location: [facility];  Service: Endoscopy;  Laterality: N/A;    Family History  Problem Relation Age of Onset  . Colon cancer Sister   . Cancer Sister 109    sister, colon   . Stomach cancer Neg Hx   . Rectal cancer Neg Hx   . Breast cancer Mother   . Cancer Mother    breast  . Heart disease Other   . Heart disease Father 33    Social History   Social History  . Marital Status: Married    Spouse Name: N/A  . Number of Children: N/A  . Years of Education: N/A   Occupational History  . retired    Social History Main Topics  . Smoking status: Current Every Day Smoker -- 1.00 packs/day for 51 years    Types: Cigarettes    Start date: 05/21/1963  . Smokeless tobacco: None     Comment: not ready to quit yet  . Alcohol Use: No     Comment: DRINKS 1/2 PINT LIQUOR DAILY----QUIT 2012  . Drug Use: No  . Sexual Activity:    Partners: Female     Comment: states no alcohol in 3 weeks   Other Topics Concern  . 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  aspirin EC 81 MG tablet, Take 81 mg by mouth daily. , Disp: , Rfl:  .  buPROPion (WELLBUTRIN XL) 150 MG 24 hr tablet, Take 1 tablet (150 mg total) by mouth daily., Disp: 90 tablet, Rfl: 1 .  furosemide (LASIX) 40 MG tablet, TAKE 1 TABLET BY MOUTH TWICE DAILY, Disp: 180 tablet, Rfl: 1 .  Ledipasvir-Sofosbuvir (HARVONI) 90-400 MG TABS, Take 1 tablet by mouth daily., Disp: 28 tablet, Rfl: 2 .  potassium chloride SA (K-DUR,KLOR-CON) 20 MEQ tablet, Take 1 tablet (20 mEq total) by mouth daily., Disp: 90 tablet, Rfl: 1 .  traZODone (DESYREL) 150 MG tablet, Take 1 tablet (150 mg total) by mouth at bedtime., Disp: 90 tablet, Rfl: 1 .  Triamcinolone Acetonide (TRIAMCINOLONE 0.1 % CREAM : EUCERIN) CREA, Apply 1 application topically 2 (two) times daily., Disp: 1 each, Rfl: 1 .  diclofenac sodium (VOLTAREN) 1 % GEL, Apply 2 g topically 2 (two) times daily as needed., Disp: 100 g, Rfl: 0  EXAM:  Filed  Vitals:   10/31/15 0911  BP: 126/82  Pulse: 80  Temp: 98 F (36.7 C)    Body mass index is 59.72 kg/(m^2).  GENERAL: vitals reviewed and listed above, alert, oriented, appears well hydrated and in no acute distress, morbidly obese  HEENT: atraumatic, conjunttiva clear, no obvious abnormalities on inspection of external nose and ears  NECK: no obvious masses on inspection  LUNGS: clear to auscultation bilaterally, no wheezes, rales or rhonchi, good air movement  CV: HRRR, no peripheral edema  MS: moves all extremities without noticeable abnormality, some effusion likely, gait mildly antalgic  PSYCH: pleasant and cooperative, no obvious depression or anxiety  ASSESSMENT AND PLAN:  Discussed the following assessment and plan:  Arthralgia of both knees  Super obesity ([place])  Hyperglycemia  Advised: -follow up with his orthopedic specialist -in interim wt reduction, healthy lifestyle, gradual addition activity - water activities would be best and trial topical nsaids after discussion risks/benefits various options -he asked for glucose monitor to see how various foods impact his BS- provided -follow up 3 months -Patient advised to return or notify a doctor immediately if symptoms worsen or persist or new concerns arise.  Patient Instructions  BEFORE YOU LEAVE: -glucose monitor -ensure you have follow up in 3 months  -We placed a referral for you as discussed to the orthopedic specialist about your knee pain. It usually takes about 1-2 weeks to process and schedule this referral. If you have not heard from [place] regarding this appointment in 2 weeks please contact our office.  -in the interim, can try the topical treatment as needed up to twice daily for pain. Also advise very gradually working to increase activity. Water activities would be best to minimize stress on joints. Goal of 30 minutes 5 days per week. Healthy small meals and weight loss can also help.      Colin Benton R.

## 2015-10-31 NOTE — Progress Notes (Signed)
Pre visit review using our clinic review tool, if applicable. No additional management support is needed unless otherwise documented below in the visit note. 

## 2015-11-02 ENCOUNTER — Telehealth: Payer: Self-pay

## 2015-11-02 NOTE — Progress Notes (Deleted)
PA Case [id number] is Approved. For further questions, call [phone]

## 2015-11-02 NOTE — Progress Notes (Signed)
This encounter was created in error - please disregard.

## 2015-11-02 NOTE — Progress Notes (Signed)
Submitted PA through CoverMyMeds for:  diclofenac sodium (VOLTAREN) 1 % GEL.

## 2015-11-02 NOTE — Addendum Note (Signed)
Addended by: Milta Deiters on: 11/02/2015 04:13 PM   Modules accepted: Level of Service, SmartSet

## 2015-11-02 NOTE — Progress Notes (Signed)
   Subjective:    Patient ID: Derrick Mata, male    DOB: [birth year], 65 y.o.   MRN: [id number]  HPI    Review of Systems     Objective:   Physical Exam        Assessment & Plan:

## 2015-11-02 NOTE — Telephone Encounter (Signed)
Submitted PA through CoverMyMeds [id number] sodium (VOLTAREN) 1 % GEL.  PA Case [id number] is Approved. For further questions, call [phone]

## 2015-11-06 MED FILL — *HARVONI 90-400 MG TABLET: 90-400 | 28 days supply | Qty: 28 | Fill #2

## 2015-12-19 ENCOUNTER — Ambulatory Visit (INDEPENDENT_AMBULATORY_CARE_PROVIDER_SITE_OTHER): Payer: Medicare Other | Admitting: Internal Medicine

## 2015-12-19 ENCOUNTER — Encounter: Payer: Self-pay | Admitting: Internal Medicine

## 2015-12-19 VITALS — BP 156/94 | HR 61 | Temp 97.6°F | Ht 71.0 in | Wt >= 6400 oz

## 2015-12-19 DIAGNOSIS — Z23 Encounter for immunization: Secondary | ICD-10-CM | POA: Diagnosis not present

## 2015-12-19 DIAGNOSIS — B182 Chronic viral hepatitis C: Secondary | ICD-10-CM | POA: Diagnosis not present

## 2015-12-19 DIAGNOSIS — F1911 Other psychoactive substance abuse, in remission: Secondary | ICD-10-CM

## 2015-12-19 DIAGNOSIS — Z87898 Personal history of other specified conditions: Secondary | ICD-10-CM

## 2015-12-19 DIAGNOSIS — K7460 Unspecified cirrhosis of liver: Secondary | ICD-10-CM | POA: Insufficient documentation

## 2015-12-19 NOTE — Assessment & Plan Note (Signed)
None current, no alcohol.

## 2015-12-19 NOTE — Addendum Note (Signed)
Addended by: Landis Gandy on: 12/19/2015 03:22 PM   Modules accepted: Orders

## 2015-12-19 NOTE — Progress Notes (Signed)
   Subjective:    Patient ID: Lawrence Santiago, male    DOB: 1950/09/27, 65 y.o.   MRN: [id number]  HPI Here for follow up of HCV.  Has genotype 1a, never treated and started on Harvoni in April 2017, now having completed treatment.  F3/4 on elastography and ultrasound with nodular contour concerning for cirrhosis.  Getting hepatitis B series, needs hepatitis A series.  No new complaints.  Has had colonoscopy by [facility] in the past.    Review of Systems  Constitutional: Negative for fatigue.  Skin: Negative for rash.  Neurological: Negative for dizziness and headaches.       Objective:   Physical Exam  Constitutional: He appears well-developed and well-nourished. No distress.  Eyes: No scleral icterus.  Cardiovascular: Normal rate, regular rhythm and normal heart sounds.   No murmur heard. Skin: No rash noted.  Psychiatric: He has a normal mood and affect.   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

## 2015-12-19 NOTE — Assessment & Plan Note (Signed)
Will refer to [facility] for ?EDG Already had pneumovax Hepatitis A series, getting B series

## 2015-12-19 NOTE — Assessment & Plan Note (Signed)
Will check end of treatment lab today and rtc 3-4 months for [facility]

## 2015-12-21 LAB — HEPATITIS C RNA QUANTITATIVE: HCV Quantitative: NOT DETECTED IU/mL (ref <15)

## 2016-01-11 ENCOUNTER — Other Ambulatory Visit: Payer: Self-pay | Admitting: Acute Care

## 2016-01-11 DIAGNOSIS — F17210 Nicotine dependence, cigarettes, uncomplicated: Secondary | ICD-10-CM

## 2016-01-31 NOTE — Progress Notes (Signed)
HPI:  Derrick Mata is a pleasant morbidly obese 65 yo with a complicated PMH and a history of poor compliance here for follow up. Often comes late and arrived late today.  Concerns and/or follow up today:  Morbid Obesity/CdCHF/Dependant Edema/Hyperglycemia: -reports has lost > 100lbs - used to weigh > 500lbs -drinks soda and bread is his weakness -no regular exercise, reports bad OA of the knees -eval with cardiology in 2014 -meds:asa, lasix 40mg  bid, potassium chloride -has OA of knees  Tobacco Use/COPD: -1ppd for > 30 years -denies: SOB, DOE -reports not interested in quitting, aware of risks -he did lung cancer screening with [facility] 06/2015  Hx ofDepression, Anxiety, hx substance abuse and Insomnia/OSA: -meds: wellbutrin 150mg  daily and trazadone 150 mg nightly started by prior PCP -reports depression and anxiety well controlled -reported at initial visit, last bout with drugs and alcohol was in 2012 - when lost father and brother and law -denies SI or panic, denies any depression in years -used cocaine early in life, in fellowship hall x2 remotely  GERD:  -resolved  ED: -on viagra; started with prior PCP  OSA/COPD: -referred to pulmonology by prior PCP -referred to sleep specialist, refused to wear CPAP  Hepatitis C: -noncompliant history -finally completed Harvoni in 2017 -seeing ID and referred to GI for follow up   ROS: See pertinent positives and negatives per HPI.  Past Medical History:  Diagnosis Date  . Arthritis    OA of knees  . CHF (congestive heart failure) ([place])   . COPD (chronic obstructive pulmonary disease) (HCC)    > 30 pack yr smoker  . Depression    Anxiety and Insomnia  . Erectile dysfunction   . GERD (gastroesophageal reflux disease)   . Hep C w/o coma, chronic ([place]) 06/12/2015  . Leg edema   . Morbid obesity ([place])   . Sleep apnea    refuses CPAP  . Substance abuse    hx alcohol and cocaine use, s/p fellowship  hall tx several times, 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    Past Surgical History:  Procedure Laterality Date  . COLONOSCOPY  04/18/2011   Procedure: COLONOSCOPY;  Surgeon: Scarlette Shorts, MD;  Location: [facility];  Service: Endoscopy;  Laterality: N/A;  . KNEE ARTHROSCOPY  2011   left  . [place], 2010  . TIBIA FRACTURE SURGERY  2006   hardware  . TONSILLECTOMY  1963    Family History  Problem Relation Age of Onset  . Colon cancer Sister   . Cancer Sister 53    sister, colon   . Stomach cancer Neg Hx   . Rectal cancer Neg Hx   . Breast cancer Mother   . Cancer Mother     breast  . Heart disease Other   . Heart disease Father 30    Social History   Social History  . Marital status: Married    Spouse name: N/A  . Number of children: N/A  . Years of education: N/A   Occupational History  . retired    Social History Main Topics  . Smoking status: Current Every Day Smoker    Packs/day: 1.00    Years: 51.00    Types: Cigarettes    Start date: 05/21/1963  . Smokeless tobacco: None     Comment: not ready to quit yet  . Alcohol use No     Comment: DRINKS 1/2 PINT LIQUOR DAILY----QUIT 2012  . Drug use: No  . Sexual  activity: Yes    Partners: Female     Comment: states no alcohol in 3 weeks   Other Topics Concern  . 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  aspirin EC 81 MG tablet, Take 81 mg by mouth daily. , Disp: , Rfl:  .  buPROPion (WELLBUTRIN XL) 150 MG 24 hr tablet, Take 1 tablet (150 mg total) by mouth daily., Disp: 90 tablet, Rfl: 1 .  diclofenac sodium (VOLTAREN) 1 % GEL, Apply 2 g topically 2 (two) times daily as needed., Disp: 100 g, Rfl:  0 .  furosemide (LASIX) 40 MG tablet, TAKE 1 TABLET BY MOUTH TWICE DAILY, Disp: 180 tablet, Rfl: 1 .  glucose blood (ONETOUCH VERIO) test strip, Use as instructed to check blood sugar once a day, Disp: 100 each, Rfl: 1 .  LANCETS ULTRA FINE MISC, Use as directed, Disp: 100 each, Rfl: 1 .  potassium chloride SA (K-DUR,KLOR-CON) 20 MEQ tablet, Take 1 tablet (20 mEq total) by mouth daily., Disp: 90 tablet, Rfl: 1 .  traZODone (DESYREL) 150 MG tablet, Take 1 tablet (150 mg total) by mouth at bedtime., Disp: 90 tablet, Rfl: 1  EXAM:  Vitals:   02/01/16 1028  BP: 132/82  Pulse: (!) 109  Temp: 98.1 F (36.7 C)    Body mass index is 58.8 kg/m.  GENERAL: vitals reviewed and listed above, alert, oriented, appears well hydrated and in no acute distress  HEENT: atraumatic, conjunttiva clear, no obvious abnormalities on inspection of external nose and ears  NECK: no obvious masses on inspection  LUNGS: clear to auscultation bilaterally, no wheezes, rales or rhonchi, good air movement  CV: HRRR, no peripheral edema  MS: moves all extremities without noticeable abnormality  PSYCH: pleasant and cooperative, no obvious depression or anxiety  ASSESSMENT AND PLAN:  Discussed the following assessment and plan:  TOBACCO USE  Depression  Morbid obesity, unspecified obesity type (HCC)  Obesity hypoventilation syndrome (HCC)  CHF (congestive heart failure), NYHA class I, unspecified failure chronicity, combined (HCC)  OSA and COPD overlap syndrome (HCC)  Hepatic cirrhosis, unspecified hepatic cirrhosis type (HCC)  Hyperglycemia  -flu shot -lifestyle recs including healthy diet, regular chair exercises and smoking cessation advised -Patient advised to return or notify a doctor immediately if symptoms worsen or persist or new concerns arise.  Patient Instructions  BEFORE YOU LEAVE: -follow up: 4-6 months -flu shot   We recommend the following healthy lifestyle for LIFE: 1)  Small portions.   Tip: eat off of a salad plate instead of a dinner plate.  Tip: It is ok to feel hungry after a meal - that likely means you ate an appropriate portion.  Tip: if you need more or a snack choose fruits, veggies and/or a handful of nuts or seeds.  2) Eat a healthy clean diet.  * Tip: Avoid (less then 1 serving per week): processed foods, sweets, sweetened drinks, white starches (rice, flour, bread, potatoes, pasta, etc), red meat, fast foods, butter  *Tip: CHOOSE instead   * 5-9 servings per day of fresh or frozen  fruits and vegetables (but not corn, potatoes, bananas, canned or dried fruit)   *nuts and seeds, beans   *olives and olive oil   *small portions of lean meats such as fish and white chicken    *small portions of whole grains  3)Get at least 150 minutes of sweaty aerobic exercise per week as tolerated - chair exercises may be the best. There are numerous videos on youtube.  4)Reduce stress - consider counseling, meditation and relaxation to balance other aspects of your life.     Colin Benton R., DO

## 2016-02-01 ENCOUNTER — Encounter: Payer: Self-pay | Admitting: Family Medicine

## 2016-02-01 ENCOUNTER — Ambulatory Visit (INDEPENDENT_AMBULATORY_CARE_PROVIDER_SITE_OTHER): Payer: Medicare Other | Admitting: Family Medicine

## 2016-02-01 VITALS — BP 132/82 | HR 109 | Temp 98.1°F | Ht 71.0 in | Wt >= 6400 oz

## 2016-02-01 DIAGNOSIS — E662 Morbid (severe) obesity with alveolar hypoventilation: Secondary | ICD-10-CM

## 2016-02-01 DIAGNOSIS — J449 Chronic obstructive pulmonary disease, unspecified: Secondary | ICD-10-CM

## 2016-02-01 DIAGNOSIS — F329 Major depressive disorder, single episode, unspecified: Secondary | ICD-10-CM

## 2016-02-01 DIAGNOSIS — I5040 Unspecified combined systolic (congestive) and diastolic (congestive) heart failure: Secondary | ICD-10-CM | POA: Diagnosis not present

## 2016-02-01 DIAGNOSIS — K7460 Unspecified cirrhosis of liver: Secondary | ICD-10-CM | POA: Diagnosis not present

## 2016-02-01 DIAGNOSIS — G4733 Obstructive sleep apnea (adult) (pediatric): Secondary | ICD-10-CM

## 2016-02-01 DIAGNOSIS — F17200 Nicotine dependence, unspecified, uncomplicated: Secondary | ICD-10-CM | POA: Diagnosis not present

## 2016-02-01 DIAGNOSIS — R739 Hyperglycemia, unspecified: Secondary | ICD-10-CM | POA: Insufficient documentation

## 2016-02-01 DIAGNOSIS — Z23 Encounter for immunization: Secondary | ICD-10-CM

## 2016-02-01 DIAGNOSIS — F32A Depression, unspecified: Secondary | ICD-10-CM

## 2016-02-01 NOTE — Patient Instructions (Signed)
BEFORE YOU LEAVE: -follow up: 4-6 months -flu shot   We recommend the following healthy lifestyle for LIFE: 1) Small portions.   Tip: eat off of a salad plate instead of a dinner plate.  Tip: It is ok to feel hungry after a meal - that likely means you ate an appropriate portion.  Tip: if you need more or a snack choose fruits, veggies and/or a handful of nuts or seeds.  2) Eat a healthy clean diet.  * Tip: Avoid (less then 1 serving per week): processed foods, sweets, sweetened drinks, white starches (rice, flour, bread, potatoes, pasta, etc), red meat, fast foods, butter  *Tip: CHOOSE instead   * 5-9 servings per day of fresh or frozen fruits and vegetables (but not corn, potatoes, bananas, canned or dried fruit)   *nuts and seeds, beans   *olives and olive oil   *small portions of lean meats such as fish and white chicken    *small portions of whole grains  3)Get at least 150 minutes of sweaty aerobic exercise per week as tolerated - chair exercises may be the best. There are numerous videos on youtube.  4)Reduce stress - consider counseling, meditation and relaxation to balance other aspects of your life.

## 2016-02-01 NOTE — Progress Notes (Signed)
Pre visit review using our clinic review tool, if applicable. No additional management support is needed unless otherwise documented below in the visit note. 

## 2016-02-10 ENCOUNTER — Other Ambulatory Visit: Payer: Self-pay | Admitting: Family

## 2016-02-19 ENCOUNTER — Encounter: Payer: Self-pay | Admitting: Internal Medicine

## 2016-02-19 ENCOUNTER — Other Ambulatory Visit: Payer: Self-pay

## 2016-02-19 ENCOUNTER — Ambulatory Visit (INDEPENDENT_AMBULATORY_CARE_PROVIDER_SITE_OTHER): Payer: Medicare Other | Admitting: Internal Medicine

## 2016-02-19 VITALS — BP 130/70 | HR 96 | Ht 71.0 in | Wt >= 6400 oz

## 2016-02-19 DIAGNOSIS — K7460 Unspecified cirrhosis of liver: Secondary | ICD-10-CM

## 2016-02-19 DIAGNOSIS — Z86010 Personal history of colonic polyps: Secondary | ICD-10-CM

## 2016-02-19 MED ORDER — NA SULFATE-K SULFATE-MG SULF 17.5-3.13-1.6 GM/177ML PO SOLN: 1.0000 | Freq: Once | ORAL | 0 refills | Status: AC

## 2016-02-19 NOTE — Patient Instructions (Addendum)
You have been scheduled for an endoscopy and colonoscopy. Please follow the written instructions given to you at your visit today. Please pick up your prep supplies at the pharmacy within the next 1-3 days. If you use inhalers (even only as needed), please bring them with you on the day of your procedure.  If you are age 67 or older, your body mass index should be between 23-30. Your Body mass index is 59.15 kg/m. If this is out of the aforementioned range listed, please consider follow up with your Primary Care Provider.  If you are age 28 or younger, your body mass index should be between 19-25. Your Body mass index is 59.15 kg/m. If this is out of the aformentioned range listed, please consider follow up with your Primary Care Provider.   Miralax (large bottle of Miralax 255 gram and 64 oz of gatorade)  Thank you for choosing [facility]  Dr Scarlette Shorts

## 2016-02-19 NOTE — Progress Notes (Signed)
HISTORY OF PRESENT ILLNESS:  Derrick Mata is a 64 y.o. male with multiple medical problems including morbid obesity, COPD, obstructive sleep apnea, history of congestive heart failure, history of polysubstance abuse, hepatitis C status post successful treatment by ID with Harvoni, suspicion of cirrhosis on ultrasound with elastography, and multiple adenomatous colon polyps. Patient has undergone multiple previous colonoscopies. Last examination November 2012. Follow-up in 5 years recommended. For his liver disease, screening upper endoscopy recommended. Patient's GI review of systems is currently negative. Review of outside laboratories from May 2017 reveal normal liver tests.  REVIEW OF SYSTEMS:  All non-GI ROS negative except for anxiety, arthritis, depression, hearing problems, sleeping problems  Past Medical History:  Diagnosis Date  . Arthritis    OA of knees  . CHF (congestive heart failure) ([place])   . Colon polyp   . COPD (chronic obstructive pulmonary disease) (HCC)    > 30 pack yr smoker  . Depression    Anxiety and Insomnia  . Diverticulosis   . Erectile dysfunction   . GERD (gastroesophageal reflux disease)   . Hemorrhoids   . Hep C w/o coma, chronic ([place]) 06/12/2015  . Leg edema   . Morbid obesity ([place])   . Sleep apnea    refuses CPAP  . Substance abuse    hx alcohol and cocaine use, 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, 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    Past Surgical History:  Procedure Laterality Date  . COLONOSCOPY  04/18/2011   Procedure: COLONOSCOPY;  Surgeon: Scarlette Shorts, MD;  Location: [facility];  Service: Endoscopy;  Laterality: N/A;  . KNEE ARTHROSCOPY  2011   left  . [place], 2010  . TIBIA FRACTURE SURGERY  2006   hardware  . TONSILLECTOMY  1963    Social History Derrick Mata  reports that he has been smoking Cigarettes.  He started smoking about 52 years ago. He has a 51.00 pack-year smoking history. He has never used smokeless  tobacco. He reports that he does not drink alcohol or use drugs.  family history includes Breast cancer in his mother; Cancer in his mother; Cancer (age of onset: 42) in his sister; Colon cancer in his sister; Heart disease in his other; Heart disease (age of onset: 78) in his father.  Allergies  Allergen Reactions  . Penicillins     REACTION: nausea  . Sulfamethoxazole     REACTION: urticaria (hives)       PHYSICAL EXAMINATION: Vital signs: BP 130/70   Pulse 96   Ht 5\' 11"  (1.803 m)   Wt (!) 424 lb 2 oz (192.4 kg)   SpO2 95%   BMI 59.15 kg/m   Constitutional: Pleasant, Morbidly obese and unhealthy appearing, no acute distress Psychiatric: alert and oriented x3, cooperative Eyes: extraocular movements intact, anicteric, conjunctiva pink Mouth: oral pharynx moist, no lesions Neck: supple no lymphadenopathy Cardiovascular: heart regular rate and rhythm, no murmur Lungs: clear to auscultation bilaterally Abdomen: soft, massively obese, nontender, nondistended, no obvious ascites, no peritoneal signs, normal bowel sounds, no organomegaly Rectal: Deferred until colonoscopy Extremities: no clubbing or cyanosis. 1+ lower extremity edema bilaterally Skin: no lesions on visible extremities Neuro: No focal deficits. No asterixis.   ASSESSMENT:  #1. History of adenomatous colon polyps. Due for surveillance #2. History of hepatitis C status post Harvvoni therapy (successful) #3. Question cirrhosis on elastography #4. Morbid obesity #5. Multiple medical problems   PLAN:  #1. Surveillance colonoscopy. High-risk given body habitus and  comorbidities.The nature of the procedure, as well as the risks, benefits, and alternatives were carefully and thoroughly reviewed with the patient. Ample time for discussion and questions allowed. The patient understood, was satisfied, and agreed to proceed. #2. Screening upper endoscopy to rule out varices. High-risk given body habitus and  comorbidities.The nature of the procedure, as well as the risks, benefits, and alternatives were carefully and thoroughly reviewed with the patient. Ample time for discussion and questions allowed. The patient understood, was satisfied, and agreed to proceed. The procedures will need to be performed at the hospital with anesthesia supervising sedation #3. Exercise and weight loss

## 2016-02-20 ENCOUNTER — Other Ambulatory Visit: Payer: Self-pay

## 2016-02-20 DIAGNOSIS — Z86010 Personal history of colonic polyps: Secondary | ICD-10-CM

## 2016-02-20 DIAGNOSIS — K7460 Unspecified cirrhosis of liver: Secondary | ICD-10-CM

## 2016-02-25 ENCOUNTER — Other Ambulatory Visit: Payer: Self-pay | Admitting: Family Medicine

## 2016-02-28 ENCOUNTER — Encounter: Payer: Self-pay | Admitting: Internal Medicine

## 2016-03-25 LAB — HM DIABETES EYE EXAM

## 2016-03-28 ENCOUNTER — Encounter: Payer: Self-pay | Admitting: Family Medicine

## 2016-04-04 ENCOUNTER — Ambulatory Visit (INDEPENDENT_AMBULATORY_CARE_PROVIDER_SITE_OTHER): Payer: Medicare Other | Admitting: Internal Medicine

## 2016-04-04 ENCOUNTER — Encounter: Payer: Self-pay | Admitting: Internal Medicine

## 2016-04-04 VITALS — BP 165/87 | HR 93 | Temp 97.8°F | Ht 71.0 in | Wt >= 6400 oz

## 2016-04-04 DIAGNOSIS — B182 Chronic viral hepatitis C: Secondary | ICD-10-CM | POA: Diagnosis not present

## 2016-04-04 DIAGNOSIS — Z23 Encounter for immunization: Secondary | ICD-10-CM

## 2016-04-04 DIAGNOSIS — K7460 Unspecified cirrhosis of liver: Secondary | ICD-10-CM

## 2016-04-04 NOTE — Addendum Note (Signed)
Addended by: Myrtis Hopping A on: 04/04/2016 02:13 PM   Modules accepted: Orders

## 2016-04-04 NOTE — Assessment & Plan Note (Signed)
Svr 12 today.  Will recheck one last in 46months

## 2016-04-04 NOTE — Assessment & Plan Note (Signed)
I discussed weight loss as part of maintaining liver health.

## 2016-04-04 NOTE — Progress Notes (Signed)
   Subjective:    Patient ID: Derrick Mata, male    DOB: 03-29-1951, 65 y.o.   MRN: [id number]  HPI Here for follow up of HCV.  Has genotype 1a, never treated and started on Harvoni in April 2017, now having completed treatment.  F3/4 on elastography and ultrasound with nodular contour concerning for cirrhosis. Getting hepatitis A and B series.  No new complaints.  EGD in December with Dr. [name].  Had pneumovax, hep B # 3 today and hep A #2 next visit.  No alcohol.     Review of Systems  Constitutional: Negative for fatigue.  Skin: Negative for rash.  Neurological: Negative for dizziness and headaches.       Objective:   Physical Exam  Constitutional: He appears well-developed and well-nourished. No distress.  Eyes: No scleral icterus.  Cardiovascular: Normal rate, regular rhythm and normal heart sounds.   No murmur heard. Skin: No rash noted.  Psychiatric: He has a normal mood and affect.   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

## 2016-04-04 NOTE — Assessment & Plan Note (Signed)
[place] screening ordered today

## 2016-04-05 ENCOUNTER — Telehealth: Payer: Self-pay | Admitting: *Deleted

## 2016-04-05 NOTE — Telephone Encounter (Signed)
Called patient and notified of ultrasound for 04/18/16 at 9:10 AM at [facility]. Nothing to eat or drink after midnight. Myrtis Hopping

## 2016-04-08 LAB — HEPATITIS C RNA QUANTITATIVE: HCV Quantitative: NOT DETECTED IU/mL (ref <15)

## 2016-04-18 ENCOUNTER — Ambulatory Visit
Admission: RE | Admit: 2016-04-18 | Discharge: 2016-04-18 | Disposition: A | Discharge status: Home / Self Care | Payer: Medicare Other | Source: Ambulatory Visit | Attending: Internal Medicine | Admitting: Internal Medicine

## 2016-04-18 DIAGNOSIS — K7460 Unspecified cirrhosis of liver: Secondary | ICD-10-CM

## 2016-05-06 ENCOUNTER — Encounter (HOSPITAL_COMMUNITY): Payer: Self-pay

## 2016-05-07 ENCOUNTER — Ambulatory Visit (HOSPITAL_COMMUNITY)
Admission: RE | Admit: 2016-05-07 | Discharge: 2016-05-07 | Disposition: A | Discharge status: Home / Self Care | Payer: Medicare Other | Source: Ambulatory Visit | Attending: Internal Medicine | Admitting: Internal Medicine

## 2016-05-07 ENCOUNTER — Ambulatory Visit (HOSPITAL_COMMUNITY): Payer: Medicare Other | Admitting: Anesthesiology

## 2016-05-07 ENCOUNTER — Encounter (HOSPITAL_COMMUNITY)
Admission: RE | Disposition: A | Discharge status: Home / Self Care | Payer: Self-pay | Source: Ambulatory Visit | Attending: Internal Medicine

## 2016-05-07 ENCOUNTER — Encounter (HOSPITAL_COMMUNITY): Payer: Self-pay | Admitting: Internal Medicine

## 2016-05-07 DIAGNOSIS — Z1211 Encounter for screening for malignant neoplasm of colon: Secondary | ICD-10-CM | POA: Insufficient documentation

## 2016-05-07 DIAGNOSIS — K7460 Unspecified cirrhosis of liver: Secondary | ICD-10-CM | POA: Diagnosis not present

## 2016-05-07 DIAGNOSIS — Z803 Family history of malignant neoplasm of breast: Secondary | ICD-10-CM | POA: Diagnosis not present

## 2016-05-07 DIAGNOSIS — Z13810 Encounter for screening for upper gastrointestinal disorder: Secondary | ICD-10-CM | POA: Insufficient documentation

## 2016-05-07 DIAGNOSIS — Z79899 Other long term (current) drug therapy: Secondary | ICD-10-CM | POA: Diagnosis not present

## 2016-05-07 DIAGNOSIS — J449 Chronic obstructive pulmonary disease, unspecified: Secondary | ICD-10-CM | POA: Insufficient documentation

## 2016-05-07 DIAGNOSIS — Z6841 Body Mass Index (BMI) 40.0 and over, adult: Secondary | ICD-10-CM | POA: Diagnosis not present

## 2016-05-07 DIAGNOSIS — D128 Benign neoplasm of rectum: Secondary | ICD-10-CM | POA: Diagnosis not present

## 2016-05-07 DIAGNOSIS — Z800 Family history of malignant neoplasm of digestive organs: Secondary | ICD-10-CM | POA: Insufficient documentation

## 2016-05-07 DIAGNOSIS — F17210 Nicotine dependence, cigarettes, uncomplicated: Secondary | ICD-10-CM | POA: Insufficient documentation

## 2016-05-07 DIAGNOSIS — D124 Benign neoplasm of descending colon: Secondary | ICD-10-CM | POA: Insufficient documentation

## 2016-05-07 DIAGNOSIS — Z8249 Family history of ischemic heart disease and other diseases of the circulatory system: Secondary | ICD-10-CM | POA: Diagnosis not present

## 2016-05-07 DIAGNOSIS — Z1212 Encounter for screening for malignant neoplasm of rectum: Secondary | ICD-10-CM

## 2016-05-07 DIAGNOSIS — Z860100 Personal history of colon polyps, unspecified: Secondary | ICD-10-CM

## 2016-05-07 DIAGNOSIS — K5730 Diverticulosis of large intestine without perforation or abscess without bleeding: Secondary | ICD-10-CM | POA: Insufficient documentation

## 2016-05-07 DIAGNOSIS — I509 Heart failure, unspecified: Secondary | ICD-10-CM | POA: Diagnosis not present

## 2016-05-07 DIAGNOSIS — D123 Benign neoplasm of transverse colon: Secondary | ICD-10-CM | POA: Insufficient documentation

## 2016-05-07 DIAGNOSIS — Z7982 Long term (current) use of aspirin: Secondary | ICD-10-CM | POA: Insufficient documentation

## 2016-05-07 DIAGNOSIS — K648 Other hemorrhoids: Secondary | ICD-10-CM | POA: Diagnosis not present

## 2016-05-07 DIAGNOSIS — F329 Major depressive disorder, single episode, unspecified: Secondary | ICD-10-CM | POA: Diagnosis not present

## 2016-05-07 HISTORY — DX: Adverse effect of unspecified anesthetic, initial encounter: T41.45XA

## 2016-05-07 HISTORY — PX: COLONOSCOPY WITH PROPOFOL: SHX5780

## 2016-05-07 HISTORY — PX: ESOPHAGOGASTRODUODENOSCOPY (EGD) WITH PROPOFOL: SHX5813

## 2016-05-07 HISTORY — DX: Other complications of anesthesia, initial encounter: T88.59XA

## 2016-05-07 SURGERY — ESOPHAGOGASTRODUODENOSCOPY (EGD) WITH PROPOFOL
Anesthesia: Monitor Anesthesia Care

## 2016-05-07 MED ORDER — PROPOFOL 10 MG/ML IV BOLUS
INTRAVENOUS | Status: AC
  Filled 2016-05-07: qty 20

## 2016-05-07 MED ORDER — LACTATED RINGERS IV SOLN
INTRAVENOUS | Status: DC
  Administered 2016-05-07: 1000 mL via INTRAVENOUS

## 2016-05-07 MED ORDER — PROPOFOL 10 MG/ML IV BOLUS
INTRAVENOUS | Status: AC
  Filled 2016-05-07: qty 40

## 2016-05-07 MED ORDER — LIDOCAINE 2% (20 MG/ML) 5 ML SYRINGE
INTRAMUSCULAR | Status: DC | PRN
  Administered 2016-05-07: 100 mg via INTRAVENOUS

## 2016-05-07 MED ORDER — LIDOCAINE 2% (20 MG/ML) 5 ML SYRINGE
INTRAMUSCULAR | Status: AC
  Filled 2016-05-07: qty 5

## 2016-05-07 MED ORDER — GLYCOPYRROLATE 0.2 MG/ML IJ SOLN
INTRAMUSCULAR | Status: DC | PRN
  Administered 2016-05-07: 0.2 mg via INTRAVENOUS

## 2016-05-07 MED ORDER — SODIUM CHLORIDE 0.9 % IV SOLN: INTRAVENOUS | Status: DC

## 2016-05-07 MED ORDER — PROPOFOL 10 MG/ML IV BOLUS
INTRAVENOUS | Status: DC | PRN
  Administered 2016-05-07 (×3): 20 mg via INTRAVENOUS

## 2016-05-07 MED ORDER — PROPOFOL 500 MG/50ML IV EMUL
INTRAVENOUS | Status: DC | PRN
  Administered 2016-05-07: 140 ug/kg/min via INTRAVENOUS

## 2016-05-07 SURGICAL SUPPLY — 24 items

## 2016-05-07 NOTE — Discharge Instructions (Signed)
YOU HAD AN ENDOSCOPIC PROCEDURE TODAY: Refer to the procedure report and other information in the discharge instructions given to you for any specific questions about what was found during the examination. If this information does not answer your questions, please call [facility] office at [phone] to clarify.  ° °YOU SHOULD EXPECT: Some feelings of bloating in the abdomen. Passage of more gas than usual. Walking can help get rid of the air that was put into your GI tract during the procedure and reduce the bloating. If you had a lower endoscopy (such as a colonoscopy or flexible sigmoidoscopy) you may notice spotting of blood in your stool or on the toilet paper. Some abdominal soreness may be present for a day or two, also. ° °DIET: Your first meal following the procedure should be a light meal and then it is ok to progress to your normal diet. A half-sandwich or bowl of soup is an example of a good first meal. Heavy or fried foods are harder to digest and may make you feel nauseous or bloated. Drink plenty of fluids but you should avoid alcoholic beverages for 24 hours. If you had a esophageal dilation, please see attached instructions for diet.   ° °ACTIVITY: Your care partner should take you home directly after the procedure. You should plan to take it easy, moving slowly for the rest of the day. You can resume normal activity the day after the procedure however YOU SHOULD NOT DRIVE, use power tools, machinery or perform tasks that involve climbing or major physical exertion for 24 hours (because of the sedation medicines used during the test).  ° °SYMPTOMS TO REPORT IMMEDIATELY: °A gastroenterologist can be reached at any hour. Please call [phone]  for any of the following symptoms:  °Following lower endoscopy (colonoscopy, flexible sigmoidoscopy) °Excessive amounts of blood in the stool  °Significant tenderness, worsening of abdominal pains  °Swelling of the abdomen that is new, acute  °Fever of 100° or  higher  °Following upper endoscopy (EGD, EUS, ERCP, esophageal dilation) °Vomiting of blood or coffee ground material  °New, significant abdominal pain  °New, significant chest pain or pain under the shoulder blades  °Painful or persistently difficult swallowing  °New shortness of breath  °Black, tarry-looking or red, bloody stools ° °FOLLOW UP:  °If any biopsies were taken you will be contacted by phone or by letter within the next 1-3 weeks. Call [phone]  if you have not heard about the biopsies in 3 weeks.  °Please also call with any specific questions about appointments or follow up tests. ° °

## 2016-05-07 NOTE — Op Note (Signed)
[facility] Patient Name: Derrick Mata Procedure Date: 05/07/2016 MRN: [id number] Attending MD: Docia Chuck. [name] , MD Date of Birth: 05/26/1950 CSN: [id number] Age: 65 Admit Type: Outpatient Procedure:                Upper GI endoscopy Indications:              Variceal screening (no known varices or prior                            bleeding) Providers:                Docia Chuck. [name], MD, Dustin Flock RN, RN, Cherylynn Ridges, Technician, Danley Danker, CRNA Referring MD:              Medicines:                Monitored Anesthesia Care Complications:            No immediate complications. Estimated Blood Loss:     Estimated blood loss: none. Procedure:                Pre-Anesthesia Assessment:                           - Prior to the procedure, a History and Physical                            was performed, and patient medications and                            allergies were reviewed. The patient's tolerance of                            previous anesthesia was also reviewed. The risks                            and benefits of the procedure and the sedation                            options and risks were discussed with the patient.                            All questions were answered, and informed consent                            was obtained. Prior Anticoagulants: The patient has                            taken no previous anticoagulant or antiplatelet                            agents. ASA Grade Assessment: III - A patient with  severe systemic disease. After reviewing the risks                            and benefits, the patient was deemed in                            satisfactory condition to undergo the procedure.                           After obtaining informed consent, the endoscope was                            passed under direct vision. Throughout the                            procedure, the  patient's blood pressure, pulse, and                            oxygen saturations were monitored continuously. The                            [id number]) scope was introduced through the                            mouth, and advanced to the second part of duodenum.                            The upper GI endoscopy was accomplished without                            difficulty. The patient tolerated the procedure                            well. Scope In: Scope Out: Findings:      The esophagus was normal.      The stomach was normal.      The examined duodenum was normal.      The cardia and gastric fundus were normal on retroflexion. 3 cm hiatal       hernia was present. Impression:               - Normal esophagus.                           - Normal stomach.                           - Normal examined duodenum.                           - No specimens collected. Moderate Sedation:      none Recommendation:           - Patient has a contact number available for                            emergencies. The signs and symptoms of potential  delayed complications were discussed with the                            patient. Return to normal activities tomorrow.                            Written discharge instructions were provided to the                            patient.                           - Resume previous diet.                           - Continue present medications.                           - Return to the care of your primary provider's Procedure Code(s):        --- Professional ---                           [phone], Esophagogastroduodenoscopy, flexible,                            transoral; diagnostic, including collection of                            specimen(s) by brushing or washing, when performed                            (separate procedure) Diagnosis Code(s):        --- Professional ---                           Z13.810,  Encounter for screening for upper                            gastrointestinal disorder CPT copyright 2016 [employer]. All rights reserved. The codes documented in this report are preliminary and upon coder review may  be revised to meet current compliance requirements. Docia Chuck. [name], MD 05/07/2016 10:32:21 AM This report has been signed electronically. Number of Addenda: 0

## 2016-05-07 NOTE — H&P (Signed)
HISTORY OF PRESENT ILLNESS:  Derrick Mata is a 65 y.o. male with multiple medical problems including morbid obesity, COPD, obstructive sleep apnea, history of congestive heart failure, history of polysubstance abuse, hepatitis C status post successful treatment by ID with Harvoni, suspicion of cirrhosis on ultrasound with elastography, and multiple adenomatous colon polyps. Patient has undergone multiple previous colonoscopies. Last examination November 2012. Follow-up in 5 years recommended. For his liver disease, screening upper endoscopy recommended. Patient's GI review of systems is currently negative. Review of outside laboratories from May 2017 reveal normal liver tests.  REVIEW OF SYSTEMS:  All non-GI ROS negative except for anxiety, arthritis, depression, hearing problems, sleeping problems      Past Medical History:  Diagnosis Date  . Arthritis    OA of knees  . CHF (congestive heart failure) ([place])   . Colon polyp   . COPD (chronic obstructive pulmonary disease) ([facility])    > 30 pack yr smoker  . Depression    Anxiety and Insomnia  . Diverticulosis   . Erectile dysfunction   . GERD (gastroesophageal reflux disease)   . Hemorrhoids   . Hep C w/o coma, chronic ([place]) 06/12/2015  . Leg edema   . Morbid obesity ([place])   . Sleep apnea    refuses CPAP  . Substance abuse    hx alcohol and cocaine use, 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, 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         Past Surgical History:  Procedure Laterality Date  . COLONOSCOPY  04/18/2011   Procedure: COLONOSCOPY;  Surgeon: Scarlette Shorts, MD;  Location: [facility] ENDOSCOPY;  Service: Endoscopy;  Laterality: N/A;  . KNEE ARTHROSCOPY  2011   left  . [place], 2010  . TIBIA FRACTURE SURGERY  2006   hardware  . TONSILLECTOMY  1963    Social History Derrick Mata  reports that he has been smoking Cigarettes.  He started smoking about 52 years ago. He has a 51.00 pack-year  smoking history. He has never used smokeless tobacco. He reports that he does not drink alcohol or use drugs.  family history includes Breast cancer in his mother; Cancer in his mother; Cancer (age of onset: 31) in his sister; Colon cancer in his sister; Heart disease in his other; Heart disease (age of onset: 41) in his father.       Allergies  Allergen Reactions  . Penicillins     REACTION: nausea  . Sulfamethoxazole     REACTION: urticaria (hives)       PHYSICAL EXAMINATION: Vital signs: BP 130/70   Pulse 96   Ht 5\' 11"  (1.803 m)   Wt (!) 424 lb 2 oz (192.4 kg)   SpO2 95%   BMI 59.15 kg/m   Constitutional: Pleasant, Morbidly obese and unhealthy appearing, no acute distress Psychiatric: alert and oriented x3, cooperative Eyes: extraocular movements intact, anicteric, conjunctiva pink Mouth: oral pharynx moist, no lesions Neck: supple no lymphadenopathy Cardiovascular: heart regular rate and rhythm, no murmur Lungs: clear to auscultation bilaterally Abdomen: soft, massively obese, nontender, nondistended, no obvious ascites, no peritoneal signs, normal bowel sounds, no organomegaly Rectal: Deferred until colonoscopy Extremities: no clubbing or cyanosis. 1+ lower extremity edema bilaterally Skin: no lesions on visible extremities Neuro: No focal deficits. No asterixis.   ASSESSMENT:  #1. History of adenomatous colon polyps. Due for surveillance #2. History of hepatitis C status post Harvvoni therapy (successful) #3. Question cirrhosis on elastography #4. Morbid obesity #5. Multiple  medical problems   PLAN:  #1. Surveillance colonoscopy. High-risk given body habitus and comorbidities.The nature of the procedure, as well as the risks, benefits, and alternatives were carefully and thoroughly reviewed with the patient. Ample time for discussion and questions allowed. The patient understood, was satisfied, and agreed to proceed. #2. Screening upper  endoscopy to rule out varices. High-risk given body habitus and comorbidities.The nature of the procedure, as well as the risks, benefits, and alternatives were carefully and thoroughly reviewed with the patient. Ample time for discussion and questions allowed. The patient understood, was satisfied, and agreed to proceed. The procedures will need to be performed at the hospital with anesthesia supervising sedation #3. Exercise and weight loss  GI ATTENDING  Patient presents today for screening colonoscopy and surveillance upper endoscopy as outlined above. No interval clinical issues since his recent office evaluation. The patient was again seen, examined, and provided informed consent preprocedure today.The nature of the procedure, as well as the risks, benefits, and alternatives were carefully and thoroughly reviewed with the patient. Ample time for discussion and questions allowed. The patient understood, was satisfied, and agreed to proceed.  Docia Chuck. Geri [place]in[place]., M.D. [facility] Division of Gastroenterology

## 2016-05-07 NOTE — Anesthesia Preprocedure Evaluation (Addendum)
Anesthesia Evaluation  Patient identified by MRN, date of birth, ID band Patient awake    Reviewed: Allergy & Precautions, H&P , Patient's Chart, lab work & pertinent test results, reviewed documented beta blocker date and time   Airway Mallampati: III  TM Distance: >3 FB Neck ROM: full    Dental no notable dental hx.    Pulmonary Current Smoker,    Pulmonary exam normal breath sounds clear to auscultation       Cardiovascular  Rhythm:regular Rate:Normal     Neuro/Psych    GI/Hepatic   Endo/Other  Morbid obesity  Renal/GU      Musculoskeletal   Abdominal   Peds  Hematology   Anesthesia Other Findings Had MAC prior w/o described difficulty  Sleep apnea  refuses CPAP Morbid obesity  CHF (congestive heart failure) ([facility])   Substance abuse  hx alcohol and cocaine use, GERD (gastroesophageal reflux disease)  COPD (chronic obstructive pulmonary disease) ([facility])  > 30 pack yr smoker   Complication of anesthesia  slow to wake up Hep C w/o com       Reproductive/Obstetrics                            Anesthesia Physical Anesthesia Plan  ASA: III  Anesthesia Plan: MAC   Post-op Pain Management:    Induction: Intravenous  Airway Management Planned: Mask and Natural Airway  Additional Equipment:   Intra-op Plan:   Post-operative Plan:   Informed Consent: I have reviewed the patients History and Physical, chart, labs and discussed the procedure including the risks, benefits and alternatives for the proposed anesthesia with the patient or authorized representative who has indicated his/her understanding and acceptance.   Dental Advisory Given  Plan Discussed with: CRNA and Surgeon  Anesthesia Plan Comments: (Discussed sedation and potential to need to place airway or ETT if warranted by clinical changes intra-operatively. We will start procedure as MAC.)        Anesthesia Quick  Evaluation

## 2016-05-07 NOTE — Transfer of Care (Signed)
Immediate Anesthesia Transfer of Care Note  Patient: Derrick Mata  Procedure(s) Performed: Procedure(s): ESOPHAGOGASTRODUODENOSCOPY (EGD) WITH PROPOFOL (N/A) COLONOSCOPY WITH PROPOFOL (N/A)  Patient Location: Endoscopy Unit  Anesthesia Type:MAC  Level of Consciousness: awake, alert  and oriented  Airway & Oxygen Therapy: Patient Spontanous Breathing and Patient connected to face mask oxygen  Post-op Assessment: Report given to RN and Post -op Vital signs reviewed and stable  Post vital signs: Reviewed and stable  Last Vitals:  Vitals:   05/07/16 0823  BP: 132/83  Pulse: 99  Resp: 15  Temp: 37.1 C    Last Pain:  Vitals:   05/07/16 0823  TempSrc: Oral         Complications: No apparent anesthesia complications

## 2016-05-07 NOTE — Anesthesia Postprocedure Evaluation (Signed)
Anesthesia Post Note  Patient: Derrick Mata  Procedure(s) Performed: Procedure(s) (LRB): ESOPHAGOGASTRODUODENOSCOPY (EGD) WITH PROPOFOL (N/A) COLONOSCOPY WITH PROPOFOL (N/A)  Patient location during evaluation: PACU Anesthesia Type: MAC Level of consciousness: awake and alert Pain management: pain level controlled Vital Signs Assessment: post-procedure vital signs reviewed and stable Respiratory status: spontaneous breathing, nonlabored ventilation, respiratory function stable and patient connected to nasal cannula oxygen Cardiovascular status: stable and blood pressure returned to baseline Anesthetic complications: no       Last Vitals:  Vitals:   05/07/16 1040 05/07/16 1050  BP: 94/79 (!) 109/52  Pulse: 80   Resp: (!) 24   Temp: 36.4 C     Last Pain:  Vitals:   05/07/16 1040  TempSrc: Oral                 Heman Que EDWARD

## 2016-05-07 NOTE — Op Note (Signed)
[facility] Patient Name: Derrick Mata Procedure Date: 05/07/2016 MRN: [id number] Attending MD: Docia Chuck. [name] , MD Date of Birth: March 13, 1951 CSN: [id number] Age: 65 Admit Type: Outpatient Procedure:                Colonoscopy, with cold snare polypectomy x 15 Indications:              Screening for colorectal malignant neoplasm Providers:                Docia Chuck. [name], MD, Dustin Flock RN, RN, Cherylynn Ridges, Technician, Danley Danker, CRNA Referring MD:              Medicines:                Monitored Anesthesia Care Complications:            No immediate complications. Estimated blood loss:                            None. Estimated Blood Loss:     Estimated blood loss: none. Procedure:                Pre-Anesthesia Assessment:                           - Prior to the procedure, a History and Physical                            was performed, and patient medications and                            allergies were reviewed. The patient's tolerance of                            previous anesthesia was also reviewed. The risks                            and benefits of the procedure and the sedation                            options and risks were discussed with the patient.                            All questions were answered, and informed consent                            was obtained. Prior Anticoagulants: The patient has                            taken no previous anticoagulant or antiplatelet                            agents. ASA Grade Assessment: III - A patient with  severe systemic disease. After reviewing the risks                            and benefits, the patient was deemed in                            satisfactory condition to undergo the procedure.                           After obtaining informed consent, the colonoscope                            was passed under direct vision. Throughout the                             procedure, the patient's blood pressure, pulse, and                            oxygen saturations were monitored continuously. The                            [id number] scope was introduced through                            the anus and advanced to the the cecum, identified                            by appendiceal orifice and ileocecal valve. The                            ileocecal valve, appendiceal orifice, and rectum                            were photographed. The quality of the bowel                            preparation was good. The colonoscopy was performed                            without difficulty. The patient tolerated the                            procedure well. The bowel preparation used was                            SUPREP. Scope In: 9:50:17 AM Scope Out: 10:16:23 AM Scope Withdrawal Time: 0 hours 21 minutes 49 seconds  Total Procedure Duration: 0 hours 26 minutes 6 seconds  Findings:      15 polyps were found in the rectum, descending colon and transverse       colon. The polyps were 2 to 6 mm in size. These polyps were removed with       a cold snare. Resection and retrieval were complete.      Multiple diverticula were found in the left colon.  Internal hemorrhoids were found during retroflexion.      The exam was otherwise without abnormality on direct and retroflexion       views. Impression:               - 15 2 to 6 mm polyps in the rectum, in the                            descending colon and in the transverse colon,                            removed with a cold snare. Resected and retrieved.                           - Diverticulosis in the left colon.                           - Internal hemorrhoids.                           - The examination was otherwise normal on direct                            and retroflexion views. Moderate Sedation:      none Recommendation:           - Repeat colonoscopy in 1 year  for surveillance, if                            medically fit.                           - Patient has a contact number available for                            emergencies. The signs and symptoms of potential                            delayed complications were discussed with the                            patient. Return to normal activities tomorrow.                            Written discharge instructions were provided to the                            patient.                           - Resume previous diet.                           - Continue present medications.                           - Await pathology results. Procedure Code(s):        ---  Professional ---                           [phone], Colonoscopy, flexible; with removal of                            tumor(s), polyp(s), or other lesion(s) by snare                            technique Diagnosis Code(s):        --- Professional ---                           Z12.11, Encounter for screening for malignant                            neoplasm of colon                           K62.1, Rectal polyp                           D12.4, Benign neoplasm of descending colon                           D12.3, Benign neoplasm of transverse colon (hepatic                            flexure or splenic flexure)                           K64.8, Other hemorrhoids                           K57.30, Diverticulosis of large intestine without                            perforation or abscess without bleeding CPT copyright 2016 American Medical Association. All rights reserved. The codes documented in this report are preliminary and upon coder review may  be revised to meet current compliance requirements. Docia Chuck. [name], MD 05/07/2016 10:21:09 AM This report has been signed electronically. Number of Addenda: 0

## 2016-05-08 ENCOUNTER — Encounter (HOSPITAL_COMMUNITY): Payer: Self-pay | Admitting: Internal Medicine

## 2016-05-09 ENCOUNTER — Encounter: Payer: Self-pay | Admitting: Internal Medicine

## 2016-06-19 ENCOUNTER — Ambulatory Visit (INDEPENDENT_AMBULATORY_CARE_PROVIDER_SITE_OTHER)
Admission: RE | Admit: 2016-06-19 | Discharge: 2016-06-19 | Disposition: A | Discharge status: Home / Self Care | Payer: Medicare Other | Source: Ambulatory Visit | Attending: Acute Care | Admitting: Acute Care

## 2016-06-19 DIAGNOSIS — F17210 Nicotine dependence, cigarettes, uncomplicated: Secondary | ICD-10-CM

## 2016-06-19 DIAGNOSIS — Z87891 Personal history of nicotine dependence: Secondary | ICD-10-CM | POA: Diagnosis not present

## 2016-06-21 ENCOUNTER — Telehealth: Payer: Self-pay | Admitting: Acute Care

## 2016-06-21 DIAGNOSIS — F17210 Nicotine dependence, cigarettes, uncomplicated: Secondary | ICD-10-CM

## 2016-06-21 NOTE — Telephone Encounter (Signed)
I have called [name] with the results of his low-dose screening CT. His scan was read as a lung RADS 2, indicating nodules that are benign in appearance. Recommendation per radiology is for repeat annual screening in 12 months. We will order and schedule scan for February 2019. These results were called to the patient who verbalized understanding and had no further questions at completion of the call.

## 2016-07-24 ENCOUNTER — Other Ambulatory Visit: Payer: Self-pay | Admitting: Family Medicine

## 2016-07-26 ENCOUNTER — Telehealth: Payer: Self-pay | Admitting: Family Medicine

## 2016-07-26 MED ORDER — DICLOFENAC SODIUM 1 % TD GEL: 2.0000 g | Freq: Two times a day (BID) | TRANSDERMAL | 3 refills | Status: DC

## 2016-07-26 NOTE — Telephone Encounter (Signed)
Pt is calling back he said dr Maudie Mercury prescribed the diclofenac not his orthopedic . [facility] [place]

## 2016-07-26 NOTE — Telephone Encounter (Signed)
Rx done. 

## 2016-07-26 NOTE — Telephone Encounter (Signed)
rxd short term in the past just to get him to the orthopedic appt. Ok to refill x3 month but advise ortho follow up. Thanks.

## 2016-07-30 NOTE — Progress Notes (Signed)
HPI:  Derrick Mata is a pleasant 66 y.o. here for an acute visit for R shoulder pain and chronic pain related to OA. He had some R shoulder pain 3 weeks ago. He thinks strained it - trap muscle area. No weakness, numbness, radiation. He did topical lidocaine then the volataren which helped a lot. Wants to keep the voltaren on hand for mild aches and pains - aware of risks - not taking any oral NSAIDS. The shoulder pain has now resolved. His chronic medical problems are listed below. Decreased activity and poor diet over the winter and feels gained a few lbs.  Morbid Obesity/CdCHF/Dependant Edema/Hyperglycemia: -reports has lost > 100lbs in the past - used to weigh > 500lbs -drinks soda and bread is his weakness -no regular exercise, reports bad OA of the knees -eval with cardiology in 2014 -meds:asa, lasix 40mg  bid, potassium chloride -has OA of knees  Tobacco Use/COPD: -1ppd for > 30 years -denies: SOB, DOE -reports not interested in quitting, aware of risks -he did lung cancer screening with Eric Form 06/2015  Hx of Depression, Anxiety, hx substance abuse and Insomnia/OSA: -meds: wellbutrin 150mg  daily and trazadone 150 mg nightly started by prior PCP -reports depression and anxiety well controlled for many years -reported at initial visit, last bout with drugs and alcohol was in 2012 - when lost relative -used cocaine early in life, in [facility] x[facility] remotely  GERD:  -resolved  ED: -on viagra; started with prior PCP  OSA/COPD/Hx tobacco abuse: -referred to pulmonology by prior PCP -referred to sleep specialist, refused to wear CPAP  Hepatitis C: -noncompliant history -finally completed [facility] in 2017 -seeing ID and referred to GI for follow up  OA: -referred to ortho in the past -uses diclofenac topical - advised of risks/interactions, lidocaine top   ROS: See pertinent positives and negatives per HPI.  Past Medical History:  Diagnosis Date  .  Arthritis    OA of knees  . CHF (congestive heart failure) ([place])   . Colon polyp   . Complication of anesthesia    slow to wake up  . COPD (chronic obstructive pulmonary disease) ([facility])    > 30 pack yr smoker  . Depression    Anxiety and Insomnia  . Diverticulosis   . Erectile dysfunction   . GERD (gastroesophageal reflux disease)   . Hemorrhoids   . Hep C w/o coma, chronic ([place]) 06/12/2015   treated with medications  . Leg edema   . Morbid obesity ([place])   . Sleep apnea    refuses CPAP  . Substance abuse    hx alcohol and cocaine use, 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, 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    Past Surgical History:  Procedure Laterality Date  . COLONOSCOPY  04/18/2011   Procedure: COLONOSCOPY;  Surgeon: Scarlette Shorts, MD;  [facility];  Service: Endoscopy;  Laterality: N/A;  . COLONOSCOPY WITH PROPOFOL N/A 05/07/2016   Procedure: COLONOSCOPY WITH PROPOFOL;  Surgeon: Irene Shipper, MD;  [facility];  Service: Endoscopy;  Laterality: N/A;  . ESOPHAGOGASTRODUODENOSCOPY (EGD) WITH PROPOFOL N/A 05/07/2016   Procedure: ESOPHAGOGASTRODUODENOSCOPY (EGD) WITH PROPOFOL;  Surgeon: Irene Shipper, MD;  [facility];  Service: Endoscopy;  Laterality: N/A;  . KNEE ARTHROSCOPY  2011   left  . [place], 2010  . TIBIA FRACTURE SURGERY  2006   hardware  . TONSILLECTOMY  1963    Family History  Problem Relation Age of Onset  .  Colon cancer Sister   . Cancer Sister 18    sister, colon   . Breast cancer Mother   . Cancer Mother     breast  . Heart disease Other   . Heart disease Father 45  . Stomach cancer Neg Hx   . Rectal cancer Neg Hx     Social History   Social History  . Marital status: Married    Spouse name: N/A  . Number of children: N/A  . Years of education: N/A   Occupational History  . retired    Social History Main Topics  . Smoking status: Current Every Day Smoker    Packs/day: 1.00    Years: 51.00    Types:  Cigarettes    Start date: 05/21/1963  . Smokeless tobacco: Never Used     Comment: not ready to quit yet  . Alcohol use No     Comment: DRINKS 1/2 PINT LIQUOR DAILY----QUIT 2012  . Drug use: No     Comment: last dose November 2012  . Sexual activity: Yes    Partners: Female     Comment: states no alcohol in 3 weeks   Other Topics Concern  . 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  aspirin EC 81 MG tablet, Take 81 mg by mouth daily. , Disp: , Rfl:  .  buPROPion (WELLBUTRIN XL) 150 MG 24 hr tablet, Take 1 tablet (150 mg total) by mouth daily., Disp: 90 tablet, Rfl: 1 .  diclofenac sodium (VOLTAREN) 1 % GEL, Apply 2 g topically 2 (two) times daily., Disp: 100 g, Rfl: 3 .  furosemide (LASIX) 40 MG tablet, TAKE 1 TABLET BY MOUTH TWICE DAILY (Patient taking differently: TAKE 1 TABLET BY MOUTH DAILY), Disp: 180 tablet, Rfl: 1 .  hydroxypropyl methylcellulose / hypromellose (ISOPTO TEARS / GONIOVISC) 2.5 % ophthalmic solution, Place 1 drop into both eyes 3 (three) times daily as needed for dry eyes., Disp: , Rfl:  .  meloxicam (MOBIC) 15 MG tablet, Take 15 mg by mouth daily as needed for pain., Disp: , Rfl:  .  Multiple Vitamin (MULTIVITAMIN WITH MINERALS) TABS tablet, Take 1 tablet by mouth daily., Disp: , Rfl:  .  potassium chloride SA (K-DUR,KLOR-CON) 20 MEQ tablet, TAKE 1 TABLET BY MOUTH EVERY DAY, Disp: 90 tablet, Rfl: 0 .  traZODone (DESYREL) 150 MG tablet, TAKE 1 TABLET BY MOUTH EVERY NIGHT AT BEDTIME, Disp: 90 tablet, Rfl: 3  EXAM:  Vitals:   08/01/16 1101  BP: 120/70  Pulse: 80  Temp: 98.3 F (36.8 C)    Body mass index is 60.85 kg/m.  GENERAL: vitals reviewed and  listed above, alert, oriented, appears well hydrated and in no acute distress  HEENT: atraumatic, conjunttiva clear, no obvious abnormalities on inspection of external nose and ears  NECK: no obvious masses on inspection  LUNGS: clear to auscultation bilaterally, no wheezes, rales or rhonchi, good air movement  CV: HRRR, no peripheral edema  MS: moves all extremities without noticeable abnormality, normal inspection shoulders, no TTP, normal ROM head and neck and  UEs  PSYCH: pleasant and cooperative, no obvious depression or anxiety  ASSESSMENT AND PLAN:  Discussed the following assessment and plan:  Acute pain of right shoulder  Essential hypertension - Plan: CBC, Comprehensive metabolic panel  TOBACCO USE  Morbid obesity ([place]) - Plan: HDL cholesterol, Cholesterol, total  Hyperglycemia - Plan: Hemoglobin A1c  -shoulder issue resolved -discussed risks various treatments for pain, ok with rare use topical voltaren if he tolerates -due for labs - will obtain today -due for Medicare exam and a number of health maintenance measures - advise visit with Health Coach for this and follow up with me to review chronic disease and labs in 1 month -Patient advised to return or notify a doctor immediately if symptoms worsen or persist or new concerns arise.  Patient Instructions  BEFORE YOU LEAVE: -follow up: Medicare visit with Manuela Schwartz in 1 month, follow up with Dr. Maudie Mercury the same day to review labs -labs today  We have ordered labs or studies at this visit. It can take up to 1-2 weeks for results and processing. IF results require follow up or explanation, we will call you with instructions. Clinically stable results will be released to your [facility]. If you have not heard from [place] or cannot find your results in [facility] in 2 weeks please contact our office at [phone].  If you are not yet signed up for [facility], please consider signing up.   We recommend the following healthy lifestyle  for LIFE: 1) Small portions.   Tip: eat off of a salad plate instead of a dinner plate.  Tip: if you need more or a snack choose fruits, veggies and/or a handful of nuts or seeds.  2) Eat a healthy clean diet.  * Tip: Avoid (less then 1 serving per week): processed foods, sweets, sweetened drinks, white starches (rice, flour, bread, potatoes, pasta, etc), red meat, fast foods, butter  *Tip: CHOOSE instead   * 5-9 servings per day of fresh or frozen fruits and vegetables (but not corn, potatoes, bananas, canned or dried fruit)   *nuts and seeds, beans   *olives and olive oil   *small portions of lean meats such as fish and white chicken    *small portions of whole grains  3)Get at least 150 minutes of sweaty aerobic exercise per week.  4)Reduce stress - consider counseling, meditation and relaxation to balance other aspects of your life.          Colin Benton R., DO

## 2016-07-31 ENCOUNTER — Telehealth: Payer: Self-pay

## 2016-07-31 NOTE — Telephone Encounter (Signed)
Received PA request from [facility] for Diclofenac 1% gel. PA submitted & approved. Form faxed back to pharmacy.

## 2016-08-01 ENCOUNTER — Encounter: Payer: Self-pay | Admitting: Family Medicine

## 2016-08-01 ENCOUNTER — Telehealth: Payer: Self-pay

## 2016-08-01 ENCOUNTER — Ambulatory Visit (INDEPENDENT_AMBULATORY_CARE_PROVIDER_SITE_OTHER): Payer: Medicare Other | Admitting: Family Medicine

## 2016-08-01 VITALS — BP 120/70 | HR 80 | Temp 98.3°F | Wt >= 6400 oz

## 2016-08-01 DIAGNOSIS — F17200 Nicotine dependence, unspecified, uncomplicated: Secondary | ICD-10-CM

## 2016-08-01 DIAGNOSIS — M25511 Pain in right shoulder: Secondary | ICD-10-CM | POA: Diagnosis not present

## 2016-08-01 DIAGNOSIS — I10 Essential (primary) hypertension: Secondary | ICD-10-CM

## 2016-08-01 DIAGNOSIS — R739 Hyperglycemia, unspecified: Secondary | ICD-10-CM | POA: Diagnosis not present

## 2016-08-01 LAB — CBC
HEMATOCRIT: 44.9 % (ref 39.0–52.0)
HEMOGLOBIN: 14.7 g/dL (ref 13.0–17.0)
MCHC: 32.7 g/dL (ref 30.0–36.0)
MCV: 84.8 fl (ref 78.0–100.0)
Platelets: 259 10*3/uL (ref 150.0–400.0)
RBC: 5.3 Mil/uL (ref 4.22–5.81)
RDW: 14.6 % (ref 11.5–15.5)
WBC: 8.8 10*3/uL (ref 4.0–10.5)

## 2016-08-01 LAB — COMPREHENSIVE METABOLIC PANEL
ALT: 5 U/L (ref 0–53)
AST: 9 U/L (ref 0–37)
Albumin: 3.7 g/dL (ref 3.5–5.2)
Alkaline Phosphatase: 61 U/L (ref 39–117)
BUN: 10 mg/dL (ref 6–23)
CHLORIDE: 106 meq/L (ref 96–112)
CO2: 27 mEq/L (ref 19–32)
Calcium: 9.2 mg/dL (ref 8.4–10.5)
Creatinine, Ser: 0.83 mg/dL (ref 0.40–1.50)
GFR: 118.64 mL/min (ref >60.00)
GLUCOSE: 92 mg/dL (ref 70–99)
Potassium: 4.5 mEq/L (ref 3.5–5.1)
Sodium: 140 mEq/L (ref 135–145)
Total Bilirubin: 0.5 mg/dL (ref 0.2–1.2)
Total Protein: 7.4 g/dL (ref 6.0–8.3)

## 2016-08-01 LAB — HEMOGLOBIN A1C: HEMOGLOBIN A1C: 5.8 % (ref 4.6–6.5)

## 2016-08-01 LAB — HDL CHOLESTEROL: HDL: 43.3 mg/dL (ref >39.00)

## 2016-08-01 LAB — CHOLESTEROL, TOTAL: CHOLESTEROL: 183 mg/dL (ref 0–200)

## 2016-08-01 NOTE — Patient Instructions (Signed)
BEFORE YOU LEAVE: -follow up: Medicare visit with Manuela Schwartz in 1 month, follow up with Dr. Maudie Mercury the same day to review labs -labs today  We have ordered labs or studies at this visit. It can take up to 1-2 weeks for results and processing. IF results require follow up or explanation, we will call you with instructions. Clinically stable results will be released to your [facility]. If you have not heard from [place] or cannot find your results in [facility] in 2 weeks please contact our office at [phone].  If you are not yet signed up for [facility], please consider signing up.   We recommend the following healthy lifestyle for LIFE: 1) Small portions.   Tip: eat off of a salad plate instead of a dinner plate.  Tip: if you need more or a snack choose fruits, veggies and/or a handful of nuts or seeds.  2) Eat a healthy clean diet.  * Tip: Avoid (less then 1 serving per week): processed foods, sweets, sweetened drinks, white starches (rice, flour, bread, potatoes, pasta, etc), red meat, fast foods, butter  *Tip: CHOOSE instead   * 5-9 servings per day of fresh or frozen fruits and vegetables (but not corn, potatoes, bananas, canned or dried fruit)   *nuts and seeds, beans   *olives and olive oil   *small portions of lean meats such as fish and white chicken    *small portions of whole grains  3)Get at least 150 minutes of sweaty aerobic exercise per week.  4)Reduce stress - consider counseling, meditation and relaxation to balance other aspects of your life.

## 2016-08-01 NOTE — Telephone Encounter (Signed)
Received PA request from [facility] for Voltaren gel. PA submitted & is pending. Key: [id number]

## 2016-08-07 NOTE — Telephone Encounter (Signed)
PA denied, patient has to try 2 of the following: Celecoxib Diflunisal Edtodolac Flurbiprofen Ketoprofen Nabumetone Piroxicam Sulindac

## 2016-08-08 NOTE — Telephone Encounter (Signed)
Would not recommend any oral options given his other comorbidities. Can see if he still wishes to have rx despite insurance refusal to pay.

## 2016-08-09 NOTE — Telephone Encounter (Signed)
Message sent to patient

## 2016-08-24 ENCOUNTER — Emergency Department (HOSPITAL_BASED_OUTPATIENT_CLINIC_OR_DEPARTMENT_OTHER): Payer: Medicare Other

## 2016-08-24 ENCOUNTER — Encounter (HOSPITAL_BASED_OUTPATIENT_CLINIC_OR_DEPARTMENT_OTHER): Payer: Self-pay | Admitting: *Deleted

## 2016-08-24 ENCOUNTER — Emergency Department (HOSPITAL_BASED_OUTPATIENT_CLINIC_OR_DEPARTMENT_OTHER)
Admission: EM | Admit: 2016-08-24 | Discharge: 2016-08-24 | Disposition: A | Discharge status: Home / Self Care | Payer: Medicare Other | Attending: Emergency Medicine | Admitting: Emergency Medicine

## 2016-08-24 DIAGNOSIS — Z79899 Other long term (current) drug therapy: Secondary | ICD-10-CM | POA: Diagnosis not present

## 2016-08-24 DIAGNOSIS — M4856XA Collapsed vertebra, not elsewhere classified, lumbar region, initial encounter for fracture: Secondary | ICD-10-CM | POA: Insufficient documentation

## 2016-08-24 DIAGNOSIS — F17210 Nicotine dependence, cigarettes, uncomplicated: Secondary | ICD-10-CM | POA: Insufficient documentation

## 2016-08-24 DIAGNOSIS — I110 Hypertensive heart disease with heart failure: Secondary | ICD-10-CM | POA: Diagnosis not present

## 2016-08-24 DIAGNOSIS — Z7982 Long term (current) use of aspirin: Secondary | ICD-10-CM | POA: Insufficient documentation

## 2016-08-24 DIAGNOSIS — M545 Low back pain, unspecified: Secondary | ICD-10-CM

## 2016-08-24 DIAGNOSIS — S32010A Wedge compression fracture of first lumbar vertebra, initial encounter for closed fracture: Secondary | ICD-10-CM

## 2016-08-24 DIAGNOSIS — I509 Heart failure, unspecified: Secondary | ICD-10-CM | POA: Insufficient documentation

## 2016-08-24 LAB — URINALYSIS, ROUTINE W REFLEX MICROSCOPIC
Glucose, UA: NEGATIVE mg/dL
Hgb urine dipstick: NEGATIVE
KETONES UR: 15 mg/dL — AB
Nitrite: NEGATIVE
PH: 6 (ref 5.0–8.0)
PROTEIN: NEGATIVE mg/dL
SPECIFIC GRAVITY, URINE: 1.026 (ref 1.005–1.030)

## 2016-08-24 LAB — URINALYSIS, MICROSCOPIC (REFLEX)

## 2016-08-24 MED ORDER — OXYCODONE-ACETAMINOPHEN 5-325 MG PO TABS: 1.0000 | ORAL_TABLET | Freq: Four times a day (QID) | ORAL | 0 refills | Status: DC | PRN

## 2016-08-24 MED ORDER — METHOCARBAMOL 500 MG PO TABS: 500.0000 mg | ORAL_TABLET | Freq: Two times a day (BID) | ORAL | 0 refills | Status: DC

## 2016-08-24 MED ORDER — HYDROMORPHONE HCL 1 MG/ML IJ SOLN
2.0000 mg | Freq: Once | INTRAMUSCULAR | Status: AC
  Administered 2016-08-24: 2 mg via INTRAMUSCULAR
  Filled 2016-08-24: qty 2

## 2016-08-24 NOTE — ED Provider Notes (Signed)
[facility] DEPT Provider Note   CSN: [id number] Arrival date & time: 08/24/16  1145     History   Chief Complaint Chief Complaint  Patient presents with  . Back Pain    HPI Derrick Mata is a 66 y.o. male.  HPI  Pt with hx of prior back surgery presenting with ongoing and worsening left sided low back pain.  He points to left lower lumbar region.  States pain radiates around to left side of abdomen at times.  Pain is worse depending on position and movement.  No weakness of legs.  No urinary retention.  No incontinence of bowel or bladder or dysuria.  No fever or chills.  No falls or injuries.  He has tried tylenol without relief.  He had prior back surgeries last in 2010 by Dr. Gladstone Lighter.  There are no other associated systemic symptoms, there are no other alleviating or modifying factors.   Past Medical History:  Diagnosis Date  . Arthritis    OA of knees  . CHF (congestive heart failure) ([place])   . Colon polyp   . Complication of anesthesia    slow to wake up  . COPD (chronic obstructive pulmonary disease) ([facility])    > 30 pack yr smoker  . Depression    Anxiety and Insomnia  . Diverticulosis   . Erectile dysfunction   . GERD (gastroesophageal reflux disease)   . Hemorrhoids   . Hep C w/o coma, chronic ([place]) 06/12/2015   treated with medications  . Leg edema   . Morbid obesity ([place])   . Sleep apnea    refuses CPAP  . Sleep apnea   . 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 Colon cancer screening   . Benign neoplasm of transverse colon   . Benign neoplasm of descending colon   . Benign neoplasm of rectum   . Cirrhosis of liver without ascites ([place])   . Hyperglycemia 02/01/2016  . Hepatic cirrhosis ([place]) 12/19/2015  . Super obesity ([place]) 07/18/2015  . Obesity hypoventilation syndrome ([place]) 07/18/2015  . CHF (congestive heart failure),  NYHA class I ([place]) 07/18/2015  . COPD exacerbation ([place]) 07/18/2015  . H/O: substance abuse 07/18/2015  . OSA and COPD overlap syndrome ([place]) 07/18/2015  . Noncompliance with CPAP treatment 07/18/2015  . Moderate smoker (20 or less per day) 06/19/2015  . Morbid obesity ([place]) 06/12/2015  . Hep C w/o coma, chronic ([place]) 06/12/2015  . ERECTILE DYSFUNCTION 01/31/2010  . KNEE PAIN, LEFT, CHRONIC 05/18/2009  . TOBACCO USE 06/23/2007  . INSOMNIA 06/23/2007  . Depression 11/24/2006  . Essential hypertension 11/24/2006  . OSTEOARTHRITIS 11/24/2006  . OSA (obstructive sleep apnea) 11/24/2006    Past Surgical History:  Procedure Laterality Date  . COLONOSCOPY  04/18/2011   Procedure: COLONOSCOPY;  Surgeon: Scarlette Shorts, MD;  [facility];  Service: Endoscopy;  Laterality: N/A;  . COLONOSCOPY WITH PROPOFOL N/A 05/07/2016   Procedure: COLONOSCOPY WITH PROPOFOL;  Surgeon: Irene Shipper, MD;  [facility];  Service: Endoscopy;  Laterality: N/A;  . ESOPHAGOGASTRODUODENOSCOPY (EGD) WITH PROPOFOL N/A 05/07/2016   Procedure: ESOPHAGOGASTRODUODENOSCOPY (EGD) WITH PROPOFOL;  Surgeon: Irene Shipper, MD;  [facility];  Service: Endoscopy;  Laterality: N/A;  . KNEE ARTHROSCOPY  2011   left  . [place], 2010  . TIBIA FRACTURE  SURGERY  2006   hardware  . TONSILLECTOMY  1963       Home Medications    Prior to Admission medications   Medication Sig Start Date End Date Taking? Authorizing Provider  aspirin EC 81 MG tablet Take 81 mg by mouth daily.    Yes Historical Provider, MD  diclofenac sodium (VOLTAREN) 1 % GEL Apply 2 g topically 2 (two) times daily. 07/26/16  Yes Lucretia Kern, DO  Multiple Vitamin (MULTIVITAMIN WITH MINERALS) TABS tablet Take 1 tablet by mouth daily.   Yes Historical Provider, MD  traZODone (DESYREL) 150 MG tablet TAKE 1 TABLET BY MOUTH EVERY NIGHT AT BEDTIME 02/26/16  Yes Lucretia Kern, DO  buPROPion (WELLBUTRIN XL) 150 MG 24 hr tablet  Take 1 tablet (150 mg total) by mouth daily. 08/25/14   Kennyth Arnold, FNP  furosemide (LASIX) 40 MG tablet TAKE 1 TABLET BY MOUTH TWICE DAILY Patient taking differently: TAKE 1 TABLET BY MOUTH DAILY 10/19/14   Kennyth Arnold, FNP  hydroxypropyl methylcellulose / hypromellose (ISOPTO TEARS / GONIOVISC) 2.5 % ophthalmic solution Place 1 drop into both eyes 3 (three) times daily as needed for dry eyes.    Historical Provider, MD  meloxicam (MOBIC) 15 MG tablet Take 15 mg by mouth daily as needed for pain.    Historical Provider, MD  methocarbamol (ROBAXIN) 500 MG tablet Take 1 tablet (500 mg total) by mouth 2 (two) times daily. 08/24/16   Alfonzo Beers, MD  oxyCODONE-acetaminophen (PERCOCET/ROXICET) 5-325 MG tablet Take 1-2 tablets by mouth every 6 (six) hours as needed for severe pain. 08/24/16   Alfonzo Beers, MD  potassium chloride SA (K-DUR,KLOR-CON) 20 MEQ tablet TAKE 1 TABLET BY MOUTH EVERY DAY 02/13/16   Lucretia Kern, DO    Family History Family History  Problem Relation Age of Onset  . Colon cancer Sister   . Cancer Sister 72    sister, colon   . Breast cancer Mother   . Cancer Mother     breast  . Heart disease Other   . Heart disease Father 7  . Stomach cancer Neg Hx   . Rectal cancer Neg Hx     Social History Social History  Substance Use Topics  . Smoking status: Current Every Day Smoker    Packs/day: 1.00    Years: 51.00    Types: Cigarettes    Start date: 05/21/1963  . Smokeless tobacco: Never Used     Comment: not ready to quit yet  . Alcohol use No     Comment: DRINKS 1/2 PINT LIQUOR DAILY----QUIT 2012     Allergies   Penicillins and Sulfamethoxazole   Review of Systems Review of Systems  ROS reviewed and all otherwise negative except for mentioned in HPI   Physical Exam Updated Vital Signs BP (!) 148/98 (BP Location: Right Arm)   Pulse 84   Temp 98.6 F (37 C) (Oral)   Resp 20   Ht 5\' 11"  (1.803 m)   Wt (!) 197.8 kg   SpO2 97%   BMI 60.81 kg/m    Vitals reviewed Physical Exam Physical Examination: General appearance - alert, morbidly obese, uncomfortable, and in no distress Mental status - alert, oriented to person, place, and time Eyes - no conjunctival injection, no scleral icterus Chest - clear to auscultation, no wheezes, rales or rhonchi, symmetric air entry Heart - normal rate, regular rhythm, normal S1, S2, no murmurs, rubs, clicks or gallops Back exam - ttp in lower  lumbar spine, also left paraspinal tenderness- on re-exam after xrays obtained some mild ttp over L1 area but more pain in lower lumbar region Neurological - alert, oriented, normal speech, strength 5/5 in lower extremities x 4, sensation intact Musculoskeletal - no joint tenderness, deformity or swelling Extremities - peripheral pulses normal, no pedal edema, no clubbing or cyanosis Skin - normal coloration and turgor, no rashes  ED Treatments / Results  Labs (all labs ordered are listed, but only abnormal results are displayed) Labs Reviewed  URINALYSIS, ROUTINE W REFLEX MICROSCOPIC - Abnormal; Notable for the following:       Result Value   APPearance CLOUDY (*)    Bilirubin Urine SMALL (*)    Ketones, ur 15 (*)    Leukocytes, UA TRACE (*)    All other components within normal limits  URINALYSIS, MICROSCOPIC (REFLEX) - Abnormal; Notable for the following:    Bacteria, UA FEW (*)    Squamous Epithelial / LPF 0-5 (*)    All other components within normal limits    EKG  EKG Interpretation None       Radiology Dg Lumbar Spine Complete  Result Date: 08/24/2016 CLINICAL DATA:  Two week history of low back pain. Pain is progressively worsening. History of lower back surgery x2. Pain is sharp with radiation to groin. EXAM: LUMBAR SPINE - COMPLETE 4+ VIEW COMPARISON:  None. FINDINGS: Slight compression deformity involving the anterior aspects of the L1 superior endplate, of uncertain age. Remainder of the vertebral bodies are normal in height. No  fracture line or displaced fracture fragment identified. Disc desiccation L5-S1 with associated disc space narrowing and mild osseous spurring. Milder degenerative change at the L4-5 level. Additional degenerative osseous hypertrophy amongst the posterior facets at the L4-5 and L5-S1 levels. IMPRESSION: 1. Mild wedge compression deformity involving the superior endplate of the L1 vertebral body, of uncertain age, possibly acute compression fracture. Consider MRI lumbar spine to assess acuity. 2. Mild degenerative change within the lower lumbar spine. Electronically Signed   By: Franki Cabot M.D.   On: 08/24/2016 15:52    Procedures Procedures (including critical care time)  Medications Ordered in ED Medications  HYDROmorphone (DILAUDID) injection 2 mg (2 mg Intramuscular Given 08/24/16 1504)     Initial Impression / Assessment and Plan / ED Course  I have reviewed the triage vital signs and the nursing notes.  Pertinent labs & imaging results that were available during my care of the patient were reviewed by me and considered in my medical decision making (see chart for details).     Pt presenting with low back pain/left sided which is worsening over the past several weeks.  Symptoms are most c/w musculoskeletal back pain.  No signs or symtpoms of cauda equina.  No fever to suggest epidural abcess.  No hematuria to suggest renal stone. No findings of UTI/pyelo- uA appears contaminated- but 0-5 WBC and negative nitrites.  Pt treated for his pain in the ED.  Xray found compression deformity of L1- on re-exam pt does endorse some pain in that area but more pain in lower lumbar spine- no hx of injury- unclear if this is chronic or acute finding.  Will need MRI to further investigate.  Pt will arrange for f/u with Dr. Gladstone Lighter- given rx for pain control until he can f/u as outpatient.  Discharged with strict return precautions.  Pt agreeable with plan.  Final Clinical Impressions(s) / ED Diagnoses    Final diagnoses:  Acute left-sided low back  pain without sciatica  Closed compression fracture of first lumbar vertebra, initial encounter [facility])    New Prescriptions Discharge Medication List as of 08/24/2016  4:09 PM    START taking these medications   Details  methocarbamol (ROBAXIN) 500 MG tablet Take 1 tablet (500 mg total) by mouth 2 (two) times daily., Starting Sat 08/24/2016, Print    oxyCODONE-acetaminophen (PERCOCET/ROXICET) 5-325 MG tablet Take 1-2 tablets by mouth every 6 (six) hours as needed for severe pain., Starting Sat 08/24/2016, Print         Alfonzo Beers, MD 08/25/16 1321

## 2016-08-24 NOTE — ED Triage Notes (Signed)
Patient states he has had two week history of lower back pain.  States the pain is progressively getting worse and is affecting his sleep.  States he has a past history of lower back surgery x 2.  Describes the pain as sharp and radiating into his groin.

## 2016-08-24 NOTE — ED Notes (Signed)
Pt states he has had back pain for the past 2 weeks but is gettting worse and is unable to get comfortable to sleep at night.

## 2016-08-24 NOTE — Discharge Instructions (Signed)
Return to the ED with any concerns including weakness of legs, not able to urinate, loss of control of bowel or bladder, fever/chills, or any other alarming symptoms

## 2016-09-03 ENCOUNTER — Encounter (INDEPENDENT_AMBULATORY_CARE_PROVIDER_SITE_OTHER): Payer: Self-pay | Admitting: Orthopaedic Surgery

## 2016-09-03 ENCOUNTER — Ambulatory Visit (INDEPENDENT_AMBULATORY_CARE_PROVIDER_SITE_OTHER): Payer: Medicare Other | Admitting: Orthopaedic Surgery

## 2016-09-03 ENCOUNTER — Ambulatory Visit: Payer: Medicare Other | Admitting: Family Medicine

## 2016-09-03 DIAGNOSIS — S32010A Wedge compression fracture of first lumbar vertebra, initial encounter for closed fracture: Secondary | ICD-10-CM

## 2016-09-03 HISTORY — DX: Wedge compression fracture of first lumbar vertebra, initial encounter for closed fracture: S32.010A

## 2016-09-03 MED ORDER — HYDROCODONE-ACETAMINOPHEN 7.5-325 MG PO TABS: 1.0000 | ORAL_TABLET | Freq: Two times a day (BID) | ORAL | 0 refills | Status: DC | PRN

## 2016-09-03 NOTE — Addendum Note (Signed)
Addended by: Precious Bard on: 09/03/2016 08:58 AM   Modules accepted: Orders

## 2016-09-03 NOTE — Progress Notes (Signed)
Office Visit Note   Patient: Derrick Mata           Date of Birth: 1951/05/08           MRN: [id number] Visit Date: 09/03/2016              Requested by: Lucretia Kern, DO [place], [place] [address] PCP: Lucretia Kern., DO   Assessment & Plan: Visit Diagnoses:  1. Closed compression fracture of first lumbar vertebra, initial encounter ([place])     Plan: MRI L spine to assess acuity of L1 fx.  Lumbar corset for support.  norco Rx.  f/u after MRI.  Follow-Up Instructions: Return in about 2 weeks (around 09/17/2016).   Orders:  No orders of the defined types were placed in this encounter.  Meds ordered this encounter  Medications  . HYDROcodone-acetaminophen (NORCO) 7.5-325 MG tablet    Sig: Take 1 tablet by mouth 2 (two) times daily as needed for moderate pain.    Dispense:  40 tablet    Refill:  0      Procedures: No procedures performed   Clinical Data: No additional findings.   Subjective: Chief Complaint  Patient presents with  . Lower Back - Pain, Fracture    66 yo male comes in with 3 week h/o LBP w/o radicular pain.  Went to ER and xrays showed possible L1 comp fx.  Pain is limited to back and radiates around left flank.  Denies any dysuria, constitutional sxs.      Review of Systems  Constitutional: Negative.   All other systems reviewed and are negative.    Objective: Vital Signs: There were no vitals taken for this visit.  Physical Exam  Constitutional: He is oriented to person, place, and time. He appears well-developed and well-nourished.  HENT:  Head: Normocephalic and atraumatic.  Eyes: Pupils are equal, round, and reactive to light.  Neck: Neck supple.  Pulmonary/Chest: Effort normal.  Abdominal: Soft.  Musculoskeletal: Normal range of motion.  Neurological: He is alert and oriented to person, place, and time.  Skin: Skin is warm.  Psychiatric: He has a normal mood and affect. His behavior is normal. Judgment and  thought content normal.  Nursing note and vitals reviewed.   Ortho Exam TTP over L1 vertebra.  No focal findings.  Symmetric reflexes. Specialty Comments:  No specialty comments available.  Imaging: No results found.   PMFS History: Patient Active Problem List   Diagnosis Date Noted  . Closed compression fracture of L1 lumbar vertebra ([place]) 09/03/2016  . Colon cancer screening   . Benign neoplasm of transverse colon   . Benign neoplasm of descending colon   . Benign neoplasm of rectum   . Cirrhosis of liver without ascites ([place])   . Hyperglycemia 02/01/2016  . Hepatic cirrhosis ([place]) 12/19/2015  . Super obesity 07/18/2015  . Obesity hypoventilation syndrome ([place]) 07/18/2015  . CHF (congestive heart failure), NYHA class I ([place]) 07/18/2015  . COPD exacerbation ([place]) 07/18/2015  . H/O: substance abuse 07/18/2015  . OSA and COPD overlap syndrome ([place]) 07/18/2015  . Noncompliance with CPAP treatment 07/18/2015  . Moderate smoker (20 or less per day) 06/19/2015  . Morbid obesity ([place]) 06/12/2015  . Hep C w/o coma, chronic ([place]) 06/12/2015  . ERECTILE DYSFUNCTION 01/31/2010  . KNEE PAIN, LEFT, CHRONIC 05/18/2009  . TOBACCO USE 06/23/2007  . INSOMNIA 06/23/2007  . Depression 11/24/2006  . Essential hypertension 11/24/2006  . OSTEOARTHRITIS 11/24/2006  .  OSA (obstructive sleep apnea) 11/24/2006   Past Medical History:  Diagnosis Date  . Arthritis    OA of knees  . CHF (congestive heart failure) ([place])   . Colon polyp   . Complication of anesthesia    slow to wake up  . COPD (chronic obstructive pulmonary disease) ([facility])    > 30 pack yr smoker  . Depression    Anxiety and Insomnia  . Diverticulosis   . Erectile dysfunction   . GERD (gastroesophageal reflux disease)   . Hemorrhoids   . Hep C w/o coma, chronic ([place]) 06/12/2015   treated with medications  . Leg edema   . Morbid obesity ([place])   . Sleep apnea    refuses CPAP  . Sleep apnea   . Substance abuse    hx  alcohol and cocaine use, 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, 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    Family History  Problem Relation Age of Onset  . Colon cancer Sister   . Cancer Sister 26    sister, colon   . Breast cancer Mother   . Cancer Mother     breast  . Heart disease Other   . Heart disease Father 25  . Stomach cancer Neg Hx   . Rectal cancer Neg Hx     Past Surgical History:  Procedure Laterality Date  . COLONOSCOPY  04/18/2011   Procedure: COLONOSCOPY;  Surgeon: Scarlette Shorts, MD;  Location: [facility];  Service: Endoscopy;  Laterality: N/A;  . COLONOSCOPY WITH PROPOFOL N/A 05/07/2016   Procedure: COLONOSCOPY WITH PROPOFOL;  Surgeon: Irene Shipper, MD;  [facility];  Service: Endoscopy;  Laterality: N/A;  . ESOPHAGOGASTRODUODENOSCOPY (EGD) WITH PROPOFOL N/A 05/07/2016   Procedure: ESOPHAGOGASTRODUODENOSCOPY (EGD) WITH PROPOFOL;  Surgeon: Irene Shipper, MD;  [facility];  Service: Endoscopy;  Laterality: N/A;  . KNEE ARTHROSCOPY  2011   left  . [place], 2010  . TIBIA FRACTURE SURGERY  2006   hardware  . TONSILLECTOMY  1963   Social History   Occupational History  . retired    Social History Main Topics  . Smoking status: Current Every Day Smoker    Packs/day: 1.00    Years: 51.00    Types: Cigarettes    Start date: 05/21/1963  . Smokeless tobacco: Never Used     Comment: not ready to quit yet  . Alcohol use No     Comment: DRINKS 1/2 PINT LIQUOR DAILY----QUIT 2012  . Drug use: No     Comment: last dose November 2012  . Sexual activity: Yes    Partners: Female     Comment: states no alcohol in 3 weeks

## 2016-09-09 NOTE — Progress Notes (Deleted)
HPI:  Derrick Mata is a pleasant 66 y.o. here for follow up chronic conditions. Seeing Derrick Mata today for his AWV. Chronic medical problems summarized below were reviewed for changes and stability and were updated as needed below. These issues and their treatment remain stable for the most part. ***. Denies CP, SOB, DOE, treatment intolerance or new symptoms. CBC, lipids, Hgba1c and CMP all done recently and ok. Morbid Obesity/CdCHF/Dependant Edema/Hyperglycemia: -reports has lost > 100lbs in the past - used to weigh > 500lbs -drinks soda and bread is his weakness -no regular exercise, reports bad OA of the knees -eval with cardiology in 2014 -meds:asa, lasix 40mg  bid, potassium chloride -has OA of knees  Tobacco Use/COPD: -1ppd for > 30 years -denies: SOB, DOE -reports not interested in quitting, aware of risks -he did lung cancer screening with Eric Form 06/2015  Hx of Depression, Anxiety, hx substance abuse and Insomnia/OSA: -meds: wellbutrin 150mg  daily and trazadone 150 mg nightly started by prior PCP -reports depression and anxiety well controlled for many years -reported at initial visit, last bout with drugs and alcohol was in 2012 - when lost relative -used cocaine early in life, in [facility] x2 remotely  GERD:  -resolved  ED: -on viagra; startedwith prior PCP  OSA/COPD/Hx tobacco abuse: -referred to pulmonology by prior PCP -referred to sleep specialist, refused to wear CPAP  Hepatitis C: -noncompliant history -finally completed Harvoni in 2017 -seeing ID and referred to GI for follow up  OA: -referred to ortho in the past -uses diclofenac topical - advised of risks/interactions, lidocaine top   ROS: See pertinent positives and negatives per HPI.  Past Medical History:  Diagnosis Date  . Arthritis    OA of knees  . CHF (congestive heart failure) ([place])   . Colon polyp   . Complication of anesthesia    slow to wake up  . COPD  (chronic obstructive pulmonary disease) ([facility])    > 30 pack yr smoker  . Depression    Anxiety and Insomnia  . Diverticulosis   . Erectile dysfunction   . GERD (gastroesophageal reflux disease)   . Hemorrhoids   . Hep C w/o coma, chronic ([place]) 06/12/2015   treated with medications  . Leg edema   . Morbid obesity ([place])   . Sleep apnea    refuses CPAP  . Sleep apnea   . Substance abuse    hx alcohol and cocaine use, 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, 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    Past Surgical History:  Procedure Laterality Date  . COLONOSCOPY  04/18/2011   Procedure: COLONOSCOPY;  Surgeon: Scarlette Shorts, MD;  [facility];  Service: Endoscopy;  Laterality: N/A;  . COLONOSCOPY WITH PROPOFOL N/A 05/07/2016   Procedure: COLONOSCOPY WITH PROPOFOL;  Surgeon: Irene Shipper, MD;  [facility];  Service: Endoscopy;  Laterality: N/A;  . ESOPHAGOGASTRODUODENOSCOPY (EGD) WITH PROPOFOL N/A 05/07/2016   Procedure: ESOPHAGOGASTRODUODENOSCOPY (EGD) WITH PROPOFOL;  Surgeon: Irene Shipper, MD;  [facility];  Service: Endoscopy;  Laterality: N/A;  . KNEE ARTHROSCOPY  2011   left  . [place], 2010  . TIBIA FRACTURE SURGERY  2006   hardware  . TONSILLECTOMY  1963    Family History  Problem Relation Age of Onset  . Colon cancer Sister   . Cancer Sister 5    sister, colon   . Breast cancer Mother   . Cancer Mother     breast  .  Heart disease Other   . Heart disease Father 20  . Stomach cancer Neg Hx   . Rectal cancer Neg Hx     Social History   Social History  . Marital status: Married    Spouse name: N/A  . Number of children: N/A  . Years of education: N/A   Occupational History  . retired    Social History Main Topics  . Smoking status: Current Every Day Smoker    Packs/day: 1.00    Years: 51.00    Types: Cigarettes    Start date: 05/21/1963  . Smokeless tobacco: Never Used     Comment: not ready to quit yet  . Alcohol use No      Comment: DRINKS 1/2 PINT LIQUOR DAILY----QUIT 2012  . Drug use: No     Comment: last dose November 2012  . Sexual activity: Yes    Partners: Female     Comment: states no alcohol in 3 weeks   Other Topics Concern  . 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  aspirin EC 81 MG tablet, Take 81 mg by mouth daily. , Disp: , Rfl:  .  buPROPion (WELLBUTRIN XL) 150 MG 24 hr tablet, Take 1 tablet (150 mg total) by mouth daily., Disp: 90 tablet, Rfl: 1 .  diclofenac sodium (VOLTAREN) 1 % GEL, Apply 2 g topically 2 (two) times daily., Disp: 100 g, Rfl: 3 .  furosemide (LASIX) 40 MG tablet, TAKE 1 TABLET BY MOUTH TWICE DAILY (Patient taking differently: TAKE 1 TABLET BY MOUTH DAILY), Disp: 180 tablet, Rfl: 1 .  HYDROcodone-acetaminophen (NORCO) 7.5-325 MG tablet, Take 1 tablet by mouth 2 (two) times daily as needed for moderate pain., Disp: 40 tablet, Rfl: 0 .  hydroxypropyl methylcellulose / hypromellose (ISOPTO TEARS / GONIOVISC) 2.5 % ophthalmic solution, Place 1 drop into both eyes 3 (three) times daily as needed for dry eyes., Disp: , Rfl:  .  meloxicam (MOBIC) 15 MG tablet, Take 15 mg by mouth daily as needed for pain., Disp: , Rfl:  .  methocarbamol (ROBAXIN) 500 MG tablet, Take 1 tablet (500 mg total) by mouth 2 (two) times daily., Disp: 20 tablet, Rfl: 0 .  Multiple Vitamin (MULTIVITAMIN WITH MINERALS) TABS tablet, Take 1 tablet by mouth daily., Disp: , Rfl:  .  oxyCODONE-acetaminophen (PERCOCET/ROXICET) 5-325 MG tablet, Take 1-2 tablets by mouth every 6 (six) hours as needed for severe pain., Disp: 15 tablet, Rfl: 0 .  potassium chloride SA (K-DUR,KLOR-CON) 20 MEQ tablet, TAKE 1  TABLET BY MOUTH EVERY DAY, Disp: 90 tablet, Rfl: 0 .  traZODone (DESYREL) 150 MG tablet, TAKE 1 TABLET BY MOUTH EVERY NIGHT AT BEDTIME, Disp: 90 tablet, Rfl: 3  EXAM:  There were no vitals filed for this visit.  There is no height or weight on file to calculate BMI.  GENERAL: vitals reviewed and listed above, alert, oriented, appears well hydrated and in no acute distress  HEENT: atraumatic, conjunttiva clear, no obvious abnormalities on inspection of external nose and ears  NECK: no obvious masses on  inspection  LUNGS: clear to auscultation bilaterally, no wheezes, rales or rhonchi, good air movement  CV: HRRR, no peripheral edema  MS: moves all extremities without noticeable abnormality  PSYCH: pleasant and cooperative, no obvious depression or anxiety  ASSESSMENT AND PLAN:  Discussed the following assessment and plan:  No diagnosis found.  -Patient advised to return or notify a doctor immediately if symptoms worsen or persist or new concerns arise.  There are no Patient Instructions on file for this visit.  Colin Benton R., DO

## 2016-09-10 ENCOUNTER — Ambulatory Visit: Payer: Medicare Other | Admitting: Family Medicine

## 2016-09-17 ENCOUNTER — Ambulatory Visit (INDEPENDENT_AMBULATORY_CARE_PROVIDER_SITE_OTHER): Payer: Medicare Other | Admitting: Orthopaedic Surgery

## 2016-09-21 ENCOUNTER — Ambulatory Visit
Admission: RE | Admit: 2016-09-21 | Discharge: 2016-09-21 | Disposition: A | Discharge status: Home / Self Care | Payer: Medicare Other | Source: Ambulatory Visit | Attending: Orthopaedic Surgery | Admitting: Orthopaedic Surgery

## 2016-09-21 DIAGNOSIS — S32010A Wedge compression fracture of first lumbar vertebra, initial encounter for closed fracture: Secondary | ICD-10-CM

## 2016-09-26 ENCOUNTER — Ambulatory Visit (INDEPENDENT_AMBULATORY_CARE_PROVIDER_SITE_OTHER): Payer: Medicare Other | Admitting: Orthopaedic Surgery

## 2016-09-26 DIAGNOSIS — S32010A Wedge compression fracture of first lumbar vertebra, initial encounter for closed fracture: Secondary | ICD-10-CM | POA: Diagnosis not present

## 2016-09-26 NOTE — Progress Notes (Signed)
Office Visit Note   Patient: Derrick Mata           Date of Birth: 1950/10/04           MRN: [id number] Visit Date: 09/26/2016              Requested by: Lucretia Kern, DO [address] [place], [place] [address] PCP: Lucretia Kern, DO   Assessment & Plan: Visit Diagnoses:  1. Closed compression fracture of first lumbar vertebra, initial encounter [facility])     Plan: MRI shows no evidence of L1 compression fracture. Mainly postsurgical and degenerative changes. MRI findings were discussed with the patient. Continue symptomatically treatment. Follow up with me as needed.  Follow-Up Instructions: Return if symptoms worsen or fail to improve.   Orders:  No orders of the defined types were placed in this encounter.  No orders of the defined types were placed in this encounter.     Procedures: No procedures performed   Clinical Data: No additional findings.   Subjective: No chief complaint on file.   Patient returns today for review of his MRI. He denies any radicular symptoms. His pain is improved.    Review of Systems   Objective: Vital Signs: There were no vitals taken for this visit.  Physical Exam  Ortho Exam Lumbar exam is stable. Specialty Comments:  No specialty comments available.  Imaging: No results found.   PMFS History: Patient Active Problem List   Diagnosis Date Noted  . Closed compression fracture of L1 lumbar vertebra ([place]) 09/03/2016  . Colon cancer screening   . Benign neoplasm of transverse colon   . Benign neoplasm of descending colon   . Benign neoplasm of rectum   . Cirrhosis of liver without ascites ([place])   . Hyperglycemia 02/01/2016  . Hepatic cirrhosis ([place]) 12/19/2015  . Super obesity 07/18/2015  . Obesity hypoventilation syndrome ([place]) 07/18/2015  . CHF (congestive heart failure), NYHA class I ([place]) 07/18/2015  . COPD exacerbation ([place]) 07/18/2015  . H/O: substance abuse 07/18/2015  . OSA and COPD overlap  syndrome ([place]) 07/18/2015  . Noncompliance with CPAP treatment 07/18/2015  . Moderate smoker (20 or less per day) 06/19/2015  . Morbid obesity ([place]) 06/12/2015  . Hep C w/o coma, chronic ([place]) 06/12/2015  . ERECTILE DYSFUNCTION 01/31/2010  . KNEE PAIN, LEFT, CHRONIC 05/18/2009  . TOBACCO USE 06/23/2007  . INSOMNIA 06/23/2007  . Depression 11/24/2006  . Essential hypertension 11/24/2006  . OSTEOARTHRITIS 11/24/2006  . OSA (obstructive sleep apnea) 11/24/2006   Past Medical History:  Diagnosis Date  . Arthritis    OA of knees  . CHF (congestive heart failure) ([place])   . Colon polyp   . Complication of anesthesia    slow to wake up  . COPD (chronic obstructive pulmonary disease) ([facility])    > 30 pack yr smoker  . Depression    Anxiety and Insomnia  . Diverticulosis   . Erectile dysfunction   . GERD (gastroesophageal reflux disease)   . Hemorrhoids   . Hep C w/o coma, chronic ([place]) 06/12/2015   treated with medications  . Leg edema   . Morbid obesity ([place])   . Sleep apnea    refuses CPAP  . Sleep apnea   . Substance abuse    hx alcohol and cocaine use, 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, 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    Family History  Problem Relation Age of Onset  . Colon cancer Sister   .  Cancer Sister 70       sister, colon   . Breast cancer Mother   . Cancer Mother        breast  . Heart disease Other   . Heart disease Father 67  . Stomach cancer Neg Hx   . Rectal cancer Neg Hx     Past Surgical History:  Procedure Laterality Date  . COLONOSCOPY  04/18/2011   Procedure: COLONOSCOPY;  Surgeon: Scarlette Shorts, MD;  [facility];  Service: Endoscopy;  Laterality: N/A;  . COLONOSCOPY WITH PROPOFOL N/A 05/07/2016   Procedure: COLONOSCOPY WITH PROPOFOL;  Surgeon: Irene Shipper, MD;  [facility];  Service: Endoscopy;  Laterality: N/A;  . ESOPHAGOGASTRODUODENOSCOPY (EGD) WITH PROPOFOL N/A 05/07/2016   Procedure: ESOPHAGOGASTRODUODENOSCOPY (EGD) WITH  PROPOFOL;  Surgeon: Irene Shipper, MD;  [facility];  Service: Endoscopy;  Laterality: N/A;  . KNEE ARTHROSCOPY  2011   left  . [place], 2010  . TIBIA FRACTURE SURGERY  2006   hardware  . TONSILLECTOMY  1963   Social History   Occupational History  . retired    Social History Main Topics  . Smoking status: Current Every Day Smoker    Packs/day: 1.00    Years: 51.00    Types: Cigarettes    Start date: 05/21/1963  . Smokeless tobacco: Never Used     Comment: not ready to quit yet  . Alcohol use No     Comment: DRINKS 1/2 PINT LIQUOR DAILY----QUIT 2012  . Drug use: No     Comment: last dose November 2012  . Sexual activity: Yes    Partners: Female     Comment: states no alcohol in 3 weeks

## 2016-10-03 ENCOUNTER — Ambulatory Visit (INDEPENDENT_AMBULATORY_CARE_PROVIDER_SITE_OTHER): Payer: Medicare Other | Admitting: Internal Medicine

## 2016-10-03 ENCOUNTER — Encounter: Payer: Self-pay | Admitting: Internal Medicine

## 2016-10-03 VITALS — BP 173/81 | HR 65 | Temp 97.9°F | Ht 72.0 in | Wt >= 6400 oz

## 2016-10-03 DIAGNOSIS — F17200 Nicotine dependence, unspecified, uncomplicated: Secondary | ICD-10-CM

## 2016-10-03 DIAGNOSIS — K7460 Unspecified cirrhosis of liver: Secondary | ICD-10-CM

## 2016-10-03 DIAGNOSIS — Z23 Encounter for immunization: Secondary | ICD-10-CM | POA: Diagnosis not present

## 2016-10-03 DIAGNOSIS — B182 Chronic viral hepatitis C: Secondary | ICD-10-CM | POA: Diagnosis not present

## 2016-10-03 LAB — COMPLETE METABOLIC PANEL WITH GFR
ALT: 6 U/L — AB (ref 9–46)
AST: 12 U/L (ref 10–35)
Albumin: 3.6 g/dL (ref 3.6–5.1)
Alkaline Phosphatase: 52 U/L (ref 40–115)
BUN: 11 mg/dL (ref 7–25)
CHLORIDE: 106 mmol/L (ref 98–110)
CO2: 18 mmol/L — AB (ref 20–31)
Calcium: 8.6 mg/dL (ref 8.6–10.3)
Creat: 0.94 mg/dL (ref 0.70–1.25)
GFR, EST NON AFRICAN AMERICAN: 84 mL/min (ref >=60)
GLUCOSE: 89 mg/dL (ref 65–99)
Potassium: 5.1 mmol/L (ref 3.5–5.3)
SODIUM: 141 mmol/L (ref 135–146)
Total Bilirubin: 0.4 mg/dL (ref 0.2–1.2)
Total Protein: 7 g/dL (ref 6.1–8.1)

## 2016-10-03 NOTE — Assessment & Plan Note (Signed)
Will do [place] screning now and in 6 months. rtc 1 year

## 2016-10-03 NOTE — Assessment & Plan Note (Signed)
Discussed cessation 

## 2016-10-03 NOTE — Assessment & Plan Note (Signed)
SVR 24 today and  Will be considered cured if negative.

## 2016-10-03 NOTE — Addendum Note (Signed)
Addended by: Roma Kayser on: 10/03/2016 04:11 PM   Modules accepted: Orders

## 2016-10-03 NOTE — Progress Notes (Signed)
   Subjective:    Patient ID: Derrick Mata, male    DOB: 12/21/1950, 66 y.o.   MRN: [id number]  HPI Here for follow up of HCV.  Has genotype 1a, never treated and started on Harvoni in April 2017, now having completed treatment.  F3/4 on elastography and ultrasound with cirrhosis. Needs one last hepatitis A vaccine.  No new issues.   Drinks caffeine.      Review of Systems  Constitutional: Negative for fatigue.  Skin: Negative for rash.  Neurological: Negative for dizziness and headaches.       Objective:   Physical Exam  Constitutional: He appears well-developed and well-nourished. No distress.  Eyes: No scleral icterus.  Cardiovascular: Normal rate, regular rhythm and normal heart sounds.   No murmur heard. Skin: No rash noted.    SH: no alcohol, + tobacco      Assessment & Plan:

## 2016-10-04 ENCOUNTER — Telehealth: Payer: Self-pay

## 2016-10-04 NOTE — Telephone Encounter (Signed)
Called Pt to inform him of upcoming appointment at [facility] at 1030 10/11/16 at their [address] location. Pt was advised not to eat after Midnight and to call if he had any questions.

## 2016-10-05 LAB — HEPATITIS C RNA QUANTITATIVE
HCV QUANT LOG: NOT DETECTED {Log_IU}/mL
HCV QUANT: NOT DETECTED [IU]/mL

## 2016-10-11 ENCOUNTER — Other Ambulatory Visit: Payer: Medicare Other

## 2016-10-17 ENCOUNTER — Ambulatory Visit
Admission: RE | Admit: 2016-10-17 | Discharge: 2016-10-17 | Disposition: A | Discharge status: Home / Self Care | Payer: Medicare Other | Source: Ambulatory Visit | Attending: Internal Medicine | Admitting: Internal Medicine

## 2016-10-17 DIAGNOSIS — K7460 Unspecified cirrhosis of liver: Secondary | ICD-10-CM

## 2016-11-15 ENCOUNTER — Telehealth: Payer: Self-pay | Admitting: Family Medicine

## 2016-11-15 NOTE — Telephone Encounter (Signed)
Form filled out and placed in Dr. Julianne Rice folder for distribution

## 2016-11-15 NOTE — Telephone Encounter (Signed)
Pt need a disability handicap placard renewed.  And would like to pick it up today with a call when ready for pick up.

## 2016-11-18 NOTE — Telephone Encounter (Signed)
Left a message on identified cell informing the pt that paper work is available for pick up at the front desk.

## 2016-11-22 DIAGNOSIS — Z0289 Encounter for other administrative examinations: Secondary | ICD-10-CM

## 2016-12-04 ENCOUNTER — Other Ambulatory Visit: Payer: Self-pay | Admitting: Family Medicine

## 2016-12-04 MED ORDER — BUPROPION HCL ER (XL) 150 MG PO TB24: 150.0000 mg | ORAL_TABLET | Freq: Every day | ORAL | 1 refills | Status: DC

## 2016-12-04 NOTE — Telephone Encounter (Signed)
Pt needs new rx omeprazole 20 mg and refills on kdur 20 meq, bupropion 150 mg send to [address] each w/refills

## 2016-12-05 MED ORDER — POTASSIUM CHLORIDE CRYS ER 20 MEQ PO TBCR: 20.0000 meq | EXTENDED_RELEASE_TABLET | Freq: Every day | ORAL | 0 refills | Status: DC

## 2016-12-05 MED ORDER — BUPROPION HCL ER (XL) 150 MG PO TB24: 150.0000 mg | ORAL_TABLET | Freq: Every day | ORAL | 0 refills | Status: DC

## 2016-12-05 NOTE — Telephone Encounter (Signed)
Rxs sent for K-Dur and Bupropion with a note stating the pt needs an appt.  Rx for Omeprazole is not on the pts current medication list and was sent to Dr Maudie Mercury for approval.

## 2016-12-05 NOTE — Addendum Note (Signed)
Addended by: Agnes Lawrence on: 12/05/2016 10:11 AM   Modules accepted: Orders

## 2016-12-06 NOTE — Telephone Encounter (Signed)
I left a message for the pt to return my call. 

## 2016-12-06 NOTE — Telephone Encounter (Signed)
Needs AWV with [facility] and follow up with me. Thanks.he can get PPI over the counter if needed in interim.

## 2016-12-06 NOTE — Telephone Encounter (Signed)
Patient called back and I scheduled an appt on 8/17 to see Manuela Schwartz and Dr Maudie Mercury.

## 2016-12-27 ENCOUNTER — Telehealth: Payer: Self-pay | Admitting: Family Medicine

## 2016-12-27 MED ORDER — FUROSEMIDE 40 MG PO TABS: 40.0000 mg | ORAL_TABLET | Freq: Every day | ORAL | 1 refills | Status: DC

## 2016-12-27 NOTE — Telephone Encounter (Signed)
Pt states he has been taking continuously.  He had been taking his mother in law's rx that had been left over because she died. But now there are no more left from her Rx. Pt states he takes this for his heart. Pt states he takes with the potassium chloride which was filled last week.  Now he is out.

## 2016-12-27 NOTE — Telephone Encounter (Signed)
See if he is taking on regular basis or as needed. See if rx is being sent by another prescriber? Ok to refill per how he is taking for 3 months. Thanks.

## 2016-12-27 NOTE — Telephone Encounter (Signed)
Rx done and I called the pt and left detailed message with this information at his cell number.

## 2016-12-27 NOTE — Telephone Encounter (Signed)
Pt needs a refill on furosemide 40 mg #90 w/refills send to [address]

## 2017-01-02 NOTE — Progress Notes (Signed)
HPI:  Derrick Mata is a pleasant 66 y.o. here for follow up. Chronic medical problems summarized below were reviewed for changes. Seeing Derrick Mata today for AWV. Reports he is doing well for the most part. Reports his specialist seeing him for his history of hepatitis C and abnormal liver ultrasound told him that he is cured and that his liver is never going to be a problem. Reports he occasionally has headaches. They started about 6 months ago. He has a mild biparietal headache lasts for several hours one to 3 times per week. He reports he does not take any medicines for this visit is mild. He does not want to do any tests or workup for this at this time. He has a history of cluster headaches remotely. Denies weakness, numbness, vision changes, nausea, vomiting, malaise, light or noise sensitivity. Denies CP, SOB, DOE, treatment intolerance or new symptoms.  Morbid Obesity/CdCHF/Dependant Edema/Hyperglycemia: -reports has lost > 100lbs in the past - used to weigh > 500lbs -drinks soda and bread is his weakness -no regular exercise, reports bad OA of the knees -eval with cardiology in 2014 -meds:asa, lasix 40mg  bid, potassium chloride -has OA of knees  Tobacco Use/COPD: -1ppd for > 30 years -denies: SOB, DOE -reports not interested in quitting, aware of risks -he did lung cancer screening with Eric Form 06/2015  Hx of Depression, Anxiety, hx substance abuse and Insomnia/OSA: -meds: wellbutrin 150mg  daily and trazadone 150 mg nightly started by prior PCP -reports depression and anxiety well controlled for many years -reported at initial visit, last bout with drugs and alcohol was in 2012 - when lost relative -used cocaine early in life, in [facility] x2 remotely  GERD:  -resolved  ED: -on viagra; startedwith prior PCP  OSA/COPD/Hx tobacco abuse: -referred to pulmonology by prior PCP -referred to sleep specialist, refused to wear CPAP  Hepatitis  C/Cirrhosis: -noncompliant history -finally completed [facility] in 2017 -seeing ID and referred to GI for follow up  OA: -referred to ortho in the past -uses diclofenac topical - advised of risks/interactions, lidocaine top  ROS: See pertinent positives and negatives per HPI.  Past Medical History:  Diagnosis Date  . Arthritis    OA of knees  . CHF (congestive heart failure) ([place])   . Colon polyp   . Complication of anesthesia    slow to wake up  . COPD (chronic obstructive pulmonary disease) (HCC)    > 30 pack yr smoker  . Depression    Anxiety and Insomnia  . Diverticulosis   . Erectile dysfunction   . GERD (gastroesophageal reflux disease)   . Hemorrhoids   . Hep C w/o coma, chronic ([place]) 06/12/2015   treated with medications  . Leg edema   . Morbid obesity ([place])   . Sleep apnea    refuses CPAP  . Sleep apnea   . Substance abuse    hx alcohol and cocaine use, 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, 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    Past Surgical History:  Procedure Laterality Date  . COLONOSCOPY  04/18/2011   Procedure: COLONOSCOPY;  Surgeon: Scarlette Shorts, MD;  Location: [facility];  Service: Endoscopy;  Laterality: N/A;  . COLONOSCOPY WITH PROPOFOL N/A 05/07/2016   Procedure: COLONOSCOPY WITH PROPOFOL;  Surgeon: Irene Shipper, MD;  Location: [facility];  Service: Endoscopy;  Laterality: N/A;  . ESOPHAGOGASTRODUODENOSCOPY (EGD) WITH PROPOFOL N/A 05/07/2016   Procedure: ESOPHAGOGASTRODUODENOSCOPY (EGD) WITH PROPOFOL;  Surgeon: Irene Shipper, MD;  Location: [facility];  Service: Endoscopy;  Laterality: N/A;  . KNEE ARTHROSCOPY  2011   left  . [place], 2010  . TIBIA FRACTURE SURGERY  2006   hardware  . TONSILLECTOMY  1963    Family History  Problem Relation Age of Onset  . Colon cancer Sister   . Cancer Sister 71       sister, colon   . Breast cancer Mother   . Cancer Mother        breast  . Heart disease Other   . Heart disease Father 50  .  Stomach cancer Neg Hx   . Rectal cancer Neg Hx     Social History   Social History  . Marital status: Married    Spouse name: N/A  . Number of children: N/A  . Years of education: N/A   Occupational History  . retired    Social History Main Topics  . Smoking status: Current Every Day Smoker    Packs/day: 1.00    Years: 51.00    Types: Cigarettes    Start date: 05/21/1963  . Smokeless tobacco: Never Used     Comment: not ready to quit yet  . Alcohol use No     Comment: DRINKS 1/2 PINT LIQUOR DAILY----QUIT 2012  . Drug use: No     Comment: last dose November 2012  . Sexual activity: Yes    Partners: Female     Comment: states no alcohol in 3 weeks   Other Topics Concern  . 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  aspirin EC 81 MG tablet, Take 81 mg by mouth daily. , Disp: , Rfl:  .  buPROPion (WELLBUTRIN XL) 150 MG 24 hr tablet, Take 1 tablet (150 mg total) by mouth daily., Disp: 90 tablet, Rfl: 0 .  diclofenac sodium (VOLTAREN) 1 % GEL, Apply 2 g topically 2 (two) times daily., Disp: 100 g, Rfl: 3 .  furosemide (LASIX) 40 MG tablet, Take 1 tablet (40 mg total) by mouth daily., Disp: 90 tablet, Rfl: 1 .  HYDROcodone-acetaminophen (NORCO) 7.5-325 MG tablet, Take 1 tablet by mouth 2 (two) times daily as needed for moderate pain., Disp: 40 tablet, Rfl: 0 .  hydroxypropyl methylcellulose / hypromellose (ISOPTO TEARS / GONIOVISC) 2.5 % ophthalmic solution, Place 1 drop into both eyes 3 (three) times daily as needed for dry eyes., Disp: , Rfl:  .  Multiple Vitamin (MULTIVITAMIN WITH MINERALS) TABS tablet, Take 1 tablet by mouth daily., Disp: , Rfl:  .  potassium  chloride SA (K-DUR,KLOR-CON) 20 MEQ tablet, Take 1 tablet (20 mEq total) by mouth daily., Disp: 90 tablet, Rfl: 0 .  traZODone (DESYREL) 150 MG tablet, TAKE 1 TABLET BY MOUTH EVERY NIGHT AT BEDTIME, Disp: 90 tablet, Rfl: 3 .  furosemide (LASIX) 40 MG tablet, TAKE 1 TABLET BY MOUTH TWICE DAILY (Patient not taking: Reported on 01/03/2017), Disp: 180 tablet, Rfl: 1  EXAM:  Vitals:   01/03/17 0929  BP: 138/80  Pulse: 77  SpO2: 95%    Body mass index is 59.69  kg/m.  GENERAL: vitals reviewed and listed above, alert, oriented, appears well hydrated and in no acute distress  HEENT: atraumatic, conjunttiva clear, no obvious abnormalities on inspection of external nose and ears  NECK: no obvious masses on inspection  LUNGS: clear to auscultation bilaterally, no wheezes, rales or rhonchi, good air movement  CV: HRRR, no peripheral edema  MS: moves all extremities without noticeable abnormality  PSYCH: pleasant and cooperative, no obvious depression or anxiety  ASSESSMENT AND PLAN:  Discussed the following assessment and plan:  Super obesity  Obesity hypoventilation syndrome (HCC) Hyperglycemia 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, unspecified congestive heart failure chronicity (HCC) -cont current meds -lifestyle recs  OSA and COPD overlap syndrome (HCC) TOBACCO USE -sees pulmonology for lung ca screening  Essential hypertension -cont current meds  Recurrent major depressive disorder, in full remission ([place]) -well controlled  Cirrhosis of liver without ascites, unspecified hepatic cirrhosis type ([place]) -sees specialist for management  Headaches: -offered MRI, he refused and prefers to monitor given mild nature, advised to let us know if changes his mind -Patient advised to return or notify a doctor immediately if symptoms worsen or persist or new concerns arise.  Patient Instructions   BEFORE YOU LEAVE: -schedule flu shot in late sept  or october -follow up: 4-6 months  Please let us know if you decide you wish to go ahead with the MRI of the brain. Follow up sooner if any worsening or change in your symptoms.   Mr. Betsy Pries , Thank you for taking time to come for your Medicare Wellness Visit. I appreciate your ongoing commitment to your health goals. Please review the following plan we discussed and let me know if I can assist you in the future.   Will try to complete AD; Given copy  Referred to [facility] for questions [facility] offers free advance directive forms, as well as assistance in completing the forms themselves. For assistance, contact the Spiritual Care Department at [phone], or the Clinical Social Work Department at [phone].  Deaf & Hard of Hearing Division Services - can assist with hearing aid x 1  No reviews  [employer] Office  [address]  [phone]    These are the goals we discussed: Goals    . patient          Wants to drink more water., Helps him with weight loss        This is a list of the screening recommended for you and due dates:  Health Maintenance  Topic Date Due  . Flu Shot  12/18/2016  . Tetanus Vaccine  06/11/2025  . Colon Cancer Screening  05/07/2026  .  Hepatitis C: One time screening is recommended by Center for Disease Control  (CDC) for  adults born from [birth year] through 1967.   Completed  . Pneumonia vaccines  Completed    Summary: Preventive Care for Adults  A healthy lifestyle and preventive care can promote health and wellness. Preventive health guidelines for adults include the following key practices.  . A routine yearly physical is a good way to check with your health care provider about your health and preventive screening. It is a chance to share any concerns and updates on your health and to receive a thorough exam.  . Visit your dentist for a routine exam and preventive care every 6 months. Brush your teeth twice a day and floss  once a day. Good oral hygiene prevents  tooth decay and gum disease.  . The frequency of eye exams is based on your age, health, family medical history, use  of contact lenses, and other factors. Follow your health care provider's ecommendations for frequency of eye exams.  . Eat a healthy diet. Foods like vegetables, fruits, whole grains, low-fat dairy products, and lean protein foods contain the nutrients you need without too many calories. Decrease your intake of foods high in solid fats, added sugars, and salt. Eat the right amount of calories for you. Get information about a proper diet from your health care provider, if necessary.  . Regular physical exercise is one of the most important things you can do for your health. Most adults should get at least 150 minutes of moderate-intensity exercise (any activity that increases your heart rate and causes you to sweat) each week. In addition, most adults need muscle-strengthening exercises on 2 or more days a week.  Silver Sneakers may be a benefit available to you. To determine eligibility, you may visit the website: www.silversneakers.com or contact program at 304-797-6316 Mon-Fri between 8AM-8PM.   . Maintain a healthy weight. The body mass index (BMI) is a screening tool to identify possible weight problems. It provides an estimate of body fat based on height and weight. Your health care provider can find your BMI and can help you achieve or maintain a healthy weight.   For adults 20 years and older: ? A BMI below 18.5 is considered underweight. ? A BMI of 18.5 to 24.9 is normal. ? A BMI of 27 to 28 is considered normal by the Institutes of Health  ? A BMI of 30 and above is considered obese.   . Maintain normal blood lipids and cholesterol levels by exercising and minimizing your intake of saturated fat. Eat a balanced diet with plenty of fruit and vegetables. Blood tests for lipids and cholesterol should begin at age 86 and be  repeated every 5 years. If your lipid or cholesterol levels are high, you are over 50, or you are at high risk for heart disease, you may need your cholesterol levels checked more frequently. Ongoing high lipid and cholesterol levels should be treated with medicines if diet and exercise are not working.  . If you smoke, find out from your health care provider how to quit. If you do not use tobacco, please do not start.  . If you choose to drink alcohol, please do not consume more than one drink for women and 2 for men.  One drink is considered to be 12 ounces (355 mL) of beer, 5 ounces (148 mL) of wine, or 1.5 ounces (44 mL) of liquor. Moderation of alcohol intake to this level decreases your risk of breast cancer and liver damage.   . If you are 21-71 years old, ask your health care provider if you should take aspirin to prevent strokes.  . Use sunscreen. Apply sunscreen liberally and repeatedly throughout the day. You should seek shade when your shadow is shorter than you. Protect yourself by wearing long sleeves, pants, a wide-brimmed hat, and sunglasses year round, whenever you are outdoors.  . Once a month, do a whole body skin exam, using a mirror to look at the skin on your back. Tell your health care provider of new moles, moles that have irregular borders, moles that are larger than a pencil eraser, or moles that have changed in shape or color.  Last, if you have completed an Advanced Directive; please bring a copy  and review with your physician and then we will scan to the medical record      Fall Prevention in the [place] can cause injuries and can affect people from all age groups. There are many simple things that you can do to make your home safe and to help prevent falls. What can I do on the outside of my home?  Regularly repair the edges of walkways and driveways and fix any cracks.  Remove high doorway thresholds.  Trim any shrubbery on the main path into your  home.  Use bright outdoor lighting.  Clear walkways of debris and clutter, including tools and rocks.  Regularly check that handrails are securely fastened and in good repair. Both sides of any steps should have handrails.  Install guardrails along the edges of any raised decks or porches.  Have leaves, snow, and ice cleared regularly.  Use sand or salt on walkways during winter months.  In the garage, clean up any spills right away, including grease or oil spills. What can I do in the bathroom?  Use night lights.  Install grab bars by the toilet and in the tub and shower. Do not use towel bars as grab bars.  Use non-skid mats or decals on the floor of the tub or shower.  If you need to sit down while you are in the shower, use a plastic, non-slip stool.  Keep the floor dry. Immediately clean up any water that spills on the floor.  Remove soap buildup in the tub or shower on a regular basis.  Attach bath mats securely with double-sided non-slip rug tape.  Remove throw rugs and other tripping hazards from the floor. What can I do in the bedroom?  Use night lights.  Make sure that a bedside light is easy to reach.  Do not use oversized bedding that drapes onto the floor.  Have a firm chair that has side arms to use for getting dressed.  Remove throw rugs and other tripping hazards from the floor. What can I do in the kitchen?  Clean up any spills right away.  Avoid walking on wet floors.  Place frequently used items in easy-to-reach places.  If you need to reach for something above you, use a sturdy step stool that has a grab bar.  Keep electrical cables out of the way.  Do not use floor polish or wax that makes floors slippery. If you have to use wax, make sure that it is non-skid floor wax.  Remove throw rugs and other tripping hazards from the floor. What can I do in the stairways?  Do not leave any items on the stairs.  Make sure that there are  handrails on both sides of the stairs. Fix handrails that are broken or loose. Make sure that handrails are as long as the stairways.  Check any carpeting to make sure that it is firmly attached to the stairs. Fix any carpet that is loose or worn.  Avoid having throw rugs at the top or bottom of stairways, or secure the rugs with carpet tape to prevent them from moving.  Make sure that you have a light switch at the top of the stairs and the bottom of the stairs. If you do not have them, have them installed. What are some other fall prevention tips?  Wear closed-toe shoes that fit well and support your feet. Wear shoes that have rubber soles or low heels.  When you use a stepladder, make sure that  it is completely opened and that the sides are firmly locked. Have someone hold the ladder while you are using it. Do not climb a closed stepladder.  Add color or contrast paint or tape to grab bars and handrails in your home. Place contrasting color strips on the first and last steps.  Use mobility aids as needed, such as canes, walkers, scooters, and crutches.  Turn on lights if it is dark. Replace any light bulbs that burn out.  Set up furniture so that there are clear paths. Keep the furniture in the same spot.  Fix any uneven floor surfaces.  Choose a carpet design that does not hide the edge of steps of a stairway.  Be aware of any and all pets.  Review your medicines with your healthcare provider. Some medicines can cause dizziness or changes in blood pressure, which increase your risk of falling. Talk with your health care provider about other ways that you can decrease your risk of falls. This may include working with a physical therapist or trainer to improve your strength, balance, and endurance. This information is not intended to replace advice given to you by your health care provider. Make sure you discuss any questions you have with your health care provider. Document Released:  04/26/2002 Document Revised: 10/03/2015 Document Reviewed: 06/10/2014 Elsevier Interactive Patient Education  2017 [place] DO

## 2017-01-03 ENCOUNTER — Encounter: Payer: Self-pay | Admitting: Family Medicine

## 2017-01-03 ENCOUNTER — Ambulatory Visit (INDEPENDENT_AMBULATORY_CARE_PROVIDER_SITE_OTHER): Payer: Medicare Other | Admitting: Family Medicine

## 2017-01-03 VITALS — BP 138/80 | HR 77 | Ht 71.0 in | Wt >= 6400 oz

## 2017-01-03 DIAGNOSIS — K7460 Unspecified cirrhosis of liver: Secondary | ICD-10-CM

## 2017-01-03 DIAGNOSIS — R519 Headache, unspecified: Secondary | ICD-10-CM | POA: Insufficient documentation

## 2017-01-03 DIAGNOSIS — R739 Hyperglycemia, unspecified: Secondary | ICD-10-CM | POA: Diagnosis not present

## 2017-01-03 DIAGNOSIS — R51 Headache: Secondary | ICD-10-CM | POA: Diagnosis not present

## 2017-01-03 DIAGNOSIS — I5040 Unspecified combined systolic (congestive) and diastolic (congestive) heart failure: Secondary | ICD-10-CM | POA: Diagnosis not present

## 2017-01-03 DIAGNOSIS — I10 Essential (primary) hypertension: Secondary | ICD-10-CM

## 2017-01-03 DIAGNOSIS — J449 Chronic obstructive pulmonary disease, unspecified: Secondary | ICD-10-CM

## 2017-01-03 DIAGNOSIS — E669 Obesity, unspecified: Secondary | ICD-10-CM

## 2017-01-03 DIAGNOSIS — F17200 Nicotine dependence, unspecified, uncomplicated: Secondary | ICD-10-CM

## 2017-01-03 DIAGNOSIS — F3342 Major depressive disorder, recurrent, in full remission: Secondary | ICD-10-CM | POA: Diagnosis not present

## 2017-01-03 DIAGNOSIS — G4733 Obstructive sleep apnea (adult) (pediatric): Secondary | ICD-10-CM

## 2017-01-03 DIAGNOSIS — E662 Morbid (severe) obesity with alveolar hypoventilation: Secondary | ICD-10-CM

## 2017-01-03 NOTE — Patient Instructions (Addendum)
BEFORE YOU LEAVE: -schedule flu shot in late sept or october -follow up: 4-6 months  Please let us know if you decide you wish to go ahead with the MRI of the brain. Follow up sooner if any worsening or change in your symptoms.   [name] , Thank you for taking time to come for your Medicare Wellness Visit. I appreciate your ongoing commitment to your health goals. Please review the following plan we discussed and let me know if I can assist you in the future.   Will try to complete AD; Given copy  Referred to [facility] for questions [place] offers free advance directive forms, as well as assistance in completing the forms themselves. For assistance, contact the Spiritual Care Department at [phone], or the Clinical Social Work Department at [phone].  Deaf & Hard of Hearing Division Services - can assist with hearing aid x 1  No reviews  [employer] Office  [address]  [phone]    These are the goals we discussed: Goals    . patient          Wants to drink more water., Helps him with weight loss        This is a list of the screening recommended for you and due dates:  Health Maintenance  Topic Date Due  . Flu Shot  12/18/2016  . Tetanus Vaccine  06/11/2025  . Colon Cancer Screening  05/07/2026  .  Hepatitis C: One time screening is recommended by Center for Disease Control  (CDC) for  adults born from [birth year] through 1967.   Completed  . Pneumonia vaccines  Completed    Summary: Preventive Care for Adults  A healthy lifestyle and preventive care can promote health and wellness. Preventive health guidelines for adults include the following key practices.  . A routine yearly physical is a good way to check with your health care provider about your health and preventive screening. It is a chance to share any concerns and updates on your health and to receive a thorough exam.  . Visit your dentist for a routine exam and preventive care every  6 months. Brush your teeth twice a day and floss once a day. Good oral hygiene prevents tooth decay and gum disease.  . The frequency of eye exams is based on your age, health, family medical history, use  of contact lenses, and other factors. Follow your health care provider's ecommendations for frequency of eye exams.  . Eat a healthy diet. Foods like vegetables, fruits, whole grains, low-fat dairy products, and lean protein foods contain the nutrients you need without too many calories. Decrease your intake of foods high in solid fats, added sugars, and salt. Eat the right amount of calories for you. Get information about a proper diet from your health care provider, if necessary.  . Regular physical exercise is one of the most important things you can do for your health. Most adults should get at least 150 minutes of moderate-intensity exercise (any activity that increases your heart rate and causes you to sweat) each week. In addition, most adults need muscle-strengthening exercises on 2 or more days a week.  Silver Sneakers may be a benefit available to you. To determine eligibility, you may visit the website: www.silversneakers.com or contact program at (743)098-3920 Mon-Fri between 8AM-8PM.   . Maintain a healthy weight. The body mass index (BMI) is a screening tool to identify possible weight problems. It provides an estimate of  body fat based on height and weight. Your health care provider can find your BMI and can help you achieve or maintain a healthy weight.   For adults 20 years and older: ? A BMI below 18.5 is considered underweight. ? A BMI of 18.5 to 24.9 is normal. ? A BMI of 27 to 28 is considered normal by the Institutes of Health  ? A BMI of 30 and above is considered obese.   . Maintain normal blood lipids and cholesterol levels by exercising and minimizing your intake of saturated fat. Eat a balanced diet with plenty of fruit and vegetables. Blood tests for lipids  and cholesterol should begin at age 5 and be repeated every 5 years. If your lipid or cholesterol levels are high, you are over 50, or you are at high risk for heart disease, you may need your cholesterol levels checked more frequently. Ongoing high lipid and cholesterol levels should be treated with medicines if diet and exercise are not working.  . If you smoke, find out from your health care provider how to quit. If you do not use tobacco, please do not start.  . If you choose to drink alcohol, please do not consume more than one drink for women and 2 for men.  One drink is considered to be 12 ounces (355 mL) of beer, 5 ounces (148 mL) of wine, or 1.5 ounces (44 mL) of liquor. Moderation of alcohol intake to this level decreases your risk of breast cancer and liver damage.   . If you are 33-70 years old, ask your health care provider if you should take aspirin to prevent strokes.  . Use sunscreen. Apply sunscreen liberally and repeatedly throughout the day. You should seek shade when your shadow is shorter than you. Protect yourself by wearing long sleeves, pants, a wide-brimmed hat, and sunglasses year round, whenever you are outdoors.  . Once a month, do a whole body skin exam, using a mirror to look at the skin on your back. Tell your health care provider of new moles, moles that have irregular borders, moles that are larger than a pencil eraser, or moles that have changed in shape or color.  Last, if you have completed an Advanced Directive; please bring a copy and review with your physician and then we will scan to the medical record      Fall Prevention in the [place] can cause injuries and can affect people from all age groups. There are many simple things that you can do to make your home safe and to help prevent falls. What can I do on the outside of my home?  Regularly repair the edges of walkways and driveways and fix any cracks.  Remove high doorway  thresholds.  Trim any shrubbery on the main path into your home.  Use bright outdoor lighting.  Clear walkways of debris and clutter, including tools and rocks.  Regularly check that handrails are securely fastened and in good repair. Both sides of any steps should have handrails.  Install guardrails along the edges of any raised decks or porches.  Have leaves, snow, and ice cleared regularly.  Use sand or salt on walkways during winter months.  In the garage, clean up any spills right away, including grease or oil spills. What can I do in the bathroom?  Use night lights.  Install grab bars by the toilet and in the tub and shower. Do not use towel bars as grab bars.  Use non-skid mats  or decals on the floor of the tub or shower.  If you need to sit down while you are in the shower, use a plastic, non-slip stool.  Keep the floor dry. Immediately clean up any water that spills on the floor.  Remove soap buildup in the tub or shower on a regular basis.  Attach bath mats securely with double-sided non-slip rug tape.  Remove throw rugs and other tripping hazards from the floor. What can I do in the bedroom?  Use night lights.  Make sure that a bedside light is easy to reach.  Do not use oversized bedding that drapes onto the floor.  Have a firm chair that has side arms to use for getting dressed.  Remove throw rugs and other tripping hazards from the floor. What can I do in the kitchen?  Clean up any spills right away.  Avoid walking on wet floors.  Place frequently used items in easy-to-reach places.  If you need to reach for something above you, use a sturdy step stool that has a grab bar.  Keep electrical cables out of the way.  Do not use floor polish or wax that makes floors slippery. If you have to use wax, make sure that it is non-skid floor wax.  Remove throw rugs and other tripping hazards from the floor. What can I do in the stairways?  Do not leave  any items on the stairs.  Make sure that there are handrails on both sides of the stairs. Fix handrails that are broken or loose. Make sure that handrails are as long as the stairways.  Check any carpeting to make sure that it is firmly attached to the stairs. Fix any carpet that is loose or worn.  Avoid having throw rugs at the top or bottom of stairways, or secure the rugs with carpet tape to prevent them from moving.  Make sure that you have a light switch at the top of the stairs and the bottom of the stairs. If you do not have them, have them installed. What are some other fall prevention tips?  Wear closed-toe shoes that fit well and support your feet. Wear shoes that have rubber soles or low heels.  When you use a stepladder, make sure that it is completely opened and that the sides are firmly locked. Have someone hold the ladder while you are using it. Do not climb a closed stepladder.  Add color or contrast paint or tape to grab bars and handrails in your home. Place contrasting color strips on the first and last steps.  Use mobility aids as needed, such as canes, walkers, scooters, and crutches.  Turn on lights if it is dark. Replace any light bulbs that burn out.  Set up furniture so that there are clear paths. Keep the furniture in the same spot.  Fix any uneven floor surfaces.  Choose a carpet design that does not hide the edge of steps of a stairway.  Be aware of any and all pets.  Review your medicines with your healthcare provider. Some medicines can cause dizziness or changes in blood pressure, which increase your risk of falling. Talk with your health care provider about other ways that you can decrease your risk of falls. This may include working with a physical therapist or trainer to improve your strength, balance, and endurance. This information is not intended to replace advice given to you by your health care provider. Make sure you discuss any questions you  have with  your health care provider. Document Released: 04/26/2002 Document Revised: 10/03/2015 Document Reviewed: 06/10/2014 Elsevier Interactive Patient Education  2017 [employer].

## 2017-01-03 NOTE — Progress Notes (Signed)
Subjective:   Derrick Mata is a 66 y.o. male who presents for Medicare Annual/Subsequent preventive examination.  The Patient was informed that the wellness visit is to identify future health risk and educate and initiate measures that can reduce risk for increased disease through the lifespan.    Annual Wellness Assessment  Reports health as great now  Weight is 421 normally at home   Married x 40 years Lives one level home Bathroom is walk in shower  Preventive Screening -Counseling & Management  Medicare Annual Preventive Care Visit - Subsequent Last OV today  Has LDCT every year to check for lung cancer Has had CT of the abd   VS reviewed;   Diet  Eggs x 5 days a week Sometimes a cheese sandwich Eats in the am and midday and before 8pm Has big meal on Sundays Wife works and cooks on the weekend  Does not use salt  BMI 59 Was 553 lbs in 2003. Gained weight when he went off ETOH  Started back on alkaline water    Exercise daily routine  No exercise due to bad knees and back issues    Stressors:  Does not worry  Sleep patterns: take med; sleeps at least 4 hours; bathroom and then sleeps periodically because he does not have a schedule  Pain; doesn't have pain unless he walks to much with his knees     Cardiac Risk Factors Addressed Hyperlipidemia wnl Diabetes - neg  Obesity is trying to eat well   Advanced Directives wants to get with his wife but she is hesitant   Patient Care Team: Lucretia Kern, DO as PCP - General (Family Medicine) Comer, Okey Regal, MD as Consulting Physician (Infectious Diseases) Monna Fam, MD as Consulting Physician (Ophthalmology) Assessed for additional providers  Immunization History  Administered Date(s) Administered  . Hepatitis A, Adult 12/19/2015, 10/03/2016  . Hepatitis B, adult 08/21/2015, 04/04/2016  . Hepatitis B, ped/adol 07/19/2015  . Influenza Split 03/28/2011  . Influenza Whole 06/23/2007,  02/15/2009, 01/31/2010  . Influenza, High Dose Seasonal PF 02/01/2016  . Influenza,inj,Quad PF,36+ Mos 01/12/2013, 02/25/2014  . Influenza-Unspecified 01/19/2015  . Pneumococcal Conjugate-13 09/08/2014  . Pneumococcal Polysaccharide-23 07/17/2015  . Zoster 02/08/2012   Required Immunizations needed today  Screening test up to date or reviewed for plan of completion Health Maintenance Due  Topic Date Due  . INFLUENZA VACCINE  12/18/2016     Will take  A flu vaccine when available   Cardiac Risk Factors include: advanced age (>5men, >62 women);family history of premature cardiovascular disease;male gender;obesity (BMI >30kg/m2);sedentary lifestyle;smoking/ tobacco exposure     Objective:    Vitals: BP 138/80   Pulse 77   Ht 5\' 11"  (1.803 m)   Wt (!) 428 lb (194.1 kg)   SpO2 95%   BMI 59.69 kg/m   Body mass index is 59.69 kg/m.  Tobacco History  Smoking Status  . Current Every Day Smoker  . Packs/day: 1.00  . Years: 51.00  . Types: Cigarettes  . Start date: 05/21/1963  Smokeless Tobacco  . Never Used    Comment: not ready to quit yet     Ready to quit: No Counseling given: Yes   Past Medical History:  Diagnosis Date  . Arthritis    OA of knees  . CHF (congestive heart failure) ([place])   . Colon polyp   . Complication of anesthesia    slow to wake up  . COPD (chronic obstructive pulmonary disease) (  [place])    > 30 pack yr smoker  . Depression    Anxiety and Insomnia  . Diverticulosis   . Erectile dysfunction   . GERD (gastroesophageal reflux disease)   . Hemorrhoids   . Hep C w/o coma, chronic ([place]) 06/12/2015   treated with medications  . Leg edema   . Morbid obesity ([place])   . Sleep apnea    refuses CPAP  . Sleep apnea   . 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 COLONOSCOPY  04/18/2011   Procedure: COLONOSCOPY;  Surgeon: Scarlette Shorts, MD;  Location: [facility] END[facility]COPY;  Service: Endoscopy;  Laterality: N/A;  . COLONOSCOPY WITH PROPOFOL N/A 05/07/2016   Procedure: COLONOSCOPY WITH PROPOFOL;  Surgeon: Irene Shipper, MD;  Location: [facility] END[facility]COPY;  Service: Endoscopy;  Laterality: N/A;  . ESOPHAGOGASTRODUODENOSCOPY (EGD) WITH PROPOFOL N/A 05/07/2016   Procedure: ESOPHAGOGASTRODUODENOSCOPY (EGD) WITH PROPOFOL;  Surgeon: Irene Shipper, MD;  Location: [facility] END[facility];  Service: Endoscopy;  Laterality: N/A;  . KNEE ARTHROSCOPY  2011   left  . [place], 2010  . TIBIA FRACTURE SURGERY  2006   hardware  . TONSILLECTOMY  1963   Family History  Problem Relation Age of Onset  . Colon cancer Sister   . Cancer Sister 65       sister, colon   . Breast cancer Mother   . Cancer Mother        breast  . Heart disease Other   . Heart disease Father 63  . Stomach cancer Neg Hx   . Rectal cancer Neg Hx    History  Sexual Activity  . Sexual activity: Yes  . Partners: Female    Comment: states no alcohol in 3 weeks    Outpatient Encounter Prescriptions as of 01/03/2017  Medication Sig  . aspirin EC 81 MG tablet Take 81 mg by mouth daily.   Marland Kitchen buPROPion (WELLBUTRIN XL) 150 MG 24 hr tablet Take 1 tablet (150 mg total) by mouth daily.  . diclofenac sodium (VOLTAREN) 1 % GEL Apply 2 g topically 2 (two) times daily.  . furosemide (LASIX) 40 MG tablet Take 1 tablet (40 mg total) by mouth daily.  Marland Kitchen HYDROcodone-acetaminophen (NORCO) 7.5-325 MG tablet Take 1 tablet by mouth 2 (two) times daily as needed for moderate pain.  . hydroxypropyl methylcellulose / hypromellose (ISOPTO TEARS / GONIOVISC) 2.5 % ophthalmic solution Place 1 drop into both eyes 3 (three) times daily as needed for dry eyes.  . meloxicam (MOBIC) 15 MG tablet Take 15 mg by mouth daily as needed for pain.  . Multiple Vitamin (MULTIVITAMIN WITH MINERALS) TABS tablet Take 1 tablet by mouth daily.  . potassium chloride SA (K-DUR,KLOR-CON) 20 MEQ tablet Take 1 tablet  (20 mEq total) by mouth daily.  . traZODone (DESYREL) 150 MG tablet TAKE 1 TABLET BY MOUTH EVERY NIGHT AT BEDTIME  . furosemide (LASIX) 40 MG tablet TAKE 1 TABLET BY MOUTH TWICE DAILY (Patient not taking: Reported on 01/03/2017)  . methocarbamol (ROBAXIN) 500 MG tablet Take 1 tablet (500 mg total) by mouth 2 (two) times daily. (Patient not taking: Reported on 01/03/2017)  . oxyCODONE-acetaminophen (PERCOCET/ROXICET) 5-325 MG tablet Take 1-2 tablets by mouth every 6 (six) hours as needed for severe pain. (Patient not taking: Reported on 01/03/2017)   No facility-administered encounter medications on file as of 01/03/2017.  Activities of Daily Living In your present state of health, do you have any difficulty performing the following activities: 01/03/2017 04/04/2016  Hearing? N N  Vision? N N  Difficulty concentrating or making decisions? N N  Walking or climbing stairs? Y N  Dressing or bathing? N N  Doing errands, shopping? N N  Preparing Food and eating ? N -  Using the Toilet? N -  In the past six months, have you accidently leaked urine? N -  Do you have problems with loss of bowel control? N -  Managing your Medications? N -  Managing your Finances? N -  Housekeeping or managing your Housekeeping? N -  Some recent data might be hidden    Patient Care Team: Lucretia Kern, DO as PCP - General (Family Medicine) Comer, Okey Regal, MD as Consulting Physician (Infectious Diseases) Monna Fam, MD as Consulting Physician (Ophthalmology)   Assessment:     Exercise Activities and Dietary recommendations Current Exercise Habits: Home exercise routine, Time (Minutes): 30, Intensity: Mild  Goals    None     Fall Risk Fall Risk  01/03/2017 04/04/2016 09/15/2015 07/19/2015 08/25/2014  Falls in the past year? No Yes Yes No No  Number falls in past yr: - 1 1 - -  Injury with Fall? - No No - -  Risk for fall due to : - History of fall(s);Impaired balance/gait - - -  Follow up - Falls  evaluation completed;Education provided - - -   Depression Screen PHQ 2/9 Scores 01/03/2017 09/15/2015 07/19/2015 08/25/2014  PHQ - 2 Score 0 0 0 0    Cognitive Function Ad8 score reviewed for issues:  Issues making decisions:  Less interest in hobbies / activities:  Repeats questions, stories (family complaining):  Trouble using ordinary gadgets (microwave, computer, phone):  Forgets the month or year:   Mismanaging finances:   Remembering appts:  Daily problems with thinking and/or memory: Ad8 score is=0         6CIT Screen 01/03/2017  What Year? 0 points  What month? 0 points  What time? 0 points  Count back from 20 0 points  Months in reverse 0 points  Repeat phrase 0 points  Total Score 0    Immunization History  Administered Date(s) Administered  . Hepatitis A, Adult 12/19/2015, 10/03/2016  . Hepatitis B, adult 08/21/2015, 04/04/2016  . Hepatitis B, ped/adol 07/19/2015  . Influenza Split 03/28/2011  . Influenza Whole 06/23/2007, 02/15/2009, 01/31/2010  . Influenza, High Dose Seasonal PF 02/01/2016  . Influenza,inj,Quad PF,36+ Mos 01/12/2013, 02/25/2014  . Influenza-Unspecified 01/19/2015  . Pneumococcal Conjugate-13 09/08/2014  . Pneumococcal Polysaccharide-23 07/17/2015  . Zoster 02/08/2012   Screening Tests Health Maintenance  Topic Date Due  . INFLUENZA VACCINE  12/18/2016  . TETANUS/TDAP  06/11/2025  . COLONOSCOPY  05/07/2026  . Hepatitis C Screening  Completed  . PNA vac Low Risk Adult  Completed      Plan:     PCP Notes   Health Maintenance Will take his flu vaccine when available  Colonoscopy not due until 05/2015; 05/2015 PSA in 08/22/2014    Abnormal Screens  Obese but has lost weight  Referrals  No   Patient concerns; Family of sarcoidosis and wants to know if he can be checked  Nurse Concerns; none  Next PCP apt Today       I have personally reviewed and noted the following in the patient's chart:   . Medical and  social history . Use of  alcohol, tobacco or illicit drugs  . Current medications and supplements . Functional ability and status . Nutritional status . Physical activity . Advanced directives . List of other physicians . Hospitalizations, surgeries, and ER visits in previous 12 months . Vitals . Screenings to include cognitive, depression, and falls . Referrals and appointments  In addition, I have reviewed and discussed with patient certain preventive protocols, quality metrics, and best practice recommendations. A written personalized care plan for preventive services as well as general preventive health recommendations were provided to patient.     Wynetta Fines, RN  01/03/2017

## 2017-02-06 ENCOUNTER — Encounter: Payer: Self-pay | Admitting: Family Medicine

## 2017-03-04 ENCOUNTER — Other Ambulatory Visit: Payer: Self-pay | Admitting: Family Medicine

## 2017-03-09 ENCOUNTER — Other Ambulatory Visit: Payer: Self-pay | Admitting: Family Medicine

## 2017-04-21 IMAGING — CT CT CHEST LUNG CANCER SCREENING LOW DOSE W/O CM
1 of 2 series · 10 of 20 positions shown, 13 images · non-contrast
Comparison: 06/19/2015 screening chest CT.

CLINICAL DATA: 65-year-old asymptomatic male current smoker with 51
pack-year smoking history.

EXAM:
CT CHEST WITHOUT CONTRAST LOW-DOSE FOR LUNG CANCER SCREENING
TECHNIQUE: Multidetector CT imaging of the chest was performed following the
standard protocol without IV contrast.

[ct lung segmentation data · axial · 0.81mm/px · z∈[-141,-141]mm · 10 of 332 frames shown]
[frame 1/332  mediastinal]
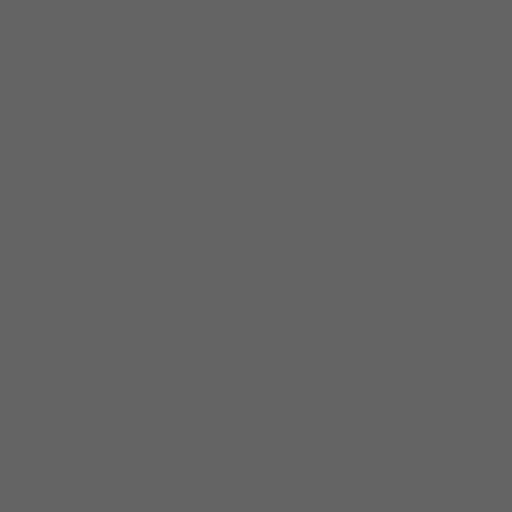
[frame 1/332  lung]
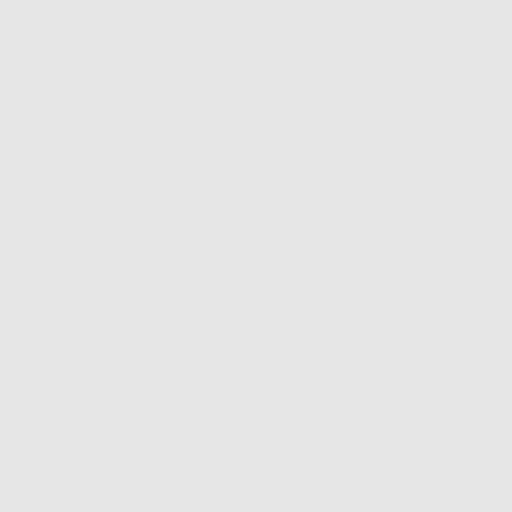
[frame 37/332  lung]
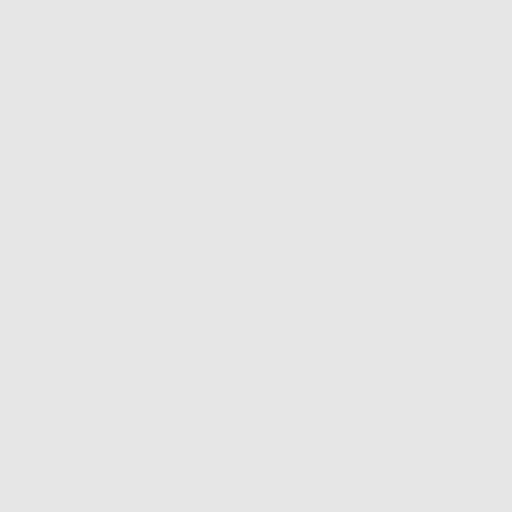
[frame 74/332  lung]
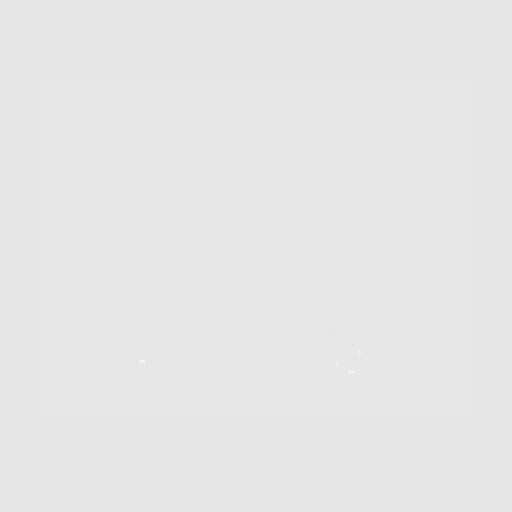
[frame 111/332  lung]
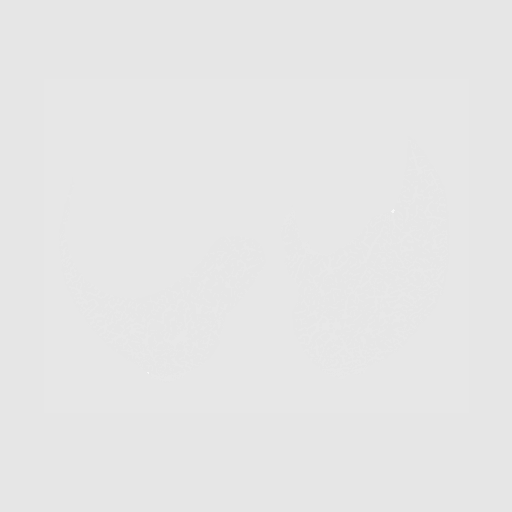
[frame 148/332  mediastinal]
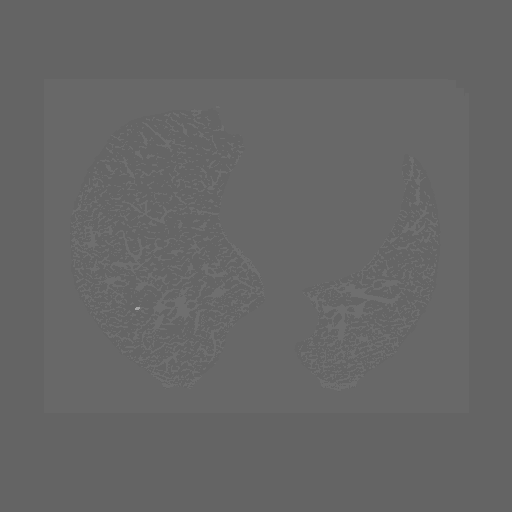
[frame 148/332  lung]
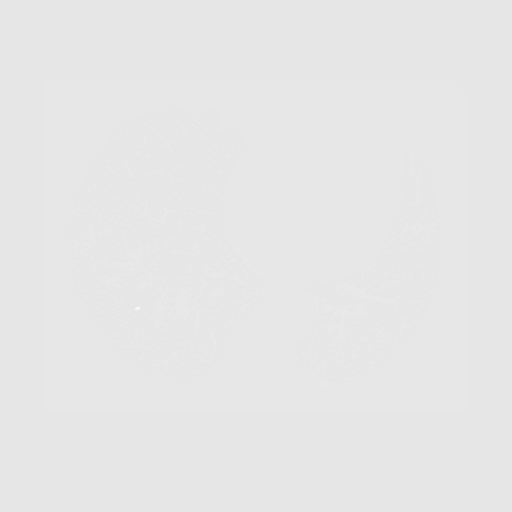
[frame 184/332  lung]
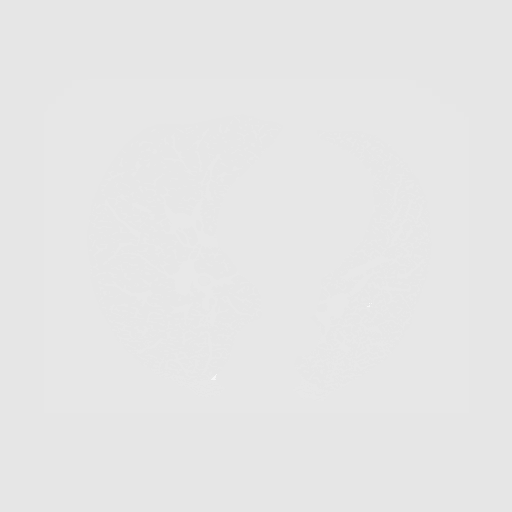
[frame 221/332  lung]
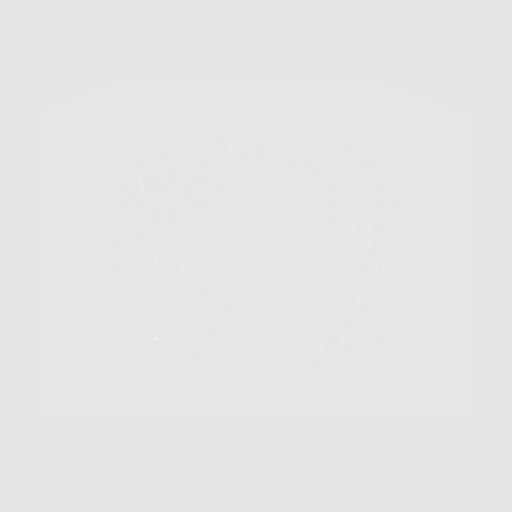
[frame 258/332  lung]
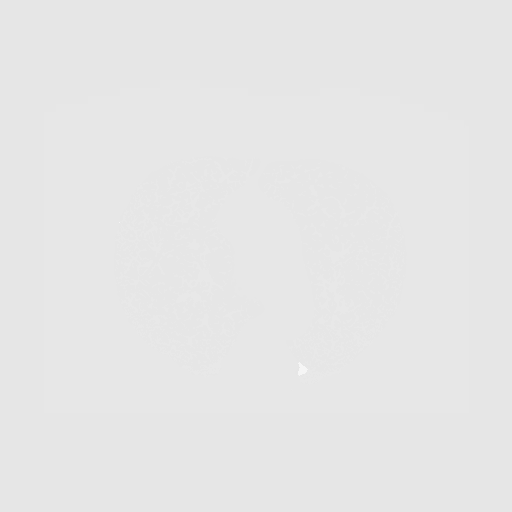
[frame 295/332  mediastinal]
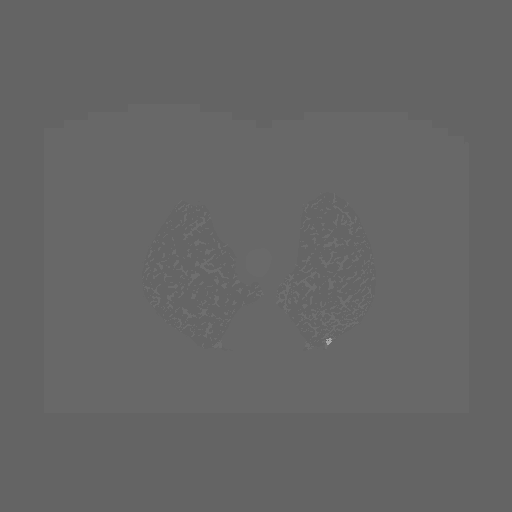
[frame 295/332  lung]
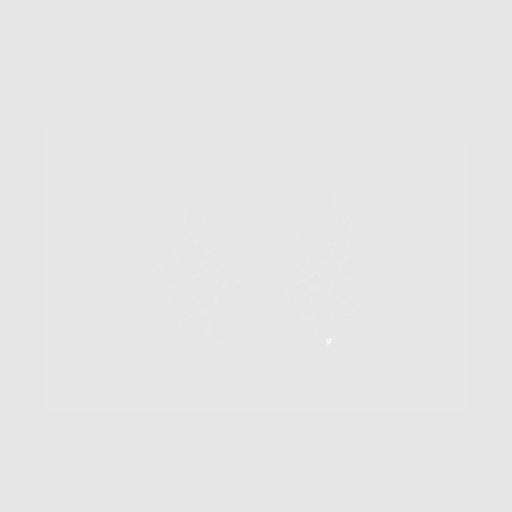
[frame 332/332  lung]
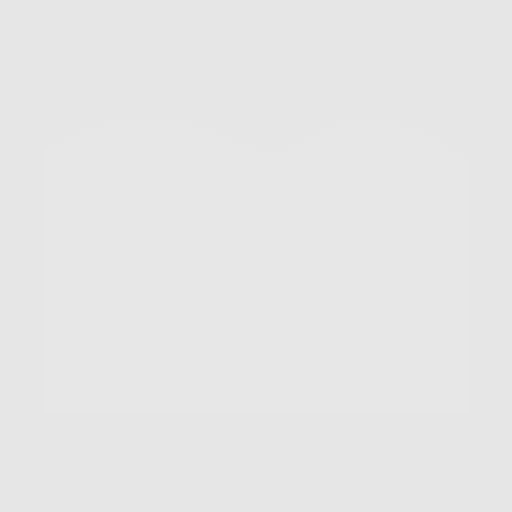

[10 of 20 positions shown; findings below may reference images not displayed]

FINDINGS: Cardiovascular: Normal heart size. No significant pericardial
fluid/thickening. Great vessels are normal in course and caliber.

Mediastinum/Nodes: No discrete thyroid nodules. Unremarkable
esophagus. No pathologically enlarged axillary, mediastinal or gross
hilar lymph nodes, noting limited sensitivity for the detection of
hilar adenopathy on this noncontrast study.

Lungs/Pleura: No pneumothorax. No pleural effusion. Mild
centrilobular and paraseptal emphysema. Several of the previously
described pulmonary nodules are absent on the current scan. None of
the persistent pulmonary nodules have significantly grown, with the
largest measuring 8.1 mm in volume derived mean diameter in the
lingula (series 3/ image 179). No new significant pulmonary nodules.

Upper abdomen: Unremarkable.

Musculoskeletal: No aggressive appearing focal osseous lesions. Mild
thoracic spondylosis. Symmetric mild gynecomastia.
IMPRESSION: 1. Lung-RADS Category 2, benign appearance or behavior. Continue
annual screening with low-dose chest CT without contrast in 12
months.
2. Mild emphysema.

## 2017-05-23 ENCOUNTER — Encounter: Payer: Self-pay | Admitting: Internal Medicine

## 2017-05-26 ENCOUNTER — Other Ambulatory Visit: Payer: Self-pay | Admitting: Family Medicine

## 2017-05-27 NOTE — Telephone Encounter (Signed)
Needs appt. Ok to refill 30 days in interim.

## 2017-05-29 ENCOUNTER — Encounter: Payer: Self-pay | Admitting: Family Medicine

## 2017-05-29 NOTE — Telephone Encounter (Signed)
I called the pt and left a detailed message stating the Rx was sent in for a 30-day supply as he has an appt on 2/18.

## 2017-06-25 ENCOUNTER — Ambulatory Visit (INDEPENDENT_AMBULATORY_CARE_PROVIDER_SITE_OTHER)
Admission: RE | Admit: 2017-06-25 | Discharge: 2017-06-25 | Disposition: A | Discharge status: Home / Self Care | Payer: Medicare Other | Source: Ambulatory Visit | Attending: Acute Care | Admitting: Acute Care

## 2017-06-25 DIAGNOSIS — F17210 Nicotine dependence, cigarettes, uncomplicated: Secondary | ICD-10-CM | POA: Diagnosis not present

## 2017-06-30 ENCOUNTER — Other Ambulatory Visit: Payer: Self-pay | Admitting: Acute Care

## 2017-06-30 DIAGNOSIS — Z122 Encounter for screening for malignant neoplasm of respiratory organs: Secondary | ICD-10-CM

## 2017-06-30 DIAGNOSIS — F17210 Nicotine dependence, cigarettes, uncomplicated: Secondary | ICD-10-CM

## 2017-07-02 ENCOUNTER — Encounter: Payer: Self-pay | Admitting: Family Medicine

## 2017-07-02 DIAGNOSIS — I700 Atherosclerosis of aorta: Secondary | ICD-10-CM | POA: Insufficient documentation

## 2017-07-03 ENCOUNTER — Ambulatory Visit: Payer: Medicare Other | Admitting: Internal Medicine

## 2017-07-03 ENCOUNTER — Other Ambulatory Visit: Payer: Self-pay | Admitting: Family Medicine

## 2017-07-03 ENCOUNTER — Encounter: Payer: Self-pay | Admitting: Internal Medicine

## 2017-07-03 ENCOUNTER — Other Ambulatory Visit: Payer: Self-pay | Admitting: Internal Medicine

## 2017-07-03 VITALS — BP 130/80 | HR 74 | Ht 71.0 in | Wt >= 6400 oz

## 2017-07-03 DIAGNOSIS — Z8719 Personal history of other diseases of the digestive system: Secondary | ICD-10-CM

## 2017-07-03 DIAGNOSIS — Z86010 Personal history of colonic polyps: Secondary | ICD-10-CM | POA: Diagnosis not present

## 2017-07-03 MED ORDER — NA SULFATE-K SULFATE-MG SULF 17.5-3.13-1.6 GM/177ML PO SOLN: 1.0000 | Freq: Once | ORAL | 0 refills | Status: AC

## 2017-07-03 NOTE — Progress Notes (Signed)
HISTORY OF PRESENT ILLNESS:  Derrick Mata is a 67 y.o. 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 Patient presents today for follow-up regarding the need for surveillance colonoscopy and management of his chronic liver disease. He was last evaluated in the office 02/19/2016. See that dictation for details. He subsequently underwent upper endoscopy. No varices. His imaging study from last made not show any hepatic mass. Surveillance colonoscopy revealed 15 polyps which were removed and found to be adenomatous. Follow-up in one year medically fit recommended. The patient tells me that he has not been hospitalized and has been medically stable. Despite his massive obesity he has been slowly losing weight. He continues to smoke  REVIEW OF SYSTEMS:  All non-GI ROS negative unless otherwise stated in the history of present illness except for anxiety, arthritis, cough  Past Medical History:  Diagnosis Date  . Arthritis    OA of knees  . CHF (congestive heart failure) ([place])   . Colon polyp   . Complication of anesthesia    slow to wake up  . COPD (chronic obstructive pulmonary disease) (HCC)    > 30 pack yr smoker  . Depression    Anxiety and Insomnia  . Diverticulosis   . Erectile dysfunction   . GERD (gastroesophageal reflux disease)   . Hemorrhoids   . Hep C w/o coma, chronic ([place]) 06/12/2015   treated with medications  . Leg edema   . Morbid obesity ([place])   . Sleep apnea    refuses CPAP  . Sleep apnea   . 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 COLONOSCOPY  04/18/2011   Procedure: COLONOSCOPY;  Surgeon: Scarlette Shorts, MD;  Location: [facility];  Service: Endoscopy;  Laterality: N/A;  . COLONOSCOPY WITH PROPOFOL N/A 05/07/2016   Procedure:  COLONOSCOPY WITH PROPOFOL;  Surgeon: Irene Shipper, MD;  Location: [facility];  Service: Endoscopy;  Laterality: N/A;  . ESOPHAGOGASTRODUODENOSCOPY (EGD) WITH PROPOFOL N/A 05/07/2016   Procedure: ESOPHAGOGASTRODUODENOSCOPY (EGD) WITH PROPOFOL;  Surgeon: Irene Shipper, MD;  [facility]: [facility];  Service: Endoscopy;  Laterality: N/A;  . KNEE ARTHROSCOPY  2011   left  . [place], 2010  . TIBIA FRACTURE SURGERY  2006   hardware  . TONSILLECTOMY  1963    Social History Derrick Mata  reports that he has been smoking cigarettes.  He started smoking about 54 years ago. He has a 51.00 pack-year smoking history. he has never used smokeless tobacco. He reports that he does not drink alcohol or use drugs.  family history includes Breast cancer in his mother; Cancer in his mother; Cancer (age of onset: 65) in his sister; Colon cancer in his sister; Heart disease in his other; Heart disease (age of onset: 40) in his father.  Allergies  Allergen Reactions  . Penicillins Anaphylaxis    REACTION: nausea Passed out.  Has patient had a PCN reaction causing immediate rash, facial/tongue/throat swelling, SOB or lightheadedness with hypotension: {no Has patient had a PCN reaction causing severe rash involving mucus membranes or skin necrosis: {no Has patient had a PCN reaction that required hospitalization {no Has patient had a PCN reaction occurring within the last 10 years: {no If all of the above answers are "NO",  then may proceed with Cephalosporin use.  . Sulfamethoxazole     REACTION: urticaria (hives)       PHYSICAL EXAMINATION: Vital signs: BP 130/80   Pulse 74   Ht 5' 11"  (1.803 m)   Wt (!) 415 lb (188.2 kg)   BMI 57.88 kg/m   Constitutional: Pleasant, massively obese but generally well-appearing, no acute distress Psychiatric: alert and oriented x3, cooperative Eyes: extraocular movements intact, anicteric, conjunctiva pink Mouth: oral pharynx moist, no  lesions Neck: supple no lymphadenopathy Cardiovascular: heart regular rate and rhythm, no murmur Lungs: clear to auscultation bilaterally Abdomen: soft, obese, nontender, nondistended, no obvious ascites, no peritoneal signs, normal bowel sounds, no organomegaly Rectal: Deferred until colonoscopy Extremities: no clubbing or cyanosis. 1+ lower extremity edema bilaterally Skin: no lesions on visible extremities Neuro: No focal deficits. Cranial nerves intact. No asterixis.   ASSESSMENT:  #1. History of multiple adenomatous colon polyps. 15 adenomas December 2017. Due for follow-up surveillance. Appropriate candidate without strict contraindication #2. History of hepatitis C status post Harvoni therapy (successful) with question of cirrhosis on elastography #3. Morbid obesity  PLAN:  #1. Liver ultrasound to screen for [place] #2. Surveillance colonoscopy. The patient is HIGH RISK given his body habitus. As such, the examination will need to be scheduled at the hospital with MAC as previous.The nature of the procedure, as well as the risks, benefits, and alternatives were carefully and thoroughly reviewed with the patient. Ample time for discussion and questions allowed. The patient understood, was satisfied, and agreed to proceed. #4. Weight loss. Advised #5. Stop smoking. Advised #6. Ongoing general medical care with PCP and monitoring of hepatitis C per his ID specialist

## 2017-07-03 NOTE — Patient Instructions (Signed)
You have been scheduled for an abdominal ultrasound at [facility] Radiology (1st floor of hospital) on 07/09/2017 at 7:00am. Please arrive 15 minutes prior to your appointment for registration. Make certain not to have anything to eat or drink after midnight prior to your appointment. Should you need to reschedule your appointment, please contact radiology at [phone]. This test typically takes about 30 minutes to perform.  You have been scheduled for a colonoscopy. Please follow written instructions given to you at your visit today.  Please pick up your prep supplies at the pharmacy within the next 1-3 days. If you use inhalers (even only as needed), please bring them with you on the day of your procedure. Your physician has requested that you go to www.[email or web address].com and enter the access code given to you at your visit today. This web site gives a general overview about your procedure. However, you should still follow specific instructions given to you by our office regarding your preparation for the procedure.

## 2017-07-05 NOTE — Progress Notes (Signed)
HPI:  Derrick Mata is a pleasant 67 y.o. here for follow up. Chronic medical problems summarized below were reviewed for changes and stability and were updated as needed below. These issues and their treatment remain stable for the most part.  Reports he is doing well.  No new complaints today.  Reports mood is good.  He prefers 90-day refills on his medications.  He is trying to eat healthier. Denies CP, SOB, DOE, treatment intolerance or new symptoms. Due for fasting lipids. He had his flu shot.  AWV 01/03/2017  Morbid Obesity/CdCHF/Dependant Edema/Hyperglycemia/Aortic atherosclerosis: -reports has lost >100lbs in the past- used to weigh >500lbs -drinks soda and bread is his weakness -no regular exercise, reports bad OA of the knees -eval with cardiology in 2014 -meds:asa, lasix 40mg  bid, potassium chloride -has OA of knees  Tobacco Use/COPD/emphysemaOSA: -1ppd for > 30 years -denies: SOB, DOE -reports not interested in quitting, aware of risks -he did lung cancer screening with [facility] Form 06/2017 -referred to pulmonology by prior PCP -referred to sleep specialist, refused to wear CPAP  Hx of Depression, Anxiety, hx substance abuse and Insomnia/OSA: -meds: wellbutrin 150mg  daily and trazadone 150 mg nightly started by prior PCP -reports depression and anxiety well controlled for many years -reported at initial visit, last bout with drugs and alcohol was in 2012 - when lost relative -used cocaine early in life, in [facility] x2 remotely  GERD:  -resolved  ED: -on viagra; startedwith prior PCP  Hepatitis C/Cirrhosis: -noncompliant history -finally completed Harvoni in 2017 -managed in hepatology clinic  OA: -referred to ortho in the past -uses diclofenac topical - advised of risks/interactions, lidocaine top  ROS: See pertinent positives and negatives per HPI.  Past Medical History:  Diagnosis Date  . Arthritis    OA of knees  . CHF (congestive  heart failure) ([place])   . Closed compression fracture of L1 lumbar vertebra ([place]) 09/03/2016  . Colon polyp   . Complication of anesthesia    slow to wake up  . COPD (chronic obstructive pulmonary disease) (HCC)    > 30 pack yr smoker  . Depression    Anxiety and Insomnia  . Diverticulosis   . Erectile dysfunction   . GERD (gastroesophageal reflux disease)   . Hemorrhoids   . Hep C w/o coma, chronic ([place]) 06/12/2015   treated with medications  . Leg edema   . Morbid obesity ([place])   . Sleep apnea    refuses CPAP  . Sleep apnea   . 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 COLONOSCOPY  04/18/2011   Procedure: COLONOSCOPY;  Surgeon: Scarlette Shorts, MD;  [facility];  Service: Endoscopy;  Laterality: N/A;  . COLONOSCOPY WITH PROPOFOL N/A 05/07/2016   Procedure: COLONOSCOPY WITH PROPOFOL;  Surgeon: Irene Shipper, MD;  [facility];  Service: Endoscopy;  Laterality: N/A;  . ESOPHAGOGASTRODUODENOSCOPY (EGD) WITH PROPOFOL N/A 05/07/2016   Procedure: ESOPHAGOGASTRODUODENOSCOPY (EGD) WITH PROPOFOL;  Surgeon: Irene Shipper, MD;  [facility];  Service: Endoscopy;  Laterality: N/A;  . KNEE ARTHROSCOPY  2011   left  . [place], 2010  . TIBIA FRACTURE SURGERY  2006   hardware  . TONSILLECTOMY  1963    Family History  Problem Relation Age of Onset  . Colon cancer Sister   . Cancer Sister  88       sister, colon   . Breast cancer Mother   . Cancer Mother        breast  . Heart disease Other   . Heart disease Father 57  . Stomach cancer Neg Hx   . Rectal cancer Neg Hx     Social History   Socioeconomic History  . Marital status: Married    Spouse name: None  . Number of children: None  . Years of education: None  . Highest education level: None  Social Needs  . Financial resource strain: None  . Food  insecurity - worry: None  . Food insecurity - inability: None  . Transportation needs - medical: None  . Transportation needs - non-medical: None  Occupational History  . Occupation: retired  Tobacco Use  . Smoking status: Current Every Day Smoker    Packs/day: 1.00    Years: 51.00    Pack years: 51.00    Types: Cigarettes    Start date: 05/21/1963  . Smokeless tobacco: Never Used  . Tobacco comment: not ready to quit yet  Substance and Sexual Activity  . Alcohol use: No    Alcohol/week: 0.0 oz    Comment: DRINKS 1/2 PINT LIQUOR DAILY----QUIT 2012  . Drug use: No    Comment: last dose November 2012  . Sexual activity: Yes    Partners: Female    Comment: states no alcohol in 3 weeks  Other Topics Concern  . 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  aspirin EC 81 MG tablet, Take 81 mg by mouth daily. , Disp: , Rfl:  .  buPROPion (WELLBUTRIN XL) 150 MG 24 hr tablet, Take 1 tablet (150 mg total) by mouth daily., Disp: 90 tablet, Rfl: 3 .  diclofenac sodium (VOLTAREN) 1 % GEL, Apply 2 g topically 2 (two) times daily., Disp: 300 g, Rfl: 3 .  furosemide (LASIX) 40 MG tablet, Take 1 tablet (40 mg total) by mouth daily., Disp: 90 tablet, Rfl: 3 .  hydroxypropyl methylcellulose / hypromellose (ISOPTO TEARS / GONIOVISC) 2.5 % ophthalmic solution, Place 1 drop into both eyes 3 (three) times daily as needed for dry eyes., Disp: , Rfl:  .  Multiple Vitamin (MULTIVITAMIN WITH MINERALS) TABS tablet, Take 1 tablet by mouth daily., Disp: , Rfl:  .  potassium chloride SA (K-DUR,KLOR-CON) 20 MEQ tablet, TAKE 1 TABLET BY MOUTH DAILY, Disp: 90 tablet, Rfl: 3 .  traZODone (DESYREL) 150 MG tablet, Take  1 tablet (150 mg total) by mouth at bedtime., Disp: 90 tablet, Rfl: 3  EXAM:  Vitals:   07/07/17 0925  BP: 102/78  Pulse: 72  Temp: 98.3 F (36.8 C)    Body mass index is 58.12 kg/m.  GENERAL: vitals reviewed and listed above, alert, oriented, appears well hydrated and in no acute distress, morbidly obese  HEENT: atraumatic, conjunttiva clear, no obvious abnormalities on inspection of external nose and ears  NECK: no obvious masses on inspection  LUNGS: clear to auscultation bilaterally, no wheezes, rales or rhonchi, good air movement  CV: HRRR, no peripheral edema  MS: moves all extremities without noticeable abnormality  PSYCH: pleasant and cooperative, no obvious depression or anxiety  ASSESSMENT AND PLAN:  Discussed the following assessment and plan:  Super obesity  Aortic atherosclerosis ([place]) -Lifestyle recommendations -Advised may need a statin and recommended checking his cholesterol today, he declined and opted to check at his next visit and consider  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  Patient Instructions  BEFORE YOU LEAVE: -follow up: 3-4 months with fasting labs then and may need to consider cholesterol medication   We recommend the following healthy lifestyle for LIFE: 1) Small portions. But, make sure to get regular (at least 3 per day), healthy  meals and small healthy snacks if needed.  2) Eat a healthy clean diet.   TRY TO EAT: -at least 5-7 servings of low sugar, colorful, and nutrient rich vegetables per day (not corn, potatoes or bananas.) -berries are the best choice if you wish to eat fruit (only eat small amounts if trying to reduce weight)  -lean meets (fish, white meat of chicken or [place]) -vegan proteins for some meals - beans or tofu, whole grains, nuts and seeds -Replace bad fats with good fats - good fats include: fish, nuts and seeds, canola oil, olive oil -small amounts of low fat or non fat dairy -small amounts of100 % whole grains - check the lables -drink plenty of water  AVOID: -SUGAR, sweets, anything with added sugar, corn syrup or sweeteners - must read labels as even foods advertised as "healthy" often are loaded with sugar -if you must have a sweetener, small amounts of stevia may be best -sweetened beverages and artificially sweetened beverages -simple starches (rice, bread, potatoes, pasta, chips, etc - small amounts of 100% whole grains are ok) -red meat, pork, butter -fried foods, fast food, processed food, excessive dairy, eggs and coconut.  3)Get at least 150 minutes of sweaty aerobic exercise per week.  4)Reduce stress - consider counseling, meditation and relaxation to balance other aspects of your life.     Lucretia Kern, DO

## 2017-07-07 ENCOUNTER — Ambulatory Visit: Payer: Medicare Other | Admitting: Family Medicine

## 2017-07-07 ENCOUNTER — Encounter: Payer: Self-pay | Admitting: Family Medicine

## 2017-07-07 VITALS — BP 102/78 | HR 72 | Temp 98.3°F | Ht 71.0 in | Wt >= 6400 oz

## 2017-07-07 DIAGNOSIS — J449 Chronic obstructive pulmonary disease, unspecified: Secondary | ICD-10-CM

## 2017-07-07 DIAGNOSIS — B182 Chronic viral hepatitis C: Secondary | ICD-10-CM | POA: Diagnosis not present

## 2017-07-07 DIAGNOSIS — I700 Atherosclerosis of aorta: Secondary | ICD-10-CM | POA: Diagnosis not present

## 2017-07-07 DIAGNOSIS — G4733 Obstructive sleep apnea (adult) (pediatric): Secondary | ICD-10-CM

## 2017-07-07 DIAGNOSIS — F3342 Major depressive disorder, recurrent, in full remission: Secondary | ICD-10-CM | POA: Diagnosis not present

## 2017-07-07 DIAGNOSIS — F17210 Nicotine dependence, cigarettes, uncomplicated: Secondary | ICD-10-CM | POA: Diagnosis not present

## 2017-07-07 DIAGNOSIS — I5040 Unspecified combined systolic (congestive) and diastolic (congestive) heart failure: Secondary | ICD-10-CM

## 2017-07-07 DIAGNOSIS — E669 Obesity, unspecified: Secondary | ICD-10-CM | POA: Diagnosis not present

## 2017-07-07 MED ORDER — FUROSEMIDE 40 MG PO TABS: 40.0000 mg | ORAL_TABLET | Freq: Every day | ORAL | 3 refills | Status: DC

## 2017-07-07 MED ORDER — POTASSIUM CHLORIDE CRYS ER 20 MEQ PO TBCR: EXTENDED_RELEASE_TABLET | ORAL | 3 refills | Status: DC

## 2017-07-07 MED ORDER — DICLOFENAC SODIUM 1 % TD GEL: 2.0000 g | Freq: Two times a day (BID) | TRANSDERMAL | 3 refills | Status: DC

## 2017-07-07 MED ORDER — BUPROPION HCL ER (XL) 150 MG PO TB24: 150.0000 mg | ORAL_TABLET | Freq: Every day | ORAL | 3 refills | Status: DC

## 2017-07-07 MED ORDER — TRAZODONE HCL 150 MG PO TABS: 150.0000 mg | ORAL_TABLET | Freq: Every day | ORAL | 3 refills | Status: DC

## 2017-07-07 NOTE — Patient Instructions (Signed)
BEFORE YOU LEAVE: -follow up: 3-4 months with fasting labs then and may need to consider cholesterol medication   We recommend the following healthy lifestyle for LIFE: 1) Small portions. But, make sure to get regular (at least 3 per day), healthy meals and small healthy snacks if needed.  2) Eat a healthy clean diet.   TRY TO EAT: -at least 5-7 servings of low sugar, colorful, and nutrient rich vegetables per day (not corn, potatoes or bananas.) -berries are the best choice if you wish to eat fruit (only eat small amounts if trying to reduce weight)  -lean meets (fish, white meat of chicken or [place]) -vegan proteins for some meals - beans or tofu, whole grains, nuts and seeds -Replace bad fats with good fats - good fats include: fish, nuts and seeds, canola oil, olive oil -small amounts of low fat or non fat dairy -small amounts of100 % whole grains - check the lables -drink plenty of water  AVOID: -SUGAR, sweets, anything with added sugar, corn syrup or sweeteners - must read labels as even foods advertised as "healthy" often are loaded with sugar -if you must have a sweetener, small amounts of stevia may be best -sweetened beverages and artificially sweetened beverages -simple starches (rice, bread, potatoes, pasta, chips, etc - small amounts of 100% whole grains are ok) -red meat, pork, butter -fried foods, fast food, processed food, excessive dairy, eggs and coconut.  3)Get at least 150 minutes of sweaty aerobic exercise per week.  4)Reduce stress - consider counseling, meditation and relaxation to balance other aspects of your life.

## 2017-07-09 ENCOUNTER — Ambulatory Visit (HOSPITAL_COMMUNITY)
Admission: RE | Admit: 2017-07-09 | Discharge: 2017-07-09 | Disposition: A | Discharge status: Home / Self Care | Payer: Medicare Other | Source: Ambulatory Visit | Attending: Internal Medicine | Admitting: Internal Medicine

## 2017-07-09 ENCOUNTER — Telehealth: Payer: Self-pay | Admitting: Internal Medicine

## 2017-07-09 DIAGNOSIS — K8020 Calculus of gallbladder without cholecystitis without obstruction: Secondary | ICD-10-CM | POA: Diagnosis not present

## 2017-07-09 DIAGNOSIS — Z86010 Personal history of colonic polyps: Secondary | ICD-10-CM | POA: Insufficient documentation

## 2017-07-09 DIAGNOSIS — Z8719 Personal history of other diseases of the digestive system: Secondary | ICD-10-CM | POA: Insufficient documentation

## 2017-07-09 NOTE — Telephone Encounter (Signed)
See result note.  

## 2017-07-09 NOTE — Telephone Encounter (Signed)
Patient returning call from [place] results today 2.20.19

## 2017-08-04 ENCOUNTER — Other Ambulatory Visit: Payer: Self-pay

## 2017-08-04 ENCOUNTER — Encounter (HOSPITAL_COMMUNITY): Payer: Self-pay | Admitting: Emergency Medicine

## 2017-08-12 ENCOUNTER — Other Ambulatory Visit: Payer: Self-pay

## 2017-08-12 ENCOUNTER — Ambulatory Visit (HOSPITAL_COMMUNITY)
Admission: RE | Admit: 2017-08-12 | Discharge: 2017-08-12 | Disposition: A | Discharge status: Home / Self Care | Payer: Medicare Other | Source: Ambulatory Visit | Attending: Internal Medicine | Admitting: Internal Medicine

## 2017-08-12 ENCOUNTER — Ambulatory Visit (HOSPITAL_COMMUNITY): Payer: Medicare Other | Admitting: Anesthesiology

## 2017-08-12 ENCOUNTER — Encounter (HOSPITAL_COMMUNITY): Payer: Self-pay | Admitting: *Deleted

## 2017-08-12 ENCOUNTER — Encounter (HOSPITAL_COMMUNITY)
Admission: RE | Disposition: A | Discharge status: Home / Self Care | Payer: Self-pay | Source: Ambulatory Visit | Attending: Internal Medicine

## 2017-08-12 DIAGNOSIS — F17210 Nicotine dependence, cigarettes, uncomplicated: Secondary | ICD-10-CM | POA: Diagnosis not present

## 2017-08-12 DIAGNOSIS — G4730 Sleep apnea, unspecified: Secondary | ICD-10-CM | POA: Insufficient documentation

## 2017-08-12 DIAGNOSIS — D126 Benign neoplasm of colon, unspecified: Secondary | ICD-10-CM | POA: Insufficient documentation

## 2017-08-12 DIAGNOSIS — Z800 Family history of malignant neoplasm of digestive organs: Secondary | ICD-10-CM | POA: Insufficient documentation

## 2017-08-12 DIAGNOSIS — D124 Benign neoplasm of descending colon: Secondary | ICD-10-CM

## 2017-08-12 DIAGNOSIS — D123 Benign neoplasm of transverse colon: Secondary | ICD-10-CM | POA: Diagnosis not present

## 2017-08-12 DIAGNOSIS — K648 Other hemorrhoids: Secondary | ICD-10-CM | POA: Insufficient documentation

## 2017-08-12 DIAGNOSIS — I509 Heart failure, unspecified: Secondary | ICD-10-CM | POA: Diagnosis not present

## 2017-08-12 DIAGNOSIS — J449 Chronic obstructive pulmonary disease, unspecified: Secondary | ICD-10-CM | POA: Insufficient documentation

## 2017-08-12 DIAGNOSIS — Z6841 Body Mass Index (BMI) 40.0 and over, adult: Secondary | ICD-10-CM | POA: Insufficient documentation

## 2017-08-12 DIAGNOSIS — Z1211 Encounter for screening for malignant neoplasm of colon: Secondary | ICD-10-CM | POA: Diagnosis present

## 2017-08-12 DIAGNOSIS — K5730 Diverticulosis of large intestine without perforation or abscess without bleeding: Secondary | ICD-10-CM | POA: Insufficient documentation

## 2017-08-12 DIAGNOSIS — Z86010 Personal history of colonic polyps: Secondary | ICD-10-CM | POA: Diagnosis not present

## 2017-08-12 HISTORY — PX: COLONOSCOPY WITH PROPOFOL: SHX5780

## 2017-08-12 SURGERY — COLONOSCOPY WITH PROPOFOL
Anesthesia: Monitor Anesthesia Care

## 2017-08-12 MED ORDER — SODIUM CHLORIDE 0.9 % IV SOLN: INTRAVENOUS | Status: DC

## 2017-08-12 MED ORDER — LIDOCAINE 2% (20 MG/ML) 5 ML SYRINGE
INTRAMUSCULAR | Status: DC | PRN
  Administered 2017-08-12: 100 mg via INTRAVENOUS

## 2017-08-12 MED ORDER — LACTATED RINGERS IV SOLN
INTRAVENOUS | Status: DC
  Administered 2017-08-12: 1000 mL via INTRAVENOUS

## 2017-08-12 MED ORDER — ONDANSETRON HCL 4 MG/2ML IJ SOLN
INTRAMUSCULAR | Status: DC | PRN
  Administered 2017-08-12: 4 mg via INTRAVENOUS

## 2017-08-12 MED ORDER — PROPOFOL 10 MG/ML IV BOLUS
INTRAVENOUS | Status: AC
  Filled 2017-08-12: qty 20

## 2017-08-12 MED ORDER — PROPOFOL 10 MG/ML IV BOLUS
INTRAVENOUS | Status: AC
Start: 2017-08-12 — End: ?
  Filled 2017-08-12: qty 20

## 2017-08-12 MED ORDER — PROPOFOL 10 MG/ML IV BOLUS
INTRAVENOUS | Status: AC
  Filled 2017-08-12: qty 40

## 2017-08-12 MED ORDER — PROPOFOL 500 MG/50ML IV EMUL
INTRAVENOUS | Status: DC | PRN
  Administered 2017-08-12: 130 ug/kg/min via INTRAVENOUS

## 2017-08-12 SURGICAL SUPPLY — 21 items

## 2017-08-12 NOTE — Discharge Instructions (Signed)
YOU HAD AN ENDOSCOPIC PROCEDURE TODAY: Refer to the procedure report and other information in the discharge instructions given to you for any specific questions about what was found during the examination. If this information does not answer your questions, please call [place] office at [phone] to clarify.  ° °YOU SHOULD EXPECT: Some feelings of bloating in the abdomen. Passage of more gas than usual. Walking can help get rid of the air that was put into your GI tract during the procedure and reduce the bloating. If you had a lower endoscopy (such as a colonoscopy or flexible sigmoidoscopy) you may notice spotting of blood in your stool or on the toilet paper. Some abdominal soreness may be present for a day or two, also. ° °DIET: Your first meal following the procedure should be a light meal and then it is ok to progress to your normal diet. A half-sandwich or bowl of soup is an example of a good first meal. Heavy or fried foods are harder to digest and may make you feel nauseous or bloated. Drink plenty of fluids but you should avoid alcoholic beverages for 24 hours. If you had a esophageal dilation, please see attached instructions for diet.   ° °ACTIVITY: Your care partner should take you home directly after the procedure. You should plan to take it easy, moving slowly for the rest of the day. You can resume normal activity the day after the procedure however YOU SHOULD NOT DRIVE, use power tools, machinery or perform tasks that involve climbing or major physical exertion for 24 hours (because of the sedation medicines used during the test).  ° °SYMPTOMS TO REPORT IMMEDIATELY: °A gastroenterologist can be reached at any hour. Please call [phone]  for any of the following symptoms:  °Following lower endoscopy (colonoscopy, flexible sigmoidoscopy) °Excessive amounts of blood in the stool  °Significant tenderness, worsening of abdominal pains  °Swelling of the abdomen that is new, acute  °Fever of 100° or  higher  °Following upper endoscopy (EGD, EUS, ERCP, esophageal dilation) °Vomiting of blood or coffee ground material  °New, significant abdominal pain  °New, significant chest pain or pain under the shoulder blades  °Painful or persistently difficult swallowing  °New shortness of breath  °Black, tarry-looking or red, bloody stools ° °FOLLOW UP:  °If any biopsies were taken you will be contacted by phone or by letter within the next 1-3 weeks. Call [phone]  if you have not heard about the biopsies in 3 weeks.  °Please also call with any specific questions about appointments or follow up tests. ° °

## 2017-08-12 NOTE — Transfer of Care (Signed)
Immediate Anesthesia Transfer of Care Note  Patient: Derrick Mata  Procedure(s) Performed: COLONOSCOPY WITH PROPOFOL (N/A )  Patient Location: PACU and Endoscopy Unit  Anesthesia Type:MAC  Level of Consciousness: awake, alert , oriented and patient cooperative  Airway & Oxygen Therapy: Patient Spontanous Breathing and Patient connected to face mask oxygen  Post-op Assessment: Report given to RN, Post -op Vital signs reviewed and stable and Patient moving all extremities  Post vital signs: Reviewed and stable  Last Vitals:  Vitals Value Taken Time  BP 132/111 08/12/2017 11:18 AM  Temp    Pulse 74 08/12/2017 11:19 AM  Resp 32 08/12/2017 11:19 AM  SpO2 93 % 08/12/2017 11:19 AM  Vitals shown include unvalidated device data.  Last Pain:  Vitals:   08/12/17 1118  TempSrc:   PainSc: 0-No pain         Complications: No apparent anesthesia complications

## 2017-08-12 NOTE — Anesthesia Postprocedure Evaluation (Signed)
Anesthesia Post Note  Patient: Derrick Mata  Procedure(s) Performed: COLONOSCOPY WITH PROPOFOL (N/A )     Patient location during evaluation: PACU Anesthesia Type: MAC Level of consciousness: awake and alert Pain management: pain level controlled Vital Signs Assessment: post-procedure vital signs reviewed and stable Respiratory status: spontaneous breathing, nonlabored ventilation, respiratory function stable and patient connected to nasal cannula oxygen Cardiovascular status: stable and blood pressure returned to baseline Postop Assessment: no apparent nausea or vomiting Anesthetic complications: no    Last Vitals:  Vitals:   08/12/17 1125 08/12/17 1135  BP: (!) 125/57 109/65  Pulse: 73 62  Resp: 20 10  Temp:    SpO2: 96% 90%    Last Pain:  Vitals:   08/12/17 1135  TempSrc:   PainSc: 0-No pain                 Camauri Craton COKER

## 2017-08-12 NOTE — Op Note (Signed)
[facility] Patient Name: Derrick Mata Procedure Date: 08/12/2017 MRN: [id number] Attending MD: Docia Chuck. [name] , MD Date of Birth: 20-Aug-1950 CSN: [id number] Age: 67 Admit Type: Outpatient Procedure:                Colonoscopy, with cold snare polypectomy x 6 Indications:              High risk colon cancer surveillance: Personal                            history of multiple (3 or more) adenomas. Multiple                            previous exams with multiple adenomas including                            2002, 2003, 2007, 2012, in 2017. Family history of                            colon cancer Providers:                Docia Chuck. [name], MD, Cleda Daub, RN, Laurena Spies, Technician, Courtney Heys. [place], CRNA Referring MD:              Medicines:                Monitored Anesthesia Care Complications:            No immediate complications. Estimated blood loss:                            None. Estimated Blood Loss:     Estimated blood loss: none. Procedure:                Pre-Anesthesia Assessment:                           - Prior to the procedure, a History and Physical                            was performed, and patient medications and                            allergies were reviewed. The patient's tolerance of                            previous anesthesia was also reviewed. The risks                            and benefits of the procedure and the sedation                            options and risks were discussed with the patient.  All questions were answered, and informed consent                            was obtained. Prior Anticoagulants: The patient has                            taken no previous anticoagulant or antiplatelet                            agents. ASA Grade Assessment: III - A patient with                            severe systemic disease. After reviewing the risks             and benefits, the patient was deemed in                            satisfactory condition to undergo the procedure.                           After obtaining informed consent, the colonoscope                            was passed under direct vision. Throughout the                            procedure, the patient's blood pressure, pulse, and                            oxygen saturations were monitored continuously. The                            Colonoscope was introduced through the anus and                            advanced to the the cecum, identified by                            appendiceal orifice and ileocecal valve. The                            ileocecal valve, appendiceal orifice, and rectum                            were photographed. The quality of the bowel                            preparation was excellent. The colonoscopy was                            performed without difficulty. The patient tolerated                            the procedure well. The bowel preparation used was  SUPREP. Scope In: 10:44:34 AM Scope Out: 11:08:21 AM Scope Withdrawal Time: 0 hours 20 minutes 9 seconds  Total Procedure Duration: 0 hours 23 minutes 47 seconds  Findings:      Six polyps were found in the descending colon and transverse colon. The       polyps were 2 to 4 mm in size. These polyps were removed with a cold       snare. Resection and retrieval were complete.      Multiple diverticula were found in the left colon.      Internal hemorrhoids were found during retroflexion.      The exam was otherwise without abnormality on direct and retroflexion       views. Impression:               - Six diminutive polyps in the descending colon and                            in the transverse colon, removed with a cold snare.                            Resected and retrieved.                           - Diverticulosis in the left colon.                            - Internal hemorrhoids.                           - The examination was otherwise normal on direct                            and retroflexion views. Moderate Sedation:      none Recommendation:           - Repeat colonoscopy in 3 years for surveillance,                            if medically fit.                           - Patient has a contact number available for                            emergencies. The signs and symptoms of potential                            delayed complications were discussed with the                            patient. Return to normal activities tomorrow.                            Written discharge instructions were provided to the                            patient.                           -  Resume previous diet.                           - Continue present medications.                           - Await pathology results. Procedure Code(s):        --- Professional ---                           [id number], Colonoscopy, flexible; with removal of                            tumor(s), polyp(s), or other lesion(s) by snare                            technique Diagnosis Code(s):        --- Professional ---                           Z86.010, Personal history of colonic polyps                           D12.4, Benign neoplasm of descending colon                           D12.3, Benign neoplasm of transverse colon (hepatic                            flexure or splenic flexure)                           K64.8, Other hemorrhoids                           K57.30, Diverticulosis of large intestine without                            perforation or abscess without bleeding CPT copyright 2016 American Medical Association. All rights reserved. The codes documented in this report are preliminary and upon coder review may  be revised to meet current compliance requirements. Docia Chuck. [name], MD 08/12/2017 11:12:45 AM This report has been signed electronically. Number  of Addenda: 0

## 2017-08-12 NOTE — H&P (Signed)
Editor: Irene Shipper, MD (Physician)     HISTORY OF PRESENT ILLNESS:  Derrick Mata is a 67 y.o. 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 Patient presents today for follow-up regarding the need for surveillance colonoscopy and management of his chronic liver disease. He was last evaluated in the office 02/19/2016. See that dictation for details. He subsequently underwent upper endoscopy. No varices. His imaging study from last made not show any hepatic mass. Surveillance colonoscopy revealed 15 polyps which were removed and found to be adenomatous. Follow-up in one year medically fit recommended. The patient tells me that he has not been hospitalized and has been medically stable. Despite his massive obesity he has been slowly losing weight. He continues to smoke  REVIEW OF SYSTEMS:  All non-GI ROS negative unless otherwise stated in the history of present illness except for anxiety, arthritis, cough      Past Medical History:  Diagnosis Date  . Arthritis    OA of knees  . CHF (congestive heart failure) ([place])   . Colon polyp   . Complication of anesthesia    slow to wake up  . COPD (chronic obstructive pulmonary disease) ([facility])    > 30 pack yr smoker  . Depression    Anxiety and Insomnia  . Diverticulosis   . Erectile dysfunction   . GERD (gastroesophageal reflux disease)   . Hemorrhoids   . Hep C w/o coma, chronic ([place]) 06/12/2015   treated with medications  . Leg edema   . Morbid obesity ([place])   . Sleep apnea    refuses CPAP  . Sleep apnea   . 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 COLONOSCOPY  04/18/2011   Procedure: COLONOSCOPY;  Surgeon: Scarlette Shorts, MD;  Location: [facility];  Service: Endoscopy;   Laterality: N/A;  . COLONOSCOPY WITH PROPOFOL N/A 05/07/2016   Procedure: COLONOSCOPY WITH PROPOFOL;  Surgeon: Irene Shipper, MD;  Location: [facility];  Service: Endoscopy;  Laterality: N/A;  . ESOPHAGOGASTRODUODENOSCOPY (EGD) WITH PROPOFOL N/A 05/07/2016   Procedure: ESOPHAGOGASTRODUODENOSCOPY (EGD) WITH PROPOFOL;  Surgeon: Irene Shipper, MD;  Location: [facility];  Service: Endoscopy;  Laterality: N/A;  . KNEE ARTHROSCOPY  2011   left  . [place], 2010  . TIBIA FRACTURE SURGERY  2006   hardware  . TONSILLECTOMY  1963    Social History Derrick Mata  reports that he has been smoking cigarettes.  He started smoking about 54 years ago. He has a 51.00 pack-year smoking history. he has never used smokeless tobacco. He reports that he does not drink alcohol or use drugs.  family history includes Breast cancer in his mother; Cancer in his mother; Cancer (age of onset: 53) in his sister; Colon cancer in his sister; Heart disease in his other; Heart disease (age of onset: 34) in his father.       Allergies  Allergen Reactions  . Penicillins Anaphylaxis    REACTION: nausea Passed out.  Has patient had a PCN reaction causing immediate rash, facial/tongue/throat swelling, SOB or lightheadedness with hypotension: {no Has patient had a PCN reaction causing severe rash involving mucus membranes or skin necrosis: {no Has patient had a PCN reaction that  required hospitalization {no Has patient had a PCN reaction occurring within the last 10 years: {no If all of the above answers are "NO", then may proceed with Cephalosporin use.  . Sulfamethoxazole     REACTION: urticaria (hives)       PHYSICAL EXAMINATION: Vital signs: BP 130/80   Pulse 74   Ht _0  (1.803 m)   Wt (!) 415 lb (188.2 kg)   BMI 57.88 kg/m   Constitutional: Pleasant, massively obese but generally well-appearing, no acute distress Psychiatric: alert and oriented x3,  cooperative Eyes: extraocular movements intact, anicteric, conjunctiva pink Mouth: oral pharynx moist, no lesions Neck: supple no lymphadenopathy Cardiovascular: heart regular rate and rhythm, no murmur Lungs: clear to auscultation bilaterally Abdomen: soft, obese, nontender, nondistended, no obvious ascites, no peritoneal signs, normal bowel sounds, no organomegaly Rectal: Deferred until colonoscopy Extremities: no clubbing or cyanosis. 1+ lower extremity edema bilaterally Skin: no lesions on visible extremities Neuro: No focal deficits. Cranial nerves intact. No asterixis.   ASSESSMENT:  #1. History of multiple adenomatous colon polyps. 15 adenomas December 2017. Due for follow-up surveillance. Appropriate candidate without strict contraindication #2. History of hepatitis C status post Harvoni therapy (successful) with question of cirrhosis on elastography #3. Morbid obesity  PLAN:  #1. Liver ultrasound to screen for [place] #2. Surveillance colonoscopy. The patient is HIGH RISK given his body habitus. As such, the examination will need to be scheduled at the hospital with MAC as previous.The nature of the procedure, as well as the risks, benefits, and alternatives were carefully and thoroughly reviewed with the patient. Ample time for discussion and questions allowed. The patient understood, was satisfied, and agreed to proceed. #4. Weight loss. Advised #5. Stop smoking. Advised #6. Ongoing general medical care with PCP and monitoring of hepatitis C per his ID specialist  GI ATTENDING  Interval history data reviewed. Patient stable since his office evaluation. No interval issues. Now for surveillance colonoscopy. Tolerated prep.The nature of the procedure, as well as the risks, benefits, and alternatives were carefully and thoroughly reviewed with the patient. Ample time for discussion and questions allowed. The patient understood, was satisfied, and agreed to proceed.  Docia Chuck.  Geri Seminole., M.D. [facility] Division of Gastroenterology

## 2017-08-12 NOTE — Anesthesia Preprocedure Evaluation (Addendum)
Anesthesia Evaluation  Patient identified by MRN, date of birth, ID band Patient awake    Reviewed: Allergy & Precautions, NPO status , Patient's Chart, lab work & pertinent test results  Airway Mallampati: II  TM Distance: >3 FB Neck ROM: Full    Dental  (+) Teeth Intact, Dental Advisory Given   Pulmonary Current Smoker,    breath sounds clear to auscultation       Cardiovascular  Rhythm:Regular Rate:Normal     Neuro/Psych    GI/Hepatic   Endo/Other    Renal/GU      Musculoskeletal   Abdominal (+) + obese,   Peds  Hematology   Anesthesia Other Findings   Reproductive/Obstetrics                            Anesthesia Physical Anesthesia Plan  ASA: III  Anesthesia Plan: MAC   Post-op Pain Management:    Induction: Intravenous  PONV Risk Score and Plan: Ondansetron  Airway Management Planned: Natural Airway and Nasal Cannula  Additional Equipment:   Intra-op Plan:   Post-operative Plan:   Informed Consent: I have reviewed the patients History and Physical, chart, labs and discussed the procedure including the risks, benefits and alternatives for the proposed anesthesia with the patient or authorized representative who has indicated his/her understanding and acceptance.   Dental advisory given  Plan Discussed with: CRNA and Anesthesiologist  Anesthesia Plan Comments:         Anesthesia Quick Evaluation

## 2017-08-14 ENCOUNTER — Encounter (HOSPITAL_COMMUNITY): Payer: Self-pay | Admitting: Internal Medicine

## 2017-09-04 ENCOUNTER — Encounter: Payer: Self-pay | Admitting: Internal Medicine

## 2017-09-04 ENCOUNTER — Ambulatory Visit: Payer: Medicare Other | Admitting: Internal Medicine

## 2017-09-04 VITALS — BP 132/82 | HR 84 | Temp 97.7°F | Wt >= 6400 oz

## 2017-09-04 DIAGNOSIS — M5410 Radiculopathy, site unspecified: Secondary | ICD-10-CM | POA: Diagnosis not present

## 2017-09-04 DIAGNOSIS — Z9889 Other specified postprocedural states: Secondary | ICD-10-CM

## 2017-09-04 MED ORDER — PREDNISONE 20 MG PO TABS: ORAL_TABLET | ORAL | 0 refills | Status: DC

## 2017-09-04 NOTE — Progress Notes (Signed)
Chief Complaint  Patient presents with  . Leg Pain    back of the thigh, left leg, x10 days, took hydrocodone no relief, 9/10 pain, throbbing pain,    HPI: Derrick Mata 67 y.o.   sda  PCP booked [email or web address]  Sudden onset  Of serious pain   Back of  Left thigh and buttocks   Like  super toothache  About 10 days ago  Reminds  Of  henriated disc issues  Pain he had in past when he had back surgery    Super tooth ache    Despite left over hydrocodone given by an ortho group after beinseen in ed last year   Had 8 left .  Marland Kitchen  No weakness  but hard to walk cause of pain .   No bowel or bladder  Changes   .  Pain goes down to knee  But no numbness in feet ?   Sudden onset      Remote hx of back pain an dsurgery    remote  Left  And then right .   Dr Rushie Nyhan since retired    ROS: See pertinent positives and negatives per HPI.  Past Medical History:  Diagnosis Date  . Arthritis    OA of knees  . CHF (congestive heart failure) ([place])   . Closed compression fracture of L1 lumbar vertebra 09/03/2016  . Colon polyp   . Complication of anesthesia    slow to wake up  . COPD (chronic obstructive pulmonary disease) ([facility])    > 30 pack yr smoker  . Depression    Anxiety and Insomnia  . Diverticulosis   . Erectile dysfunction   . GERD (gastroesophageal reflux disease)   . Hemorrhoids   . Hep C w/o coma, chronic ([place]) 06/12/2015   treated with medications  . Leg edema   . Morbid obesity ([place])   . Sleep apnea    refuses CPAP  . Sleep apnea   . 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 Colon cancer Sister   . Cancer Sister 78       sister, colon   . Breast cancer Mother   . Cancer Mother        breast  . Heart disease Other   . Heart disease Father 45  . Stomach cancer Neg Hx   . Rectal cancer Neg Hx     Social History   Socioeconomic History  . Marital  status: Married    Spouse name: Not on file  . Number of children: Not on file  . Years of education: Not on file  . Highest education level: Not on file  Occupational History  . Occupation: retired  Scientific laboratory technician  . Financial resource strain: Not on file  . Food insecurity:    Worry: Not on file    Inability: Not on file  . Transportation needs:    Medical: Not on file    Non-medical: Not on file  Tobacco Use  . Smoking status: Current Every Day Smoker    Packs/day: 1.00    Years: 51.00    Pack years: 51.00    Types: Cigarettes    Start date: 05/21/1963  . Smokeless tobacco: Never Used  . Tobacco comment: not ready to quit yet  Substance and Sexual Activity  . Alcohol  use: No    Alcohol/week: 0.0 oz  . Drug use: No    Comment: last dose November 2012  . Sexual activity: Yes    Partners: Female    Comment: states no alcohol in 3 weeks  Lifestyle  . Physical activity:    Days per week: Not on file    Minutes per session: Not on file  . Stress: Not on file  Relationships  . Social connections:    Talks on phone: Not on file    Gets together: Not on file    Attends religious service: Not on file    Active member of club or organization: Not on file    Attends meetings of clubs or organizations: Not on file    Relationship status: Not on file  Other Topics Concern  . 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 diclofenac sodium (VOLTAREN) 1 % GEL Apply 2 g topically 2 (two) times daily. 300 g 3  . aspirin EC 81 MG tablet Take 81 mg by mouth at bedtime.     Marland Kitchen buPROPion (WELLBUTRIN XL) 150 MG 24 hr tablet Take 1 tablet (150 mg  total) by mouth daily. 90 tablet 3  . furosemide (LASIX) 40 MG tablet Take 1 tablet (40 mg total) by mouth daily. 90 tablet 3  . hydroxypropyl methylcellulose / hypromellose (ISOPTO TEARS / GONIOVISC) 2.5 % ophthalmic solution Place 1 drop into both eyes 3 (three) times daily as needed for dry eyes.    . Multiple Vitamin (MULTIVITAMIN WITH MINERALS) TABS tablet Take 1 tablet by mouth daily.    Marland Kitchen omeprazole (PRILOSEC) 20 MG capsule Take 20 mg by mouth daily.    . potassium chloride SA (K-DUR,KLOR-CON) 20 MEQ tablet TAKE 1 TABLET BY MOUTH DAILY (Patient taking differently: Take 20 mEq by mouth daily. ) 90 tablet 3  . traZODone (DESYREL) 150 MG tablet Take 1 tablet (150 mg total) by mouth at bedtime. 90 tablet 3   No facility-administered medications prior to visit.      EXAM:  BP 132/82 (BP Location: Left Arm, Patient Position: Sitting, Cuff Size: Large) Comment (Cuff Size): thigh cuff used  Pulse 84   Temp 97.7 F (36.5 C) (Oral)   Wt (!) 410 lb 12.8 oz (186.3 kg)   SpO2 95%   BMI 57.29 kg/m   Body mass index is 57.29 kg/m.  GENERAL: vitals reviewed and listed above, alert, oriented, appears well hydrated and in no mod distress when arises from chair and  Antalgic gait  HEENT: atraumatic, conjunctiva  clear, no obvious abnormalities on inspection of external nose and ears   NECK: no obvious masses on inspection palpation  MS: moves all extremities    No midline tenderness but pain left buttocks and  Left thigh   Hard to tell  Toe heel ? Not weak  Some pain on plantarflexion of r and left   Unable to perform reflexes  Practically  Pain and  Obesity .     skin no unusual rash noted    ASSESSMENT  AND PLAN:  Discussed the following assessment and plan:  Radicular pain of left lower extremity - ? scaitic vs other   unresponsive to left over narcotic med  add prednisone  and advise see specialist   Morbid obesity ([place])  History of back surgery We discussed   Referral   And advise  poss neurosurgery  Consult .    Said had MRI a year ago? When had a different acute back pain   Dr Erlinda Hong?  Pain severity seem quite high at this time and if needs to seekc are over holiday then  Go to ed.   Will discuss w dr Maudie Mercury about  A referral  . Opinion    Disc with dr  Maudie Mercury  She agrees with referral  Will proceed  -Patient advised to return or notify health care team  if symptoms worsen ,persist or new concerns arise.  Patient Instructions  You should see   A ortho or spine specialist   consider seeing neurosurgeon .   Prednisone  Today   To  For decrease inflammation .  An hopefully pain.    I will   Get with dr Maudie Mercury about a referral to spine specialist .   If  Seriously worse  Can go to ed but that is for short term  Evaluation .         Standley Brooking. Derrick Mata M.D.

## 2017-09-04 NOTE — Patient Instructions (Addendum)
You should see   A ortho or spine specialist   consider seeing neurosurgeon .   Prednisone  Today   To  For decrease inflammation .  An hopefully pain.    I will   Get with dr Maudie Mercury about a referral to spine specialist .   If  Seriously worse  Can go to ed but that is for short term  Evaluation .

## 2017-10-07 ENCOUNTER — Telehealth: Payer: Self-pay

## 2017-10-07 ENCOUNTER — Ambulatory Visit: Payer: Medicare Other | Admitting: Internal Medicine

## 2017-10-07 NOTE — Telephone Encounter (Signed)
Pt called today to cancel his appt with Dr. Linus Salmons states that he is not feeling well and will call our office back once he is ready to make an appt. [name]

## 2017-11-03 NOTE — Progress Notes (Signed)
HPI:  Using dictation device. Unfortunately this device frequently misinterprets words/phrases.  Derrick Mata is a pleasant 67 y.o. here for follow up. Chronic medical problems summarized below were reviewed for changes and stability and were updated as needed below. These issues and their treatment remain stable for the most part. No significant complaints today. Occ allergies and very faint HA at times. Did not take lasix yet this morning and BP a little up on arrival, much better on recheck. Reports his back pain resolved completely. Reports he saw a back specialist, but not further treatment was required. Reports mood is good. Denies CP, SOB, DOE, treatment intolerance or new symptoms.  Morbid Obesity/CdCHF/Dependant Edema/Hyperglycemia/Aortic atherosclerosis: -reports has lost >100lbs in the past- used to weigh >500lbs -soda and bread are his weakness per his report -no regular exercise, reports bad OA of the knees -eval with cardiology in 2014 -meds:asa, lasix 40mg  bid, potassium chloride  Tobacco Use/COPD/emphysemaOSA: -1ppd for > 30 years -denies: SOB, DOE -reports not interested in quitting, aware of risks -he did lung cancer screening with Eric Form 06/2017 -referred to pulmonology by prior PCP -referred to sleep specialist, refused to wear CPAP  Hx of Depression, Anxiety, hx substance abuse and Insomnia/OSA: -meds: wellbutrin 150mg  daily and trazadone 150 mg nightly started by prior PCP -reports depression and anxiety well controlled for many years -reported at initial visit, last bout with drugs and alcohol was in 2012 - when lost relative -used cocaine early in life, in [facility] x2 remotely  GERD:  -resolved  ED: -on viagra; startedwith prior PCP  Hepatitis C/Cirrhosis: -noncompliant history -finally completed Harvoni in 2017 -managed in hepatology clinic  OA: -referred to ortho in the past -uses diclofenac topical - advised of  risks/interactions, lidocaine top   ROS: See pertinent positives and negatives per HPI.  Past Medical History:  Diagnosis Date  . Arthritis    OA of knees  . CHF (congestive heart failure) ([place])   . Closed compression fracture of L1 lumbar vertebra 09/03/2016  . Colon polyp   . Complication of anesthesia    slow to wake up  . COPD (chronic obstructive pulmonary disease) (HCC)    > 30 pack yr smoker  . Depression    Anxiety and Insomnia  . Diverticulosis   . Erectile dysfunction   . GERD (gastroesophageal reflux disease)   . Hemorrhoids   . Hep C w/o coma, chronic ([place]) 06/12/2015   treated with medications  . Leg edema   . Morbid obesity ([place])   . Sleep apnea    refuses CPAP  . Sleep apnea   . 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 COLONOSCOPY  04/18/2011   Procedure: COLONOSCOPY;  Surgeon: Scarlette Shorts, MD;  [facility];  Service: Endoscopy;  Laterality: N/A;  . COLONOSCOPY WITH PROPOFOL N/A 05/07/2016   Procedure: COLONOSCOPY WITH PROPOFOL;  Surgeon: Irene Shipper, MD;  [facility];  Service: Endoscopy;  Laterality: N/A;  . COLONOSCOPY WITH PROPOFOL N/A 08/12/2017   Procedure: COLONOSCOPY WITH PROPOFOL;  Surgeon: Irene Shipper, MD;  [facility];  Service: Endoscopy;  Laterality: N/A;  . ESOPHAGOGASTRODUODENOSCOPY (EGD) WITH PROPOFOL N/A 05/07/2016   Procedure: ESOPHAGOGASTRODUODENOSCOPY (EGD) WITH PROPOFOL;  Surgeon: Irene Shipper, MD;  [facility];  Service: Endoscopy;  Laterality: N/A;  . KNEE ARTHROSCOPY  2011  left  . [place], 2010  . TIBIA FRACTURE SURGERY  2006   hardware  . TONSILLECTOMY  1963    Family History  Problem Relation Age of Onset  . Colon cancer Sister   . Cancer Sister 72       sister, colon   . Breast cancer Mother   . Cancer Mother        breast  .  Heart disease Other   . Heart disease Father 67  . Stomach cancer Neg Hx   . Rectal cancer Neg Hx     SOCIAL HX: see hpi   Current Outpatient Medications:  .  aspirin EC 81 MG tablet, Take 81 mg by mouth at bedtime. , Disp: , Rfl:  .  buPROPion (WELLBUTRIN XL) 150 MG 24 hr tablet, Take 1 tablet (150 mg total) by mouth daily., Disp: 90 tablet, Rfl: 3 .  diclofenac sodium (VOLTAREN) 1 % GEL, Apply 2 g topically 2 (two) times daily., Disp: 300 g, Rfl: 3 .  furosemide (LASIX) 40 MG tablet, Take 1 tablet (40 mg total) by mouth daily., Disp: 90 tablet, Rfl: 3 .  hydroxypropyl methylcellulose / hypromellose (ISOPTO TEARS / GONIOVISC) 2.5 % ophthalmic solution, Place 1 drop into both eyes 3 (three) times daily as needed for dry eyes., Disp: , Rfl:  .  Multiple Vitamin (MULTIVITAMIN WITH MINERALS) TABS tablet, Take 1 tablet by mouth daily., Disp: , Rfl:  .  omeprazole (PRILOSEC) 20 MG capsule, Take 20 mg by mouth daily., Disp: , Rfl:  .  potassium chloride SA (K-DUR,KLOR-CON) 20 MEQ tablet, TAKE 1 TABLET BY MOUTH DAILY (Patient taking differently: Take 20 mEq by mouth daily. ), Disp: 90 tablet, Rfl: 3 .  predniSONE (DELTASONE) 20 MG tablet, Take 3,3,3,2,2,2,1,1,1, 1/.2 1./2 1/.2 pills qd, Disp: 24 tablet, Rfl: 0 .  traZODone (DESYREL) 150 MG tablet, Take 1 tablet (150 mg total) by mouth at bedtime., Disp: 90 tablet, Rfl: 3  EXAM:  Vitals:   11/04/17 0953  BP: 118/74  Pulse: 72  Temp: 98.6 F (37 C)  SpO2: 95%    Body mass index is 56.4 kg/m.  GENERAL: vitals reviewed and listed above, alert, oriented, appears well hydrated and in no acute distress  HEENT: atraumatic, conjunttiva clear, no obvious abnormalities on inspection of external nose and ears  NECK: no obvious masses on inspection  LUNGS: clear to auscultation bilaterally, no wheezes, rales or rhonchi, good air movement  CV: HRRR, no peripheral edema  MS: moves all extremities without noticeable abnormality  PSYCH:  pleasant and cooperative, no obvious depression or anxiety  ASSESSMENT AND PLAN:  Discussed the following assessment and plan:  Low back pain without sciatica, unspecified back pain laterality, unspecified chronicity -saw specialist -symptoms resolved with prednisone  Recurrent major depressive disorder, in full remission ([place]) -pt reports stable -cont current tx  Aortic atherosclerosis (HCC) Hyperglycemia -lifestyle recs -have advised to quit somking  Cirrhosis of liver without ascites, unspecified hepatic cirrhosis type ([place]) -sees GI/hepatology  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  Patient Instructions  BEFORE YOU LEAVE: -follow up: 3-4 months  We recommend the following healthy lifestyle for LIFE: 1) Small portions. But, make sure to get regular (at least 3 per day), healthy meals and small healthy snacks if needed.  2) Eat a healthy clean diet.   TRY TO EAT: -at least 5-7 servings of low sugar, colorful, and nutrient rich vegetables per day (not corn, potatoes or bananas.) -berries are the best choice if you wish to eat fruit (only eat small amounts if trying to reduce weight)  -lean meets (fish, white meat of chicken or [place]) -vegan proteins for some meals - beans or tofu, whole grains, nuts and seeds -Replace bad fats with good fats - good fats include: fish, nuts and seeds, canola oil, olive oil -small amounts of low fat or non fat dairy -small amounts of100 % whole grains - check the lables -drink plenty of  water  AVOID: -SUGAR, sweets, anything with added sugar, corn syrup or sweeteners - must read labels as even foods advertised as "healthy" often are loaded with sugar -if you must have a sweetener, small amounts of stevia may be best -sweetened beverages and artificially sweetened beverages -simple starches (rice, bread, potatoes, pasta, chips, etc - small amounts of 100% whole grains are ok) -red meat, pork, butter -fried foods, fast food, processed food, excessive dairy, eggs and coconut.  3)Get at least 150 minutes of sweaty aerobic exercise per week.  4)Reduce stress - consider counseling, meditation and relaxation to balance other aspects of your life.     Lucretia Kern, DO

## 2017-11-04 ENCOUNTER — Ambulatory Visit: Payer: Medicare Other | Admitting: Family Medicine

## 2017-11-04 VITALS — BP 118/74 | HR 72 | Temp 98.6°F | Wt >= 6400 oz

## 2017-11-04 DIAGNOSIS — F3342 Major depressive disorder, recurrent, in full remission: Secondary | ICD-10-CM | POA: Diagnosis not present

## 2017-11-04 DIAGNOSIS — M545 Low back pain, unspecified: Secondary | ICD-10-CM

## 2017-11-04 DIAGNOSIS — R739 Hyperglycemia, unspecified: Secondary | ICD-10-CM | POA: Diagnosis not present

## 2017-11-04 DIAGNOSIS — J449 Chronic obstructive pulmonary disease, unspecified: Secondary | ICD-10-CM | POA: Diagnosis not present

## 2017-11-04 DIAGNOSIS — I5040 Unspecified combined systolic (congestive) and diastolic (congestive) heart failure: Secondary | ICD-10-CM

## 2017-11-04 DIAGNOSIS — F17200 Nicotine dependence, unspecified, uncomplicated: Secondary | ICD-10-CM | POA: Diagnosis not present

## 2017-11-04 DIAGNOSIS — E669 Obesity, unspecified: Secondary | ICD-10-CM

## 2017-11-04 DIAGNOSIS — G4733 Obstructive sleep apnea (adult) (pediatric): Secondary | ICD-10-CM

## 2017-11-04 DIAGNOSIS — E662 Morbid (severe) obesity with alveolar hypoventilation: Secondary | ICD-10-CM | POA: Diagnosis not present

## 2017-11-04 DIAGNOSIS — I700 Atherosclerosis of aorta: Secondary | ICD-10-CM | POA: Diagnosis not present

## 2017-11-04 DIAGNOSIS — K7460 Unspecified cirrhosis of liver: Secondary | ICD-10-CM | POA: Diagnosis not present

## 2017-11-04 NOTE — Patient Instructions (Signed)
BEFORE YOU LEAVE: -follow up: 3-4 months   We recommend the following healthy lifestyle for LIFE: 1) Small portions. But, make sure to get regular (at least 3 per day), healthy meals and small healthy snacks if needed.  2) Eat a healthy clean diet.   TRY TO EAT: -at least 5-7 servings of low sugar, colorful, and nutrient rich vegetables per day (not corn, potatoes or bananas.) -berries are the best choice if you wish to eat fruit (only eat small amounts if trying to reduce weight)  -lean meets (fish, white meat of chicken or turkey) -vegan proteins for some meals - beans or tofu, whole grains, nuts and seeds -Replace bad fats with good fats - good fats include: fish, nuts and seeds, canola oil, olive oil -small amounts of low fat or non fat dairy -small amounts of100 % whole grains - check the lables -drink plenty of water  AVOID: -SUGAR, sweets, anything with added sugar, corn syrup or sweeteners - must read labels as even foods advertised as "healthy" often are loaded with sugar -if you must have a sweetener, small amounts of stevia may be best -sweetened beverages and artificially sweetened beverages -simple starches (rice, bread, potatoes, pasta, chips, etc - small amounts of 100% whole grains are ok) -red meat, pork, butter -fried foods, fast food, processed food, excessive dairy, eggs and coconut.  3)Get at least 150 minutes of sweaty aerobic exercise per week.  4)Reduce stress - consider counseling, meditation and relaxation to balance other aspects of your life.  

## 2018-02-10 ENCOUNTER — Telehealth: Payer: Self-pay | Admitting: Family Medicine

## 2018-02-10 ENCOUNTER — Telehealth: Payer: Self-pay | Admitting: Cardiovascular Disease

## 2018-02-10 NOTE — Telephone Encounter (Signed)
Pt returned call regarding message from his provider. Pt stated that he had already taken 3 prednisone that he had left over from a bottle labeled 09/04/17 that had been prescribed for him. It did help his back some. He also said that the swelling that he has comes from his overeating. He had eaten a whole big bag of raisinnets and eating a bag of heath bars. He denies swelling in his feet He had to take 2 Trazodone, 150 mg for sleep last night. He has tried to use ice and heat but neither one helps him.  He stated that he will start taking Aleve to him with his pain, even though he is not suppose to. He thinks he may go to the emergency department because his pain is so bad. Pain # 9.8, per pt.  Asked if he needs a referral to an orthopedist or see sports medicine. Declined at this time. Has an appointment coming up with his pcp on 02/19/18.

## 2018-02-10 NOTE — Telephone Encounter (Signed)
I left a message for the pt to return my call. 

## 2018-02-10 NOTE — Telephone Encounter (Signed)
I see that he has not seen his cardiologist in some time on further review of chart. Recommend re-referral to cardiology. Increase lasix to 1.5 tablets daily for 3 days, eat healthy and restrict salt/excessive fluids and monitor weights. Follow up in the next 1 week here.

## 2018-02-10 NOTE — Telephone Encounter (Signed)
I left a message for the pt to return my call.  CRM also created. 

## 2018-02-10 NOTE — Telephone Encounter (Signed)
Left message for pt and Derrick Mata with [facility].Marland Kitchen Re: her message.. Not sure who has been following the pt not seen here since 2014.

## 2018-02-10 NOTE — Telephone Encounter (Signed)
Recommend contacting his cardiologist for management.

## 2018-02-10 NOTE — Telephone Encounter (Signed)
Copied from [place] [id number]. Topic: Quick Communication - See Telephone Encounter >> Feb 10, 2018 10:57 AM Ahmed Prima L wrote: CRM for notification. See Telephone encounter for: 02/10/18.  Horris Latino, RN with [facility] called and stated that he is in their heart failure program. She called to report that he has had a weight gain of 8.4 pounds in 3 days. She will be faxing over the report.

## 2018-02-10 NOTE — Telephone Encounter (Signed)
New Message        Per Horris Latino with [facility]  Is calling today to report  A   8 pound gain in 3 days. Horris Latino will fax over information.

## 2018-02-10 NOTE — Telephone Encounter (Signed)
Spoke with the pt and advised him that he has not been seen since 2014.Marland Kitchen Pt says he would like to get reestablished with a Cardiologist for on and off edema... I advised him that I will send his info to our schedulers and to get him an upcoming appointment. Pt is seeing his family Practice Physician 02/19/2018.

## 2018-02-10 NOTE — Telephone Encounter (Signed)
Patient called back and was informed of the message below.  Patient agreed to increase Lasix as instructed, referral entered for cardiologist and an appt scheduled for a follow up with Dr Maudie Mercury for 10/3.  Patient complains of severe back pain and requested a refill on Prednisone which was given after a visit with Dr Regis Bill on 09/04/2017 here.  Message sent to Dr Maudie Mercury.

## 2018-02-10 NOTE — Telephone Encounter (Signed)
Patient called back and was informed of the message below.  He stated he did see someone that ordered an MRI and could not recall the physicians name. I gave him the phone number to call [facility] as this was the referral info in the chart and he agreed to call their office.

## 2018-02-10 NOTE — Telephone Encounter (Signed)
We referred him to spine specialist the last time this happened and he told me he had seen them. Iif the pain is that bad highly advise he go to the ER if can't get in with specialist. I advise against him taking extra doses of medications without evaluation. He def needs evaluation with a specialist as may have something serious going on.

## 2018-02-10 NOTE — Telephone Encounter (Signed)
See prior note

## 2018-02-10 NOTE — Telephone Encounter (Signed)
Would not recommend repeat prednisone in light of swelling and given not long since last flare. Would recommend follow up with the back specialist.

## 2018-02-10 NOTE — Telephone Encounter (Signed)
Fax received and placed on Dr Kim's desk. 

## 2018-02-19 ENCOUNTER — Ambulatory Visit: Payer: Medicare Other | Admitting: Family Medicine

## 2018-02-19 ENCOUNTER — Encounter: Payer: Self-pay | Admitting: Family Medicine

## 2018-02-19 VITALS — BP 124/78 | HR 71 | Temp 97.7°F | Ht 71.0 in | Wt >= 6400 oz

## 2018-02-19 DIAGNOSIS — F3342 Major depressive disorder, recurrent, in full remission: Secondary | ICD-10-CM | POA: Diagnosis not present

## 2018-02-19 DIAGNOSIS — M545 Low back pain, unspecified: Secondary | ICD-10-CM

## 2018-02-19 DIAGNOSIS — I700 Atherosclerosis of aorta: Secondary | ICD-10-CM

## 2018-02-19 DIAGNOSIS — Z23 Encounter for immunization: Secondary | ICD-10-CM

## 2018-02-19 DIAGNOSIS — I5040 Unspecified combined systolic (congestive) and diastolic (congestive) heart failure: Secondary | ICD-10-CM

## 2018-02-19 DIAGNOSIS — G4733 Obstructive sleep apnea (adult) (pediatric): Secondary | ICD-10-CM

## 2018-02-19 DIAGNOSIS — J449 Chronic obstructive pulmonary disease, unspecified: Secondary | ICD-10-CM

## 2018-02-19 LAB — BASIC METABOLIC PANEL
BUN: 11 mg/dL (ref 6–23)
CHLORIDE: 105 meq/L (ref 96–112)
CO2: 29 mEq/L (ref 19–32)
Calcium: 8.9 mg/dL (ref 8.4–10.5)
Creatinine, Ser: 0.89 mg/dL (ref 0.40–1.50)
GFR: 108.96 mL/min (ref >60.00)
Glucose, Bld: 88 mg/dL (ref 70–99)
POTASSIUM: 4.5 meq/L (ref 3.5–5.1)
SODIUM: 139 meq/L (ref 135–145)

## 2018-02-19 LAB — CBC
HEMATOCRIT: 43.9 % (ref 39.0–52.0)
HEMOGLOBIN: 14.1 g/dL (ref 13.0–17.0)
MCHC: 32 g/dL (ref 30.0–36.0)
MCV: 85 fl (ref 78.0–100.0)
PLATELETS: 243 10*3/uL (ref 150.0–400.0)
RBC: 5.17 Mil/uL (ref 4.22–5.81)
RDW: 14.8 % (ref 11.5–15.5)
WBC: 8.4 10*3/uL (ref 4.0–10.5)

## 2018-02-19 NOTE — Addendum Note (Signed)
Addended by: Agnes Lawrence on: 02/19/2018 10:36 AM   Modules accepted: Orders

## 2018-02-19 NOTE — Progress Notes (Signed)
HPI:  Using dictation device. Unfortunately this device frequently misinterprets words/phrases.  Derrick Mata is a pleasant 67 year old with a past medical history significant for severe obesity, CdHF, COPD with emphysema, dependent edema, hyperglycemia, aortic atherosclerosis, tobacco use, acid reflux, hepatitis C, low back pain with degenerative disc disorder -referred to back specialist earlier this year [facility] neurosurgery) and poor compliance here for an acute visit swelling in the legs and recurrence of his low back pain. He is set up with a visit his cardiologist for the swelling in his history of diastolic heart failure.  Reports he had some increased swelling in the feet, but he knows it is from dietary indiscretion.  Reports he was eating entire bags of regular meds and candy.  He cut back on this and took some extra doses of his Lasix and now reports he is doing better.  He denies any chest pain, shortness of breath or dyspnea.  He had a flare in his back pain after bending down to pick something up.  His low back pain and severe.  He also called his back specialist about the back issues but they told him he could come in if he went to pay money but that there was not much they could do.  He took 2 Aleve once daily for a few days and now that pain has resolved. Due for flu shot.  Morbid Obesity/CdCHF/Dependant Edema/Hyperglycemia/Aortic atherosclerosis: -reports has lost >100lbs in the past- used to weigh >500lbs -soda and bread are his weakness per his report -no regular exercise, reports bad OA of the knees -eval with cardiology in 2014 -meds:asa, lasix 40mg  bid, potassium chloride  Tobacco Use/COPD/emphysemaOSA: -1ppd for > 30 years -denies: SOB, DOE -reports not interested in quitting, aware of risks -he did lung cancer screening with Eric Form 06/2017 -referred to pulmonology by prior PCP -referred to sleep specialist, refused to wear CPAP -reports he sleeps fine  in a recliner without any snoring since sleeping upright  Hx of Depression, Anxiety, hx substance abuse and Insomnia/OSA: -meds: wellbutrin 150mg  daily and trazadone 150 mg nightly started by prior PCP -reports depression and anxiety well controlled for many years -reported at initial visit, last bout with drugs and alcohol was in 2012 - when lost relative -used cocaine early in life, in [facility] x2 remotely  GERD:  -resolved  ED: -on viagra; startedwith prior PCP  Hepatitis C/Cirrhosis: -noncompliant history -finally completed Harvoni in 2017 -managed in hepatology clinic  OA: -referred to ortho in the past -uses diclofenac topical - advised of risks/interactions, lidocaine top   ROS: See pertinent positives and negatives per HPI.  Past Medical History:  Diagnosis Date  . Arthritis    OA of knees  . CHF (congestive heart failure) ([place])   . Closed compression fracture of L1 lumbar vertebra 09/03/2016  . Colon polyp   . Complication of anesthesia    slow to wake up  . COPD (chronic obstructive pulmonary disease) (HCC)    > 30 pack yr smoker  . Depression    Anxiety and Insomnia  . Diverticulosis   . Erectile dysfunction   . GERD (gastroesophageal reflux disease)   . Hemorrhoids   . Hep C w/o coma, chronic ([place]) 06/12/2015   treated with medications  . Leg edema   . Morbid obesity ([place])   . Sleep apnea    refuses CPAP  . Sleep apnea   . Substance abuse ([place])    hx alcohol and cocaine use, s/p fellowship  hall tx several times, 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    Past Surgical History:  Procedure Laterality Date  . COLONOSCOPY  04/18/2011   Procedure: COLONOSCOPY;  Surgeon: Scarlette Shorts, MD;  [facility];  Service: Endoscopy;  Laterality: N/A;  . COLONOSCOPY WITH PROPOFOL N/A 05/07/2016   Procedure: COLONOSCOPY WITH PROPOFOL;  Surgeon: Irene Shipper, MD;  [facility];  Service: Endoscopy;  Laterality: N/A;  . COLONOSCOPY WITH PROPOFOL  N/A 08/12/2017   Procedure: COLONOSCOPY WITH PROPOFOL;  Surgeon: Irene Shipper, MD;  [facility];  Service: Endoscopy;  Laterality: N/A;  . ESOPHAGOGASTRODUODENOSCOPY (EGD) WITH PROPOFOL N/A 05/07/2016   Procedure: ESOPHAGOGASTRODUODENOSCOPY (EGD) WITH PROPOFOL;  Surgeon: Irene Shipper, MD;  [facility];  Service: Endoscopy;  Laterality: N/A;  . KNEE ARTHROSCOPY  2011   left  . [place], 2010  . TIBIA FRACTURE SURGERY  2006   hardware  . TONSILLECTOMY  1963    Family History  Problem Relation Age of Onset  . Colon cancer Sister   . Cancer Sister 35       sister, colon   . Breast cancer Mother   . Cancer Mother        breast  . Heart disease Other   . Heart disease Father 52  . Stomach cancer Neg Hx   . Rectal cancer Neg Hx     SOCIAL HX: See HPI   Current Outpatient Medications:  .  aspirin EC 81 MG tablet, Take 81 mg by mouth at bedtime. , Disp: , Rfl:  .  buPROPion (WELLBUTRIN XL) 150 MG 24 hr tablet, Take 1 tablet (150 mg total) by mouth daily., Disp: 90 tablet, Rfl: 3 .  diclofenac sodium (VOLTAREN) 1 % GEL, Apply 2 g topically 2 (two) times daily., Disp: 300 g, Rfl: 3 .  furosemide (LASIX) 40 MG tablet, Take 1 tablet (40 mg total) by mouth daily., Disp: 90 tablet, Rfl: 3 .  Multiple Vitamin (MULTIVITAMIN WITH MINERALS) TABS tablet, Take 1 tablet by mouth daily., Disp: , Rfl:  .  omeprazole (PRILOSEC) 20 MG capsule, Take 20 mg by mouth daily., Disp: , Rfl:  .  potassium chloride SA (K-DUR,KLOR-CON) 20 MEQ tablet, TAKE 1 TABLET BY MOUTH DAILY (Patient taking differently: Take 20 mEq by mouth daily. ), Disp: 90 tablet, Rfl: 3 .  traZODone (DESYREL) 150 MG tablet, Take 1 tablet (150 mg total) by mouth at bedtime., Disp: 90 tablet, Rfl: 3  EXAM:  Vitals:   02/19/18 0945  BP: 124/78  Pulse: 71  Temp: 97.7 F (36.5 C)  SpO2: 97%    Body mass index is 58.4 kg/m.  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, unspecified congestive heart failure chronicity (HCC) - Plan: Basic metabolic panel, CBC  Low back pain without sciatica, unspecified back pain laterality, unspecified chronicity  Morbid obesity (HCC)  Recurrent major depressive disorder, in full remission ([place])  Aortic atherosclerosis (HCC)  OSA and  COPD overlap syndrome (HCC)  -Lungs sound clear today, O2 levels are good today, not a lot of swelling in his legs compared in the past -Recommended a healthy diet, watching his weight and swelling, advised to take Lasix twice daily for 3 days if he notices increased swelling or weight gain, advised prompt follow-up here with his cardiologist persist despite this treatment or short of breath, he has an appointment set up with his cardiologist soon -Talked about his chronic back pain and ways to manage, he prefers to take some Aleve from time to time, he knows not to take it on a regular basis, if he has severe symptoms that persist or any radicular symptoms, weakness or numbness he would need to see back specialist  -Flu shot today -Check his kidney function and blood counts today -Continues to refuse CPAP or other treatments for his OSA -Due for Medicare annual wellness visit, he can schedule this with her health coach as he leaves today -Otherwise follow-up in 4 months, sooner as needed -Patient advised to return or notify a  doctor immediately if symptoms worsen or persist or new concerns arise.  Patient Instructions  BEFORE YOU LEAVE: -flu shot - high dose if we have it -lab -follow up: Medicare wellness with Manuela Schwartz in next 1-2 months; follow up Dr. Maudie Mercury in 4 months  See the cardiologist as planned.  Monitor swelling and weight and take you lasix twice daily for 3 days if gaining weight or swelling. Seek care promptly if any persistent issues, trouble breathing or other concerns.  Eat a healthy low sugar diet. Avoid tobacco and smoking.  We have ordered labs or studies at this visit. It can take up to 1-2 weeks for results and processing. IF results require follow up or explanation, we will call you with instructions. Clinically stable results will be released to your [facility]. If you have not heard from [place] or cannot find your results in [facility] in 2 weeks please contact our office at [phone].  If you are not yet signed up for [facility], please consider signing up.           Lucretia Kern, DO

## 2018-02-19 NOTE — Patient Instructions (Addendum)
BEFORE YOU LEAVE: -flu shot - high dose if we have it -lab -follow up: Medicare wellness with Manuela Schwartz in next 1-2 months; follow up Dr. Maudie Mercury in 4 months  See the cardiologist as planned.  Monitor swelling and weight and take you lasix twice daily for 3 days if gaining weight or swelling. Seek care promptly if any persistent issues, trouble breathing or other concerns.  Eat a healthy low sugar diet. Avoid tobacco and smoking.  We have ordered labs or studies at this visit. It can take up to 1-2 weeks for results and processing. IF results require follow up or explanation, we will call you with instructions. Clinically stable results will be released to your [facility]. If you have not heard from [place] or cannot find your results in [facility] in 2 weeks please contact our office at [phone].  If you are not yet signed up for [facility], please consider signing up.

## 2018-03-05 ENCOUNTER — Ambulatory Visit: Payer: Medicare Other | Admitting: Family Medicine

## 2018-03-05 ENCOUNTER — Telehealth: Payer: Self-pay | Admitting: *Deleted

## 2018-03-05 NOTE — Telephone Encounter (Signed)
I called the pt and he stated he was not sure why this appt was scheduled and he is doing better.  Appt cancelled and message forwarded to the office manager in case a fee is charged.

## 2018-03-05 NOTE — Telephone Encounter (Signed)
-----   Message from Lucretia Kern, DO sent at 03/04/2018  9:52 PM EDT ----- I think appt today is in error. Please see if it is and cancel if so with no NO SHOW fee. Looks like the front did not cancel this one when we saw him recently.

## 2018-03-09 NOTE — Progress Notes (Deleted)
Maurice Small, DO Reason for referral-edema  HPI: 67 year old male for evaluation of edema at request of Lucretia Kern, DO.  Previously seen by Dr. Johnsie Cancel last in May 2014.  Echocardiogram June 2014 showed normal LV function, grade 1 diastolic dysfunction and mild biatrial enlargement.  Current Outpatient Medications  Medication Sig Dispense Refill  . aspirin EC 81 MG tablet Take 81 mg by mouth at bedtime.     Marland Kitchen buPROPion (WELLBUTRIN XL) 150 MG 24 hr tablet Take 1 tablet (150 mg total) by mouth daily. 90 tablet 3  . diclofenac sodium (VOLTAREN) 1 % GEL Apply 2 g topically 2 (two) times daily. 300 g 3  . furosemide (LASIX) 40 MG tablet Take 1 tablet (40 mg total) by mouth daily. 90 tablet 3  . Multiple Vitamin (MULTIVITAMIN WITH MINERALS) TABS tablet Take 1 tablet by mouth daily.    Marland Kitchen omeprazole (PRILOSEC) 20 MG capsule Take 20 mg by mouth daily.    . potassium chloride SA (K-DUR,KLOR-CON) 20 MEQ tablet TAKE 1 TABLET BY MOUTH DAILY (Patient taking differently: Take 20 mEq by mouth daily. ) 90 tablet 3  . traZODone (DESYREL) 150 MG tablet Take 1 tablet (150 mg total) by mouth at bedtime. 90 tablet 3   No current facility-administered medications for this visit.     Allergies  Allergen Reactions  . Penicillins Anaphylaxis, Nausea Only and Other (See Comments)    Passed out. Has patient had a PCN reaction causing immediate rash, facial/tongue/throat swelling, SOB or lightheadedness with hypotension: {no Has patient had a PCN reaction causing severe rash involving mucus membranes or skin necrosis: {no Has patient had a PCN reaction that required hospitalization {no Has patient had a PCN reaction occurring within the last 10 years: {no If all of the above answers are "NO", then may proceed with Cephalosporin use.  . Sulfamethoxazole Hives    Past Medical History:  Diagnosis Date  . Arthritis    OA of knees  . CHF (congestive heart failure) ([place])   . Closed compression  fracture of L1 lumbar vertebra 09/03/2016  . Colon polyp   . Complication of anesthesia    slow to wake up  . COPD (chronic obstructive pulmonary disease) ([facility])    > 30 pack yr smoker  . Depression    Anxiety and Insomnia  . Diverticulosis   . Erectile dysfunction   . GERD (gastroesophageal reflux disease)   . Hemorrhoids   . Hep C w/o coma, chronic ([place]) 06/12/2015   treated with medications  . Leg edema   . Morbid obesity ([place])   . Sleep apnea    refuses CPAP  . Sleep apnea   . 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 COLONOSCOPY  04/18/2011   Procedure: COLONOSCOPY;  Surgeon: Scarlette Shorts, MD;  Location: [facility];  Service: Endoscopy;  Laterality: N/A;  . COLONOSCOPY WITH PROPOFOL N/A 05/07/2016   Procedure: COLONOSCOPY WITH PROPOFOL;  Surgeon: Irene Shipper, MD;  Location: [facility];  Service: Endoscopy;  Laterality: N/A;  . COLONOSCOPY WITH PROPOFOL N/A 08/12/2017   Procedure: COLONOSCOPY WITH PROPOFOL;  Surgeon: Irene Shipper, MD;  Location: [facility];  Service: Endoscopy;  Laterality: N/A;  . ESOPHAGOGASTRODUODENOSCOPY (EGD) WITH PROPOFOL N/A 05/07/2016   Procedure: ESOPHAGOGASTRODUODENOSCOPY (EGD) WITH PROPOFOL;  Surgeon: Irene Shipper, MD;  [facility]: [facility];  Service: Endoscopy;  Laterality: N/A;  . KNEE ARTHROSCOPY  2011   left  . [place], 2010  . TIBIA FRACTURE SURGERY  2006   hardware  . TONSILLECTOMY  1963    Social History   Socioeconomic History  . Marital status: Married    Spouse name: Not on file  . Number of children: Not on file  . Years of education: Not on file  . Highest education level: Not on file  Occupational History  . Occupation: retired  Scientific laboratory technician  . Financial resource strain: Not on file  . Food insecurity:    Worry: Not on file    Inability: Not on file  . Transportation  needs:    Medical: Not on file    Non-medical: Not on file  Tobacco Use  . Smoking status: Current Every Day Smoker    Packs/day: 1.00    Years: 51.00    Pack years: 51.00    Types: Cigarettes    Start date: 05/21/1963  . Smokeless tobacco: Never Used  . Tobacco comment: not ready to quit yet  Substance and Sexual Activity  . Alcohol use: No    Alcohol/week: 0.0 standard drinks  . Drug use: No    Comment: last dose November 2012  . Sexual activity: Yes    Partners: Female    Comment: states no alcohol in 3 weeks  Lifestyle  . Physical activity:    Days per week: Not on file    Minutes per session: Not on file  . Stress: Not on file  Relationships  . Social connections:    Talks on phone: Not on file    Gets together: Not on file    Attends religious service: Not on file    Active member of club or organization: Not on file    Attends meetings of clubs or organizations: Not on file    Relationship status: Not on file  . Intimate partner violence:    Fear of current or ex partner: Not on file    Emotionally abused: Not on file    Physically abused: Not on file    Forced sexual activity: Not on file  Other Topics Concern  . 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 Colon cancer Sister   . Cancer Sister 22       sister, colon   . Breast cancer Mother   . Cancer Mother        breast  . Heart disease Other   . Heart disease Father 65  . Stomach cancer Neg Hx   . Rectal cancer Neg Hx     ROS: no fevers or chills, productive cough, hemoptysis, dysphasia, odynophagia, melena, hematochezia, dysuria, hematuria, rash, seizure activity, orthopnea, PND,  pedal edema, claudication. Remaining systems are negative.  Physical Exam:   There were no vitals taken for this visit.  General:  Well developed/well nourished in NAD Skin warm/dry Patient not depressed No peripheral clubbing Back-normal HEENT-normal/normal eyelids Neck supple/normal carotid upstroke bilaterally; no bruits; no JVD; no thyromegaly  chest - CTA/ normal expansion CV - RRR/normal S1 and S2; no murmurs, rubs or gallops;  PMI nondisplaced Abdomen -NT/ND, no HSM, no mass, + bowel sounds, no bruit 2+ femoral pulses, no bruits Ext-no edema, chords, 2+ DP Neuro-grossly nonfocal  ECG - personally reviewed  A/P  1  Kirk Ruths, MD

## 2018-03-17 ENCOUNTER — Ambulatory Visit: Payer: Medicare Other | Admitting: Cardiology

## 2018-03-17 ENCOUNTER — Telehealth: Payer: Self-pay | Admitting: Family Medicine

## 2018-03-17 NOTE — Telephone Encounter (Signed)
FYI

## 2018-03-17 NOTE — Telephone Encounter (Signed)
Please let them know have advised increasing lasix to twice daily if having increased swelling/weight. He is seeing cardiologist in 2 days. Recommend healthy diet. Seek care in interim if further help needed.

## 2018-03-17 NOTE — Telephone Encounter (Signed)
Magda Paganini, RN Case Manager with [facility] called to report a patient weight gain of 5 lbs in 5 days. She says patient says he may have overdone it at the homecoming festivities and the [employer] this past weekend. She says patient reports no swelling, no SOB and he is taking his furosemide as ordered. She says he does have an appointment with Cardiology on Thursday. She says she mentioned to the patient about the wellness program [place] has with Dr. Smith Mince where the patient's weight is managed, he says he will go if his doctor recommends it.

## 2018-03-17 NOTE — Telephone Encounter (Signed)
Patient is aware and will follow up with cardiology.

## 2018-03-19 ENCOUNTER — Ambulatory Visit: Payer: Medicare Other | Admitting: Cardiology

## 2018-03-19 ENCOUNTER — Encounter: Payer: Self-pay | Admitting: Cardiology

## 2018-03-19 VITALS — BP 130/86 | HR 86 | Ht 71.0 in | Wt >= 6400 oz

## 2018-03-19 DIAGNOSIS — Z720 Tobacco use: Secondary | ICD-10-CM

## 2018-03-19 DIAGNOSIS — I5040 Unspecified combined systolic (congestive) and diastolic (congestive) heart failure: Secondary | ICD-10-CM

## 2018-03-19 DIAGNOSIS — R609 Edema, unspecified: Secondary | ICD-10-CM

## 2018-03-19 DIAGNOSIS — I10 Essential (primary) hypertension: Secondary | ICD-10-CM

## 2018-03-19 NOTE — Patient Instructions (Signed)
Medication Instructions:  Continue same medications If you need a refill on your cardiac medications before your next appointment, please call your pharmacy.   Lab work: None ordered   Testing/Procedures: Schedule Echocardiogram  Follow-Up: At [facility], you and your health needs are our priority.  As part of our continuing mission to provide you with exceptional heart care, we have created designated Provider Care Teams.  These Care Teams include your primary Cardiologist (physician) and Advanced Practice Providers (APPs -  Physician Assistants and Nurse Practitioners) who all work together to provide you with the care you need, when you need it. . Follow Up with Dr.Crenshaw in 1 year call 2 months before to schedule

## 2018-03-19 NOTE — Progress Notes (Signed)
Delsa Bern, DO Reason for referral-lower extremity edema  HPI: 66 year old male for evaluation of lower extremity edema at request of Colin Benton, DO.  Previously seen by Dr. Johnsie Cancel but not since May 2014.  Echocardiogram June 2014 showed normal LV function, mild diastolic dysfunction and mild biatrial enlargement.  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  Patient has dyspnea with activities but not at rest.  No orthopnea or PND.  Occasional mild pedal edema.  No chest pain or syncope.  Current Outpatient Medications  Medication Sig Dispense Refill  . aspirin EC 81 MG tablet Take 81 mg by mouth at bedtime.     Marland Kitchen buPROPion (WELLBUTRIN XL) 150 MG 24 hr tablet Take 1 tablet (150 mg total) by mouth daily. 90 tablet 3  . diclofenac sodium (VOLTAREN) 1 % GEL Apply 2 g topically 2 (two) times daily. 300 g 3  . furosemide (LASIX) 40 MG tablet Take 1 tablet (40 mg total) by mouth daily. 90 tablet 3  . Multiple Vitamin (MULTIVITAMIN WITH MINERALS) TABS tablet Take 1 tablet by mouth daily.    Marland Kitchen omeprazole (PRILOSEC) 20 MG capsule Take 20 mg by mouth daily.    . potassium chloride SA (K-DUR,KLOR-CON) 20 MEQ tablet TAKE 1 TABLET BY MOUTH DAILY (Patient taking differently: Take 20 mEq by mouth daily. ) 90 tablet 3  . traZODone (DESYREL) 150 MG tablet Take 1 tablet (150 mg total) by mouth at bedtime. 90 tablet 3   No current facility-administered medications for this visit.     Allergies  Allergen Reactions  . Penicillins Anaphylaxis, Nausea Only and Other (See Comments)    Passed out. Has patient had a PCN reaction causing immediate rash, facial/tongue/throat swelling, SOB or lightheadedness with hypotension: {no Has patient had a PCN reaction causing severe rash involving mucus membranes or skin necrosis: {no Has patient had a PCN reaction that required hospitalization {no Has patient had a PCN reaction occurring within the  last 10 years: {no If all of the above answers are "NO", then may proceed with Cephalosporin use.  . Sulfamethoxazole Hives     Past Medical History:  Diagnosis Date  . Arthritis    OA of knees  . CHF (congestive heart failure) ([place])   . Closed compression fracture of L1 lumbar vertebra 09/03/2016  . Colon polyp   . Complication of anesthesia    slow to wake up  . COPD (chronic obstructive pulmonary disease) ([facility])    > 30 pack yr smoker  . Depression    Anxiety and Insomnia  . Diverticulosis   . Erectile dysfunction   . GERD (gastroesophageal reflux disease)   . Hemorrhoids   . Hep C w/o coma, chronic ([place]) 06/12/2015   treated with medications  . Leg edema   . Morbid obesity ([place])   . Sleep apnea    refuses CPAP  . 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 COLONOSCOPY  04/18/2011   Procedure: COLONOSCOPY;  Surgeon: Scarlette Shorts, MD;  [facility];  Service: Endoscopy;  Laterality: N/A;  . COLONOSCOPY WITH PROPOFOL N/A 05/07/2016   Procedure: COLONOSCOPY WITH PROPOFOL;  Surgeon: Irene Shipper, MD;  [facility];  Service: Endoscopy;  Laterality: N/A;  . COLONOSCOPY WITH PROPOFOL N/A 08/12/2017   Procedure: COLONOSCOPY WITH PROPOFOL;  Surgeon: Irene Shipper, MD;  Location: [facility];  Service: Endoscopy;  Laterality: N/A;  . ESOPHAGOGASTRODUODENOSCOPY (EGD) WITH PROPOFOL N/A 05/07/2016   Procedure: ESOPHAGOGASTRODUODENOSCOPY (EGD) WITH PROPOFOL;  Surgeon: Irene Shipper, MD;  [facility];  Service: Endoscopy;  Laterality: N/A;  . KNEE ARTHROSCOPY  2011   left  . [place], 2010  . TIBIA FRACTURE SURGERY  2006   hardware  . TONSILLECTOMY  1963    Social History   Socioeconomic History  . Marital status: Married    Spouse name: Not on file  . Number of children: 4  . Years of education: Not on  file  . Highest education level: Not on file  Occupational History  . Occupation: retired  Scientific laboratory technician  . Financial resource strain: Not on file  . Food insecurity:    Worry: Not on file    Inability: Not on file  . Transportation needs:    Medical: Not on file    Non-medical: Not on file  Tobacco Use  . Smoking status: Current Every Day Smoker    Packs/day: 1.00    Years: 51.00    Pack years: 51.00    Types: Cigarettes    Start date: 05/21/1963  . Smokeless tobacco: Never Used  . Tobacco comment: not ready to quit yet  Substance and Sexual Activity  . Alcohol use: No    Alcohol/week: 0.0 standard drinks  . Drug use: No    Comment: last dose November 2012  . Sexual activity: Yes    Partners: Female    Comment: states no alcohol in 3 weeks  Lifestyle  . Physical activity:    Days per week: Not on file    Minutes per session: Not on file  . Stress: Not on file  Relationships  . Social connections:    Talks on phone: Not on file    Gets together: Not on file    Attends religious service: Not on file    Active member of club or organization: Not on file    Attends meetings of clubs or organizations: Not on file    Relationship status: Not on file  . Intimate partner violence:    Fear of current or ex partner: Not on file    Emotionally abused: Not on file    Physically abused: Not on file    Forced sexual activity: Not on file  Other Topics Concern  . 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 Colon cancer Sister   . Cancer Sister 38       sister, colon   . Breast cancer Mother   . Cancer Mother        breast  . Heart disease Other   . Heart  disease Father 71  . Stomach cancer Neg Hx   . Rectal cancer Neg Hx     ROS: Knee arthralgias and back pain but no fevers or chills, productive cough, hemoptysis, dysphasia, odynophagia, melena, hematochezia, dysuria, hematuria, rash, seizure activity, orthopnea, PND, claudication. Remaining systems are negative.  Physical Exam:   Blood pressure 130/86, pulse 86, height 5\' 11"  (1.803 m), weight (!) 414 lb (187.8 kg).  General:  Well developed/morbidly obese in NAD Skin warm/dry Patient not depressed No peripheral clubbing Back-normal HEENT-normal/normal eyelids Neck supple/normal carotid upstroke bilaterally; no bruits; no JVD; no thyromegaly chest - CTA/ normal expansion CV - RRR/normal S1 and S2; no murmurs, rubs or gallops;  PMI nondisplaced Abdomen -NT/ND, no HSM, no mass, + bowel sounds, no bruit 2+ femoral pulses, no bruits Ext-trace edema, no chords, 2+ DP Neuro-grossly nonfocal  ECG -normal sinus rhythm at a rate of 86.  Cannot rule out prior anterior infarct.  Personally reviewed  A/P  1 edema-patient has a history of lower extremity edema.  He likely has a component of RV dysfunction secondary to obesity, obstructive sleep apnea and pulmonary hypertension.  We will arrange an echocardiogram to assess LV and RV function.  His edema is well controlled at present.  Continue present dose of Lasix.  2 morbid obesity-patient counseled on need for exercise, diet and weight loss.  3 Tobacco abuse-pt counseled on discontinuing.  4 history of diastolic congestive heart failure-repeat echocardiogram.  Continue present dose of Lasix.  We discussed low-sodium diet.  Kirk Ruths, MD

## 2018-03-23 ENCOUNTER — Ambulatory Visit (HOSPITAL_COMMUNITY): Payer: Medicare Other | Attending: Cardiovascular Disease

## 2018-03-23 ENCOUNTER — Other Ambulatory Visit: Payer: Self-pay

## 2018-03-23 DIAGNOSIS — I10 Essential (primary) hypertension: Secondary | ICD-10-CM | POA: Diagnosis present

## 2018-03-23 DIAGNOSIS — I5040 Unspecified combined systolic (congestive) and diastolic (congestive) heart failure: Secondary | ICD-10-CM | POA: Diagnosis present

## 2018-03-23 DIAGNOSIS — R609 Edema, unspecified: Secondary | ICD-10-CM | POA: Insufficient documentation

## 2018-03-26 NOTE — Progress Notes (Signed)
HPI: Follow-up edema and cardiomyopathy. Echocardiogram June 2014 showed normal LV function, mild diastolic dysfunction and mild biatrial enlargement.  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  Seen for edema October 2019.  Follow-up echocardiogram November 2019 showed ejection fraction 40 to 02% and mild diastolic dysfunction.  Biatrial enlargement.  Severe right ventricular enlargement.  Since last seen he has dyspnea with more extreme activities but not routine activities.  No orthopnea, PND or chest pain.  Mild pedal edema.  Current Outpatient Medications  Medication Sig Dispense Refill  . aspirin EC 81 MG tablet Take 81 mg by mouth at bedtime.     Marland Kitchen buPROPion (WELLBUTRIN XL) 150 MG 24 hr tablet Take 1 tablet (150 mg total) by mouth daily. 90 tablet 3  . diclofenac sodium (VOLTAREN) 1 % GEL Apply 2 g topically 2 (two) times daily. 300 g 3  . furosemide (LASIX) 40 MG tablet Take 1 tablet (40 mg total) by mouth daily. 90 tablet 3  . Multiple Vitamin (MULTIVITAMIN WITH MINERALS) TABS tablet Take 1 tablet by mouth daily.    Marland Kitchen omeprazole (PRILOSEC) 20 MG capsule Take 20 mg by mouth daily.    . potassium chloride SA (K-DUR,KLOR-CON) 20 MEQ tablet TAKE 1 TABLET BY MOUTH DAILY (Patient taking differently: Take 20 mEq by mouth daily. ) 90 tablet 3  . traZODone (DESYREL) 150 MG tablet Take 1 tablet (150 mg total) by mouth at bedtime. 90 tablet 3   No current facility-administered medications for this visit.      Past Medical History:  Diagnosis Date  . Arthritis    OA of knees  . CHF (congestive heart failure) ([place])   . Closed compression fracture of L1 lumbar vertebra 09/03/2016  . Colon polyp   . Complication of anesthesia    slow to wake up  . COPD (chronic obstructive pulmonary disease) ([facility])    > 30 pack yr smoker  . Depression    Anxiety and Insomnia  . Diverticulosis   . Erectile dysfunction   . GERD  (gastroesophageal reflux disease)   . Hemorrhoids   . Hep C w/o coma, chronic ([place]) 06/12/2015   treated with medications  . Leg edema   . Morbid obesity ([place])   . Sleep apnea    refuses CPAP  . Substance abuse ([place])    hx alcohol and cocaine use, 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, 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    Past Surgical History:  Procedure Laterality Date  . COLONOSCOPY  04/18/2011   Procedure: COLONOSCOPY;  Surgeon: Scarlette Shorts, MD;  Location: [facility];  Service: Endoscopy;  Laterality: N/A;  . COLONOSCOPY WITH PROPOFOL N/A 05/07/2016   Procedure: COLONOSCOPY WITH PROPOFOL;  Surgeon: Irene Shipper, MD;  Location: [facility];  Service: Endoscopy;  Laterality: N/A;  . COLONOSCOPY WITH PROPOFOL N/A 08/12/2017   Procedure: COLONOSCOPY WITH PROPOFOL;  Surgeon: Irene Shipper, MD;  [facility];  Service: Endoscopy;  Laterality: N/A;  . ESOPHAGOGASTRODUODENOSCOPY (EGD) WITH PROPOFOL N/A 05/07/2016   Procedure: ESOPHAGOGASTRODUODENOSCOPY (EGD) WITH PROPOFOL;  Surgeon: Irene Shipper, MD;  [facility];  Service: Endoscopy;  Laterality: N/A;  . KNEE ARTHROSCOPY  2011   left  . [place], 2010  . TIBIA FRACTURE SURGERY  2006   hardware  . TONSILLECTOMY  1963    Social History   Socioeconomic History  . Marital status: Married    Spouse  name: Not on file  . Number of children: 4  . Years of education: Not on file  . Highest education level: Not on file  Occupational History  . Occupation: retired  Scientific laboratory technician  . Financial resource strain: Not on file  . Food insecurity:    Worry: Not on file    Inability: Not on file  . Transportation needs:    Medical: Not on file    Non-medical: Not on file  Tobacco Use  . Smoking status: Current Every Day Smoker    Packs/day: 1.00    Years: 51.00    Pack years: 51.00    Types: Cigarettes    Start date: 05/21/1963  . Smokeless tobacco: Never Used  . Tobacco comment: not ready to quit yet    Substance and Sexual Activity  . Alcohol use: No    Alcohol/week: 0.0 standard drinks  . Drug use: No    Comment: last dose November 2012  . Sexual activity: Yes    Partners: Female    Comment: states no alcohol in 3 weeks  Lifestyle  . Physical activity:    Days per week: Not on file    Minutes per session: Not on file  . Stress: Not on file  Relationships  . Social connections:    Talks on phone: Not on file    Gets together: Not on file    Attends religious service: Not on file    Active member of club or organization: Not on file    Attends meetings of clubs or organizations: Not on file    Relationship status: Not on file  . Intimate partner violence:    Fear of current or ex partner: Not on file    Emotionally abused: Not on file    Physically abused: Not on file    Forced sexual activity: Not on file  Other Topics Concern  . 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 Colon cancer Sister   . Cancer Sister 65       sister, colon   . Breast cancer Mother   . Cancer Mother        breast  . Heart disease Other   . Heart disease Father 72  . Stomach cancer Neg Hx   . Rectal cancer Neg Hx     ROS: no fevers or chills, productive cough, hemoptysis, dysphasia, odynophagia, melena, hematochezia, dysuria, hematuria, rash, seizure activity, orthopnea, PND, pedal edema, claudication. Remaining systems are negative.  Physical Exam: Well-developed morbidly obese in no acute distress.  Skin is warm and dry.  HEENT is normal.  Neck is supple.  Chest is clear to auscultation with normal expansion.  Cardiovascular exam is regular rate and rhythm.   Abdominal exam nontender or distended. No masses palpated. Extremities show trace edema. neuro grossly intact   A/P  1 cardiomyopathy-etiology unclear.  He has a history of alcohol use but none since 2012.  He does not have a history of hypertension.  Check TSH.  Ischemia evaluation somewhat difficult due to patient size.  We will check weight limit for nuclear study and screen for ischemia with Lexiscan Myoview if possible.  Add losartan 25 mg daily and Toprol 25 mg daily.  Titrate medications as tolerated.  Check potassium and renal function in 1 week.  Plan repeat echocardiogram once medications titrated.  2 chronic combined systolic/diastolic congestive heart failure-volume status extremely difficult to assess due to obesity.  However symptoms are unchanged.  Continue present dose of Lasix.    3 lower extremity edema-patient has chronic lower extremity edema.  This is likely multifactorial including pulmonary venous hypertension, RV dysfunction secondary to obesity/obstructive sleep apnea.  Continue present dose of Lasix.  4 morbid obesity-we discussed the importance of diet, weight loss and exercise.  5 tobacco abuse-we discussed the importance of discontinuing.  Kirk Ruths, MD

## 2018-04-01 ENCOUNTER — Telehealth: Payer: Self-pay

## 2018-04-01 NOTE — Telephone Encounter (Signed)
-----   Message from Lelon Perla, MD sent at 03/23/2018  1:18 PM EST ----- Schedule fuov Kirk Ruths

## 2018-04-01 NOTE — Telephone Encounter (Signed)
2nd attempt to reach pt. Lmtcb and of appt date and time to discuss results.

## 2018-04-06 ENCOUNTER — Ambulatory Visit (INDEPENDENT_AMBULATORY_CARE_PROVIDER_SITE_OTHER): Payer: Medicare Other | Admitting: Cardiology

## 2018-04-06 ENCOUNTER — Encounter: Payer: Self-pay | Admitting: Cardiology

## 2018-04-06 VITALS — BP 148/94 | HR 75 | Ht 71.0 in | Wt >= 6400 oz

## 2018-04-06 DIAGNOSIS — I5040 Unspecified combined systolic (congestive) and diastolic (congestive) heart failure: Secondary | ICD-10-CM | POA: Diagnosis not present

## 2018-04-06 DIAGNOSIS — R609 Edema, unspecified: Secondary | ICD-10-CM

## 2018-04-06 DIAGNOSIS — I420 Dilated cardiomyopathy: Secondary | ICD-10-CM

## 2018-04-06 DIAGNOSIS — Z720 Tobacco use: Secondary | ICD-10-CM

## 2018-04-06 MED ORDER — LOSARTAN POTASSIUM 25 MG PO TABS: 25.0000 mg | ORAL_TABLET | Freq: Every day | ORAL | 3 refills | Status: DC

## 2018-04-06 MED ORDER — METOPROLOL SUCCINATE ER 25 MG PO TB24: 25.0000 mg | ORAL_TABLET | Freq: Every day | ORAL | 3 refills | Status: DC

## 2018-04-06 NOTE — Patient Instructions (Signed)
Medication Instructions:  START LOSARTAN 25 MG ONCE DAILY  START METOPROLOL SUCC 25 MG ONCE DAILY AT BEDTIME If you need a refill on your cardiac medications before your next appointment, please call your pharmacy.   Lab work: Your physician recommends that you return for lab work in: [place] If you have labs (blood work) drawn today and your tests are completely normal, you will receive your results only by: Marland Kitchen MyChart Message (if you have MyChart) OR . A paper copy in the mail If you have any lab test that is abnormal or we need to change your treatment, we will call you to review the results.  Testing/Procedures: Your physician has requested that you have a lexiscan myoview. For further information please visit [email or web address]. Please follow instruction sheet, as given.    Follow-Up: Your physician recommends that you schedule a follow-up appointment in: [place]

## 2018-04-07 ENCOUNTER — Encounter: Payer: Self-pay | Admitting: *Deleted

## 2018-04-07 ENCOUNTER — Other Ambulatory Visit: Payer: Self-pay | Admitting: *Deleted

## 2018-04-07 DIAGNOSIS — I5040 Unspecified combined systolic (congestive) and diastolic (congestive) heart failure: Secondary | ICD-10-CM

## 2018-04-07 DIAGNOSIS — I420 Dilated cardiomyopathy: Secondary | ICD-10-CM

## 2018-04-20 NOTE — Progress Notes (Deleted)
Subjective:   Derrick Mata is a 67 y.o. male who presents for Medicare Annual (Subsequent) preventive examination.  Reports health as 2018  Reports health as great now  Weight is 421 normally at home   Married x 40 years Lives one level home Bathroom is walk in shower  Diet  BS ok, A1c 5.8 09/2016 Eggs x 5 days a week Sometimes a cheese sandwich Eats in the am and midday and before 8pm Has big meal on Sundays Wife works and cooks on the weekend  Does not use salt  BMI 59 A1c 5.8 07/2016 Chol; wnl Was 553 lbs in 2003. Gained weight when he went off ETOH  Started back on alkaline water    Exercise daily routine  No exercise due to bad knees and back issues   Diabetic eye exam 03/2016   There are no preventive care reminders to display for this patient.  Colonoscopy 07/2017 PSA 08/2014   Need to document 2nd shingrix vaccine 03/2017 one of them      Objective:     Vitals: There were no vitals taken for this visit.  There is no height or weight on file to calculate BMI.  Advanced Directives 08/12/2017 01/03/2017 08/24/2016 05/07/2016 05/06/2016 04/18/2011  Does Patient Have a Medical Advance Directive? No No No No No Patient does not have advance directive  Would patient like information on creating a medical advance directive? No - Patient declined - No - Patient declined No - Patient declined No - Patient declined -  Pre-existing out of facility DNR order (yellow form or pink MOST form) - - - - - No    Tobacco Social History   Tobacco Use  Smoking Status Current Every Day Smoker  . Packs/day: 1.00  . Years: 51.00  . Pack years: 51.00  . Types: Cigarettes  . Start date: 05/21/1963  Smokeless Tobacco Never Used  Tobacco Comment   not ready to quit yet     Ready to quit: Not Answered Counseling given: Not Answered Comment: not ready to quit yet   Clinical Intake:     Past Medical History:  Diagnosis Date  . Arthritis    OA of knees  . CHF  (congestive heart failure) ([place])   . Closed compression fracture of L1 lumbar vertebra 09/03/2016  . Colon polyp   . Complication of anesthesia    slow to wake up  . COPD (chronic obstructive pulmonary disease) ([facility])    > 30 pack yr smoker  . Depression    Anxiety and Insomnia  . Diverticulosis   . Erectile dysfunction   . GERD (gastroesophageal reflux disease)   . Hemorrhoids   . Hep C w/o coma, chronic ([place]) 06/12/2015   treated with medications  . Leg edema   . Morbid obesity ([place])   . Sleep apnea    refuses CPAP  . 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 COLONOSCOPY  04/18/2011   Procedure: COLONOSCOPY;  Surgeon: Scarlette Shorts, MD;  [facility];  Service: Endoscopy;  Laterality: N/A;  . COLONOSCOPY WITH PROPOFOL N/A 05/07/2016   Procedure: COLONOSCOPY WITH PROPOFOL;  Surgeon: Irene Shipper, MD;  [facility];  Service: Endoscopy;  Laterality: N/A;  . COLONOSCOPY WITH PROPOFOL N/A 08/12/2017   Procedure: COLONOSCOPY WITH PROPOFOL;  Surgeon: Irene Shipper, MD;  [facility]  [facility];  Service: Endoscopy;  Laterality: N/A;  . ESOPHAGOGASTRODUODENOSCOPY (EGD) WITH PROPOFOL N/A 05/07/2016   Procedure: ESOPHAGOGASTRODUODENOSCOPY (EGD) WITH PROPOFOL;  Surgeon: Irene Shipper, MD;  [facility];  Service: Endoscopy;  Laterality: N/A;  . KNEE ARTHROSCOPY  2011   left  . [place], 2010  . TIBIA FRACTURE SURGERY  2006   hardware  . TONSILLECTOMY  1963   Family History  Problem Relation Age of Onset  . Colon cancer Sister   . Cancer Sister 78       sister, colon   . Breast cancer Mother   . Cancer Mother        breast  . Heart disease Other   . Heart disease Father 40  . Stomach cancer Neg Hx   . Rectal cancer Neg Hx    Social History   Socioeconomic History  . Marital status: Married    Spouse name:  Not on file  . Number of children: 4  . Years of education: Not on file  . Highest education level: Not on file  Occupational History  . Occupation: retired  Scientific laboratory technician  . Financial resource strain: Not on file  . Food insecurity:    Worry: Not on file    Inability: Not on file  . Transportation needs:    Medical: Not on file    Non-medical: Not on file  Tobacco Use  . Smoking status: Current Every Day Smoker    Packs/day: 1.00    Years: 51.00    Pack years: 51.00    Types: Cigarettes    Start date: 05/21/1963  . Smokeless tobacco: Never Used  . Tobacco comment: not ready to quit yet  Substance and Sexual Activity  . Alcohol use: No    Alcohol/week: 0.0 standard drinks  . Drug use: No    Comment: last dose November 2012  . Sexual activity: Yes    Partners: Female    Comment: states no alcohol in 3 weeks  Lifestyle  . Physical activity:    Days per week: Not on file    Minutes per session: Not on file  . Stress: Not on file  Relationships  . Social connections:    Talks on phone: Not on file    Gets together: Not on file    Attends religious service: Not on file    Active member of club or organization: Not on file    Attends meetings of clubs or organizations: Not on file    Relationship status: Not on file  Other Topics Concern  . 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 aspirin EC 81 MG tablet Take 81 mg by mouth at bedtime.   Marland Kitchen buPROPion (WELLBUTRIN XL) 150 MG 24 hr tablet Take 1 tablet (150 mg total) by mouth daily.  . diclofenac sodium (VOLTAREN) 1 % GEL Apply 2 g topically 2 (two) times daily.  . furosemide (LASIX) 40  MG tablet Take 1 tablet (40 mg total) by mouth daily.  Marland Kitchen losartan (COZAAR) 25 MG  tablet Take 1 tablet (25 mg total) by mouth daily.  . metoprolol succinate (TOPROL XL) 25 MG 24 hr tablet Take 1 tablet (25 mg total) by mouth daily.  . Multiple Vitamin (MULTIVITAMIN WITH MINERALS) TABS tablet Take 1 tablet by mouth daily.  Marland Kitchen omeprazole (PRILOSEC) 20 MG capsule Take 20 mg by mouth daily.  . potassium chloride SA (K-DUR,KLOR-CON) 20 MEQ tablet TAKE 1 TABLET BY MOUTH DAILY (Patient taking differently: Take 20 mEq by mouth daily. )  . traZODone (DESYREL) 150 MG tablet Take 1 tablet (150 mg total) by mouth at bedtime.   No facility-administered encounter medications on file as of 04/21/2018.     Activities of Daily Living No flowsheet data found.  Patient Care Team: Lucretia Kern, DO as PCP - General (Family Medicine) Comer, Okey Regal, MD as Consulting Physician (Infectious Diseases) Monna Fam, MD as Consulting Physician (Ophthalmology)    Assessment:   This is a routine wellness examination for Curlie.  Exercise Activities and Dietary recommendations    Goals    . patient     Wants to drink more water., Helps him with weight loss        Fall Risk Fall Risk  01/03/2017 04/04/2016 09/15/2015 07/19/2015 08/25/2014  Falls in the past year? No Yes Yes No No  Number falls in past yr: - 1 1 - -  Injury with Fall? - No No - -  Risk for fall due to : - History of fall(s);Impaired balance/gait - - -  Follow up - Falls evaluation completed;Education provided - - -     Depression Screen PHQ 2/9 Scores 01/03/2017 09/15/2015 07/19/2015 08/25/2014  PHQ - 2 Score 0 0 0 0     Cognitive Function     6CIT Screen 01/03/2017  What Year? 0 points  What month? 0 points  What time? 0 points  Count back from 20 0 points  Months in reverse 0 points  Repeat phrase 0 points  Total Score 0    Immunization History  Administered Date(s) Administered  . Hepatitis A, Adult 12/19/2015, 10/03/2016  .  Hepatitis B, adult 08/21/2015, 04/04/2016  . Hepatitis B, ped/adol 07/19/2015  . Influenza Split 03/28/2011  . Influenza Whole 06/23/2007, 02/15/2009, 01/31/2010  . Influenza, High Dose Seasonal PF 02/01/2016, 02/19/2018  . Influenza,inj,Quad PF,6+ Mos 01/12/2013, 02/25/2014  . Influenza-Unspecified 01/19/2015, 02/17/2017  . Pneumococcal Conjugate-13 09/08/2014  . Pneumococcal Polysaccharide-23 07/17/2015  . Zoster 02/08/2012  . Zoster Recombinat (Shingrix) 03/20/2017     Screening Tests Health Maintenance  Topic Date Due  . TETANUS/TDAP  06/11/2025  . COLONOSCOPY  08/13/2027  . INFLUENZA VACCINE  Completed  . Hepatitis C Screening  Completed  . PNA vac Low Risk Adult  Completed         Plan:      PCP Notes ***  Health Maintenance ***  Abnormal Screens  ***  Referrals  ***  Patient concerns; ***  Nurse Concerns; ***  Next PCP apt ***      I have personally reviewed and noted the following in the patient's chart:   . Medical and social history . Use of alcohol, tobacco or illicit drugs  . Current medications and supplements . Functional ability and status . Nutritional status . Physical activity . Advanced directives . List of other physicians . Hospitalizations, surgeries, and ER visits in previous 12 months . Vitals . Screenings to include cognitive, depression, and falls . Referrals and appointments  In addition, I have reviewed and  discussed with patient certain preventive protocols, quality metrics, and best practice recommendations. A written personalized care plan for preventive services as well as general preventive health recommendations were provided to patient.     Wynetta Fines, RN  04/20/2018

## 2018-04-21 ENCOUNTER — Ambulatory Visit: Payer: Medicare Other

## 2018-05-07 ENCOUNTER — Encounter: Payer: Self-pay | Admitting: *Deleted

## 2018-05-08 LAB — BASIC METABOLIC PANEL
BUN / CREAT RATIO: 9 — AB (ref 10–24)
BUN: 9 mg/dL (ref 8–27)
CO2: 25 mmol/L (ref 20–29)
CREATININE: 1.04 mg/dL (ref 0.76–1.27)
Calcium: 8.8 mg/dL (ref 8.6–10.2)
Chloride: 103 mmol/L (ref 96–106)
GFR, EST AFRICAN AMERICAN: 84 mL/min/{1.73_m2} (ref >59)
GFR, EST NON AFRICAN AMERICAN: 73 mL/min/{1.73_m2} (ref >59)
GLUCOSE: 87 mg/dL (ref 65–99)
Potassium: 4.2 mmol/L (ref 3.5–5.2)
SODIUM: 142 mmol/L (ref 134–144)

## 2018-05-08 LAB — TSH: TSH: 1.89 u[IU]/mL (ref 0.450–4.500)

## 2018-05-21 ENCOUNTER — Inpatient Hospital Stay (HOSPITAL_COMMUNITY): Admission: RE | Admit: 2018-05-21 | Payer: Medicare Other | Source: Ambulatory Visit

## 2018-05-22 ENCOUNTER — Ambulatory Visit (HOSPITAL_COMMUNITY): Payer: Medicare Other

## 2018-05-25 ENCOUNTER — Telehealth (HOSPITAL_COMMUNITY): Payer: Self-pay | Admitting: *Deleted

## 2018-05-25 NOTE — Telephone Encounter (Signed)
Left message on voicemail in reference to upcoming appointment scheduled for 05/27/17 Phone number given for a call back so details instructions can be given.  Derrick Mata

## 2018-05-26 ENCOUNTER — Telehealth (HOSPITAL_COMMUNITY): Payer: Self-pay | Admitting: *Deleted

## 2018-05-26 NOTE — Telephone Encounter (Signed)
Patient given detailed instructions per Myocardial Perfusion Study Information Sheet for the test on 05/27/18 at 0815. Patient notified to arrive 15 minutes early and that it is imperative to arrive on time for appointment to keep from having the test rescheduled.  If you need to cancel or reschedule your appointment, please call the office within 24 hours of your appointment. . Patient verbalized understanding.Klynn Linnemann, [place]e [place]

## 2018-05-26 NOTE — Telephone Encounter (Signed)
Left message on voicemail in reference to upcoming appointment scheduled for 05/27/18. Phone number given for a call back so details instructions can be given. Artavis Cowie, [place]

## 2018-05-27 ENCOUNTER — Ambulatory Visit (HOSPITAL_COMMUNITY): Payer: Medicare Other | Attending: Cardiovascular Disease

## 2018-05-27 DIAGNOSIS — I5040 Unspecified combined systolic (congestive) and diastolic (congestive) heart failure: Secondary | ICD-10-CM | POA: Diagnosis present

## 2018-05-27 DIAGNOSIS — I420 Dilated cardiomyopathy: Secondary | ICD-10-CM | POA: Diagnosis present

## 2018-05-27 MED ORDER — REGADENOSON 0.4 MG/5ML IV SOLN
0.4000 mg | Freq: Once | INTRAVENOUS | Status: AC
  Administered 2018-05-27: 0.4 mg via INTRAVENOUS

## 2018-05-27 MED ORDER — TECHNETIUM TC 99M TETROFOSMIN IV KIT
31.3000 | PACK | Freq: Once | INTRAVENOUS | Status: AC | PRN
  Administered 2018-05-27: 31.3 via INTRAVENOUS
  Filled 2018-05-27: qty 32

## 2018-05-28 ENCOUNTER — Ambulatory Visit (HOSPITAL_COMMUNITY): Payer: Medicare Other | Attending: Cardiovascular Disease

## 2018-05-28 LAB — MYOCARDIAL PERFUSION IMAGING
LV dias vol: 174 mL (ref 62–150)
LV sys vol: 93 mL
Peak HR: 78 {beats}/min
Rest HR: 66 {beats}/min
SDS: 2
SRS: 0
SSS: 2
TID: 0.97

## 2018-05-28 MED ORDER — TECHNETIUM TC 99M TETROFOSMIN IV KIT
32.3000 | PACK | Freq: Once | INTRAVENOUS | Status: AC | PRN
  Administered 2018-05-28: 32.3 via INTRAVENOUS
  Filled 2018-05-28: qty 33

## 2018-06-16 ENCOUNTER — Other Ambulatory Visit: Payer: Self-pay | Admitting: Family Medicine

## 2018-06-20 NOTE — Progress Notes (Signed)
HPI:  Using dictation device. Unfortunately this device frequently misinterprets words/phrases.  Derrick Mata is a pleasant 68 y.o. here for follow up. Chronic medical problems summarized below were reviewed for changes and stability and were updated as needed below.  Cardiologist did lexiscan stress test recently - no ischemia, EF 47%. Cardiologist stated metoprolol, losartan and continued lasix and he has follow up with his cardiologist later this month. He is scheduled for annual lung cancer screening with Eric Form later this month as well, but reports has not received a call about this.. Denies CP, SOB, DOE, treatment intolerance or new symptoms. He had BMP and TSH with his cardiologist in December.  Due for AWV at next visit.  CdCHF/Dependant Edema/Aortic atherosclerosis: -eval with cardiology in 2014 and re-referred in 2019 -s/p lexiscan stress test 05/2018 with EF 47%, no ischemia per cardiology notes -meds:asa, lasix 40mg  bid, potassium chloride; losartan and metoprolol added by cardiology 03/2018  Morbid Obesity/Hyperglycemia: -reports has lost >100lbs in the past- used to weigh >500lbs -soda and breadarehis weakness per his report -no regular exercise, reports bad OA of the knees  Tobacco Use/COPD/emphysema/OSA: -1ppd for > 30 years -denies: SOB, DOE -reports not interested in quitting, aware of risks -he did lung cancer screening with Eric Form 06/2017 -referred to pulmonology by prior PCP -referred to sleep specialist, refused to wear CPAP -reports he sleeps fine in a recliner without any snoring since sleeping upright  Hx of Depression, Anxiety, hx substance abuse and Insomnia/OSA: -meds: wellbutrin 150mg  daily and trazadone 150 mg nightly started by prior PCP -reports depression and anxiety well controlled for many years -reported at initial visit, last bout with drugs and alcohol was in 2012 - when lost relative -used cocaine early in life, in fellowship  hall x2 remotely  GERD:  -resolved  ED: -on viagra; startedwith prior PCP  Hepatitis C/Cirrhosis: -noncompliant history -finally completed [facility] in 2017 -managed in hepatology clinic  OA: -referred to ortho in the past -uses diclofenac topical - advised of risks/interactions, lidocaine top   ROS: See pertinent positives and negatives per HPI.  Past Medical History:  Diagnosis Date  . Arthritis    OA of knees  . CHF (congestive heart failure) ([place])   . Closed compression fracture of L1 lumbar vertebra 09/03/2016  . Colon polyp   . Complication of anesthesia    slow to wake up  . COPD (chronic obstructive pulmonary disease) (H[facility])    > 30 pack yr smoker  . Depression    Anxiety and Insomnia  . Diverticulosis   . Erectile dysfunction   . GERD (gastroesophageal reflux disease)   . Hemorrhoids   . Hep C w/o coma, chronic ([place]) 06/12/2015   treated with medications  . Leg edema   . Morbid obesity ([name])   . Sleep apnea    refuses CPAP  . Substance abuse ([place])    hx alcohol and cocaine use, 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, 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    Past Surgical History:  Procedure Laterality Date  . COLONOSCOPY  04/18/2011   Procedure: COLONOSCOPY;  Surgeon: Scarlette Shorts, MD;  Location: [facility];  Service: Endoscopy;  Laterality: N/A;  . COLONOSCOPY WITH PROPOFOL N/A 05/07/2016   Procedure: COLONOSCOPY WITH PROPOFOL;  Surgeon: Irene Shipper, MD;  Location: [facility];  Service: Endoscopy;  Laterality: N/A;  . COLONOSCOPY WITH PROPOFOL N/A 08/12/2017   Procedure: COLONOSCOPY WITH PROPOFOL;  Surgeon: Irene Shipper, MD;  [facility]: [facility];  Service:  Endoscopy;  Laterality: N/A;  . ESOPHAGOGASTRODUODENOSCOPY (EGD) WITH PROPOFOL N/A 05/07/2016   Procedure: ESOPHAGOGASTRODUODENOSCOPY (EGD) WITH PROPOFOL;  Surgeon: Irene Shipper, MD;  [facility];  Service: Endoscopy;  Laterality: N/A;  . KNEE ARTHROSCOPY  2011   left  . [place], 2010  . TIBIA FRACTURE SURGERY  2006   hardware  . TONSILLECTOMY  1963    Family History  Problem Relation Age of Onset  . Colon cancer Sister   . Cancer Sister 3       sister, colon   . Breast cancer Mother   . Cancer Mother        breast  . Heart disease Other   . Heart disease Father 58  . Stomach cancer Neg Hx   . Rectal cancer Neg Hx     SOCIAL HX: se ehpi   Current Outpatient Medications:  .  aspirin EC 81 MG tablet, Take 81 mg by mouth at bedtime. , Disp: , Rfl:  .  buPROPion (WELLBUTRIN XL) 150 MG 24 hr tablet, Take 1 tablet (150 mg total) by mouth daily., Disp: 90 tablet, Rfl: 3 .  diclofenac sodium (VOLTAREN) 1 % GEL, Apply 2 g topically 2 (two) times daily., Disp: 300 g, Rfl: 3 .  furosemide (LASIX) 40 MG tablet, Take 1 tablet (40 mg total) by mouth daily., Disp: 90 tablet, Rfl: 3 .  losartan (COZAAR) 25 MG tablet, Take 1 tablet (25 mg total) by mouth daily., Disp: 90 tablet, Rfl: 3 .  metoprolol succinate (TOPROL XL) 25 MG 24 hr tablet, Take 1 tablet (25 mg total) by mouth daily., Disp: 90 tablet, Rfl: 3 .  Multiple Vitamin (MULTIVITAMIN WITH MINERALS) TABS tablet, Take 1 tablet by mouth daily., Disp: , Rfl:  .  omeprazole (PRILOSEC) 20 MG capsule, Take 20 mg by mouth daily., Disp: , Rfl:  .  potassium chloride SA (K-DUR,KLOR-CON) 20 MEQ tablet, TAKE 1 TABLET BY MOUTH DAILY (Patient taking differently: Take 20 mEq by mouth daily. ), Disp: 90 tablet, Rfl: 3 .  traZODone (DESYREL) 150 MG tablet, TAKE 1 TABLET(150 MG) BY MOUTH AT BEDTIME, Disp: 90 tablet, Rfl: 3  EXAM:  Vitals:   06/22/18 1030  BP: 128/80  Pulse: 61  Temp: 98.4 F (36.9 C)    Body mass index is 58.17 kg/m.  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, unspecified congestive heart failure chronicity (HCC)  Super obesity  Obesity hypoventilation syndrome (HCC)  Essential hypertension  Aortic atherosclerosis (HCC)  Recurrent major depressive disorder, in full remission (HCC)  OSA and COPD overlap syndrome (HCC)  Moderate smoker (20 or less per day)  OSA (obstructive sleep apnea)  Noncompliance with CPAP treatment  -seeing cardiology, BP ok today discussed goals -he reports was not aware had elevated bp or hyperglycemia - advised tx per cardiology, wt reduction, low sugar diet - reviewed moa, reason for the meds added by cardiology as he had a lot of  questions about this -sees sarah gross about smoking and lung cancer screening - we gave him her number to call to check on next visit -awv/labs next visit -lifestyle recs per below -Patient advised to return or notify a doctor immediately if symptoms worsen or persist or new concerns arise.  Patient Instructions  BEFORE YOU LEAVE: -number for lung cancer screening clinic -phq9 in epic -follow up: AWV and follow up in 3 months - come fasting  Call the lung cancer screening clinic - it looks like the CT has been ordered.   We recommend the following healthy lifestyle for LIFE: 1) Small portions. But, make sure to get regular (at least 3 per day), healthy meals and small healthy snacks if needed.  2) Eat a healthy clean diet.   TRY TO EAT: -at least 5-7 servings of low sugar, colorful, and nutrient rich vegetables per day (not corn, potatoes or bananas.) -berries are the best choice if you wish to eat  fruit (only eat small amounts if trying to reduce weight)  -lean meets (fish, white meat of chicken or [place]) -vegan proteins for some meals - beans or tofu, whole grains, nuts and seeds -Replace bad fats with good fats - good fats include: fish, nuts and seeds, canola oil, olive oil -small amounts of low fat or non fat dairy -small amounts of100 % whole grains - check the lables -drink plenty of water  AVOID: -SUGAR, sweets, anything with added sugar, corn syrup or sweeteners - must read labels as even foods advertised as "healthy" often are loaded with sugar -if you must have a sweetener, small amounts of stevia may be best -sweetened beverages and artificially sweetened beverages -simple starches (rice, bread, potatoes, pasta, chips, etc - small amounts of 100% whole grains are ok) -red meat, pork, butter -fried foods, fast food, processed food, excessive dairy, eggs and coconut.  3)Get at least 150 minutes of sweaty aerobic exercise per week.  4)Reduce stress - consider counseling, meditation and relaxation to balance other aspects of your life.     Lucretia Kern, DO

## 2018-06-22 ENCOUNTER — Encounter: Payer: Self-pay | Admitting: Family Medicine

## 2018-06-22 ENCOUNTER — Ambulatory Visit (INDEPENDENT_AMBULATORY_CARE_PROVIDER_SITE_OTHER): Payer: Medicare Other | Admitting: Family Medicine

## 2018-06-22 VITALS — BP 128/80 | HR 61 | Temp 98.4°F | Ht 71.0 in | Wt >= 6400 oz

## 2018-06-22 DIAGNOSIS — I10 Essential (primary) hypertension: Secondary | ICD-10-CM | POA: Diagnosis not present

## 2018-06-22 DIAGNOSIS — E669 Obesity, unspecified: Secondary | ICD-10-CM

## 2018-06-22 DIAGNOSIS — F17210 Nicotine dependence, cigarettes, uncomplicated: Secondary | ICD-10-CM

## 2018-06-22 DIAGNOSIS — F3342 Major depressive disorder, recurrent, in full remission: Secondary | ICD-10-CM

## 2018-06-22 DIAGNOSIS — Z9114 Patient's other noncompliance with medication regimen: Secondary | ICD-10-CM

## 2018-06-22 DIAGNOSIS — G4733 Obstructive sleep apnea (adult) (pediatric): Secondary | ICD-10-CM

## 2018-06-22 DIAGNOSIS — E662 Morbid (severe) obesity with alveolar hypoventilation: Secondary | ICD-10-CM

## 2018-06-22 DIAGNOSIS — I700 Atherosclerosis of aorta: Secondary | ICD-10-CM

## 2018-06-22 DIAGNOSIS — I5040 Unspecified combined systolic (congestive) and diastolic (congestive) heart failure: Secondary | ICD-10-CM | POA: Diagnosis not present

## 2018-06-22 DIAGNOSIS — Z91199 Patient's noncompliance with other medical treatment and regimen due to unspecified reason: Secondary | ICD-10-CM

## 2018-06-22 DIAGNOSIS — J449 Chronic obstructive pulmonary disease, unspecified: Secondary | ICD-10-CM

## 2018-06-22 NOTE — Patient Instructions (Signed)
BEFORE YOU LEAVE: -number for lung cancer screening clinic -phq9 in epic -follow up: AWV and follow up in 3 months - come fasting  Call the lung cancer screening clinic - it looks like the CT has been ordered.   We recommend the following healthy lifestyle for LIFE: 1) Small portions. But, make sure to get regular (at least 3 per day), healthy meals and small healthy snacks if needed.  2) Eat a healthy clean diet.   TRY TO EAT: -at least 5-7 servings of low sugar, colorful, and nutrient rich vegetables per day (not corn, potatoes or bananas.) -berries are the best choice if you wish to eat fruit (only eat small amounts if trying to reduce weight)  -lean meets (fish, white meat of chicken or [place]) -vegan proteins for some meals - beans or tofu, whole grains, nuts and seeds -Replace bad fats with good fats - good fats include: fish, nuts and seeds, canola oil, olive oil -small amounts of low fat or non fat dairy -small amounts of100 % whole grains - check the lables -drink plenty of water  AVOID: -SUGAR, sweets, anything with added sugar, corn syrup or sweeteners - must read labels as even foods advertised as "healthy" often are loaded with sugar -if you must have a sweetener, small amounts of stevia may be best -sweetened beverages and artificially sweetened beverages -simple starches (rice, bread, potatoes, pasta, chips, etc - small amounts of 100% whole grains are ok) -red meat, pork, butter -fried foods, fast food, processed food, excessive dairy, eggs and coconut.  3)Get at least 150 minutes of sweaty aerobic exercise per week.  4)Reduce stress - consider counseling, meditation and relaxation to balance other aspects of your life.

## 2018-06-29 ENCOUNTER — Inpatient Hospital Stay: Admission: RE | Admit: 2018-06-29 | Payer: Medicare Other | Source: Ambulatory Visit

## 2018-07-13 ENCOUNTER — Other Ambulatory Visit: Payer: Self-pay | Admitting: *Deleted

## 2018-07-13 MED ORDER — POTASSIUM CHLORIDE CRYS ER 20 MEQ PO TBCR: EXTENDED_RELEASE_TABLET | ORAL | 1 refills | Status: DC

## 2018-07-13 NOTE — Telephone Encounter (Signed)
Rx done. 

## 2018-07-13 NOTE — Progress Notes (Deleted)
HPI: Follow-up edema and cardiomyopathy. Echocardiogram June 2014 showed normal LV function, mild diastolic dysfunction and mild biatrial enlargement. 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  Seen for edema October 2019.  Follow-up echocardiogram November 2019 showed ejection fraction 40 to 81% and mild diastolic dysfunction.  Biatrial enlargement.  Severe right ventricular enlargement.    Nuclear study 2020 showed ejection fraction 47% with no ischemia or infarction.  Since last seen   Current Outpatient Medications  Medication Sig Dispense Refill  . aspirin EC 81 MG tablet Take 81 mg by mouth at bedtime.     Marland Kitchen buPROPion (WELLBUTRIN XL) 150 MG 24 hr tablet Take 1 tablet (150 mg total) by mouth daily. 90 tablet 3  . diclofenac sodium (VOLTAREN) 1 % GEL Apply 2 g topically 2 (two) times daily. 300 g 3  . furosemide (LASIX) 40 MG tablet Take 1 tablet (40 mg total) by mouth daily. 90 tablet 3  . losartan (COZAAR) 25 MG tablet Take 1 tablet (25 mg total) by mouth daily. 90 tablet 3  . metoprolol succinate (TOPROL XL) 25 MG 24 hr tablet Take 1 tablet (25 mg total) by mouth daily. 90 tablet 3  . Multiple Vitamin (MULTIVITAMIN WITH MINERALS) TABS tablet Take 1 tablet by mouth daily.    Marland Kitchen omeprazole (PRILOSEC) 20 MG capsule Take 20 mg by mouth daily.    . potassium chloride SA (K-DUR,KLOR-CON) 20 MEQ tablet TAKE 1 TABLET BY MOUTH DAILY (Patient taking differently: Take 20 mEq by mouth daily. ) 90 tablet 3  . traZODone (DESYREL) 150 MG tablet TAKE 1 TABLET(150 MG) BY MOUTH AT BEDTIME 90 tablet 3   No current facility-administered medications for this visit.      Past Medical History:  Diagnosis Date  . Arthritis    OA of knees  . CHF (congestive heart failure) ([place])   . Closed compression fracture of L1 lumbar vertebra 09/03/2016  . Colon polyp   . Complication of anesthesia    slow to wake up  . COPD (chronic obstructive  pulmonary disease) ([facility])    > 30 pack yr smoker  . Depression    Anxiety and Insomnia  . Diverticulosis   . Erectile dysfunction   . GERD (gastroesophageal reflux disease)   . Hemorrhoids   . Hep C w/o coma, chronic ([place]) 06/12/2015   treated with medications  . Leg edema   . Morbid obesity ([place])   . Sleep apnea    refuses CPAP  . 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 COLONOSCOPY  04/18/2011   Procedure: COLONOSCOPY;  Surgeon: Scarlette Shorts, MD;  [facility];  Service: Endoscopy;  Laterality: N/A;  . COLONOSCOPY WITH PROPOFOL N/A 05/07/2016   Procedure: COLONOSCOPY WITH PROPOFOL;  Surgeon: Irene Shipper, MD;  [facility];  Service: Endoscopy;  Laterality: N/A;  . COLONOSCOPY WITH PROPOFOL N/A 08/12/2017   Procedure: COLONOSCOPY WITH PROPOFOL;  Surgeon: Irene Shipper, MD;  [facility];  Service: Endoscopy;  Laterality: N/A;  . ESOPHAGOGASTRODUODENOSCOPY (EGD) WITH PROPOFOL N/A 05/07/2016   Procedure: ESOPHAGOGASTRODUODENOSCOPY (EGD) WITH PROPOFOL;  Surgeon: Irene Shipper, MD;  [facility];  Service: Endoscopy;  Laterality: N/A;  . KNEE ARTHROSCOPY  2011   left  . [place], 2010  .  TIBIA FRACTURE SURGERY  2006   hardware  . TONSILLECTOMY  1963    Social History   Socioeconomic History  . Marital status: Married    Spouse name: Not on file  . Number of children: 4  . Years of education: Not on file  . Highest education level: Not on file  Occupational History  . Occupation: retired  Scientific laboratory technician  . Financial resource strain: Not on file  . Food insecurity:    Worry: Not on file    Inability: Not on file  . Transportation needs:    Medical: Not on file    Non-medical: Not on file  Tobacco Use  . Smoking status: Current Every Day Smoker    Packs/day: 1.00    Years: 51.00    Pack  years: 51.00    Types: Cigarettes    Start date: 05/21/1963  . Smokeless tobacco: Never Used  . Tobacco comment: not ready to quit yet  Substance and Sexual Activity  . Alcohol use: No    Alcohol/week: 0.0 standard drinks  . Drug use: No    Comment: last dose November 2012  . Sexual activity: Yes    Partners: Female    Comment: states no alcohol in 3 weeks  Lifestyle  . Physical activity:    Days per week: Not on file    Minutes per session: Not on file  . Stress: Not on file  Relationships  . Social connections:    Talks on phone: Not on file    Gets together: Not on file    Attends religious service: Not on file    Active member of club or organization: Not on file    Attends meetings of clubs or organizations: Not on file    Relationship status: Not on file  . Intimate partner violence:    Fear of current or ex partner: Not on file    Emotionally abused: Not on file    Physically abused: Not on file    Forced sexual activity: Not on file  Other Topics Concern  . 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 Colon cancer Sister   . Cancer Sister 64       sister, colon   . Breast cancer Mother   . Cancer Mother        breast  . Heart disease Other   . Heart disease Father 78  . Stomach cancer Neg Hx   . Rectal cancer Neg Hx     ROS: no fevers or chills, productive cough, hemoptysis, dysphasia, odynophagia, melena, hematochezia, dysuria, hematuria, rash, seizure activity, orthopnea, PND, pedal edema, claudication. Remaining systems are negative.  Physical Exam: Well-developed well-nourished in no acute distress.  Skin is warm and dry.  HEENT is  normal.  Neck is supple.  Chest is clear to auscultation with normal expansion.  Cardiovascular exam is regular rate and rhythm.  Abdominal exam nontender or distended. No masses palpated. Extremities show no edema. neuro grossly intact  ECG- personally reviewed  A/P  1  Kirk Ruths, MD

## 2018-07-17 ENCOUNTER — Ambulatory Visit: Payer: Medicare Other | Admitting: Cardiology

## 2018-09-11 ENCOUNTER — Telehealth: Payer: Self-pay | Admitting: *Deleted

## 2018-09-11 NOTE — Telephone Encounter (Signed)
Patient has called and scheduled a TOC to Dr Jerilee Hoh. He would also like refills of:  Bupropion Furosemide Trazodone   [employer] [facility]   #[phone]

## 2018-09-13 NOTE — Telephone Encounter (Signed)
Ok to send one month of refills for requested medications.thank you!

## 2018-09-15 MED ORDER — FUROSEMIDE 40 MG PO TABS: 40.0000 mg | ORAL_TABLET | Freq: Every day | ORAL | 0 refills | Status: DC

## 2018-09-15 MED ORDER — BUPROPION HCL ER (XL) 150 MG PO TB24: 150.0000 mg | ORAL_TABLET | Freq: Every day | ORAL | 0 refills | Status: DC

## 2018-09-15 MED ORDER — TRAZODONE HCL 150 MG PO TABS: ORAL_TABLET | ORAL | 0 refills | Status: DC

## 2018-09-21 ENCOUNTER — Telehealth: Payer: Self-pay

## 2018-09-21 NOTE — Telephone Encounter (Signed)
09/21/2018 Patient is participant in [facility] Covid-19 campaign food assistance

## 2018-09-24 ENCOUNTER — Telehealth: Payer: Self-pay | Admitting: *Deleted

## 2018-09-24 NOTE — Telephone Encounter (Signed)
Spoke with patient and appointment rescheduled.

## 2018-09-24 NOTE — Telephone Encounter (Signed)
Copied from [place] [id number]. Topic: Quick Communication - See Telephone Encounter >> Sep 24, 2018 12:05 PM Robina Ade, Helene Kelp D wrote: CRM for notification. See Telephone encounter for: 09/24/18. Patient call and would like to talk to someone about his appointment. He wish to have a office visit and not a virtual visit. Please call patient back

## 2018-09-24 NOTE — Telephone Encounter (Signed)
Left message on machine for patient to return our call to schedule a virtual visit.

## 2018-09-29 ENCOUNTER — Ambulatory Visit: Payer: Self-pay | Admitting: Internal Medicine

## 2018-09-29 ENCOUNTER — Ambulatory Visit: Payer: Medicare Other

## 2018-09-29 ENCOUNTER — Ambulatory Visit: Payer: Medicare Other | Admitting: Family Medicine

## 2018-10-05 ENCOUNTER — Ambulatory Visit: Payer: Medicare Other | Admitting: Cardiology

## 2018-10-09 ENCOUNTER — Ambulatory Visit: Payer: Medicare Other | Admitting: Internal Medicine

## 2018-10-09 ENCOUNTER — Encounter: Payer: Self-pay | Admitting: Internal Medicine

## 2018-10-09 ENCOUNTER — Other Ambulatory Visit: Payer: Self-pay

## 2018-10-09 VITALS — BP 110/70 | HR 62 | Temp 98.2°F | Ht 71.0 in | Wt >= 6400 oz

## 2018-10-09 DIAGNOSIS — I10 Essential (primary) hypertension: Secondary | ICD-10-CM | POA: Diagnosis not present

## 2018-10-09 DIAGNOSIS — G4733 Obstructive sleep apnea (adult) (pediatric): Secondary | ICD-10-CM

## 2018-10-09 DIAGNOSIS — F17210 Nicotine dependence, cigarettes, uncomplicated: Secondary | ICD-10-CM

## 2018-10-09 DIAGNOSIS — F17200 Nicotine dependence, unspecified, uncomplicated: Secondary | ICD-10-CM

## 2018-10-09 DIAGNOSIS — F3342 Major depressive disorder, recurrent, in full remission: Secondary | ICD-10-CM

## 2018-10-09 DIAGNOSIS — F528 Other sexual dysfunction not due to a substance or known physiological condition: Secondary | ICD-10-CM | POA: Diagnosis not present

## 2018-10-09 DIAGNOSIS — I5042 Chronic combined systolic (congestive) and diastolic (congestive) heart failure: Secondary | ICD-10-CM

## 2018-10-09 DIAGNOSIS — B182 Chronic viral hepatitis C: Secondary | ICD-10-CM

## 2018-10-09 DIAGNOSIS — M1990 Unspecified osteoarthritis, unspecified site: Secondary | ICD-10-CM

## 2018-10-09 NOTE — Patient Instructions (Signed)
-  Nice meeting you today!  -Schedule appointment for your annual physical and wellness visit before 3 months have passed. Please come in fasting that day.

## 2018-10-09 NOTE — Progress Notes (Signed)
Established Patient Office Visit     CC/Reason for Visit: Establish care, follow up on chronic medical conditions  HPI: Derrick Mata is a 68 y.o. male who is coming in today for the above mentioned reasons. Past Medical History is significant for:   1. Chronic combined CHF. ECHO from 11/19 with EF 40-45%, mild diffuse hypokinesis and grade 1 DD. He is on a BB, ARB, ASA, lasix. He does have chronic DOE but not changed recently. Follows with Dr. Stanford Breed. Per chart is appears there was a plan for a myocardial perfusion study but does not appear this was ever completed.  2. Morbid Obesity with a BMI of 59.81. He states that with COVID-19 he has gained about 20 pounds. States he cannot exercise much because of his knee OA.  3. OSA, refuses CPAP use.  4. Depression well controlled on wellbutrin.  5. GERD no symptoms on omeprazole.  6. Hep C Cirrhosis, completed treatment with [facility]. Gi is Dr. [name].  7. Ongoing tobacco abuse of at least 1 PPD if not more. He states he chain smokes from 8 am till about 8 pm every day.  He has no complaints today. Appears to be in good spirits.   Past Medical/Surgical History: Past Medical History:  Diagnosis Date  . Arthritis    OA of knees  . CHF (congestive heart failure) ([place])   . Closed compression fracture of L1 lumbar vertebra 09/03/2016  . Colon polyp   . Complication of anesthesia    slow to wake up  . COPD (chronic obstructive pulmonary disease) ([facility])    > 30 pack yr smoker  . Depression    Anxiety and Insomnia  . Diverticulosis   . Erectile dysfunction   . GERD (gastroesophageal reflux disease)   . Hemorrhoids   . Hep C w/o coma, chronic ([place]) 06/12/2015   treated with medications  . Leg edema   . Morbid obesity ([place])   . Sleep apnea    refuses CPAP  . Substance abuse ([place])    hx alcohol and cocaine use, 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, 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    Past Surgical History:  Procedure Laterality  Date  . COLONOSCOPY  04/18/2011   Procedure: COLONOSCOPY;  Surgeon: Scarlette Shorts, MD;  Location: [facility];  Service: Endoscopy;  Laterality: N/A;  . COLONOSCOPY WITH PROPOFOL N/A 05/07/2016   Procedure: COLONOSCOPY WITH PROPOFOL;  Surgeon: Irene Shipper, MD;  Location: [facility];  Service: Endoscopy;  Laterality: N/A;  . COLONOSCOPY WITH PROPOFOL N/A 08/12/2017   Procedure: COLONOSCOPY WITH PROPOFOL;  Surgeon: Irene Shipper, MD;  Location: [facility];  Service: Endoscopy;  Laterality: N/A;  . ESOPHAGOGASTRODUODENOSCOPY (EGD) WITH PROPOFOL N/A 05/07/2016   Procedure: ESOPHAGOGASTRODUODENOSCOPY (EGD) WITH PROPOFOL;  Surgeon: Irene Shipper, MD;  Location: [facility];  Service: Endoscopy;  Laterality: N/A;  . KNEE ARTHROSCOPY  2011   left  . [place], 2010  . TIBIA FRACTURE SURGERY  2006   hardware  . TONSILLECTOMY  1963    Social History:  reports that he has been smoking cigarettes. He started smoking about 55 years ago. He has a 51.00 pack-year smoking history. He has never used smokeless tobacco. He reports that he does not drink alcohol or use drugs.  Allergies: Allergies  Allergen Reactions  . Penicillins Anaphylaxis, Nausea Only and Other (See Comments)    Passed out. Has patient had a PCN reaction causing immediate rash, facial/tongue/throat swelling,  SOB or lightheadedness with hypotension: {no Has patient had a PCN reaction causing severe rash involving mucus membranes or skin necrosis: {no Has patient had a PCN reaction that required hospitalization {no Has patient had a PCN reaction occurring within the last 10 years: {no If all of the above answers are "NO", then may proceed with Cephalosporin use.  . Sulfamethoxazole Hives    Family History:  Family History  Problem Relation Age of Onset  . Colon cancer Sister   . Cancer Sister 57       sister, colon   . Breast cancer Mother   . Cancer Mother        breast  . Heart disease Other   . Heart  disease Father 11  . Stomach cancer Neg Hx   . Rectal cancer Neg Hx      Current Outpatient Medications:  .  aspirin EC 81 MG tablet, Take 81 mg by mouth at bedtime. , Disp: , Rfl:  .  buPROPion (WELLBUTRIN XL) 150 MG 24 hr tablet, Take 1 tablet (150 mg total) by mouth daily., Disp: 90 tablet, Rfl: 0 .  diclofenac sodium (VOLTAREN) 1 % GEL, Apply 2 g topically 2 (two) times daily., Disp: 300 g, Rfl: 3 .  furosemide (LASIX) 40 MG tablet, Take 1 tablet (40 mg total) by mouth daily., Disp: 90 tablet, Rfl: 0 .  metoprolol succinate (TOPROL XL) 25 MG 24 hr tablet, Take 1 tablet (25 mg total) by mouth daily., Disp: 90 tablet, Rfl: 3 .  Multiple Vitamin (MULTIVITAMIN WITH MINERALS) TABS tablet, Take 1 tablet by mouth daily., Disp: , Rfl:  .  omeprazole (PRILOSEC) 20 MG capsule, Take 20 mg by mouth daily., Disp: , Rfl:  .  potassium chloride SA (K-DUR,KLOR-CON) 20 MEQ tablet, TAKE 1 TABLET BY MOUTH DAILY, Disp: 90 tablet, Rfl: 1 .  traZODone (DESYREL) 150 MG tablet, TAKE 1 TABLET(150 MG) BY MOUTH AT BEDTIME, Disp: 30 tablet, Rfl: 0 .  losartan (COZAAR) 25 MG tablet, Take 1 tablet (25 mg total) by mouth daily., Disp: 90 tablet, Rfl: 3  Review of Systems:  Constitutional: Denies fever, chills, diaphoresis, appetite change and fatigue.  HEENT: Denies photophobia, eye pain, redness, hearing loss, ear pain, congestion, sore throat, rhinorrhea, sneezing, mouth sores, trouble swallowing, neck pain, neck stiffness and tinnitus.   Respiratory: Denies SOB, cough, chest tightness,  and wheezing.   Cardiovascular: Denies chest pain, palpitations and leg swelling.  Gastrointestinal: Denies nausea, vomiting, abdominal pain, diarrhea, constipation, blood in stool and abdominal distention.  Genitourinary: Denies dysuria, urgency, frequency, hematuria, flank pain and difficulty urinating.  Endocrine: Denies: hot or cold intolerance, sweats, changes in hair or nails, polyuria, polydipsia. Musculoskeletal: Denies  myalgias, back pain, joint swelling, arthralgias and gait problem.  Skin: Denies pallor, rash and wound.  Neurological: Denies dizziness, seizures, syncope, weakness, light-headedness, numbness and headaches.  Hematological: Denies adenopathy. Easy bruising, personal or family bleeding history  Psychiatric/Behavioral: Denies suicidal ideation, mood changes, confusion, nervousness, sleep disturbance and agitation    Physical Exam: Vitals:   10/09/18 1038  BP: 110/70  Pulse: 62  Temp: 98.2 F (36.8 C)  TempSrc: Oral  SpO2: 98%  Weight: (!) 428 lb 12.8 oz (194.5 kg)  Height: 5\' 11"  (1.803 m)    Body mass index is 59.81 kg/m.   Constitutional: NAD, calm, comfortable, super morbid obesity Eyes: PERRL, lids and conjunctivae normal ENMT: Mucous membranes are moist.  Respiratory: clear to auscultation bilaterally, no wheezing, no crackles. Normal respiratory effort. No  accessory muscle use.  Cardiovascular: Regular rate and rhythm, no murmurs / rubs / gallops. No pitting extremity edema, some non-pitting edema is present.. 2+ pedal pulses. No carotid bruits.  Abdomen: obese, no tenderness, no masses palpated. No hepatosplenomegaly. Bowel sounds positive.  Musculoskeletal: no clubbing / cyanosis. No joint deformity upper and lower extremities. Good ROM, no contractures. Normal muscle tone.  Neurologic: CN 2-12 grossly intact. Sensation intact, DTR normal. Strength 5/5 in all 4.  Psychiatric: Normal judgment and insight. Alert and oriented x 3. Normal mood.    Impression and Plan:  ERECTILE DYSFUNCTION -Not currently on meds. -Have not had time to discuss this issue today.  TOBACCO USE -I have discussed tobacco cessation with the patient.  I have counseled the patient regarding the negative impacts of continued tobacco use including but not limited to lung cancer, COPD, and cardiovascular disease.  I have discussed alternatives to tobacco and modalities that may help facilitate  tobacco cessation including but not limited to biofeedback, hypnosis, and medications.  Total time spent with tobacco counseling was 5 minutes. -He is not interested in quitting at present time.  Essential hypertension -Well controlled  Osteoarthritis, unspecified osteoarthritis type, unspecified site -Fairly well controlled with PRN NSAIDs. -Advised weight loss.  OSA (obstructive sleep apnea) -Non compliant with CPAP.  Hep C w/o coma, chronic (HCC) -S/p successful treatment with [facility]. -Followed by GI.  Morbid obesity ([place]) -Discussed healthy lifestyle, including increased physical activity and better food choices to promote weight loss.   Recurrent major depressive disorder, in full remission ([place]) -Mood is stable on wellbutrin.  Chronic combined systolic and diastolic CHF (congestive heart failure) (H[facility]) -Compensated, does have some DOE altho he states it is no worse than his baseline. -Advised close follow up with cards.    Patient Instructions  -Nice meeting you today!  -Schedule appointment for your annual physical and wellness visit before 3 months have passed. Please come in fasting that day.     Lelon Frohlich, MD [facility]

## 2018-12-08 ENCOUNTER — Encounter: Payer: Self-pay | Admitting: Internal Medicine

## 2018-12-08 ENCOUNTER — Other Ambulatory Visit: Payer: Self-pay

## 2018-12-08 ENCOUNTER — Ambulatory Visit (INDEPENDENT_AMBULATORY_CARE_PROVIDER_SITE_OTHER): Payer: Medicare Other | Admitting: Internal Medicine

## 2018-12-08 VITALS — BP 110/74 | HR 90 | Temp 98.2°F | Ht 71.0 in | Wt >= 6400 oz

## 2018-12-08 DIAGNOSIS — I5040 Unspecified combined systolic (congestive) and diastolic (congestive) heart failure: Secondary | ICD-10-CM | POA: Diagnosis not present

## 2018-12-08 DIAGNOSIS — Z122 Encounter for screening for malignant neoplasm of respiratory organs: Secondary | ICD-10-CM

## 2018-12-08 DIAGNOSIS — I10 Essential (primary) hypertension: Secondary | ICD-10-CM

## 2018-12-08 DIAGNOSIS — E662 Morbid (severe) obesity with alveolar hypoventilation: Secondary | ICD-10-CM

## 2018-12-08 DIAGNOSIS — F17200 Nicotine dependence, unspecified, uncomplicated: Secondary | ICD-10-CM

## 2018-12-08 DIAGNOSIS — G4733 Obstructive sleep apnea (adult) (pediatric): Secondary | ICD-10-CM

## 2018-12-08 DIAGNOSIS — D128 Benign neoplasm of rectum: Secondary | ICD-10-CM

## 2018-12-08 DIAGNOSIS — Z0000 Encounter for general adult medical examination without abnormal findings: Secondary | ICD-10-CM | POA: Diagnosis not present

## 2018-12-08 DIAGNOSIS — D124 Benign neoplasm of descending colon: Secondary | ICD-10-CM

## 2018-12-08 DIAGNOSIS — F3342 Major depressive disorder, recurrent, in full remission: Secondary | ICD-10-CM

## 2018-12-08 DIAGNOSIS — E669 Obesity, unspecified: Secondary | ICD-10-CM

## 2018-12-08 LAB — TSH: TSH: 1.98 u[IU]/mL (ref 0.35–4.50)

## 2018-12-08 LAB — COMPREHENSIVE METABOLIC PANEL
ALT: 6 U/L (ref 0–53)
AST: 10 U/L (ref 0–37)
Albumin: 3.6 g/dL (ref 3.5–5.2)
Alkaline Phosphatase: 55 U/L (ref 39–117)
BUN: 7 mg/dL (ref 6–23)
CO2: 26 mEq/L (ref 19–32)
Calcium: 8.5 mg/dL (ref 8.4–10.5)
Chloride: 105 mEq/L (ref 96–112)
Creatinine, Ser: 0.9 mg/dL (ref 0.40–1.50)
GFR: 100.97 mL/min (ref >60.00)
Glucose, Bld: 79 mg/dL (ref 70–99)
Potassium: 4.2 mEq/L (ref 3.5–5.1)
Sodium: 140 mEq/L (ref 135–145)
Total Bilirubin: 0.4 mg/dL (ref 0.2–1.2)
Total Protein: 6.7 g/dL (ref 6.0–8.3)

## 2018-12-08 LAB — CBC WITH DIFFERENTIAL/PLATELET
Basophils Absolute: 0.1 10*3/uL (ref 0.0–0.1)
Basophils Relative: 0.7 % (ref 0.0–3.0)
Eosinophils Absolute: 0.1 10*3/uL (ref 0.0–0.7)
Eosinophils Relative: 0.9 % (ref 0.0–5.0)
HCT: 42.9 % (ref 39.0–52.0)
Hemoglobin: 13.9 g/dL (ref 13.0–17.0)
Lymphocytes Relative: 22.5 % (ref 12.0–46.0)
Lymphs Abs: 1.9 10*3/uL (ref 0.7–4.0)
MCHC: 32.5 g/dL (ref 30.0–36.0)
MCV: 85 fl (ref 78.0–100.0)
Monocytes Absolute: 0.4 10*3/uL (ref 0.1–1.0)
Monocytes Relative: 5.1 % (ref 3.0–12.0)
Neutro Abs: 5.8 10*3/uL (ref 1.4–7.7)
Neutrophils Relative %: 70.8 % (ref 43.0–77.0)
Platelets: 269 10*3/uL (ref 150.0–400.0)
RBC: 5.04 Mil/uL (ref 4.22–5.81)
RDW: 14.9 % (ref 11.5–15.5)
WBC: 8.2 10*3/uL (ref 4.0–10.5)

## 2018-12-08 LAB — LIPID PANEL
Cholesterol: 154 mg/dL (ref 0–200)
HDL: 42.7 mg/dL (ref >39.00)
LDL Cholesterol: 97 mg/dL (ref 0–99)
NonHDL: 110.98
Total CHOL/HDL Ratio: 4
Triglycerides: 72 mg/dL (ref 0.0–149.0)
VLDL: 14.4 mg/dL (ref 0.0–40.0)

## 2018-12-08 LAB — VITAMIN D 25 HYDROXY (VIT D DEFICIENCY, FRACTURES): VITD: 34 ng/mL (ref 30.00–100.00)

## 2018-12-08 LAB — HEMOGLOBIN A1C: Hgb A1c MFr Bld: 6 % (ref 4.6–6.5)

## 2018-12-08 LAB — VITAMIN B12: Vitamin B-12: 309 pg/mL (ref 211–911)

## 2018-12-08 NOTE — Progress Notes (Signed)
Established Patient Office Visit     CC/Reason for Visit: Annual preventive exam and subsequent Medicare wellness visit  HPI: Derrick Mata is a 68 y.o. male who is coming in today for the above mentioned reasons. Past Medical History is significant for:   1. Chronic combined CHF. ECHO from 11/19 with EF 40-45%, mild diffuse hypokinesis and grade 1 DD. He is on a BB, ARB, ASA, lasix. He does have chronic DOE but not changed recently. Follows with Dr. Stanford Breed. Per chart is appears there was a plan for a myocardial perfusion study but does not appear this was ever completed.  2. Morbid Obesity with a BMI of 59.81. He states that with COVID-19 he has gained about 20 pounds. States he cannot exercise much because of his knee OA.  3. OSA, refuses CPAP use.  4. Depression well controlled on wellbutrin.  5. GERD no symptoms on omeprazole.  6. Hep C Cirrhosis, completed treatment with Harvoni. Gi is Dr. [name].  7. Ongoing tobacco abuse of at least 1 PPD if not more. He states he chain smokes from 8 am till about 8 pm every day.  He has no acute complaints today. He does not have routine eye and dental care He is due for tetanus, we have discussed shingles. He follows with GI routinely, for the past few years has had colonoscopy every other year.   Past Medical/Surgical History: Past Medical History:  Diagnosis Date  . Arthritis    OA of knees  . CHF (congestive heart failure) ([place])   . Closed compression fracture of L1 lumbar vertebra 09/03/2016  . Colon polyp   . Complication of anesthesia    slow to wake up  . COPD (chronic obstructive pulmonary disease) (H[facility])    > 30 pack yr smoker  . Depression    Anxiety and Insomnia  . Diverticulosis   . Erectile dysfunction   . GERD (gastroesophageal reflux disease)   . Hemorrhoids   . Hep C w/o coma, chronic ([place]) 06/12/2015   treated with medications  . Leg edema   . Morbid obesity ([place])   . Sleep apnea    refuses  CPAP  . 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 COLONOSCOPY  04/18/2011   Procedure: COLONOSCOPY;  Surgeon: Scarlette Shorts, MD;  Location: [facility];  Service: Endoscopy;  Laterality: N/A;  . COLONOSCOPY WITH PROPOFOL N/A 05/07/2016   Procedure: COLONOSCOPY WITH PROPOFOL;  Surgeon: Irene Shipper, MD;  Location: [facility];  Service: Endoscopy;  Laterality: N/A;  . COLONOSCOPY WITH PROPOFOL N/A 08/12/2017   Procedure: COLONOSCOPY WITH PROPOFOL;  Surgeon: Irene Shipper, MD;  Location: [facility];  Service: Endoscopy;  Laterality: N/A;  . ESOPHAGOGASTRODUODENOSCOPY (EGD) WITH PROPOFOL N/A 05/07/2016   Procedure: ESOPHAGOGASTRODUODENOSCOPY (EGD) WITH PROPOFOL;  Surgeon: Irene Shipper, MD;  Location: [facility];  Service: Endoscopy;  Laterality: N/A;  . KNEE ARTHROSCOPY  2011   left  . [place], 2010  . TIBIA FRACTURE SURGERY  2006   hardware  . TONSILLECTOMY  1963    Social History:  reports that he has been smoking cigarettes. He started smoking about 55 years ago. He has a 51.00 pack-year smoking history. He has never used smokeless tobacco. He reports that he does not drink alcohol or use drugs.  Allergies: Allergies  Allergen Reactions  . Penicillins Anaphylaxis, Nausea Only and Other (See Comments)    Passed out. Has patient had a PCN reaction causing immediate rash, facial/tongue/throat swelling, SOB or lightheadedness with hypotension: {no Has patient had a PCN reaction causing severe rash involving mucus membranes or skin necrosis: {no Has patient had a PCN reaction that required hospitalization {no Has patient had a PCN reaction occurring within the last 10 years: {no If all of the above answers are "NO", then may proceed with Cephalosporin use.  . Sulfamethoxazole Hives    Family History:  Family History  Problem Relation  Age of Onset  . Colon cancer Sister   . Cancer Sister 62       sister, colon   . Breast cancer Mother   . Cancer Mother        breast  . Heart disease Other   . Heart disease Father 35  . Stomach cancer Neg Hx   . Rectal cancer Neg Hx      Current Outpatient Medications:  .  aspirin EC 81 MG tablet, Take 81 mg by mouth at bedtime. , Disp: , Rfl:  .  buPROPion (WELLBUTRIN XL) 150 MG 24 hr tablet, Take 1 tablet (150 mg total) by mouth daily., Disp: 90 tablet, Rfl: 0 .  diclofenac sodium (VOLTAREN) 1 % GEL, Apply 2 g topically 2 (two) times daily., Disp: 300 g, Rfl: 3 .  furosemide (LASIX) 40 MG tablet, Take 1 tablet (40 mg total) by mouth daily., Disp: 90 tablet, Rfl: 0 .  losartan (COZAAR) 25 MG tablet, Take 1 tablet (25 mg total) by mouth daily., Disp: 90 tablet, Rfl: 3 .  metoprolol succinate (TOPROL XL) 25 MG 24 hr tablet, Take 1 tablet (25 mg total) by mouth daily., Disp: 90 tablet, Rfl: 3 .  Multiple Vitamin (MULTIVITAMIN WITH MINERALS) TABS tablet, Take 1 tablet by mouth daily., Disp: , Rfl:  .  omeprazole (PRILOSEC) 20 MG capsule, Take 20 mg by mouth daily., Disp: , Rfl:  .  potassium chloride SA (K-DUR,KLOR-CON) 20 MEQ tablet, TAKE 1 TABLET BY MOUTH DAILY, Disp: 90 tablet, Rfl: 1 .  traZODone (DESYREL) 150 MG tablet, TAKE 1 TABLET(150 MG) BY MOUTH AT BEDTIME, Disp: 30 tablet, Rfl: 0  Review of Systems:  Constitutional: Denies fever, chills, diaphoresis, appetite change and fatigue.  HEENT: Denies photophobia, eye pain, redness, hearing loss, ear pain, congestion, sore throat, rhinorrhea, sneezing, mouth sores, trouble swallowing, neck pain, neck stiffness and tinnitus.   Respiratory: Denies SOB, DOE, cough, chest tightness,  and wheezing.   Cardiovascular: Denies chest pain, palpitations and leg swelling.  Gastrointestinal: Denies nausea, vomiting, abdominal pain, diarrhea, constipation, blood in stool and abdominal distention.  Genitourinary: Denies dysuria, urgency,  frequency, hematuria, flank pain and difficulty urinating.  Endocrine: Denies: hot or cold intolerance, sweats, changes in hair or nails, polyuria, polydipsia. Musculoskeletal: Denies myalgias, back pain, joint swelling, arthralgias and gait problem.  Skin: Denies pallor, rash and wound.  Neurological: Denies dizziness, seizures, syncope, weakness, light-headedness, numbness and headaches.  Hematological: Denies adenopathy. Easy bruising, personal or family bleeding history  Psychiatric/Behavioral: Denies suicidal ideation, mood changes, confusion, nervousness, sleep disturbance and agitation    Physical Exam: Vitals:   12/08/18 1148  BP: 110/74  Pulse: 90  Temp: 98.2 F (36.8 C)  TempSrc: Oral  SpO2: 97%  Weight: (!) 419 lb 11.2 oz (190.4 kg)  Height: 5' 11"  (1.803 m)    Body mass index is 58.54 kg/m.   Constitutional: NAD, calm,  comfortable, morbidly obese Eyes: PERRL, lids and conjunctivae normal ENMT: Mucous membranes are moist. Posterior pharynx clear of any exudate or lesions. Normal dentition. Tympanic membrane is pearly white, no erythema or bulging. Neck: normal, supple, no masses, no thyromegaly Respiratory: clear to auscultation bilaterally, no wheezing, no crackles. Normal respiratory effort. No accessory muscle use.  Cardiovascular: Regular rate and rhythm, no murmurs / rubs / gallops. No extremity edema. 2+ pedal pulses. No carotid bruits.  Abdomen: no tenderness, no masses palpated. No hepatosplenomegaly. Bowel sounds positive.  Musculoskeletal: no clubbing / cyanosis. No joint deformity upper and lower extremities. Good ROM, no contractures. Normal muscle tone.  Skin: no rashes, lesions, ulcers. No induration Neurologic: CN 2-12 grossly intact. Sensation intact, DTR normal. Strength 5/5 in all 4.  Psychiatric: Normal judgment and insight. Alert and oriented x 3. Normal mood.   Subsequent Medicare wellness visit   1. Risk factors, based on past  M,S,F  -cardiovascular disease risk factors include super morbid obesity, history of hypertension, heart failure.   2.  Physical activities: He does not do any physical activity   3.  Depression/mood:  Stable, but he does have a history of depression he does not appear to be currently depressed   4.  Hearing:  No issues   5.  ADL's: Independent in all ADLs   6.  Fall risk:  Low fall risk   7.  Home safety: No problems identified   8.  Height weight, and visual acuity: Height and weight as above, visual acuity is 20/30 on the right, 20/40 in the left and 20/30 bilaterally   9.  Counseling:  Advised to increase physical activity, weight loss   10. Lab orders based on risk factors: Laboratory update will be reviewed   11. Referral :  None today   12. Care plan:  Follow-up with me in 6 months   13. Cognitive assessment:  No cognitive impairment   14. Screening: Patient provided with a written and personalized 5-10 year screening schedule in the AVS.   yes   15. Provider List Update:   PCP, cardiology Dr. Stanford Breed, GI Dr. [name]  16. Advance Directives: Full code     Office Visit from 12/08/2018 in [place] at [facility]  PHQ-9 Total Score  1      Fall Risk  12/08/2018 12/08/2018 10/09/2018 01/03/2017 04/04/2016  Falls in the past year? 0 0 0 No Yes  Number falls in past yr: 0 0 0 - 1  Injury with Fall? 0 0 0 - No  Risk for fall due to : - - - - History of fall(s);Impaired balance/gait  Follow up - - - - Falls evaluation completed;Education provided     Impression and Plan:  Encounter for preventive health examination -Have advised routine eye and dental care which he does not have. -Screening labs to be done today. -Have discussed healthy lifestyle in detail. -In regards to immunizations, he is due for tetanus and shingles series which I have urged him to receive next local pharmacy. -Will do low-dose chest CT scan for lung cancer screening. -[place] to r/o AAA. -He  follows routinely with GI for colonoscopies, was last done in March 2019.  He has had multiple precancerous polyps removed and his sister has had colon cancer.  ERECTILE DYSFUNCTION -Not currently on meds. -Have not had time to discuss this issue today.  TOBACCO USE -Still chain smoking, will continue to discuss at subsequent visit. -He is not interested in quitting at this  time.  Essential hypertension -Well controlled on metoprolol.  Osteoarthritis, unspecified osteoarthritis type, unspecified site -Fairly well controlled with PRN NSAIDs. -Advised weight loss.  OSA (obstructive sleep apnea) -Non compliant with CPAP.  Hep C w/o coma, chronic (HCC) -S/p successful treatment with Harvoni. -Followed by GI.  Morbid obesity ([place]) -Discussed healthy lifestyle, including increased physical activity and better food choices to promote weight loss.  Recurrent major depressive disorder, in full remission ([place]) -Mood is stable on wellbutrin.  Chronic combined systolic and diastolic CHF (congestive heart failure) (H[facility]) -Compensated, does have some DOE altho he states it is no worse than his baseline. -Advised close follow up with cards.    Patient Instructions  -Nice seeing you today!!  -Lab work today; will notify you once results are available.  -Make sure you get your tetanus vaccine at the pharmacy.  -Make sure you have routine eye and dental care.  -Schedule follow up in 6 months.   Preventive Care 97 Years and Older, Male Preventive care refers to lifestyle choices and visits with your health care provider that can promote health and wellness. This includes:  A yearly physical exam. This is also called an annual well check.  Regular dental and eye exams.  Immunizations.  Screening for certain conditions.  Healthy lifestyle choices, such as diet and exercise. What can I expect for my preventive care visit? Physical exam Your health care provider will  check:  Height and weight. These may be used to calculate body mass index (BMI), which is a measurement that tells if you are at a healthy weight.  Heart rate and blood pressure.  Your skin for abnormal spots. Counseling Your health care provider may ask you questions about:  Alcohol, tobacco, and drug use.  Emotional well-being.  Home and relationship well-being.  Sexual activity.  Eating habits.  History of falls.  Memory and ability to understand (cognition).  Work and work Statistician. What immunizations do I need?  Influenza (flu) vaccine  This is recommended every year. Tetanus, diphtheria, and pertussis (Tdap) vaccine  You may need a Td booster every 10 years. Varicella (chickenpox) vaccine  You may need this vaccine if you have not already been vaccinated. Zoster (shingles) vaccine  You may need this after age 17. Pneumococcal conjugate (PCV13) vaccine  One dose is recommended after age 83. Pneumococcal polysaccharide (PPSV23) vaccine  One dose is recommended after age 71. Measles, mumps, and rubella (MMR) vaccine  You may need at least one dose of MMR if you were born in 1957 or later. You may also need a second dose. Meningococcal conjugate (MenACWY) vaccine  You may need this if you have certain conditions. Hepatitis A vaccine  You may need this if you have certain conditions or if you travel or work in places where you may be exposed to hepatitis A. Hepatitis B vaccine  You may need this if you have certain conditions or if you travel or work in places where you may be exposed to hepatitis B. Haemophilus influenzae type b (Hib) vaccine  You may need this if you have certain conditions. You may receive vaccines as individual doses or as more than one vaccine together in one shot (combination vaccines). Talk with your health care provider about the risks and benefits of combination vaccines. What tests do I need? Blood tests  Lipid and  cholesterol levels. These may be checked every 5 years, or more frequently depending on your overall health.  Hepatitis C test.  Hepatitis B test.  Screening  Lung cancer screening. You may have this screening every year starting at age 87 if you have a 30-pack-year history of smoking and currently smoke or have quit within the past 15 years.  Colorectal cancer screening. All adults should have this screening starting at age 61 and continuing until age 77. Your health care provider may recommend screening at age 67 if you are at increased risk. You will have tests every 1-10 years, depending on your results and the type of screening test.  Prostate cancer screening. Recommendations will vary depending on your family history and other risks.  Diabetes screening. This is done by checking your blood sugar (glucose) after you have not eaten for a while (fasting). You may have this done every 1-3 years.  Abdominal aortic aneurysm (AAA) screening. You may need this if you are a current or former smoker.  Sexually transmitted disease (STD) testing. Follow these instructions at home: Eating and drinking  Eat a diet that includes fresh fruits and vegetables, whole grains, lean protein, and low-fat dairy products. Limit your intake of foods with high amounts of sugar, saturated fats, and salt.  Take vitamin and mineral supplements as recommended by your health care provider.  Do not drink alcohol if your health care provider tells you not to drink.  If you drink alcohol: ? Limit how much you have to 0-2 drinks a day. ? Be aware of how much alcohol is in your drink. In the [place] one drink equals one 12 oz bottle of beer (355 mL), one 5 oz glass of wine (148 mL), or one 1 oz glass of hard liquor (44 mL). Lifestyle  Take daily care of your teeth and gums.  Stay active. Exercise for at least 30 minutes on 5 or more days each week.  Do not use any products that contain nicotine or tobacco, such  as cigarettes, e-cigarettes, and chewing tobacco. If you need help quitting, ask your health care provider.  If you are sexually active, practice safe sex. Use a condom or other form of protection to prevent STIs (sexually transmitted infections).  Talk with your health care provider about taking a low-dose aspirin or statin. What's next?  Visit your health care provider once a year for a well check visit.  Ask your health care provider how often you should have your eyes and teeth checked.  Stay up to date on all vaccines. This information is not intended to replace advice given to you by your health care provider. Make sure you discuss any questions you have with your health care provider. Document Released: 06/02/2015 Document Revised: 04/30/2018 Document Reviewed: 04/30/2018 Elsevier Patient Education  2020 [place], MD [facility]

## 2018-12-08 NOTE — Patient Instructions (Signed)
-Nice seeing you today!!  -Lab work today; will notify you once results are available.  -Make sure you get your tetanus vaccine at the pharmacy.  -Make sure you have routine eye and dental care.  -Schedule follow up in 6 months.   Preventive Care 70 Years and Older, Male Preventive care refers to lifestyle choices and visits with your health care provider that can promote health and wellness. This includes:  A yearly physical exam. This is also called an annual well check.  Regular dental and eye exams.  Immunizations.  Screening for certain conditions.  Healthy lifestyle choices, such as diet and exercise. What can I expect for my preventive care visit? Physical exam Your health care provider will check:  Height and weight. These may be used to calculate body mass index (BMI), which is a measurement that tells if you are at a healthy weight.  Heart rate and blood pressure.  Your skin for abnormal spots. Counseling Your health care provider may ask you questions about:  Alcohol, tobacco, and drug use.  Emotional well-being.  Home and relationship well-being.  Sexual activity.  Eating habits.  History of falls.  Memory and ability to understand (cognition).  Work and work Statistician. What immunizations do I need?  Influenza (flu) vaccine  This is recommended every year. Tetanus, diphtheria, and pertussis (Tdap) vaccine  You may need a Td booster every 10 years. Varicella (chickenpox) vaccine  You may need this vaccine if you have not already been vaccinated. Zoster (shingles) vaccine  You may need this after age 84. Pneumococcal conjugate (PCV13) vaccine  One dose is recommended after age 56. Pneumococcal polysaccharide (PPSV23) vaccine  One dose is recommended after age 1. Measles, mumps, and rubella (MMR) vaccine  You may need at least one dose of MMR if you were born in 1957 or later. You may also need a second dose. Meningococcal conjugate  (MenACWY) vaccine  You may need this if you have certain conditions. Hepatitis A vaccine  You may need this if you have certain conditions or if you travel or work in places where you may be exposed to hepatitis A. Hepatitis B vaccine  You may need this if you have certain conditions or if you travel or work in places where you may be exposed to hepatitis B. Haemophilus influenzae type b (Hib) vaccine  You may need this if you have certain conditions. You may receive vaccines as individual doses or as more than one vaccine together in one shot (combination vaccines). Talk with your health care provider about the risks and benefits of combination vaccines. What tests do I need? Blood tests  Lipid and cholesterol levels. These may be checked every 5 years, or more frequently depending on your overall health.  Hepatitis C test.  Hepatitis B test. Screening  Lung cancer screening. You may have this screening every year starting at age 49 if you have a 30-pack-year history of smoking and currently smoke or have quit within the past 15 years.  Colorectal cancer screening. All adults should have this screening starting at age 79 and continuing until age 78. Your health care provider may recommend screening at age 32 if you are at increased risk. You will have tests every 1-10 years, depending on your results and the type of screening test.  Prostate cancer screening. Recommendations will vary depending on your family history and other risks.  Diabetes screening. This is done by checking your blood sugar (glucose) after you have not eaten for  a while (fasting). You may have this done every 1-3 years.  Abdominal aortic aneurysm (AAA) screening. You may need this if you are a current or former smoker.  Sexually transmitted disease (STD) testing. Follow these instructions at home: Eating and drinking  Eat a diet that includes fresh fruits and vegetables, whole grains, lean protein, and  low-fat dairy products. Limit your intake of foods with high amounts of sugar, saturated fats, and salt.  Take vitamin and mineral supplements as recommended by your health care provider.  Do not drink alcohol if your health care provider tells you not to drink.  If you drink alcohol: ? Limit how much you have to 0-2 drinks a day. ? Be aware of how much alcohol is in your drink. In the [place] one drink equals one 12 oz bottle of beer (355 mL), one 5 oz glass of wine (148 mL), or one 1 oz glass of hard liquor (44 mL). Lifestyle  Take daily care of your teeth and gums.  Stay active. Exercise for at least 30 minutes on 5 or more days each week.  Do not use any products that contain nicotine or tobacco, such as cigarettes, e-cigarettes, and chewing tobacco. If you need help quitting, ask your health care provider.  If you are sexually active, practice safe sex. Use a condom or other form of protection to prevent STIs (sexually transmitted infections).  Talk with your health care provider about taking a low-dose aspirin or statin. What's next?  Visit your health care provider once a year for a well check visit.  Ask your health care provider how often you should have your eyes and teeth checked.  Stay up to date on all vaccines. This information is not intended to replace advice given to you by your health care provider. Make sure you discuss any questions you have with your health care provider. Document Released: 06/02/2015 Document Revised: 04/30/2018 Document Reviewed: 04/30/2018 Elsevier Patient Education  2020 [employer].

## 2018-12-09 ENCOUNTER — Encounter: Payer: Self-pay | Admitting: Internal Medicine

## 2018-12-09 DIAGNOSIS — R7302 Impaired glucose tolerance (oral): Secondary | ICD-10-CM | POA: Insufficient documentation

## 2018-12-10 ENCOUNTER — Other Ambulatory Visit: Payer: Self-pay | Admitting: *Deleted

## 2018-12-10 DIAGNOSIS — Z122 Encounter for screening for malignant neoplasm of respiratory organs: Secondary | ICD-10-CM

## 2018-12-10 DIAGNOSIS — F17210 Nicotine dependence, cigarettes, uncomplicated: Secondary | ICD-10-CM

## 2018-12-10 DIAGNOSIS — Z87891 Personal history of nicotine dependence: Secondary | ICD-10-CM

## 2018-12-14 ENCOUNTER — Other Ambulatory Visit: Payer: Self-pay | Admitting: Internal Medicine

## 2019-01-03 ENCOUNTER — Other Ambulatory Visit: Payer: Self-pay | Admitting: Family Medicine

## 2019-01-14 NOTE — Progress Notes (Signed)
HPI: Follow-up edema and cardiomyopathy. Echocardiogram June 2014 showed normal LV function, mild diastolic dysfunction and mild biatrial enlargement. 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  Seen for edema October 2019.  Follow-up echocardiogram November 2019 showed ejection fraction 40 to [id number] and mild diastolic dysfunction.  Biatrial enlargement.  Severe right ventricular enlargement. Nuclear study January 2020 showed ejection fraction 47% and no ischemia or infarction.  Since last seen patient denies dyspnea, chest pain, palpitations or syncope.  Minimal pedal edema.  Current Outpatient Medications  Medication Sig Dispense Refill  . aspirin EC 81 MG tablet Take 81 mg by mouth at bedtime.     Marland Kitchen buPROPion (WELLBUTRIN XL) 150 MG 24 hr tablet Take 1 tablet (150 mg total) by mouth daily. 90 tablet 0  . diclofenac sodium (VOLTAREN) 1 % GEL Apply 2 g topically 2 (two) times daily. 300 g 3  . furosemide (LASIX) 40 MG tablet TAKE 1 TABLET(40 MG) BY MOUTH DAILY 90 tablet 1  . metoprolol succinate (TOPROL XL) 25 MG 24 hr tablet Take 1 tablet (25 mg total) by mouth daily. 90 tablet 3  . Multiple Vitamin (MULTIVITAMIN WITH MINERALS) TABS tablet Take 1 tablet by mouth daily.    Marland Kitchen omeprazole (PRILOSEC) 20 MG capsule Take 20 mg by mouth daily.    . potassium chloride SA (K-DUR) 20 MEQ tablet TAKE 1 TABLET BY MOUTH DAILY 90 tablet 1  . traZODone (DESYREL) 150 MG tablet TAKE 1 TABLET(150 MG) BY MOUTH AT BEDTIME 30 tablet 0  . losartan (COZAAR) 25 MG tablet Take 1 tablet (25 mg total) by mouth daily. 90 tablet 3   No current facility-administered medications for this visit.      Past Medical History:  Diagnosis Date  . Arthritis    OA of knees  . CHF (congestive heart failure) ([place])   . Closed compression fracture of L1 lumbar vertebra 09/03/2016  . Colon polyp   . Complication of anesthesia    slow to wake up  . COPD (chronic  obstructive pulmonary disease) ([facility])    > 30 pack yr smoker  . Depression    Anxiety and Insomnia  . Diverticulosis   . Erectile dysfunction   . GERD (gastroesophageal reflux disease)   . Hemorrhoids   . Hep C w/o coma, chronic ([place]) 06/12/2015   treated with medications  . Leg edema   . Morbid obesity ([place])   . Sleep apnea    refuses CPAP  . 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 COLONOSCOPY  04/18/2011   Procedure: COLONOSCOPY;  Surgeon: Scarlette Shorts, MD;  Location: [facility];  Service: Endoscopy;  Laterality: N/A;  . COLONOSCOPY WITH PROPOFOL N/A 05/07/2016   Procedure: COLONOSCOPY WITH PROPOFOL;  Surgeon: Irene Shipper, MD;  [facility];  Service: Endoscopy;  Laterality: N/A;  . COLONOSCOPY WITH PROPOFOL N/A 08/12/2017   Procedure: COLONOSCOPY WITH PROPOFOL;  Surgeon: Irene Shipper, MD;  [facility];  Service: Endoscopy;  Laterality: N/A;  . ESOPHAGOGASTRODUODENOSCOPY (EGD) WITH PROPOFOL N/A 05/07/2016   Procedure: ESOPHAGOGASTRODUODENOSCOPY (EGD) WITH PROPOFOL;  Surgeon: Irene Shipper, MD;  [facility];  Service: Endoscopy;  Laterality: N/A;  . KNEE ARTHROSCOPY  2011   left  . [place], 2010  . TIBIA  FRACTURE SURGERY  2006   hardware  . TONSILLECTOMY  1963    Social History   Socioeconomic History  . Marital status: Married    Spouse name: Not on file  . Number of children: 4  . Years of education: Not on file  . Highest education level: Not on file  Occupational History  . Occupation: retired  Scientific laboratory technician  . Financial resource strain: Not on file  . Food insecurity    Worry: Not on file    Inability: Not on file  . Transportation needs    Medical: Not on file    Non-medical: Not on file  Tobacco Use  . Smoking status: Current Every Day Smoker    Packs/day: 1.00    Years: 51.00     Pack years: 51.00    Types: Cigarettes    Start date: 05/21/1963  . Smokeless tobacco: Never Used  . Tobacco comment: not ready to quit yet  Substance and Sexual Activity  . Alcohol use: No    Alcohol/week: 0.0 standard drinks  . Drug use: No    Comment: last dose November 2012  . Sexual activity: Yes    Partners: Female    Comment: states no alcohol in 3 weeks  Lifestyle  . Physical activity    Days per week: Not on file    Minutes per session: Not on file  . Stress: Not on file  Relationships  . Social Herbalist on phone: Not on file    Gets together: Not on file    Attends religious service: Not on file    Active member of club or organization: Not on file    Attends meetings of clubs or organizations: Not on file    Relationship status: Not on file  . Intimate partner violence    Fear of current or ex partner: Not on file    Emotionally abused: Not on file    Physically abused: Not on file    Forced sexual activity: Not on file  Other Topics Concern  . 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 Colon cancer Sister   . Cancer Sister 59       sister, colon   . Breast cancer Mother   . Cancer Mother        breast  . Heart disease Other   . Heart disease Father 71  . Stomach cancer Neg Hx   . Rectal cancer Neg Hx     ROS: no fevers or chills, productive cough, hemoptysis, dysphasia, odynophagia, melena, hematochezia, dysuria, hematuria, rash, seizure activity, orthopnea, PND, pedal edema, claudication. Remaining systems are negative.  Physical Exam: Well-developed morbridly obese in no acute distress.  Skin is warm and dry.  HEENT  is normal.  Neck is supple.  Chest is clear to auscultation with normal expansion.  Cardiovascular exam is regular rate and rhythm.  Abdominal exam nontender or distended. No masses palpated. Extremities show trace edema. neuro grossly intact  ECG-sinus rhythm at a rate of 68, nonspecific ST changes.  Personally reviewed  A/P  1 cardiomyopathy-nuclear study suggests likely nonischemic.  Continue ARB (increase losartan to 50 mg daily) and beta-blocker. Check BMET one week;  repeat echocardiogram in 3 months.  2 chronic combined systolic/diastolic congestive heart failure-volume status difficult to assess because of weight.  Plan to continue present dose of Lasix.  3 lower extremity edema-this is felt to be multifactorial including pulmonary venous hypertension, RV dysfunction secondary to obesity/obstructive sleep apnea.  Continue diuretic.  4 tobacco abuse-patient counseled on discontinuing.  5 morbid obesity-patient counseled on diet, weight loss and exercise.  Kirk Ruths, MD

## 2019-01-18 ENCOUNTER — Other Ambulatory Visit: Payer: Self-pay

## 2019-01-18 ENCOUNTER — Ambulatory Visit (INDEPENDENT_AMBULATORY_CARE_PROVIDER_SITE_OTHER): Payer: Medicare Other | Admitting: Cardiology

## 2019-01-18 ENCOUNTER — Encounter: Payer: Self-pay | Admitting: Cardiology

## 2019-01-18 VITALS — BP 140/60 | HR 68 | Ht 72.0 in | Wt >= 6400 oz

## 2019-01-18 DIAGNOSIS — I5040 Unspecified combined systolic (congestive) and diastolic (congestive) heart failure: Secondary | ICD-10-CM

## 2019-01-18 DIAGNOSIS — Z720 Tobacco use: Secondary | ICD-10-CM

## 2019-01-18 DIAGNOSIS — I420 Dilated cardiomyopathy: Secondary | ICD-10-CM | POA: Diagnosis not present

## 2019-01-18 DIAGNOSIS — I10 Essential (primary) hypertension: Secondary | ICD-10-CM

## 2019-01-18 MED ORDER — LOSARTAN POTASSIUM 50 MG PO TABS: 50.0000 mg | ORAL_TABLET | Freq: Every day | ORAL | 3 refills | Status: DC

## 2019-01-18 NOTE — Patient Instructions (Signed)
Medication Instructions:  INCREASE LOSARTAN TO 50 MG ONCE DAILY= 2 OF THE 25 MG TABLETS ONCE DAILY  If you need a refill on your cardiac medications before your next appointment, please call your pharmacy.   Lab work: Your physician recommends that you return for lab work in: [place]  If you have labs (blood work) drawn today and your tests are completely normal, you will receive your results only by: Marland Kitchen MyChart Message (if you have MyChart) OR . A paper copy in the mail If you have any lab test that is abnormal or we need to change your treatment, we will call you to review the results.  Testing/Procedures: Your physician has requested that you have an echocardiogram. Echocardiography is a painless test that uses sound waves to create images of your heart. It provides your doctor with information about the size and shape of your heart and how well your heart's chambers and valves are working. This procedure takes approximately one hour. There are no restrictions for this procedure.[place] 3 MONTHS    Follow-Up: At [facility], you and your health needs are our priority.  As part of our continuing mission to provide you with exceptional heart care, we have created designated Provider Care Teams.  These Care Teams include your primary Cardiologist (physician) and Advanced Practice Providers (APPs -  Physician Assistants and Nurse Practitioners) who all work together to provide you with the care you need, when you need it. You will need a follow up appointment in 6 months.  Please call our office 2 months in advance to schedule this appointment.  You may see Kirk Ruths MD or one of the following Advanced Practice Providers on your designated Care Team:   Kerin Ransom, PA-C Roby [place]fts, [place] . Sande Rives, PA-C

## 2019-01-20 NOTE — Addendum Note (Signed)
Addended by: Cristopher Estimable on: 01/20/2019 04:32 PM   Modules accepted: Orders

## 2019-01-21 ENCOUNTER — Other Ambulatory Visit: Payer: Self-pay

## 2019-01-21 ENCOUNTER — Encounter (HOSPITAL_COMMUNITY): Payer: Medicare Other

## 2019-01-21 ENCOUNTER — Ambulatory Visit (INDEPENDENT_AMBULATORY_CARE_PROVIDER_SITE_OTHER)
Admission: RE | Admit: 2019-01-21 | Discharge: 2019-01-21 | Disposition: A | Discharge status: Home / Self Care | Payer: Medicare Other | Source: Ambulatory Visit | Attending: Acute Care | Admitting: Acute Care

## 2019-01-21 ENCOUNTER — Other Ambulatory Visit: Payer: Self-pay | Admitting: Internal Medicine

## 2019-01-21 DIAGNOSIS — Z87891 Personal history of nicotine dependence: Secondary | ICD-10-CM | POA: Diagnosis not present

## 2019-01-21 DIAGNOSIS — Z122 Encounter for screening for malignant neoplasm of respiratory organs: Secondary | ICD-10-CM

## 2019-01-21 DIAGNOSIS — F17210 Nicotine dependence, cigarettes, uncomplicated: Secondary | ICD-10-CM

## 2019-01-21 DIAGNOSIS — Z0000 Encounter for general adult medical examination without abnormal findings: Secondary | ICD-10-CM

## 2019-01-21 DIAGNOSIS — F17200 Nicotine dependence, unspecified, uncomplicated: Secondary | ICD-10-CM

## 2019-01-22 ENCOUNTER — Telehealth: Payer: Self-pay | Admitting: Acute Care

## 2019-01-22 DIAGNOSIS — Z87891 Personal history of nicotine dependence: Secondary | ICD-10-CM

## 2019-01-22 DIAGNOSIS — Z122 Encounter for screening for malignant neoplasm of respiratory organs: Secondary | ICD-10-CM

## 2019-01-22 DIAGNOSIS — F17210 Nicotine dependence, cigarettes, uncomplicated: Secondary | ICD-10-CM

## 2019-01-22 NOTE — Telephone Encounter (Signed)
ATC pt, no answer. Left message for pt to call back.   otes recorded by Magdalen Spatz, NP on 01/22/2019 at 12:18 PM EDT  Please call patient and let them know their low dose Ct was read as a. Lung RADS 2: nodules that are benign in appearance and behavior with a very low likelihood of becoming a clinically active cancer due to size or lack of growth. Recommendation per radiology is for a repeat LDCT in 12 months..Please let them know we will order and schedule their annual screening scan for 01/2020. Marland Kitchen Please place order for annual screening scan for 01/2020 and fax results to PCP. Thanks so much.   Pt is followed by Dr. Stanford Breed and has echo pending.

## 2019-01-26 NOTE — Telephone Encounter (Signed)
Pt informed of CT results per Sarah Groce, NP.  PT verbalized understanding.  Copy sent to PCP.  Order placed for 1 yr f/u CT.  

## 2019-01-29 ENCOUNTER — Encounter (HOSPITAL_COMMUNITY): Payer: Medicare Other

## 2019-02-04 ENCOUNTER — Telehealth: Payer: Self-pay | Admitting: Cardiology

## 2019-02-04 MED ORDER — CARVEDILOL 6.25 MG PO TABS: 6.2500 mg | ORAL_TABLET | Freq: Two times a day (BID) | ORAL | 3 refills | Status: DC

## 2019-02-04 NOTE — Telephone Encounter (Signed)
Since starting the Metoprolol he has been having a lot of itching. At night about 15-30 minutes after taking his Metoprolol he starts itching and itches all night, he is unable to sleep. Patient states he is not going to continue them. Will forward to Dr Stanford Breed for review

## 2019-02-04 NOTE — Telephone Encounter (Signed)
Left message to call back  

## 2019-02-04 NOTE — Telephone Encounter (Signed)
Advised patient, verbalized understanding  

## 2019-02-04 NOTE — Telephone Encounter (Signed)
New Message    Pt c/o medication issue:  1. Name of Medication: Metoprolol Succinate 25mg   2. How are you currently taking this medication (dosage and times per day)? 1 Tablet by mouth daily  3. Are you having a reaction (difficulty breathing--STAT)? NO  4. What is your medication issue? Patient states it makes him itch all over after taking the medication.  Please call back to advise.  Patient states he's allergic to sulfur and wants to know if it's in this medication.

## 2019-02-04 NOTE — Telephone Encounter (Signed)
Patient called back returning a call from our office °

## 2019-02-04 NOTE — Telephone Encounter (Signed)
DC metoprolol and treat with coreg 6.25 mg BID Kirk Ruths

## 2019-02-08 ENCOUNTER — Other Ambulatory Visit: Payer: Self-pay

## 2019-02-08 ENCOUNTER — Ambulatory Visit (HOSPITAL_COMMUNITY)
Admission: RE | Admit: 2019-02-08 | Discharge: 2019-02-08 | Disposition: A | Discharge status: Home / Self Care | Payer: Medicare Other | Source: Ambulatory Visit | Attending: Cardiology | Admitting: Cardiology

## 2019-02-08 DIAGNOSIS — I10 Essential (primary) hypertension: Secondary | ICD-10-CM | POA: Insufficient documentation

## 2019-02-08 DIAGNOSIS — Z136 Encounter for screening for cardiovascular disorders: Secondary | ICD-10-CM | POA: Diagnosis not present

## 2019-02-08 DIAGNOSIS — F17210 Nicotine dependence, cigarettes, uncomplicated: Secondary | ICD-10-CM | POA: Diagnosis not present

## 2019-02-08 DIAGNOSIS — Z0000 Encounter for general adult medical examination without abnormal findings: Secondary | ICD-10-CM | POA: Insufficient documentation

## 2019-02-08 DIAGNOSIS — F17200 Nicotine dependence, unspecified, uncomplicated: Secondary | ICD-10-CM | POA: Diagnosis present

## 2019-02-08 DIAGNOSIS — I700 Atherosclerosis of aorta: Secondary | ICD-10-CM | POA: Diagnosis not present

## 2019-02-09 LAB — BASIC METABOLIC PANEL
BUN/Creatinine Ratio: 10 (ref 10–24)
BUN: 10 mg/dL (ref 8–27)
CO2: 23 mmol/L (ref 20–29)
Calcium: 9 mg/dL (ref 8.6–10.2)
Chloride: 105 mmol/L (ref 96–106)
Creatinine, Ser: 0.98 mg/dL (ref 0.76–1.27)
GFR calc Af Amer: 90 mL/min/{1.73_m2} (ref >59)
GFR calc non Af Amer: 78 mL/min/{1.73_m2} (ref >59)
Glucose: 78 mg/dL (ref 65–99)
Potassium: 4.9 mmol/L (ref 3.5–5.2)
Sodium: 141 mmol/L (ref 134–144)

## 2019-04-08 ENCOUNTER — Ambulatory Visit (HOSPITAL_COMMUNITY): Payer: Medicare Other | Attending: Cardiology

## 2019-04-08 ENCOUNTER — Other Ambulatory Visit: Payer: Self-pay

## 2019-04-08 DIAGNOSIS — I5040 Unspecified combined systolic (congestive) and diastolic (congestive) heart failure: Secondary | ICD-10-CM

## 2019-04-08 DIAGNOSIS — I420 Dilated cardiomyopathy: Secondary | ICD-10-CM | POA: Insufficient documentation

## 2019-05-30 ENCOUNTER — Other Ambulatory Visit: Payer: Self-pay | Admitting: Internal Medicine

## 2019-06-01 ENCOUNTER — Other Ambulatory Visit: Payer: Self-pay | Admitting: Family Medicine

## 2019-06-01 ENCOUNTER — Other Ambulatory Visit: Payer: Self-pay

## 2019-06-16 ENCOUNTER — Telehealth: Payer: Self-pay | Admitting: Cardiology

## 2019-06-16 ENCOUNTER — Other Ambulatory Visit: Payer: Self-pay | Admitting: Family Medicine

## 2019-06-16 MED ORDER — METOPROLOL SUCCINATE ER 25 MG PO TB24: 25.0000 mg | ORAL_TABLET | Freq: Every day | ORAL | 3 refills | Status: DC

## 2019-06-16 NOTE — Telephone Encounter (Signed)
Spoke with pt who states that he had contacted [facility] d/t presumed rxn to his metoprolol and had requested to d/c it. He states his skin irritation was not related to metoprolol so he restarted the medication a week or so after his call to the office. Pt states he is about to run out of the medication and is requesting refill to be sent to [facility], [address]  [address], [place] [id number] . Reviewed 02/04/2019 telephone encounter. Per Dr. Stanford Breed, pt was to d/c metoprolol and start coreg 6.25 mg BID. Pt states he never started Coreg. Informed pt that current med list can be updated to reflect that he is back on metoprolol and not on Coreg and then refill of his metoprolol succinate 25 mg can be sent to pharmacy but will route encounter to Dr. Stanford Breed and primary nurse. Pt states his BP today is 113/70.

## 2019-06-16 NOTE — Telephone Encounter (Signed)
Ok to resume metoprolol at previous dose and not coreg [employer]

## 2019-06-16 NOTE — Telephone Encounter (Signed)
  Pt c/o medication issue:  1. Name of Medication: metropolol  2. How are you currently taking this medication (dosage and times per day)? Once a day  3. Are you having a reaction (difficulty breathing--STAT)? no  4. What is your medication issue? Patient calling stating he needs to get back on his metropolol. He says he had a skin irritation and this medication was not the problem. He states he only has one tablet left and needs the medication.

## 2019-06-16 NOTE — Telephone Encounter (Signed)
Spoke with pt, Aware of dr crenshaw's recommendations. New script sent to the pharmacy  

## 2019-06-18 ENCOUNTER — Other Ambulatory Visit: Payer: Self-pay

## 2019-06-24 ENCOUNTER — Telehealth: Payer: Self-pay | Admitting: *Deleted

## 2019-06-24 NOTE — Telephone Encounter (Signed)
A message was left, re: his follow up visit with Dr.Crenshaw.

## 2019-07-22 NOTE — Progress Notes (Incomplete)
{  Choose 1 Note Type (Telehealth Visit or Telephone Visit):[phone]}  ?Evaluation Performed:  Follow-up visit ? ?This visit type was conducted due to national recommendations for restrictions regarding the COVID-19 Pandemic (e.g. social distancing).  This format is felt to be most appropriate for this patient at this time.  All issues noted in this document were discussed and addressed.  No physical exam was performed (except for noted visual exam findings with Video Visits).  Please refer to the patient's chart (MyChart message for video visits and phone note for telephone visits) for the patient's consent to telehealth for [facility] ? ?Date:  07/22/2019  ? ?ID:  Derrick Mata, DOB 1950-07-08, MRN [id number] ? ?Patient Location: *** ?[place] ?[place] [address]  ? ?Provider location:   ?[place] ?[address] ?Office [phone] Fax [phone] ? ? ?PCP:  Derrick Mata, Derrick Halsted, MD  ?Cardiologist:  Derrick Ruths, MD *** ?Electrophysiologist:  None  ? ?Chief Complaint: Follow-up ? ?History of Present Illness:   ? ?Derrick Mata is a 69 y.o. male who presents via audio/video conferencing for a telehealth visit today.  Patient verified DOB and address. ? ?The patient {does/does not:200015} symptoms concerning for COVID-19 infection (fever, chills, cough, or new SHORTNESS OF BREATH).  ? ? ? [name] has a past medical history of essential hypertension, congestive heart failure, aortic atherosclerosis, OSA, obesity hypoventilation syndrome, COPD, hepatitis C, hepatic cirrhosis, superobesity, EtOH and tobacco use.  His echocardiogram 6/14 showed normal LV function, mild diastolic dysfunction and mild biatrial enlargement.  He was seen for evaluation of his lower extremity edema in October of 2019.  On follow-up in November 2019 his echocardiogram showed an ejection fraction of [id number] and mild diastolic dysfunction enlargement, severe right ventricular enlargement.  A nuclear study 05/2018

## 2019-07-23 ENCOUNTER — Telehealth: Payer: Medicare Other | Admitting: General Practice

## 2019-07-26 ENCOUNTER — Ambulatory Visit: Payer: Medicare Other | Admitting: General Practice

## 2019-08-28 ENCOUNTER — Other Ambulatory Visit: Payer: Self-pay | Admitting: Internal Medicine

## 2019-09-07 ENCOUNTER — Other Ambulatory Visit: Payer: Self-pay | Admitting: Internal Medicine

## 2019-09-15 NOTE — Progress Notes (Signed)
HPI: Follow-up edema and cardiomyopathy. Patient also with history of morbid obesity, sleep apnea, cirrhosis secondary to hepatitis C and alcohol.Seen for edema October 2019. Follow-up echocardiogram November 2019 showed ejection fraction 40 to [id number] and mild diastolic dysfunction. Biatrial enlargement. Severe right ventricular enlargement. Nuclear study January 2020 showed ejection fraction 47% and no ischemia or infarction.  Abdominal ultrasound September 2020 showed no aneurysm.  Last echocardiogram November 2020 showed ejection fraction 35 to [id number], grade 1 diastolic dysfunction, trace mitral and tricuspid regurgitation; mildly dilated aortic root.  Since last seen  he denies increased dyspnea, chest pain, palpitations, syncope or worsening pedal edema.  Current Outpatient Medications  Medication Sig Dispense Refill  . aspirin EC 81 MG tablet Take 81 mg by mouth at bedtime.     Marland Kitchen buPROPion (WELLBUTRIN XL) 150 MG 24 hr tablet TAKE 1 TABLET(150 MG) BY MOUTH DAILY 90 tablet 0  . diclofenac sodium (VOLTAREN) 1 % GEL Apply 2 g topically 2 (two) times daily. 300 g 3  . furosemide (LASIX) 40 MG tablet TAKE 1 TABLET(40 MG) BY MOUTH DAILY 90 tablet 0  . metoprolol succinate (TOPROL XL) 25 MG 24 hr tablet Take 1 tablet (25 mg total) by mouth daily. 90 tablet 3  . Multiple Vitamin (MULTIVITAMIN WITH MINERALS) TABS tablet Take 1 tablet by mouth daily.    Marland Kitchen omeprazole (PRILOSEC) 20 MG capsule Take 20 mg by mouth daily.    . potassium chloride SA (K-DUR) 20 MEQ tablet TAKE 1 TABLET BY MOUTH DAILY 90 tablet 1  . traZODone (DESYREL) 150 MG tablet TAKE 1 TABLET(150 MG) BY MOUTH AT BEDTIME 90 tablet 1  . losartan (COZAAR) 50 MG tablet Take 1 tablet (50 mg total) by mouth daily. 90 tablet 3   No current facility-administered medications for this visit.     Past Medical History:  Diagnosis Date  . Arthritis    OA of knees  . CHF (congestive heart failure) ([place])   . Closed compression fracture of  L1 lumbar vertebra 09/03/2016  . Colon polyp   . Complication of anesthesia    slow to wake up  . COPD (chronic obstructive pulmonary disease) ([facility])    > 30 pack yr smoker  . Depression    Anxiety and Insomnia  . Diverticulosis   . Erectile dysfunction   . GERD (gastroesophageal reflux disease)   . Hemorrhoids   . Hep C w/o coma, chronic ([place]) 06/12/2015   treated with medications  . Leg edema   . Morbid obesity ([place])   . Sleep apnea    refuses CPAP  . Substance abuse ([place])    hx alcohol and cocaine use, 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, 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    Past Surgical History:  Procedure Laterality Date  . COLONOSCOPY  04/18/2011   Procedure: COLONOSCOPY;  Surgeon: Scarlette Shorts, MD;  [facility];  Service: Endoscopy;  Laterality: N/A;  . COLONOSCOPY WITH PROPOFOL N/A 05/07/2016   Procedure: COLONOSCOPY WITH PROPOFOL;  Surgeon: Irene Shipper, MD;  [facility];  Service: Endoscopy;  Laterality: N/A;  . COLONOSCOPY WITH PROPOFOL N/A 08/12/2017   Procedure: COLONOSCOPY WITH PROPOFOL;  Surgeon: Irene Shipper, MD;  [facility];  Service: Endoscopy;  Laterality: N/A;  . ESOPHAGOGASTRODUODENOSCOPY (EGD) WITH PROPOFOL N/A 05/07/2016   Procedure: ESOPHAGOGASTRODUODENOSCOPY (EGD) WITH PROPOFOL;  Surgeon: Irene Shipper, MD;  [facility];  Service: Endoscopy;  Laterality: N/A;  . KNEE ARTHROSCOPY  2011  left  . [place], 2010  . TIBIA FRACTURE SURGERY  2006   hardware  . TONSILLECTOMY  1963    Social History   Socioeconomic History  . Marital status: Married    Spouse name: Not on file  . Number of children: 4  . Years of education: Not on file  . Highest education level: Not on file  Occupational History  . Occupation: retired  Tobacco Use  . Smoking status: Current Every Day Smoker    Packs/day: 1.00    Years: 51.00    Pack years: 51.00    Types: Cigarettes    Start date: 05/21/1963  . Smokeless tobacco:  Never Used  . Tobacco comment: not ready to quit yet  Substance and Sexual Activity  . Alcohol use: No    Alcohol/week: 0.0 standard drinks  . Drug use: No    Comment: last dose November 2012  . Sexual activity: Yes    Partners: Female    Comment: states no alcohol in 3 weeks  Other Topics Concern  . 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 Difficulty of Paying Living Expenses:   Food Insecurity:   . Worried About Charity fundraiser in the Last Year:   . Arboriculturist in the Last Year:   Transportation Needs:   . Film/video editor (Medical):   Marland Kitchen Lack of Transportation (Non-Medical):   Physical Activity:   . Days of Exercise per Week:   . Minutes of Exercise per Session:   Stress:   . Feeling of Stress :   Social Connections:   . Frequency of Communication with Friends and Family:   . Frequency of Social Gatherings with Friends and Family:   . Attends Religious Services:   . Active Member of Clubs or Organizations:   . Attends Archivist Meetings:   Marland Kitchen Marital Status:   Intimate Partner Violence:   . Fear of Current or Ex-Partner:   . Emotionally Abused:   Marland Kitchen Physically Abused:   . Sexually Abused:     Family History  Problem Relation Age of Onset  . Colon cancer Sister   . Cancer Sister 62       sister, colon   . Breast cancer Mother   . Cancer Mother        breast  . Heart disease Other   . Heart disease Father 25  . Stomach cancer Neg Hx   . Rectal cancer Neg Hx     ROS: no fevers or chills, productive cough, hemoptysis, dysphasia, odynophagia, melena, hematochezia, dysuria, hematuria, rash, seizure activity,  orthopnea, PND, claudication. Remaining systems are negative.  Physical Exam: Well-developed morbidly obese in no acute distress.  Skin is warm and dry.  HEENT is normal.  Neck is supple.  Chest is clear to auscultation with normal expansion.  Cardiovascular exam is regular rate and rhythm.  Abdominal exam nontender or distended. No masses palpated. Extremities show trace edema. neuro grossly intact  A/P  1 cardiomyopathy-based on nuclear study this  is felt to be nonischemic.  LV function remains reduced.  Will discontinue losartan and instead treat with Entresto 24/26 twice daily.  Continue beta-blocker.  Check potassium and renal function in 1 week.  2 chronic combined systolic/diastolic congestive heart failure-due to morbid obesity volume status difficult to assess.  Continue Lasix at present dose.  Add spironolactone 12.5 mg daily. Bmet one week.  3 morbid obesity-we discussed the importance of exercise, diet and weight loss.  4 tobacco abuse-patient counseled on discontinuing.  5 lower extremity edema-this is felt to be secondary to pulmonary venous hypertension, obesity/hypoventilation syndrome/obstructive sleep apnea with resultant RV dysfunction.  Continue diuretic at present dose.  Kirk Ruths, MD

## 2019-09-16 ENCOUNTER — Encounter: Payer: Self-pay | Admitting: Cardiology

## 2019-09-16 ENCOUNTER — Ambulatory Visit: Payer: Medicare Other | Admitting: Cardiology

## 2019-09-16 ENCOUNTER — Other Ambulatory Visit: Payer: Self-pay

## 2019-09-16 VITALS — BP 140/74 | HR 70 | Ht 73.0 in | Wt >= 6400 oz

## 2019-09-16 DIAGNOSIS — I5040 Unspecified combined systolic (congestive) and diastolic (congestive) heart failure: Secondary | ICD-10-CM | POA: Diagnosis not present

## 2019-09-16 DIAGNOSIS — R609 Edema, unspecified: Secondary | ICD-10-CM

## 2019-09-16 DIAGNOSIS — I420 Dilated cardiomyopathy: Secondary | ICD-10-CM

## 2019-09-16 DIAGNOSIS — Z720 Tobacco use: Secondary | ICD-10-CM | POA: Diagnosis not present

## 2019-09-16 MED ORDER — SPIRONOLACTONE 25 MG PO TABS: 25.0000 mg | ORAL_TABLET | Freq: Every day | ORAL | 3 refills | Status: DC

## 2019-09-16 MED ORDER — ENTRESTO 24-26 MG PO TABS: 1.0000 | ORAL_TABLET | Freq: Two times a day (BID) | ORAL | 6 refills | Status: DC

## 2019-09-16 NOTE — Patient Instructions (Signed)
Medication Instructions:  STOP LOSARTAN  START ENTRESTO 24/26 MG ONE TABLET TWICE DAILY  START SPIRONOLACTONE 25 MG ONCE DAILY  *If you need a refill on your cardiac medications before your next appointment, please call your pharmacy*   Lab Work: Your physician recommends that you return for lab work in: [place]  If you have labs (blood work) drawn today and your tests are completely normal, you will receive your results only by: Marland Kitchen MyChart Message (if you have MyChart) OR . A paper copy in the mail If you have any lab test that is abnormal or we need to change your treatment, we will call you to review the results.   Follow-Up: At [facility], you and your health needs are our priority.  As part of our continuing mission to provide you with exceptional heart care, we have created designated Provider Care Teams.  These Care Teams include your primary Cardiologist (physician) and Advanced Practice Providers (APPs -  Physician Assistants and Nurse Practitioners) who all work together to provide you with the care you need, when you need it.  We recommend signing up for the patient portal called "MyChart".  Sign up information is provided on this After Visit Summary.  MyChart is used to connect with patients for Virtual Visits (Telemedicine).  Patients are able to view lab/test results, encounter notes, upcoming appointments, etc.  Non-urgent messages can be sent to your provider as well.   To learn more about what you can do with MyChart, go to [email or web address].    Your next appointment:   6 month(s)  The format for your next appointment:   Either In Person or Virtual  Provider:   You may see Kirk Ruths, MD or one of the following Advanced Practice Providers on your designated Care Team:    Kerin Ransom, PA-C  [place], [place]  Coletta Memos, [place]

## 2019-09-24 DIAGNOSIS — I5040 Unspecified combined systolic (congestive) and diastolic (congestive) heart failure: Secondary | ICD-10-CM | POA: Diagnosis not present

## 2019-09-25 LAB — BASIC METABOLIC PANEL
BUN/Creatinine Ratio: 9 — ABNORMAL LOW (ref 10–24)
BUN: 8 mg/dL (ref 8–27)
CO2: 25 mmol/L (ref 20–29)
Calcium: 9 mg/dL (ref 8.6–10.2)
Chloride: 102 mmol/L (ref 96–106)
Creatinine, Ser: 0.93 mg/dL (ref 0.76–1.27)
GFR calc Af Amer: 96 mL/min/{1.73_m2} (ref >59)
GFR calc non Af Amer: 83 mL/min/{1.73_m2} (ref >59)
Glucose: 61 mg/dL — ABNORMAL LOW (ref 65–99)
Potassium: 4.9 mmol/L (ref 3.5–5.2)
Sodium: 139 mmol/L (ref 134–144)

## 2019-11-26 ENCOUNTER — Other Ambulatory Visit: Payer: Self-pay | Admitting: Internal Medicine

## 2019-12-14 DIAGNOSIS — H35033 Hypertensive retinopathy, bilateral: Secondary | ICD-10-CM | POA: Diagnosis not present

## 2019-12-14 DIAGNOSIS — H35363 Drusen (degenerative) of macula, bilateral: Secondary | ICD-10-CM | POA: Diagnosis not present

## 2019-12-14 DIAGNOSIS — H25013 Cortical age-related cataract, bilateral: Secondary | ICD-10-CM | POA: Diagnosis not present

## 2019-12-14 DIAGNOSIS — H2513 Age-related nuclear cataract, bilateral: Secondary | ICD-10-CM | POA: Diagnosis not present

## 2019-12-14 DIAGNOSIS — H524 Presbyopia: Secondary | ICD-10-CM | POA: Diagnosis not present

## 2019-12-16 ENCOUNTER — Other Ambulatory Visit: Payer: Self-pay | Admitting: Internal Medicine

## 2019-12-28 ENCOUNTER — Other Ambulatory Visit: Payer: Self-pay | Admitting: Internal Medicine

## 2020-02-24 ENCOUNTER — Other Ambulatory Visit: Payer: Self-pay | Admitting: Internal Medicine

## 2020-03-07 NOTE — Progress Notes (Deleted)
HPI: Follow-up edema and cardiomyopathy. Patient also with history of morbid obesity, sleep apnea, cirrhosis secondary to hepatitis C and alcohol.Seen for edema October 2019. Follow-up echocardiogram November 2019 showed ejection fraction 40 to 76% and mild diastolic dysfunction. Biatrial enlargement. Severe right ventricular enlargement.Nuclear study January 2020 showed ejection fraction 47% and no ischemia or infarction.  Abdominal ultrasound September 2020 showed no aneurysm.  Last echocardiogram November 2020 showed ejection fraction 35 to 19%, grade 1 diastolic dysfunction, trace mitral and tricuspid regurgitation; mildly dilated aortic root.  Since last seen   Current Outpatient Medications  Medication Sig Dispense Refill   aspirin EC 81 MG tablet Take 81 mg by mouth at bedtime.      buPROPion (WELLBUTRIN XL) 150 MG 24 hr tablet TAKE 1 TABLET(150 MG) BY MOUTH DAILY 90 tablet 0   diclofenac sodium (VOLTAREN) 1 % GEL Apply 2 g topically 2 (two) times daily. 300 g 3   furosemide (LASIX) 40 MG tablet TAKE 1 TABLET(40 MG) BY MOUTH DAILY 30 tablet 0   metoprolol succinate (TOPROL XL) 25 MG 24 hr tablet Take 1 tablet (25 mg total) by mouth daily. 90 tablet 3   Multiple Vitamin (MULTIVITAMIN WITH MINERALS) TABS tablet Take 1 tablet by mouth daily.     omeprazole (PRILOSEC) 20 MG capsule Take 20 mg by mouth daily.     potassium chloride SA (KLOR-CON) 20 MEQ tablet TAKE 1 TABLET BY MOUTH DAILY 90 tablet 1   sacubitril-valsartan (ENTRESTO) 24-26 MG Take 1 tablet by mouth 2 (two) times daily. 60 tablet 6   spironolactone (ALDACTONE) 25 MG tablet Take 1 tablet (25 mg total) by mouth daily. 90 tablet 3   traZODone (DESYREL) 150 MG tablet TAKE 1 TABLET(150 MG) BY MOUTH AT BEDTIME 90 tablet 1   No current facility-administered medications for this visit.     Past Medical History:  Diagnosis Date   Arthritis    OA of knees   CHF (congestive heart failure) (HCC)    Closed  compression fracture of L1 lumbar vertebra 09/03/2016   Colon polyp    Complication of anesthesia    slow to wake up   COPD (chronic obstructive pulmonary disease) (HCC)    > 30 pack yr smoker   Depression    Anxiety and Insomnia   Diverticulosis    Erectile dysfunction    GERD (gastroesophageal reflux disease)    Hemorrhoids    Hep C w/o coma, chronic ([place]) 06/12/2015   treated with medications   Leg edema    Morbid obesity ([place])    Sleep apnea    refuses CPAP   Substance abuse ([place])    hx alcohol and cocaine use, 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, 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    Past Surgical History:  Procedure Laterality Date   COLONOSCOPY  04/18/2011   Procedure: COLONOSCOPY;  Surgeon: Scarlette Shorts, MD;  [facility];  Service: Endoscopy;  Laterality: N/A;   COLONOSCOPY WITH PROPOFOL N/A 05/07/2016   Procedure: COLONOSCOPY WITH PROPOFOL;  Surgeon: Irene Shipper, MD;  [facility];  Service: Endoscopy;  Laterality: N/A;   COLONOSCOPY WITH PROPOFOL N/A 08/12/2017   Procedure: COLONOSCOPY WITH PROPOFOL;  Surgeon: Irene Shipper, MD;  [facility];  Service: Endoscopy;  Laterality: N/A;   ESOPHAGOGASTRODUODENOSCOPY (EGD) WITH PROPOFOL N/A 05/07/2016   Procedure: ESOPHAGOGASTRODUODENOSCOPY (EGD) WITH PROPOFOL;  Surgeon: Irene Shipper, MD;  [facility];  Service: Endoscopy;  Laterality: N/A;   KNEE  ARTHROSCOPY  2011   left   LUMBAR LAMINECTOMY  1994, 2010   [place] FRACTURE SURGERY  2006   hardware   TONSILLECTOMY  1963    Social History   Socioeconomic History   Marital status: Married    Spouse name: Not on file   Number of children: 4   Years of education: Not on file   Highest education level: Not on file  Occupational History   Occupation: retired  Tobacco Use   Smoking status: Current Every Day Smoker    Packs/day: 1.00    Years: 51.00    Pack years: 51.00    Types: Cigarettes    Start date: 05/21/1963    Smokeless tobacco: Never Used   Tobacco comment: not ready to quit yet  Vaping Use   Vaping Use: Never used  Substance and Sexual Activity   Alcohol use: No    Alcohol/week: 0.0 standard drinks   Drug use: No    Comment: last dose November 2012   Sexual activity: Yes    Partners: Female    Comment: states no alcohol in 3 weeks  Other Topics Concern   Not on file  Social History Narrative   Updated 06/12/15   Work or School: none, 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      Home Situation: lives with wife and grandson      Spiritual Beliefs: [name], does not attend church      Lifestyle: no regular exercise, diet is not great but is interested in this      Social Determinants of Radio broadcast assistant Strain:    Difficulty of Paying Living Expenses: Not on file  Food Insecurity:    Worried About Charity fundraiser in the Last Year: Not on file   [place] in the Last Year: Not on file  Transportation Needs:    Lack of Transportation (Medical): Not on file   Lack of Transportation (Non-Medical): Not on file  Physical Activity:    Days of Exercise per Week: Not on file   Minutes of Exercise per Session: Not on file  Stress:    Feeling of Stress : Not on file  Social Connections:    Frequency of Communication with Friends and Family: Not on file   Frequency of Social Gatherings with Friends and Family: Not on file   Attends Religious Services: Not on file   Active Member of Clubs or Organizations: Not on file   Attends Archivist Meetings: Not on file   Marital Status: Not on file  Intimate Partner Violence:    Fear of Current or Ex-Partner: Not on file   Emotionally Abused: Not on file   Physically Abused: Not on file   Sexually Abused: Not on file    Family History  Problem Relation Age of Onset   Colon cancer Sister    Cancer Sister 87       sister, colon    Breast cancer Mother    Cancer Mother         breast   Heart disease Other    Heart disease Father 48   Stomach cancer Neg Hx    Rectal cancer Neg Hx     ROS: no fevers or chills, productive cough, hemoptysis, dysphasia, odynophagia, melena, hematochezia, dysuria, hematuria, rash, seizure activity, orthopnea, PND, pedal edema, claudication. Remaining systems are negative.  Physical Exam: Well-developed well-nourished in no acute distress.  Skin is warm and  dry.  HEENT is normal.  Neck is supple.  Chest is clear to auscultation with normal expansion.  Cardiovascular exam is regular rate and rhythm.  Abdominal exam nontender or distended. No masses palpated. Extremities show no edema. neuro grossly intact  ECG- personally reviewed  A/P  1 cardiomyopathy-etiology unclear but felt to be nonischemic given previous nuclear study results.  Continue Entresto and beta-blocker.  2 chronic combined systolic/diastolic congestive heart failure-continue Lasix and spironolactone.  Check potassium and renal function.  3 morbid obesity-we again discussed the importance of efforts at diet, exercise and weight loss.  4 tobacco abuse-patient has been counseled on discontinuing.  5 lower extremity edema-felt to be secondary to a combination of pulmonary venous hypertension, obesity hypoventilation syndrome and obstructive sleep apnea with resultant RV dysfunction.  Continue diuretic.  Check potassium and renal function.  Kirk Ruths, MD

## 2020-03-14 ENCOUNTER — Ambulatory Visit: Payer: Medicare Other | Admitting: Cardiology

## 2020-03-23 ENCOUNTER — Other Ambulatory Visit: Payer: Self-pay | Admitting: Internal Medicine

## 2020-04-08 ENCOUNTER — Other Ambulatory Visit: Payer: Self-pay | Admitting: Internal Medicine

## 2020-04-26 ENCOUNTER — Other Ambulatory Visit: Payer: Self-pay | Admitting: Internal Medicine

## 2020-05-23 ENCOUNTER — Ambulatory Visit: Payer: Medicare Other | Admitting: Internal Medicine

## 2020-05-24 ENCOUNTER — Other Ambulatory Visit: Payer: Self-pay | Admitting: Cardiology

## 2020-05-24 ENCOUNTER — Other Ambulatory Visit: Payer: Self-pay | Admitting: Internal Medicine

## 2020-06-12 ENCOUNTER — Other Ambulatory Visit: Payer: Self-pay | Admitting: Internal Medicine

## 2020-06-27 NOTE — Progress Notes (Signed)
HPI: Follow-up edema and cardiomyopathy. Patient also with history of morbid obesity, sleep apnea, cirrhosis secondary to hepatitis C and alcohol.Seen for edema October 2019. Follow-up echocardiogram November 2019 showed ejection fraction 40 to 47% and mild diastolic dysfunction. Biatrial enlargement. Severe right ventricular enlargement.Nuclear study January 2020 showed ejection fraction 47% and no ischemia or infarction.  Abdominal ultrasound September 2020 showed no aneurysm.  Last echocardiogram November 2020 showed ejection fraction 35 to 82%, grade 1 diastolic dysfunction, trace mitral and tricuspid regurgitation; mildly dilated aortic root.  Since last seen  patient denies dyspnea, chest pain, palpitations or syncope.  Current Outpatient Medications  Medication Sig Dispense Refill  . aspirin EC 81 MG tablet Take 81 mg by mouth at bedtime.    Marland Kitchen buPROPion (WELLBUTRIN XL) 150 MG 24 hr tablet TAKE 1 TABLET(150 MG) BY MOUTH DAILY 90 tablet 1  . diclofenac sodium (VOLTAREN) 1 % GEL Apply 2 g topically 2 (two) times daily. 300 g 3  . furosemide (LASIX) 40 MG tablet TAKE 1 TABLET(40 MG) BY MOUTH DAILY 90 tablet 0  . metoprolol succinate (TOPROL-XL) 25 MG 24 hr tablet TAKE 1 TABLET(25 MG) BY MOUTH DAILY 90 tablet 3  . Multiple Vitamin (MULTIVITAMIN WITH MINERALS) TABS tablet Take 1 tablet by mouth daily.    . potassium chloride SA (KLOR-CON) 20 MEQ tablet TAKE 1 TABLET BY MOUTH DAILY 90 tablet 1  . sacubitril-valsartan (ENTRESTO) 24-26 MG Take 1 tablet by mouth 2 (two) times daily. 60 tablet 6  . traZODone (DESYREL) 150 MG tablet Take 1 tablet (150 mg total) by mouth at bedtime. 90 tablet 1  . spironolactone (ALDACTONE) 25 MG tablet Take 1 tablet (25 mg total) by mouth daily. 90 tablet 3   No current facility-administered medications for this visit.     Past Medical History:  Diagnosis Date  . Arthritis    OA of knees  . CHF (congestive heart failure) ([place])   . Closed  compression fracture of L1 lumbar vertebra 09/03/2016  . Colon polyp   . Complication of anesthesia    slow to wake up  . COPD (chronic obstructive pulmonary disease) (HCC)    > 30 pack yr smoker  . Depression    Anxiety and Insomnia  . Diverticulosis   . Erectile dysfunction   . GERD (gastroesophageal reflux disease)   . Hemorrhoids   . Hep C w/o coma, chronic ([place]) 06/12/2015   treated with medications  . Leg edema   . Morbid obesity ([place])   . Sleep apnea    refuses CPAP  . Substance abuse ([place])    hx alcohol and cocaine use, 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, 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    Past Surgical History:  Procedure Laterality Date  . COLONOSCOPY  04/18/2011   Procedure: COLONOSCOPY;  Surgeon: Scarlette Shorts, MD;  [facility];  Service: Endoscopy;  Laterality: N/A;  . COLONOSCOPY WITH PROPOFOL N/A 05/07/2016   Procedure: COLONOSCOPY WITH PROPOFOL;  Surgeon: Irene Shipper, MD;  [facility];  Service: Endoscopy;  Laterality: N/A;  . COLONOSCOPY WITH PROPOFOL N/A 08/12/2017   Procedure: COLONOSCOPY WITH PROPOFOL;  Surgeon: Irene Shipper, MD;  [facility];  Service: Endoscopy;  Laterality: N/A;  . ESOPHAGOGASTRODUODENOSCOPY (EGD) WITH PROPOFOL N/A 05/07/2016   Procedure: ESOPHAGOGASTRODUODENOSCOPY (EGD) WITH PROPOFOL;  Surgeon: Irene Shipper, MD;  [facility];  Service: Endoscopy;  Laterality: N/A;  . KNEE ARTHROSCOPY  2011   left  . LUMBAR  LAMINECTOMY  1994, 2010  . TIBIA FRACTURE SURGERY  2006   hardware  . TONSILLECTOMY  1963    Social History   Socioeconomic History  . Marital status: Married    Spouse name: Not on file  . Number of children: 4  . Years of education: Not on file  . Highest education level: Not on file  Occupational History  . Occupation: retired  Tobacco Use  . Smoking status: Current Every Day Smoker    Packs/day: 1.00    Years: 51.00    Pack years: 51.00    Types: Cigarettes    Start date: 05/21/1963   . Smokeless tobacco: Never Used  . Tobacco comment: not ready to quit yet  Vaping Use  . Vaping Use: Never used  Substance and Sexual Activity  . Alcohol use: No    Alcohol/week: 0.0 standard drinks  . Drug use: No    Comment: last dose November 2012  . Sexual activity: Yes    Partners: Female    Comment: states no alcohol in 3 weeks  Other Topics Concern  . 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 Colon cancer Sister   . Cancer Sister 62       sister, colon   . Breast cancer Mother   . Cancer Mother        breast  . Heart disease Other   . Heart disease Father 40  . Stomach cancer Neg Hx   . Rectal cancer Neg Hx     ROS: no fevers or chills, productive cough, hemoptysis, dysphasia, odynophagia, melena, hematochezia, dysuria, hematuria, rash, seizure activity, orthopnea, PND, pedal edema, claudication. Remaining systems are negative.  Physical Exam: Well-developed morbidly obese in no acute distress.  Skin is warm and dry.  HEENT is normal.  Neck is supple.  Chest is clear to auscultation with normal expansion.  Cardiovascular exam is regular rate and rhythm.  Abdominal exam nontender or distended. No masses palpated. Extremities show trace edema. neuro grossly intact  ECG-sinus rhythm at a rate of 63, left axis deviation, no ST changes. Personally reviewed  A/P  1  cardiomyopathy-this is felt likely to be nonischemic in etiology based on previous nuclear study.  Continue Entresto and beta-blocker.  Check potassium and renal function.  Repeat echocardiogram.  2 chronic combined systolic/diastolic congestive heart failure-we will continue diuretics at present dose.  Check potassium and renal function.  3 lower extremity edema-this is felt secondary to a combination of pulmonary venous hypertension, obesity hypoventilation syndrome, obstructive sleep apnea with resultant RV dysfunction.  Continue diuretics.  4 tobacco abuse-patient counseled on discontinuing.  5 morbid obesity-we discussed the importance of weight loss.  Kirk Ruths, MD

## 2020-06-28 ENCOUNTER — Other Ambulatory Visit: Payer: Self-pay | Admitting: Internal Medicine

## 2020-07-03 ENCOUNTER — Other Ambulatory Visit: Payer: Self-pay

## 2020-07-04 ENCOUNTER — Encounter: Payer: Self-pay | Admitting: Internal Medicine

## 2020-07-04 ENCOUNTER — Ambulatory Visit (INDEPENDENT_AMBULATORY_CARE_PROVIDER_SITE_OTHER): Payer: Medicare Other | Admitting: Internal Medicine

## 2020-07-04 ENCOUNTER — Encounter: Payer: Self-pay | Admitting: Cardiology

## 2020-07-04 ENCOUNTER — Other Ambulatory Visit: Payer: Self-pay

## 2020-07-04 ENCOUNTER — Ambulatory Visit: Payer: Medicare Other | Admitting: Cardiology

## 2020-07-04 VITALS — BP 120/80 | HR 54 | Temp 97.6°F | Wt >= 6400 oz

## 2020-07-04 VITALS — BP 130/80 | HR 63 | Ht 73.0 in | Wt >= 6400 oz

## 2020-07-04 DIAGNOSIS — I5040 Unspecified combined systolic (congestive) and diastolic (congestive) heart failure: Secondary | ICD-10-CM

## 2020-07-04 DIAGNOSIS — I10 Essential (primary) hypertension: Secondary | ICD-10-CM

## 2020-07-04 DIAGNOSIS — F17200 Nicotine dependence, unspecified, uncomplicated: Secondary | ICD-10-CM

## 2020-07-04 DIAGNOSIS — G4700 Insomnia, unspecified: Secondary | ICD-10-CM | POA: Diagnosis not present

## 2020-07-04 DIAGNOSIS — F3342 Major depressive disorder, recurrent, in full remission: Secondary | ICD-10-CM | POA: Diagnosis not present

## 2020-07-04 DIAGNOSIS — E669 Obesity, unspecified: Secondary | ICD-10-CM | POA: Diagnosis not present

## 2020-07-04 DIAGNOSIS — M1990 Unspecified osteoarthritis, unspecified site: Secondary | ICD-10-CM

## 2020-07-04 DIAGNOSIS — E662 Morbid (severe) obesity with alveolar hypoventilation: Secondary | ICD-10-CM | POA: Diagnosis not present

## 2020-07-04 DIAGNOSIS — Z720 Tobacco use: Secondary | ICD-10-CM | POA: Diagnosis not present

## 2020-07-04 DIAGNOSIS — R7302 Impaired glucose tolerance (oral): Secondary | ICD-10-CM

## 2020-07-04 DIAGNOSIS — J449 Chronic obstructive pulmonary disease, unspecified: Secondary | ICD-10-CM | POA: Diagnosis not present

## 2020-07-04 DIAGNOSIS — G4733 Obstructive sleep apnea (adult) (pediatric): Secondary | ICD-10-CM

## 2020-07-04 DIAGNOSIS — I420 Dilated cardiomyopathy: Secondary | ICD-10-CM

## 2020-07-04 LAB — POCT GLYCOSYLATED HEMOGLOBIN (HGB A1C): Hemoglobin A1C: 5.8 % — AB (ref 4.0–5.6)

## 2020-07-04 MED ORDER — TRAZODONE HCL 150 MG PO TABS: 150.0000 mg | ORAL_TABLET | Freq: Every day | ORAL | 1 refills | Status: DC

## 2020-07-04 MED ORDER — SPIRONOLACTONE 25 MG PO TABS: 25.0000 mg | ORAL_TABLET | Freq: Every day | ORAL | 3 refills | Status: DC

## 2020-07-04 MED ORDER — BUPROPION HCL ER (XL) 150 MG PO TB24: ORAL_TABLET | ORAL | 1 refills | Status: DC

## 2020-07-04 NOTE — Progress Notes (Signed)
Established Patient Office Visit     This visit occurred during the SARS-CoV-2 public health emergency.  Safety protocols were in place, including screening questions prior to the visit, additional usage of staff PPE, and extensive cleaning of exam room while observing appropriate contact time as indicated for disinfecting solutions.    CC/Reason for Visit: Follow-up chronic medical conditions, medication refills  HPI: Jeremie Giangrande is a 70 y.o. male who is coming in today for the above mentioned reasons. Past Medical History is significant for:   1. Chronic combined CHF.  Followed by Dr. Stanford Breed, most recent medication changes include addition of Entresto.  2. Morbid Obesity with a BMI of 59.81. He states that with COVID-19 he has gained about 20 pounds. States he cannot exercise much because of his knee OA.  3. OSA, refuses CPAP use.  4. Depression well controlled on wellbutrin.  5. GERD no symptoms on omeprazole.  6. Hep C Cirrhosis, completed treatment with [facility]. Gi is Dr. [name].  7. Ongoing tobacco abuse of at least 1 PPD if not more. He states he chain smokes from 8 am till about 8 pm every day.  8.  Impaired glucose tolerance.  I have not seen him since July 2020, because of this is medication refills have been denied.  This is the main reason for this appointment today.  He is having difficulty sleeping without his trazodone.  He is known to be noncompliant with CPAP, I am positive that some of his sleeping issues are related to undertreated sleep apnea.  We have talked at length about necessity of weight loss and potential bariatric surgery.   Past Medical/Surgical History: Past Medical History:  Diagnosis Date  . Arthritis    OA of knees  . CHF (congestive heart failure) ([place])   . Closed compression fracture of L1 lumbar vertebra 09/03/2016  . Colon polyp   . Complication of anesthesia    slow to wake up  . COPD (chronic obstructive pulmonary  disease) (HCC)    > 30 pack yr smoker  . Depression    Anxiety and Insomnia  . Diverticulosis   . Erectile dysfunction   . GERD (gastroesophageal reflux disease)   . Hemorrhoids   . Hep C w/o coma, chronic ([place]) 06/12/2015   treated with medications  . Leg edema   . Morbid obesity ([place])   . Sleep apnea    refuses CPAP  . Substance abuse ([place])    hx alcohol and cocaine use, 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, 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    Past Surgical History:  Procedure Laterality Date  . COLONOSCOPY  04/18/2011   Procedure: COLONOSCOPY;  Surgeon: Scarlette Shorts, MD;  [facility];  Service: Endoscopy;  Laterality: N/A;  . COLONOSCOPY WITH PROPOFOL N/A 05/07/2016   Procedure: COLONOSCOPY WITH PROPOFOL;  Surgeon: Irene Shipper, MD;  [facility];  Service: Endoscopy;  Laterality: N/A;  . COLONOSCOPY WITH PROPOFOL N/A 08/12/2017   Procedure: COLONOSCOPY WITH PROPOFOL;  Surgeon: Irene Shipper, MD;  [facility];  Service: Endoscopy;  Laterality: N/A;  . ESOPHAGOGASTRODUODENOSCOPY (EGD) WITH PROPOFOL N/A 05/07/2016   Procedure: ESOPHAGOGASTRODUODENOSCOPY (EGD) WITH PROPOFOL;  Surgeon: Irene Shipper, MD;  [facility];  Service: Endoscopy;  Laterality: N/A;  . KNEE ARTHROSCOPY  2011   left  . [place], 2010  . TIBIA FRACTURE SURGERY  2006   hardware  . [place]  History:  reports that he has been smoking cigarettes. He started smoking about 57 years ago. He has a 51.00 pack-year smoking history. He has never used smokeless tobacco. He reports that he does not drink alcohol and does not use drugs.  Allergies: Allergies  Allergen Reactions  . Penicillins Anaphylaxis, Nausea Only and Other (See Comments)    Passed out. Has patient had a PCN reaction causing immediate rash, facial/tongue/throat swelling, SOB or lightheadedness with hypotension: {no Has patient had a PCN reaction causing severe rash involving  mucus membranes or skin necrosis: {no Has patient had a PCN reaction that required hospitalization {no Has patient had a PCN reaction occurring within the last 10 years: {no If all of the above answers are "NO", then may proceed with Cephalosporin use.  . Sulfamethoxazole Hives    Family History:  Family History  Problem Relation Age of Onset  . Colon cancer Sister   . Cancer Sister 25       sister, colon   . Breast cancer Mother   . Cancer Mother        breast  . Heart disease Other   . Heart disease Father 42  . Stomach cancer Neg Hx   . Rectal cancer Neg Hx      Current Outpatient Medications:  .  aspirin EC 81 MG tablet, Take 81 mg by mouth at bedtime., Disp: , Rfl:  .  diclofenac sodium (VOLTAREN) 1 % GEL, Apply 2 g topically 2 (two) times daily., Disp: 300 g, Rfl: 3 .  furosemide (LASIX) 40 MG tablet, TAKE 1 TABLET(40 MG) BY MOUTH DAILY, Disp: 90 tablet, Rfl: 0 .  metoprolol succinate (TOPROL-XL) 25 MG 24 hr tablet, TAKE 1 TABLET(25 MG) BY MOUTH DAILY, Disp: 90 tablet, Rfl: 3 .  Multiple Vitamin (MULTIVITAMIN WITH MINERALS) TABS tablet, Take 1 tablet by mouth daily., Disp: , Rfl:  .  potassium chloride SA (KLOR-CON) 20 MEQ tablet, TAKE 1 TABLET BY MOUTH DAILY, Disp: 90 tablet, Rfl: 1 .  sacubitril-valsartan (ENTRESTO) 24-26 MG, Take 1 tablet by mouth 2 (two) times daily., Disp: 60 tablet, Rfl: 6 .  spironolactone (ALDACTONE) 25 MG tablet, Take 1 tablet (25 mg total) by mouth daily., Disp: 90 tablet, Rfl: 3 .  buPROPion (WELLBUTRIN XL) 150 MG 24 hr tablet, TAKE 1 TABLET(150 MG) BY MOUTH DAILY, Disp: 90 tablet, Rfl: 1 .  omeprazole (PRILOSEC) 20 MG capsule, Take 20 mg by mouth daily. (Patient not taking: Reported on 07/04/2020), Disp: , Rfl:  .  traZODone (DESYREL) 150 MG tablet, Take 1 tablet (150 mg total) by mouth at bedtime., Disp: 90 tablet, Rfl: 1  Review of Systems:  Constitutional: Denies fever, chills, diaphoresis, appetite change and fatigue.  HEENT: Denies  photophobia, eye pain, redness, hearing loss, ear pain, congestion, sore throat, rhinorrhea, sneezing, mouth sores, trouble swallowing, neck pain, neck stiffness and tinnitus.   Respiratory: Denies SOB, DOE, cough, chest tightness,  and wheezing.   Cardiovascular: Denies chest pain, palpitations and leg swelling.  Gastrointestinal: Denies nausea, vomiting, abdominal pain, diarrhea, constipation, blood in stool and abdominal distention.  Genitourinary: Denies dysuria, urgency, frequency, hematuria, flank pain and difficulty urinating.  Endocrine: Denies: hot or cold intolerance, sweats, changes in hair or nails, polyuria, polydipsia. Musculoskeletal: Denies myalgias, back pain, joint swelling, arthralgias and gait problem.  Skin: Denies pallor, rash and wound.  Neurological: Denies dizziness, seizures, syncope, weakness, light-headedness, numbness and headaches.  Hematological: Denies adenopathy. Easy bruising, personal or family bleeding history  Psychiatric/Behavioral: Denies suicidal  ideation, mood changes, confusion, nervousness, sleep disturbance and agitation    Physical Exam: Vitals:   07/04/20 0917  BP: 120/80  Pulse: (!) 54  Temp: 97.6 F (36.4 C)  TempSrc: Oral  SpO2: 98%  Weight: (!) 423 lb 12.8 oz (192.2 kg)    Body mass index is 55.91 kg/m.  Constitutional: NAD, calm, comfortable, morbidly obese Eyes: PERRL, lids and conjunctivae normal ENMT: Mucous membranes are moist. Respiratory: clear to auscultation bilaterally, no wheezing, no crackles. Normal respiratory effort. No accessory muscle use.  Cardiovascular: Regular rate and rhythm, no murmurs / rubs / gallops. No extremity edema.   Neurologic: Grossly intact and nonfocal. Psychiatric: Normal judgment and insight. Alert and oriented x 3. Normal mood.    Impression and Plan:  IGT (impaired glucose tolerance)  -A1c is 5.8 today, continue to monitor.  Recurrent major depressive disorder, in full remission ([place])   - Plan: buPROPion (WELLBUTRIN XL) 150 MG 24 hr tablet  Super obesity -After discussion with patient, I will refer him to bariatric surgery.  I think at the end of the day his morbid obesity is at the etiology of most of his medical conditions. -If surgeon agrees that weight loss surgery would be beneficial, I think we should get cardiology clearance for it which might be difficult given his combined heart failure.  Obesity hypoventilation syndrome (HCC) OSA and COPD overlap syndrome ([place]) -Noncompliant with CPAP. -I see no point in referring him for an updated sleep study if he will not wear CPAP. -I suspect that a lot of her sleeping difficulties are related to this.  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, unspecified congestive heart failure chronicity ([place]) -Followed by cardiology. -On ARN I, beta-blocker, aspirin, Lasix, spironolactone.  Essential hypertension -Well-controlled on current regimen.  Osteoarthritis, unspecified osteoarthritis type, unspecified site -Significant of his knees.  TOBACCO USE -Continues to smoke about a full pack a day. -He is not interested in smoking cessation.  Insomnia, unspecified type  - Plan: traZODone (DESYREL) 150 MG tablet   Patient Instructions  -Nice seeing you today!!  -Schedule follow up in 3 months for your physical.     Lelon Frohlich, MD [facility]

## 2020-07-04 NOTE — Patient Instructions (Signed)
-  Nice seeing you today!!  -Schedule follow up in 3 months for your physical.

## 2020-07-04 NOTE — Patient Instructions (Signed)

## 2020-07-05 ENCOUNTER — Other Ambulatory Visit: Payer: Self-pay | Admitting: *Deleted

## 2020-07-05 DIAGNOSIS — I5040 Unspecified combined systolic (congestive) and diastolic (congestive) heart failure: Secondary | ICD-10-CM

## 2020-07-05 LAB — BASIC METABOLIC PANEL
BUN/Creatinine Ratio: 10 (ref 10–24)
BUN: 10 mg/dL (ref 8–27)
CO2: 22 mmol/L (ref 20–29)
Calcium: 9 mg/dL (ref 8.6–10.2)
Chloride: 100 mmol/L (ref 96–106)
Creatinine, Ser: 0.97 mg/dL (ref 0.76–1.27)
GFR calc Af Amer: 90 mL/min/{1.73_m2} (ref >59)
GFR calc non Af Amer: 78 mL/min/{1.73_m2} (ref >59)
Glucose: 75 mg/dL (ref 65–99)
Potassium: 5.3 mmol/L — ABNORMAL HIGH (ref 3.5–5.2)
Sodium: 138 mmol/L (ref 134–144)

## 2020-07-19 DIAGNOSIS — I5040 Unspecified combined systolic (congestive) and diastolic (congestive) heart failure: Secondary | ICD-10-CM | POA: Diagnosis not present

## 2020-07-20 LAB — BASIC METABOLIC PANEL
BUN/Creatinine Ratio: 13 (ref 10–24)
BUN: 11 mg/dL (ref 8–27)
CO2: 23 mmol/L (ref 20–29)
Calcium: 9.2 mg/dL (ref 8.6–10.2)
Chloride: 100 mmol/L (ref 96–106)
Creatinine, Ser: 0.85 mg/dL (ref 0.76–1.27)
Glucose: 86 mg/dL (ref 65–99)
Potassium: 4.6 mmol/L (ref 3.5–5.2)
Sodium: 138 mmol/L (ref 134–144)
eGFR: 93 mL/min/{1.73_m2} (ref >59)

## 2020-07-24 ENCOUNTER — Encounter: Payer: Self-pay | Admitting: *Deleted

## 2020-07-25 ENCOUNTER — Encounter: Payer: Self-pay | Admitting: Gastroenterology

## 2020-07-28 ENCOUNTER — Telehealth: Payer: Self-pay | Admitting: Internal Medicine

## 2020-07-28 NOTE — Progress Notes (Signed)
  Chronic Care Management   Outreach Note  07/28/2020 Name: Derrick Mata MRN: [id number] DOB: 04-14-1951  Referred by: Isaac Bliss, Rayford Halsted, MD Reason for referral : No chief complaint on file.   An unsuccessful telephone outreach was attempted today. The patient was referred to the pharmacist for assistance with care management and care coordination.   Follow Up Plan:   Carley Perdue [employer] Scheduler

## 2020-07-31 ENCOUNTER — Telehealth: Payer: Self-pay | Admitting: Internal Medicine

## 2020-07-31 NOTE — Telephone Encounter (Signed)
omeprazole (PRILOSEC) 20 MG capsule    [facility] [place], [place] [address] Phone:  [phone]  Fax:  [phone]

## 2020-07-31 NOTE — Progress Notes (Signed)
  Chronic Care Management   Outreach Note  07/31/2020 Name: Derrick Mata MRN: [id number] DOB: July 24, 1950  Referred by: Isaac Bliss, Rayford Halsted, MD Reason for referral : No chief complaint on file.   A second unsuccessful telephone outreach was attempted today. The patient was referred to pharmacist for assistance with care management and care coordination.  Follow Up Plan:   Carley Perdue [employer] Scheduler

## 2020-08-01 ENCOUNTER — Other Ambulatory Visit: Payer: Self-pay | Admitting: *Deleted

## 2020-08-01 DIAGNOSIS — I5040 Unspecified combined systolic (congestive) and diastolic (congestive) heart failure: Secondary | ICD-10-CM

## 2020-08-01 MED ORDER — ENTRESTO 24-26 MG PO TABS: 1.0000 | ORAL_TABLET | Freq: Two times a day (BID) | ORAL | 3 refills | Status: DC

## 2020-08-01 MED ORDER — OMEPRAZOLE 20 MG PO CPDR: 20.0000 mg | DELAYED_RELEASE_CAPSULE | Freq: Every day | ORAL | 1 refills | Status: DC

## 2020-08-01 NOTE — Telephone Encounter (Signed)
Refill sent.

## 2020-08-01 NOTE — Telephone Encounter (Signed)
Yes

## 2020-08-01 NOTE — Addendum Note (Signed)
Addended by: Westley Hummer B on: 08/01/2020 12:06 PM   Modules accepted: Orders

## 2020-08-01 NOTE — Telephone Encounter (Signed)
This is a historical med.  Okay to refill? °

## 2020-08-03 ENCOUNTER — Encounter: Payer: Self-pay | Admitting: Internal Medicine

## 2020-08-11 ENCOUNTER — Other Ambulatory Visit: Payer: Self-pay | Admitting: Internal Medicine

## 2020-08-17 ENCOUNTER — Encounter: Payer: Self-pay | Admitting: Internal Medicine

## 2020-10-04 ENCOUNTER — Encounter: Payer: Medicare Other | Admitting: Internal Medicine

## 2020-10-17 ENCOUNTER — Telehealth: Payer: Self-pay

## 2020-10-17 NOTE — Telephone Encounter (Signed)
Dr.Perry,  This patient has been scheduled for a  PV appt on 10/26/2020.  His BMI is 55.56 as of 07/04/2020.  Would you like this patient to be scheduled for a direct at [facility] or to be scheduled for an OV? Please advise

## 2020-10-17 NOTE — Telephone Encounter (Signed)
Routine office visit with me.  Thank you

## 2020-10-20 NOTE — Telephone Encounter (Signed)
Late entry=Called and spoke with patient =made an OV with Dr. [name];

## 2020-10-27 ENCOUNTER — Ambulatory Visit (INDEPENDENT_AMBULATORY_CARE_PROVIDER_SITE_OTHER): Payer: Medicare Other | Admitting: Internal Medicine

## 2020-10-27 ENCOUNTER — Encounter: Payer: Self-pay | Admitting: Internal Medicine

## 2020-10-27 ENCOUNTER — Other Ambulatory Visit: Payer: Self-pay

## 2020-10-27 VITALS — BP 130/80 | HR 63 | Temp 97.5°F | Wt >= 6400 oz

## 2020-10-27 DIAGNOSIS — I10 Essential (primary) hypertension: Secondary | ICD-10-CM | POA: Diagnosis not present

## 2020-10-27 DIAGNOSIS — R7302 Impaired glucose tolerance (oral): Secondary | ICD-10-CM | POA: Diagnosis not present

## 2020-10-27 DIAGNOSIS — F17210 Nicotine dependence, cigarettes, uncomplicated: Secondary | ICD-10-CM | POA: Diagnosis not present

## 2020-10-27 DIAGNOSIS — K7460 Unspecified cirrhosis of liver: Secondary | ICD-10-CM

## 2020-10-27 DIAGNOSIS — E669 Obesity, unspecified: Secondary | ICD-10-CM | POA: Diagnosis not present

## 2020-10-27 DIAGNOSIS — Z0000 Encounter for general adult medical examination without abnormal findings: Secondary | ICD-10-CM | POA: Diagnosis not present

## 2020-10-27 DIAGNOSIS — F3342 Major depressive disorder, recurrent, in full remission: Secondary | ICD-10-CM | POA: Diagnosis not present

## 2020-10-27 DIAGNOSIS — G4733 Obstructive sleep apnea (adult) (pediatric): Secondary | ICD-10-CM | POA: Diagnosis not present

## 2020-10-27 LAB — LIPID PANEL
Cholesterol: 170 mg/dL (ref 0–200)
HDL: 37.3 mg/dL — ABNORMAL LOW (ref >39.00)
LDL Cholesterol: 118 mg/dL — ABNORMAL HIGH (ref 0–99)
NonHDL: 133.01
Total CHOL/HDL Ratio: 5
Triglycerides: 73 mg/dL (ref 0.0–149.0)
VLDL: 14.6 mg/dL (ref 0.0–40.0)

## 2020-10-27 LAB — CBC WITH DIFFERENTIAL/PLATELET
Basophils Absolute: 0 10*3/uL (ref 0.0–0.1)
Basophils Relative: 0.5 % (ref 0.0–3.0)
Eosinophils Absolute: 0.1 10*3/uL (ref 0.0–0.7)
Eosinophils Relative: 1.2 % (ref 0.0–5.0)
HCT: 42.6 % (ref 39.0–52.0)
Hemoglobin: 14.2 g/dL (ref 13.0–17.0)
Lymphocytes Relative: 24.9 % (ref 12.0–46.0)
Lymphs Abs: 2.1 10*3/uL (ref 0.7–4.0)
MCHC: 33.2 g/dL (ref 30.0–36.0)
MCV: 84.7 fl (ref 78.0–100.0)
Monocytes Absolute: 0.4 10*3/uL (ref 0.1–1.0)
Monocytes Relative: 5.3 % (ref 3.0–12.0)
Neutro Abs: 5.7 10*3/uL (ref 1.4–7.7)
Neutrophils Relative %: 68.1 % (ref 43.0–77.0)
Platelets: 277 10*3/uL (ref 150.0–400.0)
RBC: 5.03 Mil/uL (ref 4.22–5.81)
RDW: 14.8 % (ref 11.5–15.5)
WBC: 8.3 10*3/uL (ref 4.0–10.5)

## 2020-10-27 LAB — COMPREHENSIVE METABOLIC PANEL
ALT: 7 U/L (ref 0–53)
AST: 11 U/L (ref 0–37)
Albumin: 3.7 g/dL (ref 3.5–5.2)
Alkaline Phosphatase: 54 U/L (ref 39–117)
BUN: 11 mg/dL (ref 6–23)
CO2: 28 mEq/L (ref 19–32)
Calcium: 9.1 mg/dL (ref 8.4–10.5)
Chloride: 105 mEq/L (ref 96–112)
Creatinine, Ser: 0.88 mg/dL (ref 0.40–1.50)
GFR: 86.33 mL/min (ref >60.00)
Glucose, Bld: 85 mg/dL (ref 70–99)
Potassium: 4.9 mEq/L (ref 3.5–5.1)
Sodium: 141 mEq/L (ref 135–145)
Total Bilirubin: 0.5 mg/dL (ref 0.2–1.2)
Total Protein: 7.1 g/dL (ref 6.0–8.3)

## 2020-10-27 LAB — VITAMIN D 25 HYDROXY (VIT D DEFICIENCY, FRACTURES): VITD: 34.48 ng/mL (ref 30.00–100.00)

## 2020-10-27 LAB — VITAMIN B12: Vitamin B-12: 445 pg/mL (ref 211–911)

## 2020-10-27 LAB — HEMOGLOBIN A1C: Hgb A1c MFr Bld: 5.9 % (ref 4.6–6.5)

## 2020-10-27 LAB — TSH: TSH: 2.06 u[IU]/mL (ref 0.35–4.50)

## 2020-10-27 NOTE — Patient Instructions (Signed)
-Nice seeing you today!!  -Lab work today; will notify you once results are available.  -Remember your 4th COVID vaccine and make sure you have had a tetanus booster.  -Schedule follow up in 6 months.  Preventive Care 31 Years and Older, Male Preventive care refers to lifestyle choices and visits with your health care provider that can promote health and wellness. This includes: A yearly physical exam. This is also called an annual wellness visit. Regular dental and eye exams. Immunizations. Screening for certain conditions. Healthy lifestyle choices, such as: Eating a healthy diet. Getting regular exercise. Not using drugs or products that contain nicotine and tobacco. Limiting alcohol use. What can I expect for my preventive care visit? Physical exam Your health care provider will check your: Height and weight. These may be used to calculate your BMI (body mass index). BMI is a measurement that tells if you are at a healthy weight. Heart rate and blood pressure. Body temperature. Skin for abnormal spots. Counseling Your health care provider may ask you questions about your: Past medical problems. Family's medical history. Alcohol, tobacco, and drug use. Emotional well-being. Home life and relationship well-being. Sexual activity. Diet, exercise, and sleep habits. History of falls. Memory and ability to understand (cognition). Work and work Statistician. Access to firearms. What immunizations do I need?  Vaccines are usually given at various ages, according to a schedule. Your health care provider will recommend vaccines for you based on your age, medicalhistory, and lifestyle or other factors, such as travel or where you work. What tests do I need? Blood tests Lipid and cholesterol levels. These may be checked every 5 years, or more often depending on your overall health. Hepatitis C test. Hepatitis B test. Screening Lung cancer screening. You may have this screening  every year starting at age 45 if you have a 30-pack-year history of smoking and currently smoke or have quit within the past 15 years. Colorectal cancer screening. All adults should have this screening starting at age 93 and continuing until age 73. Your health care provider may recommend screening at age 45 if you are at increased risk. You will have tests every 1-10 years, depending on your results and the type of screening test. Prostate cancer screening. Recommendations will vary depending on your family history and other risks. Genital exam to check for testicular cancer or hernias. Diabetes screening. This is done by checking your blood sugar (glucose) after you have not eaten for a while (fasting). You may have this done every 1-3 years. Abdominal aortic aneurysm (AAA) screening. You may need this if you are a current or former smoker. STD (sexually transmitted disease) testing, if you are at risk. Follow these instructions at home: Eating and drinking  Eat a diet that includes fresh fruits and vegetables, whole grains, lean protein, and low-fat dairy products. Limit your intake of foods with high amounts of sugar, saturated fats, and salt. Take vitamin and mineral supplements as recommended by your health care provider. Do not drink alcohol if your health care provider tells you not to drink. If you drink alcohol: Limit how much you have to 0-2 drinks a day. Be aware of how much alcohol is in your drink. In the [place] one drink equals one 12 oz bottle of beer (355 mL), one 5 oz glass of wine (148 mL), or one 1 oz glass of hard liquor (44 mL).  Lifestyle Take daily care of your teeth and gums. Brush your teeth every morning and night with  fluoride toothpaste. Floss one time each day. Stay active. Exercise for at least 30 minutes 5 or more days each week. Do not use any products that contain nicotine or tobacco, such as cigarettes, e-cigarettes, and chewing tobacco. If you need help  quitting, ask your health care provider. Do not use drugs. If you are sexually active, practice safe sex. Use a condom or other form of protection to prevent STIs (sexually transmitted infections). Talk with your health care provider about taking a low-dose aspirin or statin. Find healthy ways to cope with stress, such as: Meditation, yoga, or listening to music. Journaling. Talking to a trusted person. Spending time with friends and family. Safety Always wear your seat belt while driving or riding in a vehicle. Do not drive: If you have been drinking alcohol. Do not ride with someone who has been drinking. When you are tired or distracted. While texting. Wear a helmet and other protective equipment during sports activities. If you have firearms in your house, make sure you follow all gun safety procedures. What's next? Visit your health care provider once a year for an annual wellness visit. Ask your health care provider how often you should have your eyes and teeth checked. Stay up to date on all vaccines. This information is not intended to replace advice given to you by your health care provider. Make sure you discuss any questions you have with your healthcare provider. Document Revised: 02/02/2019 Document Reviewed: 04/30/2018 Elsevier Patient Education  2022 [employer].

## 2020-10-27 NOTE — Progress Notes (Signed)
Established Patient Office Visit     This visit occurred during the SARS-CoV-2 public health emergency.  Safety protocols were in place, including screening questions prior to the visit, additional usage of staff PPE, and extensive cleaning of exam room while observing appropriate contact time as indicated for disinfecting solutions.    CC/Reason for Visit: Annual preventive exam and subsequent Medicare wellness visit  HPI: Derrick Mata is a 70 y.o. male who is coming in today for the above mentioned reasons. Past Medical History is significant for:   1. Chronic combined CHF.  Followed by Dr. Stanford Breed, most recent medication changes include addition of Entresto.   2. Morbid Obesity with a BMI of 59.81. He states that with COVID-19 he has gained about 20 pounds. States he cannot exercise much because of his knee OA.   3. OSA, refuses CPAP use.   4. Depression well controlled on wellbutrin.   5. GERD no symptoms on omeprazole.   6. Hep C Cirrhosis, completed treatment with [facility]. Gi is Dr. [name].   7. Ongoing tobacco abuse of at least 1 PPD if not more. He states he chain smokes from 8 am till about 8 pm every day.   8.  Impaired glucose tolerance.  He has routine eye and dental care wishes, he does not exercise.  He has had 3 COVID vaccines is due for his second COVID booster and his Tdap update.  He had a colonoscopy in 2019 but is a 3-year callback.  He already has his colonoscopy scheduled for later this month.   Past Medical/Surgical History: Past Medical History:  Diagnosis Date   Arthritis    OA of knees   CHF (congestive heart failure) (HCC)    Closed compression fracture of L1 lumbar vertebra 09/03/2016   Colon polyp    Complication of anesthesia    slow to wake up   COPD (chronic obstructive pulmonary disease) (HCC)    > 30 pack yr smoker   Depression    Anxiety and Insomnia   Diverticulosis    Erectile dysfunction    GERD (gastroesophageal reflux  disease)    Hemorrhoids    Hep C w/o coma, chronic ([place]) 06/12/2015   treated with medications   Leg edema    Morbid obesity ([place])    Sleep apnea    refuses CPAP   Substance abuse ([place])    hx alcohol and cocaine use, 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, 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    Past Surgical History:  Procedure Laterality Date   COLONOSCOPY  04/18/2011   Procedure: COLONOSCOPY;  Surgeon: Scarlette Shorts, MD;  Location: [facility];  Service: Endoscopy;  Laterality: N/A;   COLONOSCOPY WITH PROPOFOL N/A 05/07/2016   Procedure: COLONOSCOPY WITH PROPOFOL;  Surgeon: Irene Shipper, MD;  Location: [facility];  Service: Endoscopy;  Laterality: N/A;   COLONOSCOPY WITH PROPOFOL N/A 08/12/2017   Procedure: COLONOSCOPY WITH PROPOFOL;  Surgeon: Irene Shipper, MD;  Location: [facility];  Service: Endoscopy;  Laterality: N/A;   ESOPHAGOGASTRODUODENOSCOPY (EGD) WITH PROPOFOL N/A 05/07/2016   Procedure: ESOPHAGOGASTRODUODENOSCOPY (EGD) WITH PROPOFOL;  Surgeon: Irene Shipper, MD;  Location: [facility];  Service: Endoscopy;  Laterality: N/A;   KNEE ARTHROSCOPY  2011   left   LUMBAR LAMINECTOMY  1994, 2010   [place] SURGERY  2006   hardware   TONSILLECTOMY  1963    Social History:  reports that he has been smoking cigarettes. He started smoking about  57 years ago. He has a 51.00 pack-year smoking history. He has never used smokeless tobacco. He reports that he does not drink alcohol and does not use drugs.  Allergies: Allergies  Allergen Reactions   Penicillins Anaphylaxis, Nausea Only and Other (See Comments)    Passed out. Has patient had a PCN reaction causing immediate rash, facial/tongue/throat swelling, SOB or lightheadedness with hypotension: {no Has patient had a PCN reaction causing severe rash involving mucus membranes or skin necrosis: {no Has patient had a PCN reaction that required hospitalization {no Has patient had a PCN reaction occurring within the last 10 years:  {no If all of the above answers are "NO", then may proceed with Cephalosporin use.   Sulfamethoxazole Hives    Family History:  Family History  Problem Relation Age of Onset   Colon cancer Sister    Cancer Sister 76       sister, colon    Breast cancer Mother    Heart disease Other    Heart disease Father 2   Stomach cancer Neg Hx    Rectal cancer Neg Hx      Current Outpatient Medications:    aspirin EC 81 MG tablet, Take 81 mg by mouth at bedtime., Disp: , Rfl:    buPROPion (WELLBUTRIN XL) 150 MG 24 hr tablet, TAKE 1 TABLET(150 MG) BY MOUTH DAILY, Disp: 90 tablet, Rfl: 1   diclofenac sodium (VOLTAREN) 1 % GEL, Apply 2 g topically 2 (two) times daily., Disp: 300 g, Rfl: 3   furosemide (LASIX) 40 MG tablet, TAKE 1 TABLET(40 MG) BY MOUTH DAILY, Disp: 90 tablet, Rfl: 1   metoprolol succinate (TOPROL-XL) 25 MG 24 hr tablet, TAKE 1 TABLET(25 MG) BY MOUTH DAILY, Disp: 90 tablet, Rfl: 3   Multiple Vitamin (MULTIVITAMIN WITH MINERALS) TABS tablet, Take 1 tablet by mouth daily., Disp: , Rfl:    omeprazole (PRILOSEC) 20 MG capsule, Take 1 capsule (20 mg total) by mouth daily., Disp: 90 capsule, Rfl: 1   sacubitril-valsartan (ENTRESTO) 24-26 MG, Take 1 tablet by mouth 2 (two) times daily., Disp: 180 tablet, Rfl: 3   spironolactone (ALDACTONE) 25 MG tablet, Take 1 tablet (25 mg total) by mouth daily., Disp: 90 tablet, Rfl: 3   traZODone (DESYREL) 150 MG tablet, Take 1 tablet (150 mg total) by mouth at bedtime., Disp: 90 tablet, Rfl: 1  Review of Systems:  Constitutional: Denies fever, chills, diaphoresis, appetite change and fatigue.  HEENT: Denies photophobia, eye pain, redness, hearing loss, ear pain, congestion, sore throat, rhinorrhea, sneezing, mouth sores, trouble swallowing, neck pain, neck stiffness and tinnitus.   Respiratory: Denies SOB, DOE, cough, chest tightness,  and wheezing.   Cardiovascular: Denies chest pain, palpitations and leg swelling.  Gastrointestinal: Denies nausea,  vomiting, abdominal pain, diarrhea, constipation, blood in stool and abdominal distention.  Genitourinary: Denies dysuria, urgency, frequency, hematuria, flank pain and difficulty urinating.  Endocrine: Denies: hot or cold intolerance, sweats, changes in hair or nails, polyuria, polydipsia. Musculoskeletal: Denies myalgias, back pain, joint swelling, arthralgias and gait problem.  Skin: Denies pallor, rash and wound.  Neurological: Denies dizziness, seizures, syncope, weakness, light-headedness, numbness and headaches.  Hematological: Denies adenopathy. Easy bruising, personal or family bleeding history  Psychiatric/Behavioral: Denies suicidal ideation, mood changes, confusion, nervousness, sleep disturbance and agitation    Physical Exam: Vitals:   10/27/20 1024  BP: 130/80  Pulse: 63  Temp: (!) 97.5 F (36.4 C)  TempSrc: Oral  SpO2: 97%  Weight: (!) 421 lb 11.2 oz (191.3 kg)  Body mass index is 55.64 kg/m.   Constitutional: NAD, calm, comfortable, morbidly obese Eyes: PERRL, lids and conjunctivae normal ENMT: Mucous membranes are moist. Posterior pharynx clear of any exudate or lesions. Normal dentition. Tympanic membrane is pearly white, no erythema or bulging. Neck: normal, supple, no masses, no thyromegaly Respiratory: clear to auscultation bilaterally, no wheezing, no crackles. Normal respiratory effort. No accessory muscle use.  Cardiovascular: Regular rate and rhythm, no murmurs / rubs / gallops. No extremity edema. 2+ pedal pulses. No carotid bruits.  Abdomen: no tenderness, no masses palpated. No hepatosplenomegaly. Bowel sounds positive.  Musculoskeletal: no clubbing / cyanosis. No joint deformity upper and lower extremities. Good ROM, no contractures. Normal muscle tone.  Skin: no rashes, lesions, ulcers. No induration Neurologic: CN 2-12 grossly intact. Sensation intact, DTR normal. Strength 5/5 in all 4.  Psychiatric: Normal judgment and insight. Alert and  oriented x 3. Normal mood.    Subsequent Medicare wellness visit   1. Risk factors, based on past  M,S,F -cardiovascular disease risk factors include age, gender, morbid obesity, history of heart failure, history of tobacco abuse   2.  Physical activities: Very sedentary   3.  Depression/mood: History of depression but mood is stable   4.  Hearing: No perceived issues   5.  ADL's: Independent in all ADLs   6.  Fall risk: Low fall risk   7.  Home safety: No problems identified   8.  Height weight, and visual acuity: height and weight as above, vision:  Vision Screening   Right eye Left eye Both eyes  Without correction 20/50 20/32 20/32   With correction        9.  Counseling: Advise healthy lifestyle changes   10. Lab orders based on risk factors: Laboratory update will be reviewed   11. Referral : None today   12. Care plan: Follow-up with me in 6 months   13. Cognitive assessment: No cognitive impairment   14. Screening: Patient provided with a written and personalized 5-10 year screening schedule in the AVS. yes   15. Provider List Update: PCP, cardiologist Dr. Stanford Breed  16. Advance Directives: Full code   17. Opioids: Patient is not on any opioid prescriptions and has no risk factors for a substance use disorder.   [place] Office Visit from 10/27/2020 in [place] at [facility]  PHQ-9 Total Score 1       Fall Risk  10/27/2020 12/08/2018 12/08/2018 10/09/2018 01/03/2017  Falls in the past year? 0 0 0 0 No  Number falls in past yr: 0 0 0 0 -  Injury with Fall? 0 0 0 0 -  Risk for fall due to : - - - - -  Follow up - - - - -     Impression and Plan:  Encounter for preventive health examination -Advised routine eye and dental care. -He is due for his second COVID booster as well as his Tdap, otherwise immunizations are up-to-date. -Screening labs today. -Healthy lifestyle discussed in detail. -He has a colonoscopy scheduled for later this  month as a follow-up for his colonoscopy in 2019.  IGT (impaired glucose tolerance)  - Plan: Hemoglobin A1c -Last A1c was 5.8 in February 2022.  Recurrent major depressive disorder, in full remission ([place]) -  Plan: TSH, Vitamin B12, VITAMIN D 25 Hydroxy (Vit-D Deficiency, Fractures) -Mood is stable, on Wellbutrin, trazodone.  Cirrhosis of liver without ascites, unspecified hepatic cirrhosis type ([place]) -Noted, has completed treatment for hepatitis C.  Super  obesity -Discussed healthy lifestyle, including increased physical activity and better food choices to promote weight loss.  Essential hypertension  - Plan: CBC with Differential/Platelet, Comprehensive metabolic panel, Lipid panel -Fair blood pressure control in office today.  OSA (obstructive sleep apnea) -Has refused CPAP use.  Cigarette nicotine dependence without complication -He is not interested in smoking cessation.   Patient Instructions  -Nice seeing you today!!  -Lab work today; will notify you once results are available.  -Remember your 4th COVID vaccine and make sure you have had a tetanus booster.  -Schedule follow up in 6 months.  Preventive Care 93 Years and Older, Male Preventive care refers to lifestyle choices and visits with your health care provider that can promote health and wellness. This includes: A yearly physical exam. This is also called an annual wellness visit. Regular dental and eye exams. Immunizations. Screening for certain conditions. Healthy lifestyle choices, such as: Eating a healthy diet. Getting regular exercise. Not using drugs or products that contain nicotine and tobacco. Limiting alcohol use. What can I expect for my preventive care visit? Physical exam Your health care provider will check your: Height and weight. These may be used to calculate your BMI (body mass index). BMI is a measurement that tells if you are at a healthy weight. Heart rate and blood pressure. Body  temperature. Skin for abnormal spots. Counseling Your health care provider may ask you questions about your: Past medical problems. Family's medical history. Alcohol, tobacco, and drug use. Emotional well-being. Home life and relationship well-being. Sexual activity. Diet, exercise, and sleep habits. History of falls. Memory and ability to understand (cognition). Work and work Statistician. Access to firearms. What immunizations do I need?  Vaccines are usually given at various ages, according to a schedule. Your health care provider will recommend vaccines for you based on your age, medicalhistory, and lifestyle or other factors, such as travel or where you work. What tests do I need? Blood tests Lipid and cholesterol levels. These may be checked every 5 years, or more often depending on your overall health. Hepatitis C test. Hepatitis B test. Screening Lung cancer screening. You may have this screening every year starting at age 21 if you have a 30-pack-year history of smoking and currently smoke or have quit within the past 15 years. Colorectal cancer screening. All adults should have this screening starting at age 52 and continuing until age 59. Your health care provider may recommend screening at age 55 if you are at increased risk. You will have tests every 1-10 years, depending on your results and the type of screening test. Prostate cancer screening. Recommendations will vary depending on your family history and other risks. Genital exam to check for testicular cancer or hernias. Diabetes screening. This is done by checking your blood sugar (glucose) after you have not eaten for a while (fasting). You may have this done every 1-3 years. Abdominal aortic aneurysm (AAA) screening. You may need this if you are a current or former smoker. STD (sexually transmitted disease) testing, if you are at risk. Follow these instructions at home: Eating and drinking  Eat a diet that  includes fresh fruits and vegetables, whole grains, lean protein, and low-fat dairy products. Limit your intake of foods with high amounts of sugar, saturated fats, and salt. Take vitamin and mineral supplements as recommended by your health care provider. Do not drink alcohol if your health care provider tells you not to drink. If you drink alcohol: Limit how much you have  to 0-2 drinks a day. Be aware of how much alcohol is in your drink. In the [place] one drink equals one 12 oz bottle of beer (355 mL), one 5 oz glass of wine (148 mL), or one 1 oz glass of hard liquor (44 mL).  Lifestyle Take daily care of your teeth and gums. Brush your teeth every morning and night with fluoride toothpaste. Floss one time each day. Stay active. Exercise for at least 30 minutes 5 or more days each week. Do not use any products that contain nicotine or tobacco, such as cigarettes, e-cigarettes, and chewing tobacco. If you need help quitting, ask your health care provider. Do not use drugs. If you are sexually active, practice safe sex. Use a condom or other form of protection to prevent STIs (sexually transmitted infections). Talk with your health care provider about taking a low-dose aspirin or statin. Find healthy ways to cope with stress, such as: Meditation, yoga, or listening to music. Journaling. Talking to a trusted person. Spending time with friends and family. Safety Always wear your seat belt while driving or riding in a vehicle. Do not drive: If you have been drinking alcohol. Do not ride with someone who has been drinking. When you are tired or distracted. While texting. Wear a helmet and other protective equipment during sports activities. If you have firearms in your house, make sure you follow all gun safety procedures. What's next? Visit your health care provider once a year for an annual wellness visit. Ask your health care provider how often you should have your eyes and teeth  checked. Stay up to date on all vaccines. This information is not intended to replace advice given to you by your health care provider. Make sure you discuss any questions you have with your healthcare provider. Document Revised: 02/02/2019 Document Reviewed: 04/30/2018 Elsevier Patient Education  2022 [place], MD [facility]

## 2020-10-31 ENCOUNTER — Other Ambulatory Visit: Payer: Self-pay

## 2020-10-31 DIAGNOSIS — Z13220 Encounter for screening for lipoid disorders: Secondary | ICD-10-CM

## 2020-10-31 NOTE — Progress Notes (Signed)
Left detailed message for pt to call the office to review his lab results

## 2020-11-09 ENCOUNTER — Encounter: Payer: Medicare Other | Admitting: Internal Medicine

## 2020-11-09 ENCOUNTER — Other Ambulatory Visit: Payer: Self-pay | Admitting: Internal Medicine

## 2020-11-09 DIAGNOSIS — G4700 Insomnia, unspecified: Secondary | ICD-10-CM

## 2020-11-16 ENCOUNTER — Telehealth: Payer: Self-pay | Admitting: Internal Medicine

## 2020-11-16 DIAGNOSIS — Z87891 Personal history of nicotine dependence: Secondary | ICD-10-CM

## 2020-11-16 DIAGNOSIS — F17210 Nicotine dependence, cigarettes, uncomplicated: Secondary | ICD-10-CM

## 2020-11-16 NOTE — Telephone Encounter (Signed)
pt wants to know when he is to have his annual lung screening CT [phone]

## 2020-11-16 NOTE — Chronic Care Management (AMB) (Signed)
   Chronic Care Management   Note  11/16/2020 Name: Khylen Riolo MRN: [id number] DOB: 04-21-1951  [name] Derrick Mata is a 70 y.o. year old male who is a primary care patient of Isaac Bliss, Rayford Halsted, MD. I reached out to Lawrence Santiago by phone today in response to a referral sent by Mr. Wallie Char Jr's PCP, Isaac Bliss, Rayford Halsted, MD.   [name] was given information about Chronic Care Management services today including:  CCM service includes personalized support from designated clinical staff supervised by his physician, including individualized plan of care and coordination with other care providers 24/7 contact phone numbers for assistance for urgent and routine care needs. Service will only be billed when office clinical staff spend 20 minutes or more in a month to coordinate care. Only one practitioner may furnish and bill the service in a calendar month. The patient may stop CCM services at any time (effective at the end of the month) by phone call to the office staff.   Patient agreed to services and verbal consent obtained.   Follow up plan:   Tatjana Secretary/administrator

## 2020-11-16 NOTE — Chronic Care Management (AMB) (Signed)
  Chronic Care Management   Outreach Note  11/16/2020 Name: Derrick Mata MRN: [id number] DOB: 31-Jan-1951  Referred by: Isaac Bliss, Rayford Halsted, MD Reason for referral : No chief complaint on file.   A second unsuccessful telephone outreach was attempted today. The patient was referred to pharmacist for assistance with care management and care coordination.  Follow Up Plan:   Tatjana Dellinger Upstream Scheduler

## 2020-11-21 NOTE — Telephone Encounter (Signed)
New CT order placed. Will forward to [facility]) to contact pt to schedule f/u CT.

## 2020-11-21 NOTE — Telephone Encounter (Signed)
I called the patient to schedule his LCS CT but I had to leave a message asking him to call me back

## 2020-11-21 NOTE — Telephone Encounter (Signed)
Ladies, please schedule patient. We need to make sure we have his contact info, because I am sure we attempted to call and schedule when he was last due. Thanks so much.

## 2020-11-29 NOTE — Telephone Encounter (Signed)
I had to leave another message asking the patient to call me back to schedule his LCS CT

## 2020-12-05 ENCOUNTER — Encounter: Payer: Self-pay | Admitting: Internal Medicine

## 2020-12-05 ENCOUNTER — Ambulatory Visit (INDEPENDENT_AMBULATORY_CARE_PROVIDER_SITE_OTHER): Payer: Medicare Other | Admitting: Internal Medicine

## 2020-12-05 VITALS — BP 114/72 | HR 72 | Ht 73.0 in | Wt >= 6400 oz

## 2020-12-05 DIAGNOSIS — Z86010 Personal history of colonic polyps: Secondary | ICD-10-CM

## 2020-12-05 DIAGNOSIS — K7460 Unspecified cirrhosis of liver: Secondary | ICD-10-CM

## 2020-12-05 DIAGNOSIS — E669 Obesity, unspecified: Secondary | ICD-10-CM | POA: Diagnosis not present

## 2020-12-05 MED ORDER — SUTAB 1479-225-188 MG PO TABS: 1.0000 | ORAL_TABLET | Freq: Once | ORAL | 0 refills | Status: AC

## 2020-12-05 NOTE — Patient Instructions (Signed)
If you are age 72 or older, your body mass index should be between 23-30. Your Body mass index is 54.65 kg/m. If this is out of the aforementioned range listed, please consider follow up with your Primary Care Provider.  If you are age 110 or younger, your body mass index should be between 19-25. Your Body mass index is 54.65 kg/m. If this is out of the aformentioned range listed, please consider follow up with your Primary Care Provider.   __________________________________________________________  The [facility] providers would like to encourage you to use [facility] to communicate with providers for non-urgent requests or questions.  Due to long hold times on the telephone, sending your provider a message by [facility] may be a faster and more efficient way to get a response.  Please allow 48 business hours for a response.  Please remember that this is for non-urgent requests.   You have been scheduled for an endoscopy and colonoscopy. Please follow the written instructions given to you at your visit today. Please pick up your prep supplies at the pharmacy within the next 1-3 days. If you use inhalers (even only as needed), please bring them with you on the day of your procedure.

## 2020-12-06 ENCOUNTER — Encounter: Payer: Self-pay | Admitting: Internal Medicine

## 2020-12-06 NOTE — Progress Notes (Signed)
HISTORY OF PRESENT ILLNESS:  Derrick Mata is a 70 y.o. male with multiple medical problems as listed below including history of congestive heart failure, COPD, sleep apnea, and morbid obesity with a BMI of 55.  His GI history is remarkable for GERD for which she is maintained on omeprazole 20 mg daily.  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  He also has a history of multiple adenomatous colon polyps.  He presents today regarding surveillance colonoscopy and surveillance upper endoscopy.  Patient tells me that from a GI standpoint he has been doing well on omeprazole.  No breakthrough reflux.  No dysphagia.  Review of blood work from October 27, 2020 shows normal comprehensive metabolic panel including liver tests.  Normal CBC with hemoglobin 14.2.  Platelets 277.  Limited ultrasound February 2019 revealed cholelithiasis.  The liver did not demonstrate any focal lesion.  Screening upper endoscopy was last performed December 2017.  The examination was normal.  No varices.  Follow-up in 3 years recommended.  Patient has a history of multiple adenomas.  Previous examinations 2002, 2003, 2007, 2012, 2017, 2019.  Last examination was 6 polyps.  Follow-up in 3 years recommended.  REVIEW OF SYSTEMS:  All non-GI ROS negative unless otherwise stated in the HPI except for arthritis  Past Medical History:  Diagnosis Date   Arthritis    OA of knees   CHF (congestive heart failure) (HCC)    Closed compression fracture of L1 lumbar vertebra 09/03/2016   Colon polyp    Complication of anesthesia    slow to wake up   COPD (chronic obstructive pulmonary disease) (HCC)    > 30 pack yr smoker   Depression    Anxiety and Insomnia   Diverticulosis    Erectile dysfunction    GERD (gastroesophageal reflux disease)    Hemorrhoids    Hep C w/o coma, chronic ([place]) 06/12/2015   treated with medications   Leg edema    Morbid obesity ([place])     Sleep apnea    refuses CPAP   Substance abuse ([place])    hx alcohol and cocaine use, 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, 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    Past Surgical History:  Procedure Laterality Date   COLONOSCOPY  04/18/2011   Procedure: COLONOSCOPY;  Surgeon: Scarlette Shorts, MD;  Location: [facility];  Service: Endoscopy;  Laterality: N/A;   COLONOSCOPY WITH PROPOFOL N/A 05/07/2016   Procedure: COLONOSCOPY WITH PROPOFOL;  Surgeon: Irene Shipper, MD;  Location: [facility];  Service: Endoscopy;  Laterality: N/A;   COLONOSCOPY WITH PROPOFOL N/A 08/12/2017   Procedure: COLONOSCOPY WITH PROPOFOL;  Surgeon: Irene Shipper, MD;  Location: [facility];  Service: Endoscopy;  Laterality: N/A;   ESOPHAGOGASTRODUODENOSCOPY (EGD) WITH PROPOFOL N/A 05/07/2016   Procedure: ESOPHAGOGASTRODUODENOSCOPY (EGD) WITH PROPOFOL;  Surgeon: Irene Shipper, MD;  Location: [facility];  Service: Endoscopy;  Laterality: N/A;   KNEE ARTHROSCOPY  2011   left   LUMBAR LAMINECTOMY  1994, 2010   [place]  2006   hardware   TONSILLECTOMY  1963    Social History Derrick Mata  reports that he has been smoking cigarettes. He started smoking about 57 years ago. He has a 51.00 pack-year smoking history. He has never used smokeless tobacco. He reports that he does not drink alcohol and does not use drugs.  family history includes Breast cancer in his mother; Cancer (  age of onset: 79) in his sister; Colon cancer in his sister; Heart disease in an other family member; Heart disease (age of onset: 71) in his father.  Allergies  Allergen Reactions   Penicillins Anaphylaxis, Nausea Only and Other (See Comments)    Passed out. Has patient had a PCN reaction causing immediate rash, facial/tongue/throat swelling, SOB or lightheadedness with hypotension: {no Has patient had a PCN reaction causing severe rash involving mucus membranes or skin necrosis: {no Has patient had a PCN reaction that required hospitalization  {no Has patient had a PCN reaction occurring within the last 10 years: {no If all of the above answers are "NO", then may proceed with Cephalosporin use.   Sulfamethoxazole Hives       PHYSICAL EXAMINATION: Vital signs: BP 114/72 (BP Location: Left Wrist, Patient Position: Sitting, Cuff Size: Normal)   Pulse 72   Ht 6\' 1"  (1.854 m)   Wt (!) 414 lb 4 oz (187.9 kg)   SpO2 99%   BMI 54.65 kg/m   Constitutional: Obese and unhealthy appearing, no acute distress Psychiatric: Pleasant, alert and oriented x3, cooperative Eyes: extraocular movements intact, anicteric, conjunctiva pink Mouth: oral pharynx moist, no lesions Neck: supple no lymphadenopathy Cardiovascular: heart regular rate and rhythm, no murmur Lungs: clear to auscultation bilaterally Abdomen: soft, obese, nontender, nondistended, no obvious ascites, no peritoneal signs, normal bowel sounds, no organomegaly Rectal: Deferred until colonoscopy Extremities: no clubbing or cyanosis.  1+ lower extremity edema bilaterally Skin: no lesions on visible extremities Neuro: No focal deficits. No asterixis.     ASSESSMENT:  1.  History of multiple adenomatous colon polyps.  Due for surveillance.  The patient is HIGH RISK given his comorbidities and body habitus. 2.  GERD.  Asymptomatic on PPI 3.  History of hepatic cirrhosis secondary to treated hepatitis C and alcohol.  Well compensated.  Last ultrasound was negative.  Last screening upper endoscopy was without varices.  Due for follow-up surveillance 4.  Multiple medical problems including CHF, COPD, and BMI 55   PLAN:  1.  Colonoscopy.  This will need to be performed at the hospital due to his comorbidities and body habitus.  Patient is high risk.The nature of the procedure, as well as the risks, benefits, and alternatives were carefully and thoroughly reviewed with the patient. Ample time for discussion and questions allowed. The patient understood, was satisfied, and agreed to  proceed.  2.  Upper endoscopy to screen for varices.  This will need to be performed at the hospital due to his comorbidities and body habitus.  The patient is high risk.The nature of the procedure, as well as the risks, benefits, and alternatives were carefully and thoroughly reviewed with the patient. Ample time for discussion and questions allowed. The patient understood, was satisfied, and agreed to proceed.  3.  Reflux precautions 4.  Continue PPI 5.  Ongoing general medical care with PCP and other specialists

## 2020-12-06 NOTE — Telephone Encounter (Signed)
I missed the patient's call and when I tried to call him back I had to leave another message asking him to return my call

## 2020-12-07 NOTE — Telephone Encounter (Signed)
I finally got to speak with [name] and his LCS CT has been scheduled on 12/15/2020 @ 9:30 at [place]

## 2020-12-15 ENCOUNTER — Other Ambulatory Visit: Payer: Self-pay

## 2020-12-15 ENCOUNTER — Ambulatory Visit (INDEPENDENT_AMBULATORY_CARE_PROVIDER_SITE_OTHER)
Admission: RE | Admit: 2020-12-15 | Discharge: 2020-12-15 | Disposition: A | Discharge status: Home / Self Care | Payer: Medicare Other | Source: Ambulatory Visit | Attending: Internal Medicine | Admitting: Internal Medicine

## 2020-12-15 DIAGNOSIS — F17210 Nicotine dependence, cigarettes, uncomplicated: Secondary | ICD-10-CM

## 2020-12-15 DIAGNOSIS — Z87891 Personal history of nicotine dependence: Secondary | ICD-10-CM | POA: Diagnosis not present

## 2020-12-28 NOTE — Progress Notes (Signed)
Please call patient and let them  know their  low dose Ct was read as a Lung RADS 2: nodules that are benign in appearance and behavior with a very low likelihood of becoming a clinically active cancer due to size or lack of growth. Recommendation per radiology is for a repeat LDCT in 12 months. .Please let them  know we will order and schedule their  annual screening scan for 11/2021. Please let them  know there was notation of CAD on their  scan.  Please remind the patient  that this is a non-gated exam therefore degree or severity of disease  cannot be determined. Please have them  follow up with their PCP regarding potential risk factor modification, dietary therapy or pharmacologic therapy if clinically indicated. Pt.  is not  currently on statin therapy. Please place order for annual  screening scan for  11/2021 and fax results to PCP. Thanks so much.  Derrick Mata, he has notation of Pulmonary artery enlargement suggests pulmonary arterial hypertension.He is followed by cards. Last echo was done 2020. Make sure he follow up with cardiology about this potential finding. With his sleep apnea this puts him at higher risk. I do not see a heart cath in his history. Thanks so much

## 2020-12-29 ENCOUNTER — Telehealth: Payer: Self-pay | Admitting: Internal Medicine

## 2021-01-01 DIAGNOSIS — M25561 Pain in right knee: Secondary | ICD-10-CM | POA: Diagnosis not present

## 2021-01-01 DIAGNOSIS — M25562 Pain in left knee: Secondary | ICD-10-CM | POA: Diagnosis not present

## 2021-01-02 NOTE — Telephone Encounter (Signed)
error 

## 2021-01-03 ENCOUNTER — Telehealth: Payer: Self-pay | Admitting: Acute Care

## 2021-01-03 DIAGNOSIS — F17210 Nicotine dependence, cigarettes, uncomplicated: Secondary | ICD-10-CM

## 2021-01-03 DIAGNOSIS — Z87891 Personal history of nicotine dependence: Secondary | ICD-10-CM

## 2021-01-04 NOTE — Telephone Encounter (Signed)
Pt informed of CT results per Sarah Groce, NP.  PT verbalized understanding.  Copy of CT sent to PCP.  Order placed for 1 yr f/u CT. ° °

## 2021-01-04 NOTE — Telephone Encounter (Signed)
Left message for pt to call back  °

## 2021-01-08 NOTE — Telephone Encounter (Signed)
error 

## 2021-01-15 ENCOUNTER — Ambulatory Visit: Payer: Medicare Other

## 2021-01-18 ENCOUNTER — Other Ambulatory Visit: Payer: Self-pay

## 2021-01-25 ENCOUNTER — Ambulatory Visit (HOSPITAL_COMMUNITY)
Admission: RE | Admit: 2021-01-25 | Discharge: 2021-01-25 | Disposition: A | Discharge status: Home / Self Care | Payer: Medicare Other | Attending: Internal Medicine | Admitting: Internal Medicine

## 2021-01-25 ENCOUNTER — Ambulatory Visit (HOSPITAL_COMMUNITY): Payer: Medicare Other | Admitting: Certified Registered"

## 2021-01-25 ENCOUNTER — Encounter (HOSPITAL_COMMUNITY): Payer: Self-pay | Admitting: Internal Medicine

## 2021-01-25 ENCOUNTER — Encounter (HOSPITAL_COMMUNITY)
Admission: RE | Disposition: A | Discharge status: Home / Self Care | Payer: Self-pay | Source: Home / Self Care | Attending: Internal Medicine

## 2021-01-25 DIAGNOSIS — Z882 Allergy status to sulfonamides status: Secondary | ICD-10-CM | POA: Insufficient documentation

## 2021-01-25 DIAGNOSIS — Z1211 Encounter for screening for malignant neoplasm of colon: Secondary | ICD-10-CM | POA: Insufficient documentation

## 2021-01-25 DIAGNOSIS — F17210 Nicotine dependence, cigarettes, uncomplicated: Secondary | ICD-10-CM | POA: Insufficient documentation

## 2021-01-25 DIAGNOSIS — Z803 Family history of malignant neoplasm of breast: Secondary | ICD-10-CM | POA: Insufficient documentation

## 2021-01-25 DIAGNOSIS — Z6841 Body Mass Index (BMI) 40.0 and over, adult: Secondary | ICD-10-CM | POA: Diagnosis not present

## 2021-01-25 DIAGNOSIS — K5730 Diverticulosis of large intestine without perforation or abscess without bleeding: Secondary | ICD-10-CM | POA: Diagnosis not present

## 2021-01-25 DIAGNOSIS — D125 Benign neoplasm of sigmoid colon: Secondary | ICD-10-CM | POA: Insufficient documentation

## 2021-01-25 DIAGNOSIS — D122 Benign neoplasm of ascending colon: Secondary | ICD-10-CM | POA: Diagnosis not present

## 2021-01-25 DIAGNOSIS — Z880 Allergy status to penicillin: Secondary | ICD-10-CM | POA: Insufficient documentation

## 2021-01-25 DIAGNOSIS — I509 Heart failure, unspecified: Secondary | ICD-10-CM | POA: Insufficient documentation

## 2021-01-25 DIAGNOSIS — K449 Diaphragmatic hernia without obstruction or gangrene: Secondary | ICD-10-CM | POA: Diagnosis not present

## 2021-01-25 DIAGNOSIS — K222 Esophageal obstruction: Secondary | ICD-10-CM

## 2021-01-25 DIAGNOSIS — Z800 Family history of malignant neoplasm of digestive organs: Secondary | ICD-10-CM | POA: Insufficient documentation

## 2021-01-25 DIAGNOSIS — K635 Polyp of colon: Secondary | ICD-10-CM | POA: Diagnosis not present

## 2021-01-25 DIAGNOSIS — D123 Benign neoplasm of transverse colon: Secondary | ICD-10-CM | POA: Insufficient documentation

## 2021-01-25 DIAGNOSIS — Z86010 Personal history of colonic polyps: Secondary | ICD-10-CM

## 2021-01-25 DIAGNOSIS — K7460 Unspecified cirrhosis of liver: Secondary | ICD-10-CM | POA: Diagnosis not present

## 2021-01-25 DIAGNOSIS — K766 Portal hypertension: Secondary | ICD-10-CM | POA: Diagnosis not present

## 2021-01-25 DIAGNOSIS — I110 Hypertensive heart disease with heart failure: Secondary | ICD-10-CM | POA: Insufficient documentation

## 2021-01-25 DIAGNOSIS — D126 Benign neoplasm of colon, unspecified: Secondary | ICD-10-CM | POA: Diagnosis not present

## 2021-01-25 DIAGNOSIS — Z809 Family history of malignant neoplasm, unspecified: Secondary | ICD-10-CM | POA: Diagnosis not present

## 2021-01-25 DIAGNOSIS — E669 Obesity, unspecified: Secondary | ICD-10-CM

## 2021-01-25 DIAGNOSIS — K3189 Other diseases of stomach and duodenum: Secondary | ICD-10-CM | POA: Diagnosis not present

## 2021-01-25 DIAGNOSIS — Z8249 Family history of ischemic heart disease and other diseases of the circulatory system: Secondary | ICD-10-CM | POA: Diagnosis not present

## 2021-01-25 HISTORY — PX: ESOPHAGOGASTRODUODENOSCOPY (EGD) WITH PROPOFOL: SHX5813

## 2021-01-25 HISTORY — PX: POLYPECTOMY: SHX5525

## 2021-01-25 HISTORY — PX: COLONOSCOPY WITH PROPOFOL: SHX5780

## 2021-01-25 LAB — GLUCOSE, CAPILLARY: Glucose-Capillary: 92 mg/dL (ref 70–99)

## 2021-01-25 SURGERY — COLONOSCOPY WITH PROPOFOL
Anesthesia: Monitor Anesthesia Care

## 2021-01-25 MED ORDER — LACTATED RINGERS IV SOLN
INTRAVENOUS | Status: AC | PRN
  Administered 2021-01-25: 10 mL/h via INTRAVENOUS

## 2021-01-25 MED ORDER — PROPOFOL 500 MG/50ML IV EMUL
INTRAVENOUS | Status: DC | PRN
  Administered 2021-01-25: 100 ug/kg/min via INTRAVENOUS

## 2021-01-25 MED ORDER — LACTATED RINGERS IV SOLN: INTRAVENOUS | Status: DC | PRN

## 2021-01-25 MED ORDER — PROPOFOL 10 MG/ML IV BOLUS
INTRAVENOUS | Status: DC | PRN
Start: 2021-01-25 — End: 2021-01-25
  Administered 2021-01-25: 30 mg via INTRAVENOUS
  Administered 2021-01-25: 20 mg via INTRAVENOUS
  Administered 2021-01-25: 30 mg via INTRAVENOUS

## 2021-01-25 MED ORDER — PROPOFOL 1000 MG/100ML IV EMUL
INTRAVENOUS | Status: AC
  Filled 2021-01-25: qty 100

## 2021-01-25 MED ORDER — SODIUM CHLORIDE 0.9 % IV SOLN: INTRAVENOUS | Status: DC

## 2021-01-25 MED ORDER — ONDANSETRON HCL 4 MG/2ML IJ SOLN
INTRAMUSCULAR | Status: DC | PRN
  Administered 2021-01-25: 4 mg via INTRAVENOUS

## 2021-01-25 SURGICAL SUPPLY — 24 items

## 2021-01-25 NOTE — Op Note (Signed)
[facility] Patient Name: Derrick Mata Procedure Date: 01/25/2021 MRN: [id number] Attending MD: Docia Chuck. [name] , MD Date of Birth: [birth year] CSN: [id number] Age: 70 Admit Type: Outpatient Procedure:                Upper GI endoscopy Indications:              Cirrhosis rule out esophageal varices Providers:                Docia Chuck. [name], MD, Jeanella Cara, RN,                            Tyrone Apple, Technician, Dellie Catholic Referring MD:             Dr. Judieth Keens Medicines:                Monitored Anesthesia Care Complications:            No immediate complications. Estimated Blood Loss:     Estimated blood loss: none. Procedure:                Pre-Anesthesia Assessment:                           - Prior to the procedure, a History and Physical                            was performed, and patient medications and                            allergies were reviewed. The patient's tolerance of                            previous anesthesia was also reviewed. The risks                            and benefits of the procedure and the sedation                            options and risks were discussed with the patient.                            All questions were answered, and informed consent                            was obtained. Prior Anticoagulants: The patient has                            taken no previous anticoagulant or antiplatelet                            agents. ASA Grade Assessment: III - A patient with                            severe systemic disease. After reviewing the risks  and benefits, the patient was deemed in                            satisfactory condition to undergo the procedure.                           After obtaining informed consent, the endoscope was                            passed under direct vision. Throughout the                            procedure, the patient's blood pressure, pulse,  and                            oxygen saturations were monitored continuously. The                            [id number] Olympus endoscope was introduced                            through the mouth, and advanced to the second part                            of duodenum. The upper GI endoscopy was                            accomplished without difficulty. The patient                            tolerated the procedure well. Scope In: Scope Out: Findings:      The esophagus revealed a large caliber distal esophageal ring. No other       abnormalities. No varices.      The stomach revealed mild portal hypertensive gastropathy with hematin       in the proximal portion. Small hiatal hernia. No varices.      The examined duodenum was normal.      The cardia and gastric fundus were normal on retroflexion. Impression:               1. Esophageal ring                           2. Hypertensive gastropathy                           3. No esophageal or gastric varices. Moderate Sedation:      none Recommendation:           - Patient has a contact number available for                            emergencies. The signs and symptoms of potential                            delayed complications were discussed with the  patient. Return to normal activities tomorrow.                            Written discharge instructions were provided to the                            patient.                           - Resume previous diet.                           - Continue present medications.                           - Repeat EGD in 3 years Procedure Code(s):        --- Professional ---                           [phone], Esophagogastroduodenoscopy, flexible,                            transoral; diagnostic, including collection of                            specimen(s) by brushing or washing, when performed                            (separate procedure) Diagnosis Code(s):         --- Professional ---                           K74.60, Unspecified cirrhosis of liver CPT copyright 2019 American Medical Association. All rights reserved. The codes documented in this report are preliminary and upon coder review may  be revised to meet current compliance requirements. Docia Chuck. [name], MD 01/25/2021 10:13:54 AM This report has been signed electronically. Number of Addenda: 0

## 2021-01-25 NOTE — Anesthesia Postprocedure Evaluation (Signed)
Anesthesia Post Note  Patient: Derrick Mata  Procedure(s) Performed: COLONOSCOPY WITH PROPOFOL ESOPHAGOGASTRODUODENOSCOPY (EGD) WITH PROPOFOL POLYPECTOMY     Patient location during evaluation: PACU Anesthesia Type: MAC Level of consciousness: awake and alert and oriented Pain management: pain level controlled Vital Signs Assessment: post-procedure vital signs reviewed and stable Respiratory status: spontaneous breathing, nonlabored ventilation and respiratory function stable Cardiovascular status: stable and blood pressure returned to baseline Postop Assessment: no apparent nausea or vomiting Anesthetic complications: no   No notable events documented.  Last Vitals:  Vitals:   01/25/21 0810 01/25/21 1021  BP: (!) 167/70 131/64  Pulse: 75   Resp: (!) 23 (!) 22  Temp: 36.5 C 36.5 C  SpO2: 97% 98%    Last Pain:  Vitals:   01/25/21 1021  TempSrc: Oral                 Saida Lonon A.

## 2021-01-25 NOTE — H&P (Signed)
HISTORY OF PRESENT ILLNESS:  Derrick Mata is a 70 y.o. male who presents today for surveillance colonoscopy due to a history of multiple adenomatous colon polyps and surveillance upper endoscopy due to history of cirrhosis and screening for varices.  He was fully evaluated in the office December 05, 2020.  There have been no interval changes in his clinical history or physical exam (see below).  He tolerated his prep  REVIEW OF SYSTEMS:  All non-GI ROS negative.    Past Medical History:  Diagnosis Date   Arthritis    OA of knees   CHF (congestive heart failure) (HCC)    Closed compression fracture of L1 lumbar vertebra 09/03/2016   Colon polyp    Complication of anesthesia    slow to wake up   COPD (chronic obstructive pulmonary disease) (HCC)    > 30 pack yr smoker   Depression    Anxiety and Insomnia   Diverticulosis    Erectile dysfunction    GERD (gastroesophageal reflux disease)    Hemorrhoids    Hep C w/o coma, chronic ([place]) 06/12/2015   treated with medications   Leg edema    Morbid obesity ([place])    Sleep apnea    refuses CPAP   Substance abuse ([place])    hx alcohol and cocaine use, 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, 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    Past Surgical History:  Procedure Laterality Date   COLONOSCOPY  04/18/2011   Procedure: COLONOSCOPY;  Surgeon: Scarlette Shorts, MD;  [facility];  Service: Endoscopy;  Laterality: N/A;   COLONOSCOPY WITH PROPOFOL N/A 05/07/2016   Procedure: COLONOSCOPY WITH PROPOFOL;  Surgeon: Irene Shipper, MD;  [facility];  Service: Endoscopy;  Laterality: N/A;   COLONOSCOPY WITH PROPOFOL N/A 08/12/2017   Procedure: COLONOSCOPY WITH PROPOFOL;  Surgeon: Irene Shipper, MD;  [facility];  Service: Endoscopy;  Laterality: N/A;   ESOPHAGOGASTRODUODENOSCOPY (EGD) WITH PROPOFOL N/A 05/07/2016   Procedure: ESOPHAGOGASTRODUODENOSCOPY (EGD) WITH PROPOFOL;  Surgeon: Irene Shipper, MD;  [facility];  Service:  Endoscopy;  Laterality: N/A;   KNEE ARTHROSCOPY  2011   left   LUMBAR LAMINECTOMY  1994, 2010   [place]  2006   hardware   TONSILLECTOMY  1963    Social History Derrick Mata  reports that he has been smoking cigarettes. He started smoking about 57 years ago. He has a 51.00 pack-year smoking history. He has never used smokeless tobacco. He reports that he does not drink alcohol and does not use drugs.  family history includes Breast cancer in his mother; Cancer (age of onset: 41) in his sister; Colon cancer in his sister; Heart disease in an other family member; Heart disease (age of onset: 80) in his father.  Allergies  Allergen Reactions   Penicillins Anaphylaxis, Nausea Only and Other (See Comments)    Passed out. Has patient had a PCN reaction causing immediate rash, facial/tongue/throat swelling, SOB or lightheadedness with hypotension: {no Has patient had a PCN reaction causing severe rash involving mucus membranes or skin necrosis: {no Has patient had a PCN reaction that required hospitalization {no Has patient had a PCN reaction occurring within the last 10 years: {no If all of the above answers are "NO", then may proceed with Cephalosporin use.   Sulfamethoxazole Hives       PHYSICAL EXAMINATION:  Vital signs: BP (!) 167/70   Pulse 75   Temp 97.7 F (36.5 C) (  Oral)   Resp (!) 23   Ht 6' (1.829 m)   Wt (!) 191 kg   SpO2 97%   BMI 57.10 kg/m  General: Well-developed, well-nourished, no acute distress HEENT: Sclerae are anicteric, conjunctiva pink. Oral mucosa intact Lungs: Clear Heart: Regular Abdomen: soft, nontender, nondistended, no obvious ascites, no peritoneal signs, normal bowel sounds. No organomegaly. Extremities: No edema Psychiatric: alert and oriented x3. Cooperative     ASSESSMENT:  1.  Multiple adenomatous colon polyps due for surveillance 2.  Cirrhosis.  For her screening upper endoscopy 3.  Multiple significant medical  problems including morbid obesity   PLAN:  1.  Colonoscopy and upper endoscopy

## 2021-01-25 NOTE — Transfer of Care (Signed)
Immediate Anesthesia Transfer of Care Note  Patient: Derrick Mata  Procedure(s) Performed: COLONOSCOPY WITH PROPOFOL ESOPHAGOGASTRODUODENOSCOPY (EGD) WITH PROPOFOL POLYPECTOMY  Patient Location: Endoscopy Unit  Anesthesia Type:MAC  Level of Consciousness: awake, alert , oriented and patient cooperative  Airway & Oxygen Therapy: Patient Spontanous Breathing and Patient connected to face mask  Post-op Assessment: Report given to RN and Post -op Vital signs reviewed and stable  Post vital signs: Reviewed and stable  Last Vitals:  Vitals Value Taken Time  BP    Temp    Pulse    Resp    SpO2      Last Pain:  Vitals:   01/25/21 0810  TempSrc: Oral         Complications: No notable events documented.

## 2021-01-25 NOTE — Op Note (Signed)
[facility] Patient Name: Derrick Mata Procedure Date: 01/25/2021 MRN: [id number] Attending MD: Docia Chuck. Derrick Mata , MD Date of Birth: 11-04-1950 CSN: [id number] Age: 70 Admit Type: Outpatient Procedure:                Colonoscopy with cold snare polypectomy x 5 Indications:              High risk colon cancer surveillance: Personal                            history of multiple (3 or more) adenomas. Previous                            examinations 2002, 2003, 2007, 2012, 2017, 2019 Providers:                Docia Chuck. Derrick Pastor, MD, Jeanella Cara, RN,                            Tyrone Apple, Technician, Dellie Catholic Referring MD:             Dr. Judieth Keens Medicines:                Monitored Anesthesia Care Complications:            No immediate complications. Estimated blood loss:                            None. Estimated Blood Loss:     Estimated blood loss: none. Procedure:                Pre-Anesthesia Assessment:                           - Prior to the procedure, a History and Physical                            was performed, and patient medications and                            allergies were reviewed. The patient's tolerance of                            previous anesthesia was also reviewed. The risks                            and benefits of the procedure and the sedation                            options and risks were discussed with the patient.                            All questions were answered, and informed consent                            was obtained. Prior Anticoagulants: The patient has  taken no previous anticoagulant or antiplatelet                            agents. ASA Grade Assessment: III - A patient with                            severe systemic disease. After reviewing the risks                            and benefits, the patient was deemed in                            satisfactory condition to  undergo the procedure.                           After obtaining informed consent, the colonoscope                            was passed under direct vision. Throughout the                            procedure, the patient's blood pressure, pulse, and                            oxygen saturations were monitored continuously. The                            [id number]) Olympus colonoscope was                            introduced through the anus and advanced to the the                            cecum, identified by appendiceal orifice and                            ileocecal valve. The ileocecal valve, appendiceal                            orifice, and rectum were photographed. The quality                            of the bowel preparation was excellent. The                            colonoscopy was performed without difficulty. The                            patient tolerated the procedure well. The bowel                            preparation used was SUPREP via split dose  instruction. Scope In: 9:33:09 AM Scope Out: 9:51:51 AM Scope Withdrawal Time: 0 hours 14 minutes 57 seconds  Total Procedure Duration: 0 hours 18 minutes 42 seconds  Findings:      Five polyps were found in the sigmoid colon, transverse colon and       ascending colon. The polyps were 2 to 3 mm in size. These polyps were       removed with a cold snare. Resection and retrieval were complete.      Multiple diverticula were found in the left colon and right colon.      The exam was otherwise without abnormality on direct and retroflexion       views. Impression:               - Five 2 to 3 mm polyps in the sigmoid colon, in                            the transverse colon and in the ascending colon,                            removed with a cold snare. Resected and retrieved.                           - Diverticulosis in the left colon and in the right                             colon.                           - The examination was otherwise normal on direct                            and retroflexion views. Moderate Sedation:      none Recommendation:           - Repeat colonoscopy in 3 - 5 years for                            surveillance.                           - Patient has a contact number available for                            emergencies. The signs and symptoms of potential                            delayed complications were discussed with the                            patient. Return to normal activities tomorrow.                            Written discharge instructions were provided to the                            patient.                           -  Resume previous diet.                           - Continue present medications.                           - Await pathology results. Procedure Code(s):        --- Professional ---                           [phone], Colonoscopy, flexible; with removal of                            tumor(s), polyp(s), or other lesion(s) by snare                            technique Diagnosis Code(s):        --- Professional ---                           Z86.010, Personal history of colonic polyps                           K63.5, Polyp of colon                           K57.30, Diverticulosis of large intestine without                            perforation or abscess without bleeding CPT copyright 2019 [employer]. All rights reserved. The codes documented in this report are preliminary and upon coder review may  be revised to meet current compliance requirements. Docia Chuck. Derrick Pastor, MD 01/25/2021 9:57:29 AM This report has been signed electronically. Number of Addenda: 0

## 2021-01-25 NOTE — Anesthesia Preprocedure Evaluation (Addendum)
Anesthesia Evaluation  Patient identified by MRN, date of birth, ID band Patient awake    Reviewed: Allergy & Precautions, NPO status , Patient's Chart, lab work & pertinent test results, reviewed documented beta blocker date and time   History of Anesthesia Complications (+) PROLONGED EMERGENCE and history of anesthetic complications  Airway Mallampati: II  TM Distance: >3 FB Neck ROM: Full    Dental  (+) Teeth Intact, Dental Advisory Given, Caps,    Pulmonary sleep apnea , COPD,  COPD inhaler, Current Smoker and Patient abstained from smoking.,    breath sounds clear to auscultation       Cardiovascular hypertension, Pt. on medications and Pt. on home beta blockers +CHF   Rhythm:Regular Rate:Normal     Neuro/Psych  Headaches, PSYCHIATRIC DISORDERS Depression    GI/Hepatic GERD  Medicated and Controlled,(+) Cirrhosis     substance abuse  alcohol use and cocaine use, Hepatitis -, CHx/o chronic HEP C Hx/o colon polyps Hx/o diverticulosis   Endo/Other  Morbid obesity  Renal/GU   negative genitourinary   Musculoskeletal  (+) Arthritis , Osteoarthritis,    Abdominal (+) + obese,   Peds  Hematology   Anesthesia Other Findings   Reproductive/Obstetrics ED                            Anesthesia Physical  Anesthesia Plan  ASA: 3  Anesthesia Plan: MAC   Post-op Pain Management:    Induction: Intravenous  PONV Risk Score and Plan: 1 and Ondansetron, Treatment may vary due to age or medical condition and Propofol infusion  Airway Management Planned: Natural Airway and Simple Face Mask  Additional Equipment:   Intra-op Plan:   Post-operative Plan:   Informed Consent: I have reviewed the patients History and Physical, chart, labs and discussed the procedure including the risks, benefits and alternatives for the proposed anesthesia with the patient or authorized representative who has  indicated his/her understanding and acceptance.     Dental advisory given  Plan Discussed with: CRNA and Anesthesiologist  Anesthesia Plan Comments:         Anesthesia Quick Evaluation

## 2021-01-25 NOTE — Discharge Instructions (Signed)
YOU HAD AN ENDOSCOPIC PROCEDURE TODAY: Refer to the procedure report and other information in the discharge instructions given to you for any specific questions about what was found during the examination. If this information does not answer your questions, please call [place] office at [phone] to clarify.  ° °YOU SHOULD EXPECT: Some feelings of bloating in the abdomen. Passage of more gas than usual. Walking can help get rid of the air that was put into your GI tract during the procedure and reduce the bloating. If you had a lower endoscopy (such as a colonoscopy or flexible sigmoidoscopy) you may notice spotting of blood in your stool or on the toilet paper. Some abdominal soreness may be present for a day or two, also. ° °DIET: Your first meal following the procedure should be a light meal and then it is ok to progress to your normal diet. A half-sandwich or bowl of soup is an example of a good first meal. Heavy or fried foods are harder to digest and may make you feel nauseous or bloated. Drink plenty of fluids but you should avoid alcoholic beverages for 24 hours. If you had a esophageal dilation, please see attached instructions for diet.   ° °ACTIVITY: Your care partner should take you home directly after the procedure. You should plan to take it easy, moving slowly for the rest of the day. You can resume normal activity the day after the procedure however YOU SHOULD NOT DRIVE, use power tools, machinery or perform tasks that involve climbing or major physical exertion for 24 hours (because of the sedation medicines used during the test).  ° °SYMPTOMS TO REPORT IMMEDIATELY: °A gastroenterologist can be reached at any hour. Please call [phone]  for any of the following symptoms:  °Following lower endoscopy (colonoscopy, flexible sigmoidoscopy) °Excessive amounts of blood in the stool  °Significant tenderness, worsening of abdominal pains  °Swelling of the abdomen that is new, acute  °Fever of 100° or  higher  °Following upper endoscopy (EGD, EUS, ERCP, esophageal dilation) °Vomiting of blood or coffee ground material  °New, significant abdominal pain  °New, significant chest pain or pain under the shoulder blades  °Painful or persistently difficult swallowing  °New shortness of breath  °Black, tarry-looking or red, bloody stools ° °FOLLOW UP:  °If any biopsies were taken you will be contacted by phone or by letter within the next 1-3 weeks. Call [phone]  if you have not heard about the biopsies in 3 weeks.  °Please also call with any specific questions about appointments or follow up tests. ° °

## 2021-01-25 NOTE — Anesthesia Procedure Notes (Signed)
Procedure Name: MAC Date/Time: 01/25/2021 9:25 AM Performed by: Claudia Desanctis, CRNA Pre-anesthesia Checklist: Patient identified, Emergency Drugs available, Suction available and Patient being monitored Patient Re-evaluated:Patient Re-evaluated prior to induction Oxygen Delivery Method: Simple face mask

## 2021-01-26 ENCOUNTER — Encounter (HOSPITAL_COMMUNITY): Payer: Self-pay | Admitting: Internal Medicine

## 2021-01-26 LAB — SURGICAL PATHOLOGY

## 2021-01-29 ENCOUNTER — Ambulatory Visit (INDEPENDENT_AMBULATORY_CARE_PROVIDER_SITE_OTHER): Payer: Medicare Other | Admitting: Pharmacist

## 2021-01-29 DIAGNOSIS — I10 Essential (primary) hypertension: Secondary | ICD-10-CM

## 2021-01-29 DIAGNOSIS — I5040 Unspecified combined systolic (congestive) and diastolic (congestive) heart failure: Secondary | ICD-10-CM

## 2021-01-29 NOTE — Progress Notes (Signed)
Chronic Care Management Pharmacy Note  02/15/2021 Name:  Derrick Mata MRN:  [id number] DOB:  1950/08/06  Summary: BP at goal < 130/80 LDL not at goal < 70  Recommendations/Changes made from today's visit: -Recommended moving Entresto to evening time with other medications to simplify regimen -Recommended purchasing a BP cuff and monitoring at home -Recommend repeat lipid panel in 2-3 months -Requested audiology referral  Plan: Provide PAP for Entresto Follow up BP assessment in 1 month  Subjective: Derrick Mata is an 70 y.o. year old male who is a primary patient of Isaac Bliss, Rayford Halsted, MD.  The CCM team was consulted for assistance with disease management and care coordination needs.    Engaged with patient by telephone for initial visit in response to provider referral for pharmacy case management and/or care coordination services.   Consent to Services:  The patient was given the following information about Chronic Care Management services today, agreed to services, and gave verbal consent: 1. CCM service includes personalized support from designated clinical staff supervised by the primary care provider, including individualized plan of care and coordination with other care providers 2. 24/7 contact phone numbers for assistance for urgent and routine care needs. 3. Service will only be billed when office clinical staff spend 20 minutes or more in a month to coordinate care. 4. Only one practitioner may furnish and bill the service in a calendar month. 5.The patient may stop CCM services at any time (effective at the end of the month) by phone call to the office staff. 6. The patient will be responsible for cost sharing (co-pay) of up to 20% of the service fee (after annual deductible is met). Patient agreed to services and consent obtained.  Patient Care Team: Isaac Bliss, Rayford Halsted, MD as PCP - General (Internal Medicine) Stanford Breed Denice Bors, MD as PCP -  Cardiology (Cardiology) Comer, Okey Regal, MD as Consulting Physician (Infectious Diseases) Monna Fam, MD as Consulting Physician (Ophthalmology) Viona Gilmore, [facility] as Pharmacist (Pharmacist)  Recent office visits: 10/27/20 Domingo Mend, MD: Patient presented for annual exam.   Recent consult visits: 12/05/20 Scarlette Shorts, MD (gastro): Patient presented for history of colonic polyps. Plan for colonoscopy at the hospital due to high risk.  Hospital visits: 01/25/21 Patient admitted to [facility] for colonoscopy and upper endoscopy.  Objective:  Lab Results  Component Value Date   CREATININE 0.88 10/27/2020   BUN 11 10/27/2020   GFR 86.33 10/27/2020   GFRNONAA 78 07/04/2020   GFRAA 90 07/04/2020   NA 141 10/27/2020   K 4.9 10/27/2020   CALCIUM 9.1 10/27/2020   CO2 28 10/27/2020   GLUCOSE 85 10/27/2020    Lab Results  Component Value Date/Time   HGBA1C 5.9 10/27/2020 11:05 AM   HGBA1C 5.8 (A) 07/04/2020 09:25 AM   HGBA1C 6.0 12/08/2018 12:17 PM   GFR 86.33 10/27/2020 11:05 AM   GFR 100.97 12/08/2018 12:17 PM    Last diabetic Eye exam:  Lab Results  Component Value Date/Time   HMDIABEYEEXA No Retinopathy 03/25/2016 12:00 AM    Last diabetic Foot exam: No results found for: HMDIABFOOTEX   Lab Results  Component Value Date   CHOL 170 10/27/2020   HDL 37.30 (L) 10/27/2020   LDLCALC 118 (H) 10/27/2020   TRIG 73.0 10/27/2020   CHOLHDL 5 10/27/2020    Hepatic Function Latest Ref Rng & Units 10/27/2020 12/08/2018 10/03/2016  Total Protein 6.0 - 8.3 g/dL 7.1 6.7 7.0  Albumin 3.5 - 5.2 g/dL 3.7 3.6 3.6  AST 0 - 37 U/L _0 ALT 0 - 53 U/L 7 6 6(L)  Alk Phosphatase 39 - 117 U/L 54 55 52  Total Bilirubin 0.2 - 1.2 mg/dL 0.5 0.4 0.4  Bilirubin, Direct 0.0 - 0.3 mg/dL - - -    Lab Results  Component Value Date/Time   TSH 2.06 10/27/2020 11:05 AM   TSH 1.98 12/08/2018 12:17 PM   FREET4 1.12 03/29/2011 06:15 AM    CBC Latest Ref Rng &  Units 10/27/2020 12/08/2018 02/19/2018  WBC 4.0 - 10.5 K/uL 8.3 8.2 8.4  Hemoglobin 13.0 - 17.0 g/dL 14.2 13.9 14.1  Hematocrit 39.0 - 52.0 % 42.6 42.9 43.9  Platelets 150.0 - 400.0 K/uL 277.0 269.0 243.0    Lab Results  Component Value Date/Time   VD25OH 34.48 10/27/2020 11:05 AM   VD25OH 34.00 12/08/2018 12:17 PM    Clinical ASCVD: Yes  The 10-year ASCVD risk score (Arnett DK, et al., 2019) is: 34.7%   Values used to calculate the score:     Age: 51 years     Sex: Male     Is Non-Hispanic African American: Yes     Diabetic: No     Tobacco smoker: Yes     Systolic Blood Pressure: 130 mmHg     Is BP treated: Yes     HDL Cholesterol: 37.3 mg/dL     Total Cholesterol: 170 mg/dL    Depression screen [facility] Helmuth 2/9 10/27/2020 07/04/2020 12/08/2018  Decreased Interest 0 0 0  Down, Depressed, Hopeless 0 0 0  PHQ - 2 Score 0 0 0  Altered sleeping 0 1 1  Tired, decreased energy 1 0 0  Change in appetite 0 0 0  Feeling bad or failure about yourself  0 0 0  Trouble concentrating 0 0 0  Moving slowly or fidgety/restless 0 0 0  Suicidal thoughts 0 0 0  PHQ-9 Score _1 Difficult doing work/chores - Not difficult at all Not difficult at all  Some recent data might be hidden      Social History   Tobacco Use  Smoking Status Every Day   Packs/day: 1.00   Years: 51.00   Pack years: 51.00   Types: Cigarettes   Start date: 05/21/1963  Smokeless Tobacco Never  Tobacco Comments   not ready to quit yet   BP Readings from Last 3 Encounters:  01/25/21 131/64  12/05/20 114/72  10/27/20 130/80   Pulse Readings from Last 3 Encounters:  01/25/21 75  12/05/20 72  10/27/20 63   Wt Readings from Last 3 Encounters:  01/25/21 (!) 421 lb (191 kg)  12/05/20 (!) 414 lb 4 oz (187.9 kg)  10/27/20 (!) 421 lb 11.2 oz (191.3 kg)   BMI Readings from Last 3 Encounters:  01/25/21 57.10 kg/m  12/05/20 54.65 kg/m  10/27/20 55.64 kg/m    Assessment/Interventions: Review of patient past medical  history, allergies, medications, health status, including review of consultants reports, laboratory and other test data, was performed as part of comprehensive evaluation and provision of chronic care management services.   SDOH:  (Social Determinants of Health) assessments and interventions performed: Yes SDOH Interventions    Flowsheet Row Most Recent Value  SDOH Interventions   Financial Strain Interventions Other (Comment)  [working on PAP for Entresto]  Transportation Interventions Intervention Not Indicated      SDOH Screenings   Alcohol Screen: Not on file  Depression (  PHQ2-9): Low Risk    PHQ-2 Score: 1  Financial Resource Strain: Low Risk    Difficulty of Paying Living Expenses: Not very hard  Food Insecurity: Not on file  Housing: Not on file  Physical Activity: Not on file  Social Connections: Not on file  Stress: Not on file  Tobacco Use: High Risk   Smoking Tobacco Use: Every Day   Smokeless Tobacco Use: Never  Transportation Needs: No Transportation Needs   Lack of Transportation (Medical): No   Lack of Transportation (Non-Medical): No   Patient wakes up around 6 am and puts on his coffee pot for him and his wife and then he eats between 8-10am and tries to take his medications before eating. He takes Entresto, furosemide, multivitamin, Wellbutrin, omeprazole, and spironolactone in the morning.  Patient is having pain with his knees and just started back with getting his shots for his knees and spends most of the day in his recliner. Patient can walk to his car but that's about it. Patient has never had a hearing test and inquired about getting one done. Patient's wife complains that his hearing is worse.  Patient takes 1030-1200 metoprolol, trazodone, and aspirin at night. Patient takes Entresto at dinner around 5-6 pm.  Patient reports he doesn't eat very much. For breakfast, he usually eats 2 pancakes, 2 eggs, 2 pieces of bacon. For lunch, he usually he eats 2  sandwiches, but today he had 2 cups of coffee, 2 hot dogs, little mustard and ketchup, and 1 piece of bread with each. He reports sometimes dinner is the same or he eats 2 hamburgers; lettuce, tomato & mayo and a bun and potatoes. CCM Care Plan  Allergies  Allergen Reactions   Penicillins Anaphylaxis, Nausea Only and Other (See Comments)    Passed out. Has patient had a PCN reaction causing immediate rash, facial/tongue/throat swelling, SOB or lightheadedness with hypotension: {no Has patient had a PCN reaction causing severe rash involving mucus membranes or skin necrosis: {no Has patient had a PCN reaction that required hospitalization {no Has patient had a PCN reaction occurring within the last 10 years: {no If all of the above answers are "NO", then may proceed with Cephalosporin use.   Sulfamethoxazole Hives    Medications Reviewed Today     Reviewed by Viona Gilmore, [facility] (Pharmacist) on 01/29/21 at [place] List Status: <None>   Medication Order Taking? Sig Documenting Provider Last Dose Status Informant  aspirin EC 81 MG tablet [id number] Yes Take 81 mg by mouth at bedtime. [provider] Taking Active Self  buPROPion (WELLBUTRIN XL) 150 MG 24 hr tablet [id number] Yes TAKE 1 TABLET(150 MG) BY MOUTH DAILY Isaac Bliss, Rayford Halsted, MD Taking Active Self  diclofenac sodium (VOLTAREN) 1 % GEL [id number] No Apply 2 g topically 2 (two) times daily.  Patient not taking: No sig reported   Lucretia Kern, DO Not Taking Active   furosemide (LASIX) 40 MG tablet [id number] Yes TAKE 1 TABLET(40 MG) BY MOUTH DAILY Isaac Bliss, Rayford Halsted, MD Taking Active Self  metoprolol succinate (TOPROL-XL) 25 MG 24 hr tablet [id number] Yes TAKE 1 TABLET(25 MG) BY MOUTH DAILY  Patient taking differently: Take 25 mg by mouth every evening.   Lelon Perla, MD Taking Active   Multiple Vitamin (MULTIVITAMIN WITH MINERALS) TABS tablet [id number] Yes Take 1 tablet by mouth daily. [provider] Taking Active Self  omeprazole (PRILOSEC) 20 MG capsule [id number] Yes Take 1 capsule (20 mg  total) by mouth daily. Isaac Bliss, Rayford Halsted, MD Taking Active Self  sacubitril-valsartan [facility]) 24-26 [place] [id number] Yes Take 1 tablet by mouth 2 (two) times daily. Lelon Perla, MD Taking Active Self  spironolactone (ALDACTONE) 25 MG tablet [id number] Yes Take 1 tablet (25 mg total) by mouth daily. Lelon Perla, MD Taking Active Self  traZODone (DESYREL) 150 MG tablet [id number] Yes TAKE 1 TABLET(150 MG) BY MOUTH AT BEDTIME Isaac Bliss, Rayford Halsted, MD Taking Active Self            Patient Active Problem List   Diagnosis Date Noted   Benign neoplasm of ascending colon    Portal hypertension ([place])    Portal hypertensive gastropathy ([place])    Esophageal ring    IGT (impaired glucose tolerance) 12/09/2018   History of colonic polyps    Recurrent major depressive disorder, in full remission ([place]) 07/07/2017   Aortic atherosclerosis ([place]) 07/02/2017   Frequent headaches 01/03/2017   Benign neoplasm of descending colon    Benign neoplasm of rectum    Hyperglycemia 02/01/2016   Hepatic cirrhosis ([place]) 12/19/2015   Super obesity 07/18/2015   Obesity hypoventilation syndrome ([place]) 07/18/2015   CHF (congestive heart failure), NYHA class I ([place]) 07/18/2015   H/O: substance abuse ([place]) 07/18/2015   OSA and COPD overlap syndrome ([place]) 07/18/2015   Noncompliance with CPAP treatment 07/18/2015   Moderate smoker (20 or less per day) 06/19/2015   Hep C w/o coma, chronic ([place]) 06/12/2015   ERECTILE DYSFUNCTION 01/31/2010   KNEE PAIN, LEFT, CHRONIC 05/18/2009   TOBACCO USE 06/23/2007   Essential hypertension 11/24/2006   Osteoarthritis 11/24/2006   OSA (obstructive sleep apnea) 11/24/2006    Immunization History  Administered Date(s) Administered   Hepatitis A, Adult 12/19/2015, 10/03/2016   Hepatitis B, adult 08/21/2015, 04/04/2016   Hepatitis B, ped/adol  07/19/2015   Influenza Split 03/28/2011   Influenza Whole 06/23/2007, 02/15/2009, 01/31/2010   Influenza, High Dose Seasonal PF 02/01/2016, 03/10/2017, 02/19/2018, 02/20/2019   Influenza,inj,Quad PF,6+ Mos 01/12/2013, 02/25/2014   Influenza-Unspecified 01/19/2015, 02/17/2017, 05/03/2020   PFIZER Comirnaty([place])Covid-19 Tri-Sucrose Vaccine 06/21/2019, 07/19/2019, 04/03/2020   Pneumococcal Conjugate-13 09/08/2014   Pneumococcal Polysaccharide-23 07/17/2015   Zoster Recombinat (Shingrix) 04/02/2017, 07/29/2017   Zoster, Live 02/08/2012    Conditions to be addressed/monitored:  Hypertension, Hyperlipidemia, Heart Failure, GERD, COPD, Depression, Tobacco use, and Hep C, Prediabetes  Care Plan : [place]  Updates made by Viona Gilmore, [place] since 02/15/2021 12:00 AM     Problem: Problem: Hypertension, Hyperlipidemia, Heart Failure, GERD, COPD, Depression, Tobacco use, and Hep C, Prediabetes      Long-Range Goal: Patient-Specific Goal   Start Date: 01/29/2021  Expected End Date: 01/29/2022  This Visit's Progress: On track  Priority: High  Note:   Current Barriers:  Unable to independently monitor therapeutic efficacy Unable to achieve control of cholesterol   Pharmacist Clinical Goal(s):  Patient will achieve adherence to monitoring guidelines and medication adherence to achieve therapeutic efficacy achieve control of cholesterol as evidenced by next lipid panel  through collaboration with PharmD and provider.   Interventions: 1:1 collaboration with Isaac Bliss, Rayford Halsted, MD regarding development and update of comprehensive plan of care as evidenced by provider attestation and co-signature Inter-disciplinary care team collaboration (see longitudinal plan of care) Comprehensive medication review performed; medication list updated in electronic medical record  Hypertension (BP goal <130/80) -Controlled (per office readings) -Current treatment: Entresto 24-26  mg 1 tablet twice daily Spironolactone 25 mg 1  tablet daily Metoprolol succinate 25 mg 1 tablet in the evening Furosemide 40 mg 1 tablet daily -Medications previously tried: n/a  -Current home readings: 130s-140s/70-80s  -Current dietary habits: eats at home mostly; eats more red meat; needs to bake fish and fried; doesn't add salt except for tomato sandwich; sardines sometimes; does eat fast food some (once or twice a month) -Current exercise habits: limited -Denies hypotensive/hypertensive symptoms -Educated on BP goals and benefits of medications for prevention of heart attack, stroke and kidney damage; Importance of home blood pressure monitoring; Proper BP monitoring technique; Symptoms of hypotension and importance of maintaining adequate hydration; -Counseled to monitor BP at home weekly, document, and provide log at future appointments -Counseled on diet and exercise extensively Recommended to continue current medication Recommended purchasing Omron arm cuff.  Hyperlipidemia: (LDL goal < 70) -Uncontrolled -Current treatment: Rosuvastatin 40 mg 1 tablet daily - not started -Medications previously tried: none  -Current dietary patterns: does eat out some and fries food -Current exercise habits: limited -Educated on Cholesterol goals;  Benefits of statin for ASCVD risk reduction; Importance of limiting foods high in cholesterol; Exercise goal of 150 minutes per week; -Counseled on diet and exercise extensively Recommended to continue current medication Recommended repeat lipid panel in 2-3 months.  Heart Failure (Goal: manage symptoms and prevent exacerbations) -Controlled -Last ejection fraction: 35-40% (Date: 04/08/2019) -HF type: Combined Systolic and Diastolic -NYHA Class: I (no actitivty limitation) -AHA HF Stage: B (Heart disease present - no symptoms present) -Current treatment: Entresto 25-46 mg 1 tablet twice daily Spironolactone 25 mg 1 tablet daily Metoprolol  succinate 25 mg 1 tablet in the evening Furosemide 40 mg 1 tablet daily -Medications previously tried: none  -Current home BP/HR readings: does not check -Current dietary habits: limits salt intake some -Current exercise habits: limited -Educated on Benefits of medications for managing symptoms and prolonging life Importance of weighing daily; if you gain more than 3 pounds in one day or 5 pounds in one week, call cardiologist. Importance of blood pressure control -Counseled on diet and exercise extensively Recommended to continue current medication  Depression(Goal: minimize symptoms) -Controlled -Current treatment: Bupropion XL 150 mg 1 tablet daily -Medications previously tried/failed: n/a -PHQ9: 1 -Educated on Benefits of medication for symptom control Benefits of cognitive-behavioral therapy with or without medication -Recommended to continue current medication  Insomnia (Goal: improve quality and quantity of sleep) -Uncontrolled -Current treatment  Trazodone 150 mg 1 tablet at bedtime -Medications previously tried: none  -Counseled on practicing good sleep hygiene by setting a sleep schedule and maintaining it, avoid excessive napping, following a nightly routine, avoiding screen time for 30-60 minutes before going to bed, and making the bedroom a cool, quiet and dark space  Tobacco use (Goal quit smoking) -Uncontrolled -Previous quit attempts: Chantix -Current treatment  No medications -Patient smokes Within 30 minutes of waking -Patient triggers include: watching television and talking -On a scale of 1-10, reports MOTIVATION to quit is 3 -On a scale of 1-10, reports CONFIDENCE in quitting is 4 -Provided contact information for [place] Quit Line (1-800-QUIT-NOW) and encouraged patient to reach out to this group for support. -Educated on benefits of smoking cessation.  GERD (Goal: minimize symptoms) -Controlled -Current treatment  Omeprazole 20 mg 1 capsule  daily -Medications previously tried: none  - Patient will inquire about the need to continue this medication.  Health Maintenance -Vaccine gaps: tetanus, COVID booster, influenza vaccine -Current therapy:  Multivitamin 1 tablet daily Aspirin 81 mg 1 tablet daily -Educated on  Cost vs benefit of each product must be carefully weighed by individual consumer -Patient is satisfied with current therapy and denies issues -Recommended trying Voltaren gel again for pain as patient found this helpful previously.  Patient Goals/Self-Care Activities Patient will:  - take medications as prescribed check blood pressure weekly, document, and provide at future appointments target a minimum of 150 minutes of moderate intensity exercise weekly  Follow Up Plan: Telephone follow up appointment with care management team member scheduled for: 4 months       Medication Assistance: None required.  Patient affirms current coverage meets needs.  Compliance/Adherence/Medication fill history: Care Gaps: Influenza, COVID booster  Star-Rating Drugs: None  Patient's preferred pharmacy is:  [facility], [place] [address] Phone: [phone] Fax: [place], [place] - [place] [facility] [place] [address] Phone: [phone] Fax: [place], [place] [address] Phone: [phone] Fax: [phone]  Uses pill box? Yes - 2 weeks at a time Pt endorses 99% compliance  We discussed: Current pharmacy is preferred with insurance plan and patient is satisfied with pharmacy services Patient decided to: Continue current medication management strategy  Care Plan and Follow Up Patient Decision:  Patient agrees to Care Plan and Follow-up.  Plan: Telephone follow up appointment with care  management team member scheduled for:  4 months  [name], PharmD, [place] Pharmacist [place] at [place] [phone]

## 2021-01-30 ENCOUNTER — Telehealth: Payer: Self-pay | Admitting: Internal Medicine

## 2021-01-30 ENCOUNTER — Telehealth: Payer: Self-pay | Admitting: Cardiology

## 2021-01-30 DIAGNOSIS — Z79899 Other long term (current) drug therapy: Secondary | ICD-10-CM

## 2021-01-30 MED ORDER — ROSUVASTATIN CALCIUM 40 MG PO TABS: 40.0000 mg | ORAL_TABLET | Freq: Every day | ORAL | 3 refills | Status: DC

## 2021-01-30 NOTE — Telephone Encounter (Signed)
Left message to call back  

## 2021-01-30 NOTE — Telephone Encounter (Signed)
Patient called requesting path results

## 2021-01-30 NOTE — Telephone Encounter (Signed)
Patient called asking if he still needs to take Omeprazole medication.

## 2021-01-30 NOTE — Telephone Encounter (Signed)
Pt updated with MD's recommendations and verbalized understanding. New orders placed   Lelon Perla, MD  You 44 minutes ago (4:25 PM)   [facility] for cuff; coronary calcification noted; add crestor 40 mg daily; lipids and liver 12 weeks  Kirk Ruths

## 2021-01-30 NOTE — Telephone Encounter (Signed)
Pt state he recently had a Chest CT screening and was instructed to to contact Dr. Stanford Breed regarding cardiac findings. Results in epic.   Pt also questioning if MD would write a prescription for a large BP cuff. He state due to his arm size, he is unable to use his wife machine.   Will forward to MD

## 2021-01-30 NOTE — Telephone Encounter (Signed)
See path report

## 2021-01-30 NOTE — Telephone Encounter (Signed)
Patient states he had a lung screening and was told there was something wrong with one of his veins and he needed to contact Dr. Stanford Breed.

## 2021-02-02 NOTE — Telephone Encounter (Signed)
Spoke with patient and told him that per Dr. Blanch Media most recent note he should continue taking Omeprazole.  Patient agreed.

## 2021-02-05 NOTE — Telephone Encounter (Signed)
   Pt id calling back to f/u the large BP cuff he requested. He said he went to his [facility] and they don't carry the large BP cuff and would like to ask th nurse where he can get one

## 2021-02-05 NOTE — Telephone Encounter (Signed)
Called patient, advised of a medical supply store, and to try these.  Patient will call insurance to see if they will cover him to get BP cuff.

## 2021-02-15 NOTE — Patient Instructions (Addendum)
Hi Derrick Mata,  It was great to get to meet you over the telephone! Below is a summary of some of the topics we discussed.   Please reach out to me if you have any questions or need anything before our follow up!  Best, Maddie  [name], PharmD, [facility]   Visit Information   Goals Addressed             This Visit's Progress    Track and Manage My Blood Pressure-Hypertension       Timeframe:  Short-Term Goal Priority:  High Start Date:                             Expected End Date:                       Follow Up Date 06/13/21    - check blood pressure weekly - choose a place to take my blood pressure (home, clinic or office, retail store) - write blood pressure results in a log or diary    Why is this important?   You won't feel high blood pressure, but it can still hurt your blood vessels.  High blood pressure can cause heart or kidney problems. It can also cause a stroke.  Making lifestyle changes like losing a little weight or eating less salt will help.  Checking your blood pressure at home and at different times of the day can help to control blood pressure.  If the doctor prescribes medicine remember to take it the way the doctor ordered.  Call the office if you cannot afford the medicine or if there are questions about it.     Notes:        Patient Care Plan: CCM Pharmacy Care Plan     Problem Identified: Problem: Hypertension, Hyperlipidemia, Heart Failure, GERD, COPD, Depression, Tobacco use, and Hep C, Prediabetes      Long-Range Goal: Patient-Specific Goal   Start Date: 01/29/2021  Expected End Date: 01/29/2022  This Visit's Progress: On track  Priority: High  Note:   Current Barriers:  Unable to independently monitor therapeutic efficacy Unable to achieve control of cholesterol   Pharmacist Clinical Goal(s):  Patient will achieve adherence to monitoring guidelines and medication  adherence to achieve therapeutic efficacy achieve control of cholesterol as evidenced by next lipid panel  through collaboration with PharmD and provider.   Interventions: 1:1 collaboration with Isaac Bliss, Rayford Halsted, MD regarding development and update of comprehensive plan of care as evidenced by provider attestation and co-signature Inter-disciplinary care team collaboration (see longitudinal plan of care) Comprehensive medication review performed; medication list updated in electronic medical record  Hypertension (BP goal <130/80) -Controlled (per office readings) -Current treatment: Entresto 24-26 mg 1 tablet twice daily Spironolactone 25 mg 1 tablet daily Metoprolol succinate 25 mg 1 tablet in the evening Furosemide 40 mg 1 tablet daily -Medications previously tried: n/a  -Current home readings: 130s-140s/70-80s  -Current dietary habits: eats at home mostly; eats more red meat; needs to bake fish and fried; doesn't add salt except for tomato sandwich; sardines sometimes; does eat fast food some (once or twice a month) -Current exercise habits: limited -Denies hypotensive/hypertensive symptoms -Educated on BP goals and benefits of medications for prevention of heart attack, stroke and kidney damage; Importance of home blood pressure monitoring; Proper BP monitoring technique; Symptoms of hypotension and importance of maintaining adequate  hydration; -Counseled to monitor BP at home weekly, document, and provide log at future appointments -Counseled on diet and exercise extensively Recommended to continue current medication Recommended purchasing Omron arm cuff.  Hyperlipidemia: (LDL goal < 70) -Uncontrolled -Current treatment: Rosuvastatin 40 mg 1 tablet daily - not started -Medications previously tried: none  -Current dietary patterns: does eat out some and fries food -Current exercise habits: limited -Educated on Cholesterol goals;  Benefits of statin for ASCVD risk  reduction; Importance of limiting foods high in cholesterol; Exercise goal of 150 minutes per week; -Counseled on diet and exercise extensively Recommended to continue current medication Recommended repeat lipid panel in 2-3 months.  Heart Failure (Goal: manage symptoms and prevent exacerbations) -Controlled -Last ejection fraction: 35-40% (Date: 04/08/2019) -HF type: Combined Systolic and Diastolic -NYHA Class: I (no actitivty limitation) -AHA HF Stage: B (Heart disease present - no symptoms present) -Current treatment: Entresto 25-46 mg 1 tablet twice daily Spironolactone 25 mg 1 tablet daily Metoprolol succinate 25 mg 1 tablet in the evening Furosemide 40 mg 1 tablet daily -Medications previously tried: none  -Current home BP/HR readings: does not check -Current dietary habits: limits salt intake some -Current exercise habits: limited -Educated on Benefits of medications for managing symptoms and prolonging life Importance of weighing daily; if you gain more than 3 pounds in one day or 5 pounds in one week, call cardiologist. Importance of blood pressure control -Counseled on diet and exercise extensively Recommended to continue current medication  Depression(Goal: minimize symptoms) -Controlled -Current treatment: Bupropion XL 150 mg 1 tablet daily -Medications previously tried/failed: n/a -PHQ9: 1 -Educated on Benefits of medication for symptom control Benefits of cognitive-behavioral therapy with or without medication -Recommended to continue current medication  Insomnia (Goal: improve quality and quantity of sleep) -Uncontrolled -Current treatment  Trazodone 150 mg 1 tablet at bedtime -Medications previously tried: none  -Counseled on practicing good sleep hygiene by setting a sleep schedule and maintaining it, avoid excessive napping, following a nightly routine, avoiding screen time for 30-60 minutes before going to bed, and making the bedroom a cool, quiet and  dark space  Tobacco use (Goal quit smoking) -Uncontrolled -Previous quit attempts: Chantix -Current treatment  No medications -Patient smokes Within 30 minutes of waking -Patient triggers include: watching television and talking -On a scale of 1-10, reports MOTIVATION to quit is 3 -On a scale of 1-10, reports CONFIDENCE in quitting is 4 -Provided contact information for [place] Quit Line (1-800-QUIT-NOW) and encouraged patient to reach out to this group for support. -Educated on benefits of smoking cessation.  GERD (Goal: minimize symptoms) -Controlled -Current treatment  Omeprazole 20 mg 1 capsule daily -Medications previously tried: none  - Patient will inquire about the need to continue this medication.  Health Maintenance -Vaccine gaps: tetanus, COVID booster, influenza vaccine -Current therapy:  Multivitamin 1 tablet daily Aspirin 81 mg 1 tablet daily -Educated on Cost vs benefit of each product must be carefully weighed by individual consumer -Patient is satisfied with current therapy and denies issues -Recommended trying Voltaren gel again for pain as patient found this helpful previously.  Patient Goals/Self-Care Activities Patient will:  - take medications as prescribed check blood pressure weekly, document, and provide at future appointments target a minimum of 150 minutes of moderate intensity exercise weekly  Follow Up Plan: Telephone follow up appointment with care management team member scheduled for: 4 months      Derrick Mata was given information about Chronic Care Management services today including:  CCM  service includes personalized support from designated clinical staff supervised by his physician, including individualized plan of care and coordination with other care providers 24/7 contact phone numbers for assistance for urgent and routine care needs. Standard insurance, coinsurance, copays and deductibles apply for chronic care management only during  months in which we provide at least 20 minutes of these services. Most insurances cover these services at 100%, however patients may be responsible for any copay, coinsurance and/or deductible if applicable. This service may help you avoid the need for more expensive face-to-face services. Only one practitioner may furnish and bill the service in a calendar month. The patient may stop CCM services at any time (effective at the end of the month) by phone call to the office staff.  Patient agreed to services and verbal consent obtained.   The patient verbalized understanding of instructions, educational materials, and care plan provided today and agreed to receive a mailed copy of patient instructions, educational materials, and care plan.  Telephone follow up appointment with pharmacy team member scheduled for: 4 months  Viona Gilmore, [facility]

## 2021-02-16 ENCOUNTER — Telehealth: Payer: Self-pay | Admitting: *Deleted

## 2021-02-16 DIAGNOSIS — H9193 Unspecified hearing loss, bilateral: Secondary | ICD-10-CM

## 2021-02-16 DIAGNOSIS — I10 Essential (primary) hypertension: Secondary | ICD-10-CM | POA: Diagnosis not present

## 2021-02-16 DIAGNOSIS — I5040 Unspecified combined systolic (congestive) and diastolic (congestive) heart failure: Secondary | ICD-10-CM | POA: Diagnosis not present

## 2021-02-16 NOTE — Telephone Encounter (Signed)
-----   Message from Erline Hau, MD sent at 02/16/2021  7:04 AM EDT ----- Regarding: FW: Audiology referral [place] for audiology referral. ----- Message ----- From: Viona Gilmore, [facility] Sent: 02/16/2021   7:00 AM EDT To: Erline Hau, MD Subject: Audiology referral                             Hi,  Mr. [name] requested a referral to audiology as he feels his hearing is getting worse and has never had a hearing evaluation done. Can you please put in an order for this?  Thanks, Maddie

## 2021-03-07 DIAGNOSIS — H903 Sensorineural hearing loss, bilateral: Secondary | ICD-10-CM | POA: Diagnosis not present

## 2021-03-07 DIAGNOSIS — E785 Hyperlipidemia, unspecified: Secondary | ICD-10-CM

## 2021-03-15 ENCOUNTER — Telehealth: Payer: Self-pay | Admitting: *Deleted

## 2021-03-15 ENCOUNTER — Telehealth: Payer: Self-pay | Admitting: Pharmacist

## 2021-03-15 MED ORDER — ADULT BLOOD PRESSURE CUFF LG KIT: PACK | 0 refills | Status: DC

## 2021-03-15 MED ORDER — OMEPRAZOLE 20 MG PO CPDR: 20.0000 mg | DELAYED_RELEASE_CAPSULE | Freq: Every day | ORAL | 1 refills | Status: DC

## 2021-03-15 NOTE — Telephone Encounter (Signed)
-----   Message from Viona Gilmore, [facility] sent at 03/15/2021 12:09 PM EDT ----- Regarding: BP cuff and omeprazole refill Hi,  [name] requested a refill on his omeprazole. Can you please send this in? Also, can you try to send in a prescription for an arm BP cuff? He said he cannot afford one at the pharmacy so I wanted to see if his insurance would cover it.  Thanks! Maddie

## 2021-03-15 NOTE — Telephone Encounter (Signed)
Okay for Rx Blood pressure cuff?

## 2021-03-15 NOTE — Telephone Encounter (Signed)
Rx sent 

## 2021-03-15 NOTE — Chronic Care Management (AMB) (Addendum)
Chronic Care Management Pharmacy Assistant   Name: Derrick Mata  MRN: [id number] DOB: 09/06/1950  Reason for Encounter: Disease State/ Hypertension Assessment Call   Conditions to be addressed/monitored: HTN  Recent office visits:  None   Recent consult visits:  None   Hospital visits:  Medication Reconciliation was completed by comparing discharge summary, patient's EMR and Pharmacy list, and upon discussion with patient.  Patient presented to [facility] on 01/25/22 for Endoscopy Patient was there for 3 hours.  New?Medications Started at [facility] Discharge:?? -started None   Medication Changes at Hospital Discharge: -Changed None  Medications Discontinued at Hospital Discharge: -Stopped None  Medications that remain the same after Hospital Discharge:??  -All other medications will remain the same.    Medications: Outpatient Encounter Medications as of 03/15/2021  Medication Sig   aspirin EC 81 MG tablet Take 81 mg by mouth at bedtime.   buPROPion (WELLBUTRIN XL) 150 MG 24 hr tablet TAKE 1 TABLET(150 MG) BY MOUTH DAILY   diclofenac sodium (VOLTAREN) 1 % GEL Apply 2 g topically 2 (two) times daily. (Patient not taking: No sig reported)   furosemide (LASIX) 40 MG tablet TAKE 1 TABLET(40 MG) BY MOUTH DAILY   metoprolol succinate (TOPROL-XL) 25 MG 24 hr tablet TAKE 1 TABLET(25 MG) BY MOUTH DAILY (Patient taking differently: Take 25 mg by mouth every evening.)   Multiple Vitamin (MULTIVITAMIN WITH MINERALS) TABS tablet Take 1 tablet by mouth daily.   omeprazole (PRILOSEC) 20 MG capsule Take 1 capsule (20 mg total) by mouth daily.   rosuvastatin (CRESTOR) 40 MG tablet Take 1 tablet (40 mg total) by mouth daily.   sacubitril-valsartan (ENTRESTO) 24-26 MG Take 1 tablet by mouth 2 (two) times daily.   spironolactone (ALDACTONE) 25 MG tablet Take 1 tablet (25 mg total) by mouth daily.   traZODone (DESYREL) 150 MG tablet TAKE 1 TABLET(150 MG) BY MOUTH AT  BEDTIME   No facility-administered encounter medications on file as of 03/15/2021.   Reviewed chart prior to disease state call. Spoke with patient regarding BP  Recent Office Vitals: BP Readings from Last 3 Encounters:  01/25/21 131/64  12/05/20 114/72  10/27/20 130/80   Pulse Readings from Last 3 Encounters:  01/25/21 75  12/05/20 72  10/27/20 63    Wt Readings from Last 3 Encounters:  01/25/21 (!) 421 lb (191 kg)  12/05/20 (!) 414 lb 4 oz (187.9 kg)  10/27/20 (!) 421 lb 11.2 oz (191.3 kg)     Kidney Function Lab Results  Component Value Date/Time   CREATININE 0.88 10/27/2020 11:05 AM   CREATININE 0.85 07/19/2020 12:04 PM   CREATININE 0.94 10/03/2016 09:59 AM   CREATININE 0.81 10/18/2015 10:37 AM   GFR 86.33 10/27/2020 11:05 AM   GFRNONAA 78 07/04/2020 11:59 AM   GFRNONAA 84 10/03/2016 09:59 AM   GFRAA 90 07/04/2020 11:59 AM   GFRAA >89 10/03/2016 09:59 AM    BMP Latest Ref Rng & Units 10/27/2020 07/19/2020 07/04/2020  Glucose 70 - 99 mg/dL 85 86 75  BUN 6 - 23 mg/dL 11 11 10   Creatinine 0.40 - 1.50 mg/dL 0.88 0.85 0.97  BUN/Creat Ratio 10 - 24 - 13 10  Sodium 135 - 145 mEq/L 141 138 138  Potassium 3.5 - 5.1 mEq/L 4.9 4.6 5.3(H)  Chloride 96 - 112 mEq/L 105 100 100  CO2 19 - 32 mEq/L 28 23 22   Calcium 8.4 - 10.5 mg/dL 9.1 9.2 9.0    Current antihypertensive  regimen:  Entresto 24-26 mg 1 tablet twice daily Spironolactone 25 mg 1 tablet daily Metoprolol succinate 25 mg 1 tablet in the evening Furosemide 40 mg 1 tablet daily How often are you checking your Blood Pressure? Patient reports he has not began checking has been unable to find a cuff in his price range and budget at the moment. Patient reports that he has not had any headaches or dizziness. Current home BP readings: Not checking What recent interventions/DTPs have been made by any provider to improve Blood Pressure control since last CPP Visit: Patient reports none Any recent hospitalizations or ED visits  since last visit with CPP? None What diet changes have been made to improve Blood Pressure Control?  Patient reports he has cut back on red meat, states if wife makes fried foods they are often done in the air fryer or uses little oil. Is watching salts. What exercise is being done to improve your Blood Pressure Control?  Patient reports he is not very active  Adherence Review: Is the patient currently on ACE/ARB medication? No Does the patient have >5 day gap between last estimated fill dates? Yes   Patient reports he would like to have a refill on his omeprazole, forwarded to Clinical Pharmacist to request.  Care Gaps: BP (167/70 Hosp 01/25/21) Flu Vaccine - Done 03/06/21 [facility] COVID Booster  Therapist, music) Done 03/06/21 [facility] AWV-6/22 CCM-1/23  Star Rating Drugs: Sacubitril-Valsartan (Entresto) 24-26 MG - Last filled 11/07/20 90 DS at [facility] (PAP in process) Rosuvastatin (Crestor) 40 mg - Last filled 01/31/21 90 DS at [facility] Call to pharmacy to verify the above was on hold for over 10 min  [place] Pharmacist Assistant [phone]

## 2021-04-10 ENCOUNTER — Encounter (HOSPITAL_BASED_OUTPATIENT_CLINIC_OR_DEPARTMENT_OTHER): Payer: Self-pay

## 2021-04-10 ENCOUNTER — Other Ambulatory Visit: Payer: Self-pay

## 2021-04-10 ENCOUNTER — Emergency Department (HOSPITAL_BASED_OUTPATIENT_CLINIC_OR_DEPARTMENT_OTHER)
Admission: EM | Admit: 2021-04-10 | Discharge: 2021-04-10 | Disposition: A | Discharge status: Home / Self Care | Payer: Medicare Other | Attending: Emergency Medicine | Admitting: Emergency Medicine

## 2021-04-10 DIAGNOSIS — Z7982 Long term (current) use of aspirin: Secondary | ICD-10-CM | POA: Insufficient documentation

## 2021-04-10 DIAGNOSIS — I509 Heart failure, unspecified: Secondary | ICD-10-CM | POA: Diagnosis not present

## 2021-04-10 DIAGNOSIS — I110 Hypertensive heart disease with heart failure: Secondary | ICD-10-CM | POA: Diagnosis not present

## 2021-04-10 DIAGNOSIS — K029 Dental caries, unspecified: Secondary | ICD-10-CM | POA: Diagnosis not present

## 2021-04-10 DIAGNOSIS — Z79899 Other long term (current) drug therapy: Secondary | ICD-10-CM | POA: Diagnosis not present

## 2021-04-10 DIAGNOSIS — F17210 Nicotine dependence, cigarettes, uncomplicated: Secondary | ICD-10-CM | POA: Diagnosis not present

## 2021-04-10 DIAGNOSIS — K0889 Other specified disorders of teeth and supporting structures: Secondary | ICD-10-CM | POA: Diagnosis present

## 2021-04-10 DIAGNOSIS — J449 Chronic obstructive pulmonary disease, unspecified: Secondary | ICD-10-CM | POA: Diagnosis not present

## 2021-04-10 MED ORDER — OXYCODONE HCL 5 MG PO TABS: 5.0000 mg | ORAL_TABLET | ORAL | 0 refills | Status: DC | PRN

## 2021-04-10 MED ORDER — CLINDAMYCIN HCL 300 MG PO CAPS: 300.0000 mg | ORAL_CAPSULE | Freq: Three times a day (TID) | ORAL | 0 refills | Status: AC

## 2021-04-10 NOTE — Discharge Instructions (Addendum)
Call Dr. Theodosia Blender office to schedule an appointment.

## 2021-04-10 NOTE — ED Provider Notes (Signed)
[facility] EMERGENCY DEPT Provider Note   CSN: [id number] Arrival date & time: 04/10/21  1531     History Chief Complaint  Patient presents with   Dental Pain    Derrick Mata is a 70 y.o. male.  The history is provided by the patient.  Dental Pain Location:  Upper Quality:  Aching Severity:  Moderate Onset quality:  Gradual Timing:  Intermittent Progression:  Waxing and waning Chronicity:  New Context: dental caries   Relieved by:  Nothing Worsened by:  Nothing Associated symptoms: gum swelling   Associated symptoms: no congestion, no difficulty swallowing, no drooling, no facial pain, no facial swelling, no fever, no headaches, no neck pain, no neck swelling, no oral bleeding, no oral lesions and no trismus       Past Medical History:  Diagnosis Date   Arthritis    OA of knees   CHF (congestive heart failure) (HCC)    Closed compression fracture of L1 lumbar vertebra 09/03/2016   Colon polyp    Complication of anesthesia    slow to wake up   COPD (chronic obstructive pulmonary disease) (HCC)    > 30 pack yr smoker   Depression    Anxiety and Insomnia   Diverticulosis    Erectile dysfunction    GERD (gastroesophageal reflux disease)    Hemorrhoids    Hep C w/o coma, chronic ([place]) 06/12/2015   treated with medications   Leg edema    Morbid obesity (HCC)    Sleep apnea    refuses CPAP   Substance abuse ([place])    hx alcohol and cocaine use, 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, 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    Patient Active Problem List   Diagnosis Date Noted   Benign neoplasm of ascending colon    Portal hypertension ([place])    Portal hypertensive gastropathy ([place])    Esophageal ring    IGT (impaired glucose tolerance) 12/09/2018   History of colonic polyps    Recurrent major depressive disorder, in full remission ([place]) 07/07/2017   Aortic atherosclerosis ([place]) 07/02/2017   Frequent headaches 01/03/2017   Benign neoplasm of descending  colon    Benign neoplasm of rectum    Hyperglycemia 02/01/2016   Hepatic cirrhosis ([place]) 12/19/2015   Super obesity 07/18/2015   Obesity hypoventilation syndrome ([place]) 07/18/2015   CHF (congestive heart failure), NYHA class I ([place]) 07/18/2015   H/O: substance abuse ([place]) 07/18/2015   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   Noncompliance with CPAP treatment 07/18/2015   Moderate smoker (20 or less per day) 06/19/2015   Hep C 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, chronic ([place]) 06/12/2015   ERECTILE DYSFUNCTION 01/31/2010   KNEE PAIN, LEFT, CHRONIC 05/18/2009   TOBACCO USE 06/23/2007   Essential hypertension 11/24/2006   Osteoarthritis 11/24/2006   OSA (obstructive sleep apnea) 11/24/2006    Past Surgical History:  Procedure Laterality Date   COLONOSCOPY  04/18/2011   Procedure: COLONOSCOPY;  Surgeon: Scarlette Shorts, MD;  Location: [facility] ENDOSCOPY;  Service: Endoscopy;  Laterality: N/A;   COLONOSCOPY WITH PROPOFOL N/A 05/07/2016   Procedure: COLONOSCOPY WITH PROPOFOL;  Surgeon: Irene Shipper, MD;  Location: [facility] ENDOSCOPY;  Service: Endoscopy;  Laterality: N/A;   COLONOSCOPY WITH PROPOFOL N/A 08/12/2017   Procedure: COLONOSCOPY WITH PROPOFOL;  Surgeon: Irene Shipper, MD;  Location: [facility] ENDOSCOPY;  Service: Endoscopy;  Laterality: N/A;   COLONOSCOPY WITH PROPOFOL N/A 01/25/2021   Procedure: COLONOSCOPY WITH PROPOFOL;  Surgeon: Irene Shipper, MD;  Location: [facility] ENDOSCOPY;  Service: Endoscopy;  Laterality: N/A;   ESOPHAGOGASTRODUODENOSCOPY (EGD) WITH PROPOFOL N/A 05/07/2016   Procedure: ESOPHAGOGASTRODUODENOSCOPY (EGD) WITH PROPOFOL;  Surgeon: Irene Shipper, MD;  Location: [facility] ENDOSCOPY;  Service: Endoscopy;  Laterality: N/A;   ESOPHAGOGASTRODUODENOSCOPY (EGD) WITH PROPOFOL N/A 01/25/2021   Procedure: ESOPHAGOGASTRODUODENOSCOPY (EGD) WITH PROPOFOL;  Surgeon: Irene Shipper, MD;  Location: [facility] END[facility];  Service: Endoscopy;  Laterality: N/A;   KNEE ARTHROSCOPY  2011   left   LUMBAR LAMINECTOMY  1994, 2010   POLYPECTOMY   01/25/2021   Procedure: POLYPECTOMY;  Surgeon: Irene Shipper, MD;  Location: [facility] ENDOSCOPY;  Service: Endoscopy;;   TIBIA FRACTURE SURGERY  2006   hardware   TONSILLECTOMY  1963       Family History  Problem Relation Age of Onset   Breast cancer Mother    Heart disease Father 75   Colon cancer Sister    Cancer Sister 36       sister, colon    Heart disease Other    Stomach cancer Neg Hx    Rectal cancer Neg Hx    Esophageal cancer Neg Hx    Pancreatic cancer Neg Hx     Social History   Tobacco Use   Smoking status: Every Day    Packs/day: 1.00    Years: 51.00    Pack years: 51.00    Types: Cigarettes    Start date: 05/21/1963   Smokeless tobacco: Never   Tobacco comments:    not ready to quit yet  Vaping Use   Vaping Use: Never used  Substance Use Topics   Alcohol use: No    Alcohol/week: 0.0 standard drinks   Drug use: No    Comment: last dose November 2012    Home Medications Prior to Admission medications   Medication Sig Start Date End Date Taking? Authorizing Provider  clindamycin (CLEOCIN) 300 MG capsule Take 1 capsule (300 mg total) by mouth 3 (three) times daily for 10 days. 04/10/21 04/20/21 Yes Daesia Zylka, DO  oxyCODONE (ROXICODONE) 5 MG immediate release tablet Take 1 tablet (5 mg total) by mouth every 4 (four) hours as needed for up to 10 doses for severe pain. 04/10/21  Yes Olie Scaffidi, DO  aspirin EC 81 MG tablet Take 81 mg by mouth at bedtime.    [provider]  Blood Pressure Monitoring (ADULT BLOOD PRESSURE CUFF LG) KIT Use daily to monitor blood pressure 03/15/21   Isaac Bliss, Rayford Halsted, MD  buPROPion (WELLBUTRIN XL) 150 MG 24 hr tablet TAKE 1 TABLET(150 MG) BY MOUTH DAILY 07/04/20   Isaac Bliss, Rayford Halsted, MD  diclofenac sodium (VOLTAREN) 1 % GEL Apply 2 g topically 2 (two) times daily. Patient not taking: No sig reported 07/07/17   Colin Benton R, DO  furosemide (LASIX) 40 MG tablet TAKE 1 TABLET(40 MG) BY MOUTH DAILY 08/11/20    Isaac Bliss, Rayford Halsted, MD  metoprolol succinate (TOPROL-XL) 25 MG 24 hr tablet TAKE 1 TABLET(25 MG) BY MOUTH DAILY Patient taking differently: Take 25 mg by mouth every evening. 05/24/20   Lelon Perla, MD  Multiple Vitamin (MULTIVITAMIN WITH MINERALS) TABS tablet Take 1 tablet by mouth daily.    [provider]  omeprazole (PRILOSEC) 20 MG capsule Take 1 capsule (20 mg total) by mouth daily. 03/15/21   Isaac Bliss, Rayford Halsted, MD  rosuvastatin (CRESTOR) 40 MG tablet Take 1 tablet (40 mg total) by mouth daily. 01/30/21 04/30/21  Lelon Perla, MD  sacubitril-valsartan (ENTRESTO) 24-26 MG Take 1 tablet by mouth 2 (two) times daily. 08/01/20   Lelon Perla, MD  spironolactone (ALDACTONE) 25 MG tablet Take 1 tablet (25 mg total) by mouth daily. 07/04/20 11/19/21  Lelon Perla, MD  traZODone (DESYREL) 150 MG tablet TAKE 1 TABLET(150 MG) BY MOUTH AT BEDTIME 11/09/20   Isaac Bliss, Rayford Halsted, MD    Allergies    Penicillins and Sulfamethoxazole  Review of Systems   Review of Systems  Constitutional:  Negative for fever.  HENT:  Negative for congestion, drooling, facial swelling and mouth sores.   Musculoskeletal:  Negative for neck pain.  Neurological:  Negative for headaches.   Physical Exam Updated Vital Signs BP (!) 165/95 (BP Location: Left Wrist)   Pulse 66   Temp 98.5 F (36.9 C)   Resp (!) 22   Ht 6' (1.829 m)   Wt (!) 191 kg   SpO2 100%   BMI 57.11 kg/m   Physical Exam Constitutional:      General: He is not in acute distress.    Appearance: He is not ill-appearing.  HENT:     Head: Normocephalic.     Nose: Nose normal.     Mouth/Throat:     Mouth: Mucous membranes are moist.     Comments: There is some gingival swelling to the right upper teeth area but no obvious abscess or drainage, no trismus, no drooling, no submandibular swelling Eyes:     Pupils: Pupils are equal, round, and reactive to light.  Neurological:     Mental Status: He  is alert.    ED Results / Procedures / Treatments   Labs (all labs ordered are listed, but only abnormal results are displayed) Labs Reviewed - No data to display  EKG None  Radiology No results found.  Procedures Procedures   Medications Ordered in ED Medications - No data to display  ED Course  I have reviewed the triage vital signs and the nursing notes.  Pertinent labs & imaging results that were available during my care of the patient were reviewed by me and considered in my medical decision making (see chart for details).    MDM Rules/Calculators/A&P                           Lawrence Santiago is here for right upper dental pain.  Has dental appointment next week but unable to tolerate discomfort.  May be some gingival inflammation in the right upper teeth.  No obvious abscess.  No trismus.  No drooling.  No submandibular swelling.  Overall dental pain and will give him information to follow-up with a different dentist to see if he can get a sooner appointment.  We will write narcotic pain medicine for breakthrough pain.  We will prescribe antibiotics.  Discharged in good condition.  Understands return precautions.  This chart was dictated using voice recognition software.  Despite best efforts to proofread,  errors can occur which can change the documentation meaning.  Final Clinical Impression(s) / ED Diagnoses Final diagnoses:  Pain due to dental caries    Rx / DC Orders ED Discharge Orders          Ordered    oxyCODONE (ROXICODONE) 5 MG immediate release tablet  Every 4 hours PRN        04/10/21 1543    clindamycin (CLEOCIN) 300 MG capsule  3 times daily  04/10/21 Remy, [place], DO 04/10/21 1546

## 2021-04-10 NOTE — ED Triage Notes (Signed)
Pt presents with Right upper dental pain x1 day.

## 2021-04-24 ENCOUNTER — Other Ambulatory Visit: Payer: Self-pay | Admitting: Internal Medicine

## 2021-04-25 ENCOUNTER — Other Ambulatory Visit: Payer: Self-pay | Admitting: Internal Medicine

## 2021-04-25 DIAGNOSIS — F3342 Major depressive disorder, recurrent, in full remission: Secondary | ICD-10-CM

## 2021-05-02 ENCOUNTER — Other Ambulatory Visit: Payer: Medicare Other

## 2021-05-07 ENCOUNTER — Other Ambulatory Visit: Payer: Medicare Other

## 2021-05-10 ENCOUNTER — Other Ambulatory Visit: Payer: Self-pay | Admitting: Internal Medicine

## 2021-05-10 ENCOUNTER — Other Ambulatory Visit (INDEPENDENT_AMBULATORY_CARE_PROVIDER_SITE_OTHER): Payer: Medicare Other

## 2021-05-10 DIAGNOSIS — E785 Hyperlipidemia, unspecified: Secondary | ICD-10-CM

## 2021-05-10 LAB — LIPID PANEL
Cholesterol: 116 mg/dL (ref 0–200)
HDL: 37.5 mg/dL — ABNORMAL LOW (ref >39.00)
LDL Cholesterol: 65 mg/dL (ref 0–99)
NonHDL: 78.23
Total CHOL/HDL Ratio: 3
Triglycerides: 65 mg/dL (ref 0.0–149.0)
VLDL: 13 mg/dL (ref 0.0–40.0)

## 2021-05-10 LAB — HEMOGLOBIN A1C: Hgb A1c MFr Bld: 6.1 % (ref 4.6–6.5)

## 2021-06-11 ENCOUNTER — Telehealth: Payer: Self-pay | Admitting: Pharmacist

## 2021-06-11 NOTE — Chronic Care Management (AMB) (Signed)
° ° °  Chronic Care Management Pharmacy Assistant   Name: Derrick Mata  MRN: [id number] DOB: [birth year]  06/11/21 APPOINTMENT REMINDER   Patients wife was reminded of appointment and will advise patient to have all medications, supplements and any blood glucose and blood pressure readings available for review with [name], Pharm. D, for telephone visit on 06/04/21 at 3.   Care Gaps: BP (167/70 Hosp 01/25/21) Flu Vaccine - Done 03/06/21 [facility] COVID Booster  Therapist, music) Done 03/06/21 [facility] AWV-6/22 CCM-1/23  Star Rating Drug: Sacubitril-Valsartan (Entresto) 24-26 MG - Last filled 06/01/21 90 DS at [facility] (PAP in process) Rosuvastatin (Crestor) 40 mg - Last filled 05/16/21 90 DS at [facility]  Any gaps in medications fill history? None Called to verify as accurate    Medications: Outpatient Encounter Medications as of 06/11/2021  Medication Sig   aspirin EC 81 MG tablet Take 81 mg by mouth at bedtime.   Blood Pressure Monitoring (ADULT BLOOD PRESSURE CUFF LG) KIT Use daily to monitor blood pressure   buPROPion (WELLBUTRIN XL) 150 MG 24 hr tablet TAKE 1 TABLET(150 MG) BY MOUTH DAILY   diclofenac sodium (VOLTAREN) 1 % GEL Apply 2 g topically 2 (two) times daily. (Patient not taking: No sig reported)   furosemide (LASIX) 40 MG tablet TAKE 1 TABLET(40 MG) BY MOUTH DAILY   metoprolol succinate (TOPROL-XL) 25 MG 24 hr tablet TAKE 1 TABLET(25 MG) BY MOUTH DAILY (Patient taking differently: Take 25 mg by mouth every evening.)   Multiple Vitamin (MULTIVITAMIN WITH MINERALS) TABS tablet Take 1 tablet by mouth daily.   omeprazole (PRILOSEC) 20 MG capsule Take 1 capsule (20 mg total) by mouth daily.   oxyCODONE (ROXICODONE) 5 MG immediate release tablet Take 1 tablet (5 mg total) by mouth every 4 (four) hours as needed for up to 10 doses for severe pain.   rosuvastatin (CRESTOR) 40 MG tablet Take 1 tablet (40 mg total) by mouth daily.   sacubitril-valsartan (ENTRESTO) 24-26 MG Take 1 tablet  by mouth 2 (two) times daily.   spironolactone (ALDACTONE) 25 MG tablet Take 1 tablet (25 mg total) by mouth daily.   traZODone (DESYREL) 150 MG tablet TAKE 1 TABLET(150 MG) BY MOUTH AT BEDTIME   No facility-administered encounter medications on file as of 06/11/2021.     [place] Clinical Pharmacist Assistant [phone]

## 2021-06-13 ENCOUNTER — Ambulatory Visit (INDEPENDENT_AMBULATORY_CARE_PROVIDER_SITE_OTHER): Payer: Medicare Other | Admitting: Pharmacist

## 2021-06-13 DIAGNOSIS — I5040 Unspecified combined systolic (congestive) and diastolic (congestive) heart failure: Secondary | ICD-10-CM

## 2021-06-13 DIAGNOSIS — I10 Essential (primary) hypertension: Secondary | ICD-10-CM

## 2021-06-13 NOTE — Progress Notes (Signed)
Chronic Care Management Pharmacy Note  06/13/2021 Name:  Derrick Mata MRN:  [id number] DOB:  [birth year]  Summary: BP at goal < 130/80 LDL at goal < 70  Recommendations/Changes made from today's visit: -Recommended purchasing a BP cuff and monitoring at home -Recommended restarting Voltaren gel for joint pain  Plan: Mail PAP for Entresto Follow up BP assessment in 2-3 months  Subjective: Derrick Mata is an 71 y.o. year old male who is a primary patient of Isaac Bliss, Rayford Halsted, MD.  The CCM team was consulted for assistance with disease management and care coordination needs.    Engaged with patient by telephone for follow up visit in response to provider referral for pharmacy case management and/or care coordination services.   Consent to Services:  The patient was given information about Chronic Care Management services, agreed to services, and gave verbal consent prior to initiation of services.  Please see initial visit note for detailed documentation.   Patient Care Team: Isaac Bliss, Rayford Halsted, MD as PCP - General (Internal Medicine) Stanford Breed Denice Bors, MD as PCP - Cardiology (Cardiology) Comer, Okey Regal, MD as Consulting Physician (Infectious Diseases) Monna Fam, MD as Consulting Physician (Ophthalmology) Viona Gilmore, [facility] as Pharmacist (Pharmacist)  Recent office visits: 10/27/20 Domingo Mend, MD: Patient presented for annual exam.   Recent consult visits: 12/05/20 Scarlette Shorts, MD (gastro): Patient presented for history of colonic polyps. Plan for colonoscopy at the hospital due to high risk.  Hospital visits: 04/10/21 Patient presented to [place] ED for dental pain. Prescribed oxycodone and clindamycin.   Medication Reconciliation was completed by comparing discharge summary, patients EMR and Pharmacy list, and upon discussion with patient.   Patient presented to [facility] on 01/25/21 for Endoscopy  Patient was there for 3 hours.   New?Medications Started at [name] Discharge:?? -started None    Medication Changes at Hospital Discharge: -Changed None   Medications Discontinued at Hospital Discharge: -Stopped None   Medications that remain the same after Hospital Discharge:??  -All other medications will remain the same.    Objective:  Lab Results  Component Value Date   CREATININE 0.88 10/27/2020   BUN 11 10/27/2020   GFR 86.33 10/27/2020   GFRNONAA 78 07/04/2020   GFRAA 90 07/04/2020   NA 141 10/27/2020   K 4.9 10/27/2020   CALCIUM 9.1 10/27/2020   CO2 28 10/27/2020   GLUCOSE 85 10/27/2020    Lab Results  Component Value Date/Time   HGBA1C 6.1 05/10/2021 10:03 AM   HGBA1C 5.9 10/27/2020 11:05 AM   GFR 86.33 10/27/2020 11:05 AM   GFR 100.97 12/08/2018 12:17 PM    Last diabetic Eye exam:  Lab Results  Component Value Date/Time   HMDIABEYEEXA No Retinopathy 03/25/2016 12:00 AM    Last diabetic Foot exam: No results found for: HMDIABFOOTEX   Lab Results  Component Value Date   CHOL 116 05/10/2021   HDL 37.50 (L) 05/10/2021   LDLCALC 65 05/10/2021   TRIG 65.0 05/10/2021   CHOLHDL 3 05/10/2021    Hepatic Function Latest Ref Rng & Units 10/27/2020 12/08/2018 10/03/2016  Total Protein 6.0 - 8.3 g/dL 7.1 6.7 7.0  Albumin 3.5 - 5.2 g/dL 3.7 3.6 3.6  AST 0 - 37 U/L 11 10 12   ALT 0 - 53 U/L 7 6 6(L)  Alk Phosphatase 39 - 117 U/L 54 55 52  Total Bilirubin 0.2 - 1.2 mg/dL 0.5 0.4 0.4  Bilirubin, Direct 0.0 -  0.3 mg/dL - - -    Lab Results  Component Value Date/Time   TSH 2.06 10/27/2020 11:05 AM   TSH 1.98 12/08/2018 12:17 PM   FREET4 1.12 03/29/2011 06:15 AM    CBC Latest Ref Rng & Units 10/27/2020 12/08/2018 02/19/2018  WBC 4.0 - 10.5 K/uL 8.3 8.2 8.4  Hemoglobin 13.0 - 17.0 g/dL 14.2 13.9 14.1  Hematocrit 39.0 - 52.0 % 42.6 42.9 43.9  Platelets 150.0 - 400.0 K/uL 277.0 269.0 243.0    Lab Results  Component Value Date/Time   VD25OH 34.48 10/27/2020  11:05 AM   VD25OH 34.00 12/08/2018 12:17 PM    Clinical ASCVD: Yes  The ASCVD Risk score (Arnett DK, et al., 2019) failed to calculate for the following reasons:   The valid total cholesterol range is 130 to 320 mg/dL    Depression screen [facility] 2/9 10/27/2020 07/04/2020 12/08/2018  Decreased Interest 0 0 0  Down, Depressed, Hopeless 0 0 0  PHQ - 2 Score 0 0 0  Altered sleeping 0 1 1  Tired, decreased energy 1 0 0  Change in appetite 0 0 0  Feeling bad or failure about yourself  0 0 0  Trouble concentrating 0 0 0  Moving slowly or fidgety/restless 0 0 0  Suicidal thoughts 0 0 0  PHQ-9 Score 1 1 1   Difficult doing work/chores - Not difficult at all Not difficult at all  Some recent data might be hidden      Social History   Tobacco Use  Smoking Status Every Day   Packs/day: 1.00   Years: 51.00   Pack years: 51.00   Types: Cigarettes   Start date: 05/21/1963  Smokeless Tobacco Never  Tobacco Comments   not ready to quit yet   BP Readings from Last 3 Encounters:  04/10/21 (!) 165/95  01/25/21 131/64  12/05/20 114/72   Pulse Readings from Last 3 Encounters:  04/10/21 66  01/25/21 75  12/05/20 72   Wt Readings from Last 3 Encounters:  04/10/21 (!) 421 lb 1.3 oz (191 kg)  01/25/21 (!) 421 lb (191 kg)  12/05/20 (!) 414 lb 4 oz (187.9 kg)   BMI Readings from Last 3 Encounters:  04/10/21 57.11 kg/m  01/25/21 57.10 kg/m  12/05/20 54.65 kg/m    Assessment/Interventions: Review of patient past medical history, allergies, medications, health status, including review of consultants reports, laboratory and other test data, was performed as part of comprehensive evaluation and provision of chronic care management services.   SDOH:  (Social Determinants of Health) assessments and interventions performed: No   SDOH Screenings   Alcohol Screen: Not on file  Depression (PHQ2-9): Low Risk    PHQ-2 Score: 1  Financial Resource Strain: Low Risk    Difficulty of Paying Living  Expenses: Not very hard  Food Insecurity: Not on file  Housing: Not on file  Physical Activity: Not on file  Social Connections: Not on file  Stress: Not on file  Tobacco Use: High Risk   Smoking Tobacco Use: Every Day   Smokeless Tobacco Use: Never   Passive Exposure: Not on file  Transportation Needs: No Transportation Needs   Lack of Transportation (Medical): No   Lack of Transportation (Non-Medical): No    CCM Care Plan  Allergies  Allergen Reactions   Penicillins Anaphylaxis, Nausea Only and Other (See Comments)    Passed out. Has patient had a PCN reaction causing immediate rash, facial/tongue/throat swelling, SOB or lightheadedness with hypotension: {no Has patient had  a PCN reaction causing severe rash involving mucus membranes or skin necrosis: {no Has patient had a PCN reaction that required hospitalization {no Has patient had a PCN reaction occurring within the last 10 years: {no If all of the above answers are "NO", then may proceed with Cephalosporin use.   Sulfamethoxazole Hives    Medications Reviewed Today     Reviewed by Viona Gilmore, [facility] (Pharmacist) on 06/13/21 at 1600  Med List Status: <None>   Medication Order Taking? Sig Documenting Provider Last Dose Status Informant  aspirin EC 81 MG tablet [id number]  Take 81 mg by mouth at bedtime. [provider]  Active Self  Blood Pressure Monitoring (ADULT BLOOD PRESSURE CUFF LG) KIT [id number]  Use daily to monitor blood pressure Isaac Bliss, Rayford Halsted, MD  Active   buPROPion (WELLBUTRIN XL) 150 MG 24 hr tablet [id number]  TAKE 1 TABLET(150 MG) BY MOUTH DAILY Isaac Bliss, Rayford Halsted, MD  Active   diclofenac sodium (VOLTAREN) 1 % GEL [id number]  Apply 2 g topically 2 (two) times daily.  Patient not taking: No sig reported   Lucretia Kern, DO  Active   furosemide (LASIX) 40 MG tablet [id number]  TAKE 1 TABLET(40 MG) BY MOUTH DAILY Isaac Bliss, Rayford Halsted, MD  Active   metoprolol succinate  (TOPROL-XL) 25 MG 24 hr tablet [id number]  TAKE 1 TABLET(25 MG) BY MOUTH DAILY  Patient taking differently: Take 25 mg by mouth every evening.   Lelon Perla, MD  Active   Multiple Vitamin (MULTIVITAMIN WITH MINERALS) TABS tablet [id number]  Take 1 tablet by mouth daily. [provider]  Active Self  omeprazole (PRILOSEC) 20 MG capsule [id number]  Take 1 capsule (20 mg total) by mouth daily. Isaac Bliss, Rayford Halsted, MD  Active   rosuvastatin (CRESTOR) 40 MG tablet [id number] Yes Take 1 tablet (40 mg total) by mouth daily. Lelon Perla, MD Taking Active   sacubitril-valsartan [facility]) 24-26 [place] [id number] Yes Take 1 tablet by mouth 2 (two) times daily. Lelon Perla, MD Taking Active Self  spironolactone (ALDACTONE) 25 MG tablet [id number] Yes Take 1 tablet (25 mg total) by mouth daily. Lelon Perla, MD Taking Active Self  traZODone (DESYREL) 150 MG tablet [id number]  TAKE 1 TABLET(150 MG) BY MOUTH AT BEDTIME Isaac Bliss, Rayford Halsted, MD  Active Self            Patient Active Problem List   Diagnosis Date Noted   Benign neoplasm of ascending colon    Portal hypertension ([place])    Portal hypertensive gastropathy ([place])    Esophageal ring    IGT (impaired glucose tolerance) 12/09/2018   History of colonic polyps    Recurrent major depressive disorder, in full remission ([place]) 07/07/2017   Aortic atherosclerosis ([place]) 07/02/2017   Frequent headaches 01/03/2017   Benign neoplasm of descending colon    Benign neoplasm of rectum    Hyperglycemia 02/01/2016   Hepatic cirrhosis ([place]) 12/19/2015   Super obesity 07/18/2015   Obesity hypoventilation syndrome ([place]) 07/18/2015   CHF (congestive heart failure), NYHA class I ([place]) 07/18/2015   H/O: substance abuse ([place]) 07/18/2015   OSA and COPD overlap syndrome ([place]) 07/18/2015   Noncompliance with CPAP treatment 07/18/2015   Moderate smoker (20 or less per day) 06/19/2015   Hep C w/o coma, chronic ([place]) 06/12/2015    ERECTILE DYSFUNCTION 01/31/2010   KNEE PAIN, LEFT, CHRONIC 05/18/2009   TOBACCO USE 06/23/2007   Essential hypertension 11/24/2006   Osteoarthritis  11/24/2006   OSA (obstructive sleep apnea) 11/24/2006    Immunization History  Administered Date(s) Administered   Hepatitis A, Adult 12/19/2015, 10/03/2016   Hepatitis B, adult 08/21/2015, 04/04/2016   Hepatitis B, ped/adol 07/19/2015   Influenza Split 03/28/2011   Influenza Whole 06/23/2007, 02/15/2009, 01/31/2010   Influenza, High Dose Seasonal PF 02/01/2016, 03/10/2017, 02/19/2018, 02/20/2019   Influenza,inj,Quad PF,6+ Mos 01/12/2013, 02/25/2014   Influenza-Unspecified 01/19/2015, 02/17/2017, 05/03/2020, 03/06/2021   PFIZER Comirnaty([place])Covid-19 Tri-Sucrose Vaccine 06/26/2019, 07/17/2019, 03/03/2020, 03/06/2021   Pneumococcal Conjugate-13 09/08/2014   Pneumococcal Polysaccharide-23 07/17/2015   Zoster Recombinat (Shingrix) 04/02/2017, 07/29/2017   Zoster, Live 02/08/2012   Lab Results  Component Value Date   CHOL 116 05/10/2021   HDL 37.50 (L) 05/10/2021   LDLCALC 65 05/10/2021   TRIG 65.0 05/10/2021   CHOLHDL 3 05/10/2021    Conditions to be addressed/monitored:  Hypertension, Hyperlipidemia, Heart Failure, GERD, COPD, Depression, Tobacco use, and Hep C, pre-diabetes  Conditions addressed this visit: Hypertension, heart failure, tobacco use  Care Plan : [place]  Updates made by Viona Gilmore, [place] since 06/13/2021 12:00 AM     Problem: Problem: Hypertension, Hyperlipidemia, Heart Failure, GERD, COPD, Depression, Tobacco use, and Hep C, Prediabetes      Long-Range Goal: Patient-Specific Goal   Start Date: 01/29/2021  Expected End Date: 01/29/2022  Recent Progress: On track  Priority: High  Note:   Current Barriers:  Unable to independently monitor therapeutic efficacy Unable to achieve control of cholesterol   Pharmacist Clinical Goal(s):  Patient will achieve adherence to monitoring  guidelines and medication adherence to achieve therapeutic efficacy achieve control of cholesterol as evidenced by next lipid panel  through collaboration with PharmD and provider.   Interventions: 1:1 collaboration with Isaac Bliss, Rayford Halsted, MD regarding development and update of comprehensive plan of care as evidenced by provider attestation and co-signature Inter-disciplinary care team collaboration (see longitudinal plan of care) Comprehensive medication review performed; medication list updated in electronic medical record  Hypertension (BP goal <130/80) -Controlled (per office readings) -Current treatment: Entresto 24-26 mg 1 tablet twice daily - Appropriate, Effective, Safe, Accessible Spironolactone 25 mg 1 tablet daily - Appropriate, Effective, Safe, Accessible Metoprolol succinate 25 mg 1 tablet in the evening - Appropriate, Effective, Safe, Accessible Furosemide 40 mg 1 tablet daily - Appropriate, Effective, Safe, Accessible -Medications previously tried: n/a  -Current home readings: 130s-140s/70-80s  -Current dietary habits: eats at home mostly; eats more red meat; needs to bake fish and fried; doesn't add salt except for tomato sandwich; sardines sometimes; does eat fast food some (once or twice a month) -Current exercise habits: limited -Denies hypotensive/hypertensive symptoms -Educated on BP goals and benefits of medications for prevention of heart attack, stroke and kidney damage; Importance of home blood pressure monitoring; Proper BP monitoring technique; Symptoms of hypotension and importance of maintaining adequate hydration; -Counseled to monitor BP at home weekly, document, and provide log at future appointments -Counseled on diet and exercise extensively Recommended to continue current medication Recommended purchasing Omron arm cuff.  Hyperlipidemia: (LDL goal < 70) -Controlled -Current treatment: Rosuvastatin 40 mg 1 tablet daily - Appropriate,  Effective, Safe, Accessible -Medications previously tried: none  -Current dietary patterns: does eat out some and fries food -Current exercise habits: limited -Educated on Cholesterol goals;  Benefits of statin for ASCVD risk reduction; Importance of limiting foods high in cholesterol; Exercise goal of 150 minutes per week; -Counseled on diet and exercise extensively Recommended to continue current medication Recommended repeat lipid  panel in 2-3 months.  Heart Failure (Goal: manage symptoms and prevent exacerbations) -Controlled -Last ejection fraction: 35-40% (Date: 04/08/2019) -HF type: Combined Systolic and Diastolic -NYHA Class: I (no actitivty limitation) -AHA HF Stage: B (Heart disease present - no symptoms present) -Current treatment: Entresto 25-46 mg 1 tablet twice daily - Appropriate, Effective, Safe, Accessible Spironolactone 25 mg 1 tablet daily - Appropriate, Effective, Safe, Accessible Metoprolol succinate 25 mg 1 tablet in the evening - Appropriate, Effective, Safe, Accessible Furosemide 40 mg 1 tablet daily - Appropriate, Effective, Safe, Accessible -Medications previously tried: none  -Current home BP/HR readings: does not check -Current dietary habits: limits salt intake some -Current exercise habits: limited -Educated on Benefits of medications for managing symptoms and prolonging life Importance of weighing daily; if you gain more than 3 pounds in one day or 5 pounds in one week, call cardiologist. Importance of blood pressure control -Counseled on diet and exercise extensively Recommended to continue current medication  Depression(Goal: minimize symptoms) -Controlled -Current treatment: Bupropion XL 150 mg 1 tablet daily - Appropriate, Effective, Safe, Accessible -Medications previously tried/failed: n/a -PHQ9: 1 -Educated on Benefits of medication for symptom control Benefits of cognitive-behavioral therapy with or without medication -Recommended to  continue current medication  Insomnia (Goal: improve quality and quantity of sleep) -Uncontrolled -Current treatment  Trazodone 150 mg 1 tablet at bedtime - Appropriate, Effective, Safe, Accessible -Medications previously tried: none  -Counseled on practicing good sleep hygiene by setting a sleep schedule and maintaining it, avoid excessive napping, following a nightly routine, avoiding screen time for 30-60 minutes before going to bed, and making the bedroom a cool, quiet and dark space  Tobacco use (Goal quit smoking) -Uncontrolled -Previous quit attempts: Chantix -Current treatment  No medications -Patient smokes Within 30 minutes of waking -Patient triggers include: watching television and talking -On a scale of 1-10, reports MOTIVATION to quit is 3 -On a scale of 1-10, reports CONFIDENCE in quitting is 4 -Provided contact information for [place] [facility]uit Line (1-800-QUIT-NOW) and encouraged patient to reach out to this group for support. -Educated on benefits of smoking cessation.  GERD (Goal: minimize symptoms) -Controlled -Current treatment  Omeprazole 20 mg 1 capsule daily - Appropriate, Effective, Query Safe, Accessible -Medications previously tried: none  -Recommended to continue current medication  Health Maintenance -Vaccine gaps: tetanus, COVID booster, influenza vaccine -Current therapy:  Multivitamin 1 tablet daily Aspirin 81 mg 1 tablet daily -Educated on Cost vs benefit of each product must be carefully weighed by individual consumer -Patient is satisfied with current therapy and denies issues -Recommended trying Voltaren gel again for pain as patient found this helpful previously.  Patient Goals/Self-Care Activities Patient will:  - take medications as prescribed check blood pressure weekly, document, and provide at future appointments target a minimum of 150 minutes of moderate intensity exercise weekly  Follow Up Plan: Telephone follow up appointment with care  management team member scheduled for: 6 months       Medication Assistance: None required.  Patient affirms current coverage meets needs.  Compliance/Adherence/Medication fill history: Care Gaps: COVID booster BP (167/70 Hosp 01/25/21)  Star-Rating Drugs: Rosuvastatin (Crestor) 40 mg - Last filled 05/16/21 90 DS [facility]  Patient's preferred pharmacy is:  [facility], [place] [facility] [address] Phone: [phone] Fax: [place], [place] [address] [facility] [place] [address] Phone: [phone] Fax: [phone]  [place], [place] [address] Phone: [phone] Fax: [phone]   Uses pill box? Yes - 2 weeks at a time Pt endorses 99% compliance  We discussed: Current pharmacy is preferred with insurance plan and patient is satisfied with pharmacy services Patient decided to: Continue current medication management strategy  Care Plan and Follow Up Patient Decision:  Patient agrees to Care Plan and Follow-up.  Plan: Telephone follow up appointment with care management team member scheduled for:  6 months  [name], PharmD, [place] Pharmacist [place] at [facility] [phone]

## 2021-06-13 NOTE — Patient Instructions (Signed)
Hi Derrick Mata,  It was great to catch up with you again!  Please reach out to me if you have any questions or need anything before our follow up!  Best, Maddie  [name], PharmD, [facility]   Visit Information   Goals Addressed   None    Patient Care Plan: CCM Pharmacy Care Plan     Problem Identified: Problem: Hypertension, Hyperlipidemia, Heart Failure, GERD, COPD, Depression, Tobacco use, and Hep C, Prediabetes      Long-Range Goal: Patient-Specific Goal   Start Date: 01/29/2021  Expected End Date: 01/29/2022  Recent Progress: On track  Priority: High  Note:   Current Barriers:  Unable to independently monitor therapeutic efficacy Unable to achieve control of cholesterol   Pharmacist Clinical Goal(s):  Patient will achieve adherence to monitoring guidelines and medication adherence to achieve therapeutic efficacy achieve control of cholesterol as evidenced by next lipid panel  through collaboration with PharmD and provider.   Interventions: 1:1 collaboration with Isaac Bliss, Rayford Halsted, MD regarding development and update of comprehensive plan of care as evidenced by provider attestation and co-signature Inter-disciplinary care team collaboration (see longitudinal plan of care) Comprehensive medication review performed; medication list updated in electronic medical record  Hypertension (BP goal <130/80) -Controlled (per office readings) -Current treatment: Entresto 24-26 mg 1 tablet twice daily - Appropriate, Effective, Safe, Accessible Spironolactone 25 mg 1 tablet daily - Appropriate, Effective, Safe, Accessible Metoprolol succinate 25 mg 1 tablet in the evening - Appropriate, Effective, Safe, Accessible Furosemide 40 mg 1 tablet daily - Appropriate, Effective, Safe, Accessible -Medications previously tried: n/a  -Current home readings: 130s-140s/70-80s  -Current dietary habits: eats at home  mostly; eats more red meat; needs to bake fish and fried; doesn't add salt except for tomato sandwich; sardines sometimes; does eat fast food some (once or twice a month) -Current exercise habits: limited -Denies hypotensive/hypertensive symptoms -Educated on BP goals and benefits of medications for prevention of heart attack, stroke and kidney damage; Importance of home blood pressure monitoring; Proper BP monitoring technique; Symptoms of hypotension and importance of maintaining adequate hydration; -Counseled to monitor BP at home weekly, document, and provide log at future appointments -Counseled on diet and exercise extensively Recommended to continue current medication Recommended purchasing Omron arm cuff.  Hyperlipidemia: (LDL goal < 70) -Controlled -Current treatment: Rosuvastatin 40 mg 1 tablet daily - Appropriate, Effective, Safe, Accessible -Medications previously tried: none  -Current dietary patterns: does eat out some and fries food -Current exercise habits: limited -Educated on Cholesterol goals;  Benefits of statin for ASCVD risk reduction; Importance of limiting foods high in cholesterol; Exercise goal of 150 minutes per week; -Counseled on diet and exercise extensively Recommended to continue current medication Recommended repeat lipid panel in 2-3 months.  Heart Failure (Goal: manage symptoms and prevent exacerbations) -Controlled -Last ejection fraction: 35-40% (Date: 04/08/2019) -HF type: Combined Systolic and Diastolic -NYHA Class: I (no actitivty limitation) -AHA HF Stage: B (Heart disease present - no symptoms present) -Current treatment: Entresto 25-46 mg 1 tablet twice daily - Appropriate, Effective, Safe, Accessible Spironolactone 25 mg 1 tablet daily - Appropriate, Effective, Safe, Accessible Metoprolol succinate 25 mg 1 tablet in the evening - Appropriate, Effective, Safe, Accessible Furosemide 40 mg 1 tablet daily - Appropriate, Effective, Safe,  Accessible -Medications previously tried: none  -Current home BP/HR readings: does not check -Current dietary habits: limits salt intake some -Current exercise habits: limited -Educated on Benefits of medications for managing symptoms and  prolonging life Importance of weighing daily; if you gain more than 3 pounds in one day or 5 pounds in one week, call cardiologist. Importance of blood pressure control -Counseled on diet and exercise extensively Recommended to continue current medication  Depression(Goal: minimize symptoms) -Controlled -Current treatment: Bupropion XL 150 mg 1 tablet daily - Appropriate, Effective, Safe, Accessible -Medications previously tried/failed: n/a -PHQ9: 1 -Educated on Benefits of medication for symptom control Benefits of cognitive-behavioral therapy with or without medication -Recommended to continue current medication  Insomnia (Goal: improve quality and quantity of sleep) -Uncontrolled -Current treatment  Trazodone 150 mg 1 tablet at bedtime - Appropriate, Effective, Safe, Accessible -Medications previously tried: none  -Counseled on practicing good sleep hygiene by setting a sleep schedule and maintaining it, avoid excessive napping, following a nightly routine, avoiding screen time for 30-60 minutes before going to bed, and making the bedroom a cool, quiet and dark space  Tobacco use (Goal quit smoking) -Uncontrolled -Previous quit attempts: Chantix -Current treatment  No medications -Patient smokes Within 30 minutes of waking -Patient triggers include: watching television and talking -On a scale of 1-10, reports MOTIVATION to quit is 3 -On a scale of 1-10, reports CONFIDENCE in quitting is 4 -Provided contact information for [place] Quit Line (1-800-QUIT-NOW) and encouraged patient to reach out to this group for support. -Educated on benefits of smoking cessation.  GERD (Goal: minimize symptoms) -Controlled -Current treatment  Omeprazole 20 mg  1 capsule daily - Appropriate, Effective, Query Safe, Accessible -Medications previously tried: none  -Recommended to continue current medication  Health Maintenance -Vaccine gaps: tetanus, COVID booster, influenza vaccine -Current therapy:  Multivitamin 1 tablet daily Aspirin 81 mg 1 tablet daily -Educated on Cost vs benefit of each product must be carefully weighed by individual consumer -Patient is satisfied with current therapy and denies issues -Recommended trying Voltaren gel again for pain as patient found this helpful previously.  Patient Goals/Self-Care Activities Patient will:  - take medications as prescribed check blood pressure weekly, document, and provide at future appointments target a minimum of 150 minutes of moderate intensity exercise weekly  Follow Up Plan: Telephone follow up appointment with care management team member scheduled for: 6 months       Patient verbalizes understanding of instructions and care plan provided today and agrees to view in [place]. Active MyChart status confirmed with patient.   Telephone follow up appointment with pharmacy team member scheduled for: 6 months  Viona Gilmore, [facility]

## 2021-06-14 NOTE — Progress Notes (Signed)
Per MP Patient Assistance Application for Entresto completed and mailed to patient on today with instructions for completion. Did not call patient to advise her MP  Patient Assistance: Delene Loll - Mailed on 06/14/21  [place] Clinical Pharmacist Assistant [phone]

## 2021-06-15 ENCOUNTER — Telehealth (INDEPENDENT_AMBULATORY_CARE_PROVIDER_SITE_OTHER): Payer: Medicare Other | Admitting: Family Medicine

## 2021-06-15 ENCOUNTER — Encounter: Payer: Self-pay | Admitting: Family Medicine

## 2021-06-15 ENCOUNTER — Other Ambulatory Visit: Payer: Self-pay | Admitting: Family Medicine

## 2021-06-15 DIAGNOSIS — J449 Chronic obstructive pulmonary disease, unspecified: Secondary | ICD-10-CM | POA: Diagnosis not present

## 2021-06-15 DIAGNOSIS — U071 COVID-19: Secondary | ICD-10-CM | POA: Diagnosis not present

## 2021-06-15 MED ORDER — ALBUTEROL SULFATE HFA 108 (90 BASE) MCG/ACT IN AERS
2.0000 | INHALATION_SPRAY | Freq: Four times a day (QID) | RESPIRATORY_TRACT | 0 refills | Status: DC | PRN
Start: 2021-06-15 — End: 2022-09-03

## 2021-06-15 MED ORDER — MOLNUPIRAVIR EUA 200MG CAPSULE: 4.0000 | ORAL_CAPSULE | Freq: Two times a day (BID) | ORAL | 0 refills | Status: DC

## 2021-06-15 MED ORDER — MOLNUPIRAVIR EUA 200MG CAPSULE: 4.0000 | ORAL_CAPSULE | Freq: Two times a day (BID) | ORAL | 0 refills | Status: AC

## 2021-06-15 MED ORDER — CORICIDIN HBP COUGH/COLD 4-30 MG PO TABS: ORAL_TABLET | ORAL | 0 refills | Status: DC

## 2021-06-15 MED ORDER — SPACER/AERO-HOLDING CHAMBERS DEVI: 1.0000 | Freq: Every day | 0 refills | Status: DC | PRN

## 2021-06-15 NOTE — Progress Notes (Signed)
Virtual Visit via Video Note  I connected with Derrick Mata on 06/15/21 at  3:00 PM EST by a video enabled telemedicine application and verified that I am speaking with the correct person using two identifiers.  Location patient: home Location provider: [place] [place], [place] [id number] Persons participating in the virtual visit: patient, provider  I discussed the limitations of evaluation and management by telemedicine and the availability of in person appointments. The patient expressed understanding and agreed to proceed.  Video feed continued to come up with error, so visit was completed by phone.   Derrick Mata DOB: [birth year] Encounter date: 06/15/2021  This is a 71 y.o. male who presents with Chief Complaint  Patient presents with   Covid Positive    Patient states the home Covid test was positive this morning   Nasal Congestion    X2-3 days    History of present illness: Wife had COVID all week; she is doing better now. Sneezing, running nose. Breathing is ok. More sneezing than coughing. Keeps blowing nose. No fever. No sinus pain/pressure. Thought he was going to have headache; but went away.   First time having covid. He has had 4 immunizations; last in October.   Taking coricidin for sick symptoms. Thinks that this helped him out.     Allergies  Allergen Reactions   Penicillins Anaphylaxis, Nausea Only and Other (See Comments)    Passed out. Has patient had a PCN reaction causing immediate rash, facial/tongue/throat swelling, SOB or lightheadedness with hypotension: {no Has patient had a PCN reaction causing severe rash involving mucus membranes or skin necrosis: {no Has patient had a PCN reaction that required hospitalization {no Has patient had a PCN reaction occurring within the last 10 years: {no If all of the above answers are "NO", then may proceed with Cephalosporin use.   Sulfamethoxazole Hives   Current Meds   Medication Sig   albuterol (VENTOLIN HFA) 108 (90 Base) MCG/ACT inhaler Inhale 2 puffs into the lungs every 6 (six) hours as needed for wheezing or shortness of breath.   aspirin EC 81 MG tablet Take 81 mg by mouth at bedtime.   Blood Pressure Monitoring (ADULT BLOOD PRESSURE CUFF LG) KIT Use daily to monitor blood pressure   buPROPion (WELLBUTRIN XL) 150 MG 24 hr tablet TAKE 1 TABLET(150 MG) BY MOUTH DAILY   Chlorpheniramine-DM (CORICIDIN COUGH/COLD) 4-30 MG TABS Take per manufacturers instructions.   furosemide (LASIX) 40 MG tablet TAKE 1 TABLET(40 MG) BY MOUTH DAILY   metoprolol succinate (TOPROL-XL) 25 MG 24 hr tablet TAKE 1 TABLET(25 MG) BY MOUTH DAILY (Patient taking differently: Take 25 mg by mouth every evening.)   Multiple Vitamin (MULTIVITAMIN WITH MINERALS) TABS tablet Take 1 tablet by mouth daily.   omeprazole (PRILOSEC) 20 MG capsule Take 1 capsule (20 mg total) by mouth daily.   sacubitril-valsartan (ENTRESTO) 24-26 MG Take 1 tablet by mouth 2 (two) times daily.   Spacer/Aero-Holding Chambers DEVI 1 Device by Does not apply route daily as needed.   spironolactone (ALDACTONE) 25 MG tablet Take 1 tablet (25 mg total) by mouth daily.   traZODone (DESYREL) 150 MG tablet TAKE 1 TABLET(150 MG) BY MOUTH AT BEDTIME   [DISCONTINUED] molnupiravir EUA (LAGEVRIO) 200 mg CAPS capsule Take 4 capsules (800 mg total) by mouth 2 (two) times daily for 5 days.    Review of Systems  Constitutional:  Positive for fatigue. Negative for chills and fever.  HENT:  Positive for congestion, postnasal drip, sinus pressure, sinus pain and sore throat. Negative for ear pain.   Respiratory:  Positive for cough. Negative for shortness of breath and wheezing.   Cardiovascular:  Negative for chest pain.   Objective:  There were no vitals taken for this visit.      BP Readings from Last 3 Encounters:  04/10/21 (!) 165/95  01/25/21 131/64  12/05/20 114/72   Wt Readings from Last 3 Encounters:  04/10/21  (!) 421 lb 1.3 oz (191 kg)  01/25/21 (!) 421 lb (191 kg)  12/05/20 (!) 414 lb 4 oz (187.9 kg)    EXAM:  GENERAL: alert, oriented, appears well and in no acute distress  HEENT: sneezing, congestion.  LUNGS: intermittent cough PSYCH/NEURO: pleasant and cooperative, no obvious depression or anxiety, speech and thought processing grossly intact   Assessment/Plan 1. COVID Newly positive high risk patient. Will treat with antiviral therapy lagevrio. Call if any worsening of symptoms or if not starting to feel better after completing treatment.   2. Chronic obstructive pulmonary disease, unspecified COPD type ([place]) Has not used inhalers in some time, but discussed keeping on hand if needed with acute illness.  - albuterol (VENTOLIN HFA) 108 (90 Base) MCG/ACT inhaler; Inhale 2 puffs into the lungs every 6 (six) hours as needed for wheezing or shortness of breath.  Dispense: 8 g; Refill: 0 - Spacer/Aero-Holding Chambers DEVI; 1 Device by Does not apply route daily as needed.  Dispense: 1 each; Refill: 0     I discussed the assessment and treatment plan with the patient. The patient was provided an opportunity to ask questions and all were answered. The patient agreed with the plan and demonstrated an understanding of the instructions.   The patient was advised to call back or seek an in-person evaluation if the symptoms worsen or if the condition fails to improve as anticipated.  I provided 20 minutes of face-to-face time during this encounter.   Micheline Rough, MD

## 2021-06-19 DIAGNOSIS — I5040 Unspecified combined systolic (congestive) and diastolic (congestive) heart failure: Secondary | ICD-10-CM

## 2021-06-19 DIAGNOSIS — I10 Essential (primary) hypertension: Secondary | ICD-10-CM | POA: Diagnosis not present

## 2021-07-19 ENCOUNTER — Other Ambulatory Visit: Payer: Self-pay | Admitting: Cardiology

## 2021-07-20 ENCOUNTER — Other Ambulatory Visit: Payer: Self-pay | Admitting: Internal Medicine

## 2021-07-20 DIAGNOSIS — G4700 Insomnia, unspecified: Secondary | ICD-10-CM

## 2021-08-24 ENCOUNTER — Telehealth: Payer: Self-pay | Admitting: Pharmacist

## 2021-08-24 NOTE — Chronic Care Management (AMB) (Signed)
? ? ?Chronic Care Management ?Pharmacy Assistant  ? ?Name: Derrick Mata  MRN: [id number] DOB: 1950/11/06 ? ?Reason for Encounter: Disease State Hypertension Assessment ?  ?Conditions to be addressed/monitored: ?HTN ? ?Recent office visits:  ?06/15/21 Caren Macadam, MD - Patient presented for COVID and other concerns. Prescribed Albuterol, Prescribed Chlorpheniramine, Prescribed Molnupiravir, Stopped Diclofenac. ? ?Recent consult visits:  ?None ? ?Hospital visits:  ?Medication Reconciliation was completed by comparing discharge summary, patient?s EMR and Pharmacy list, and upon discussion with patient. ? ?Patient presented to [facility] ED on 04/10/21 due to Pain in dental caries. Patient was present for 1 day. ? ?New?Medications Started at [facility] Discharge:?? ?-started  ?clindamycin 300 MG ?oxyCODONE 5 MG ? ?Medication Changes at Hospital Discharge: ?-Changed  ?none ? ?Medications Discontinued at Hospital Discharge: ?-Stopped  ?none ? ?Medications that remain the same after Hospital Discharge:??  ?-All other medications will remain the same.   ? ?Medications: ?Outpatient Encounter Medications as of 08/24/2021  ?Medication Sig  ? albuterol (VENTOLIN HFA) 108 (90 Base) MCG/ACT inhaler Inhale 2 puffs into the lungs every 6 (six) hours as needed for wheezing or shortness of breath.  ? aspirin EC 81 MG tablet Take 81 mg by mouth at bedtime.  ? Blood Pressure Monitoring (ADULT BLOOD PRESSURE CUFF LG) KIT Use daily to monitor blood pressure  ? buPROPion (WELLBUTRIN XL) 150 MG 24 hr tablet TAKE 1 TABLET(150 MG) BY MOUTH DAILY  ? Chlorpheniramine-DM (CORICIDIN COUGH/COLD) 4-30 MG TABS Take per manufacturers instructions.  ? furosemide (LASIX) 40 MG tablet TAKE 1 TABLET(40 MG) BY MOUTH DAILY  ? metoprolol succinate (TOPROL-XL) 25 MG 24 hr tablet TAKE 1 TABLET BY MOUTH DAILY. SCHEDULE OFFICE VISIT FOR FUTURE REFILLS  ? Multiple Vitamin (MULTIVITAMIN WITH MINERALS) TABS tablet Take 1 tablet by mouth daily.  ?  omeprazole (PRILOSEC) 20 MG capsule Take 1 capsule (20 mg total) by mouth daily.  ? rosuvastatin (CRESTOR) 40 MG tablet Take 1 tablet (40 mg total) by mouth daily.  ? sacubitril-valsartan (ENTRESTO) 24-26 MG Take 1 tablet by mouth 2 (two) times daily.  ? Spacer/Aero-Holding Chambers DEVI 1 Device by Does not apply route daily as needed.  ? spironolactone (ALDACTONE) 25 MG tablet Take 1 tablet (25 mg total) by mouth daily.  ? traZODone (DESYREL) 150 MG tablet TAKE 1 TABLET(150 MG) BY MOUTH AT BEDTIME  ? ?No facility-administered encounter medications on file as of 08/24/2021.  ?Reviewed chart prior to disease state call. Spoke with patient regarding BP ? ?Recent Office Vitals: ?BP Readings from Last 3 Encounters:  ?04/10/21 (!) 165/95  ?01/25/21 131/64  ?12/05/20 114/72  ? ?Pulse Readings from Last 3 Encounters:  ?04/10/21 66  ?01/25/21 75  ?12/05/20 72  ?  ?Wt Readings from Last 3 Encounters:  ?04/10/21 (!) 421 lb 1.3 oz (191 kg)  ?01/25/21 (!) 421 lb (191 kg)  ?12/05/20 (!) 414 lb 4 oz (187.9 kg)  ?  ? ?Kidney Function ?Lab Results  ?Component Value Date/Time  ? CREATININE 0.88 10/27/2020 11:05 AM  ? CREATININE 0.85 07/19/2020 12:04 PM  ? CREATININE 0.94 10/03/2016 09:59 AM  ? CREATININE 0.81 10/18/2015 10:37 AM  ? GFR 86.33 10/27/2020 11:05 AM  ? GFRNONAA 78 07/04/2020 11:59 AM  ? GFRNONAA 84 10/03/2016 09:59 AM  ? GFRAA 90 07/04/2020 11:59 AM  ? GFRAA >89 10/03/2016 09:59 AM  ? ? ? ?  Latest Ref Rng & Units 10/27/2020  ? 11:05 AM 07/19/2020  ? 12:04 PM 07/04/2020  ? 11:59 AM  ?  BMP  ?Glucose 70 - 99 mg/dL 85   86   75    ?BUN 6 - 23 mg/dL 11   11   10     ?Creatinine 0.40 - 1.50 mg/dL 0.88   0.85   0.97    ?BUN/Creat Ratio 10 - 24  13   10     ?Sodium 135 - 145 mEq/L 141   138   138    ?Potassium 3.5 - 5.1 mEq/L 4.9   4.6   5.3    ?Chloride 96 - 112 mEq/L 105   100   100    ?CO2 19 - 32 mEq/L 28   23   22     ?Calcium 8.4 - 10.5 mg/dL 9.1   9.2   9.0    ? ? ?Current antihypertensive regimen:  ?-Current treatment: ?Entresto  24-26 mg 1 tablet twice daily - Appropriate, Effective, Safe, Accessible ?Spironolactone 25 mg 1 tablet daily - Appropriate, Effective, Safe, Accessible ?Metoprolol succinate 25 mg 1 tablet in the evening - Appropriate, Effective, Safe, Accessible ?Furosemide 40 mg 1 tablet daily - Appropriate, Effective, Safe, Accessible ?How often are you checking your Blood Pressure? weekly ?Current home BP readings: 138/80 Patient  reports he has been using his wife's cuff to monitor blood pressure readings have been staying around the above. ?What recent interventions/DTPs have been made by any provider to improve Blood Pressure control since last CPP Visit: Patient reports none ?Any recent hospitalizations or ED visits since last visit with CPP? No ?What diet changes have been made to improve Blood Pressure Control?  ?Patient reports his wife prepares his meals and he is not adding any salts ever, they eat mostly at home ?What exercise is being done to improve your Blood Pressure Control?  ?Patient reports he has been getting around well still drives. ?Adherence Review: ?Is the patient currently on ACE/ARB medication? Yes ?Does the patient have >5 day gap between last estimated fill dates? No ?Notes: ?Patient reports he had gone to [facility] for his COVID Vaccine and was told he was up to date on his vaccines and did not need any more than the 4 he had. ? ?Care Gaps: ?BP (167/70 Hosp 01/25/21) ?Flu Vaccine - Done 03/06/21 [facility] ?COVID Booster Therapist, music) Done 03/06/21 [facility] ?AWV-6/22 ?CCM-1/23 ? ?Star Rating Drugs: ?Sacubitril-Valsartan (Entresto) 24-26 MG - Last filled 06/01/21 90 DS at [facility] (PAP in process) ?Rosuvastatin (Crestor) 40 mg - Last filled 08/21/21 90 DS at [facility] ?Unable to get though to verify the Crestor ? ? ?Ned Clines CMA ?Clinical Pharmacist Assistant ?[phone] ? ?

## 2021-08-29 ENCOUNTER — Ambulatory Visit (INDEPENDENT_AMBULATORY_CARE_PROVIDER_SITE_OTHER): Payer: Medicare Other

## 2021-08-29 ENCOUNTER — Ambulatory Visit (HOSPITAL_COMMUNITY)
Admission: EM | Admit: 2021-08-29 | Discharge: 2021-08-29 | Disposition: A | Discharge status: Home / Self Care | Payer: Medicare Other | Attending: Physician Assistant | Admitting: Physician Assistant

## 2021-08-29 ENCOUNTER — Encounter (HOSPITAL_COMMUNITY): Payer: Self-pay

## 2021-08-29 DIAGNOSIS — Z23 Encounter for immunization: Secondary | ICD-10-CM

## 2021-08-29 DIAGNOSIS — M19011 Primary osteoarthritis, right shoulder: Secondary | ICD-10-CM | POA: Diagnosis not present

## 2021-08-29 DIAGNOSIS — R0789 Other chest pain: Secondary | ICD-10-CM | POA: Diagnosis not present

## 2021-08-29 DIAGNOSIS — T148XXA Other injury of unspecified body region, initial encounter: Secondary | ICD-10-CM

## 2021-08-29 DIAGNOSIS — R0781 Pleurodynia: Secondary | ICD-10-CM | POA: Diagnosis not present

## 2021-08-29 DIAGNOSIS — W19XXXA Unspecified fall, initial encounter: Secondary | ICD-10-CM

## 2021-08-29 DIAGNOSIS — R9389 Abnormal findings on diagnostic imaging of other specified body structures: Secondary | ICD-10-CM | POA: Diagnosis not present

## 2021-08-29 DIAGNOSIS — R918 Other nonspecific abnormal finding of lung field: Secondary | ICD-10-CM | POA: Diagnosis not present

## 2021-08-29 MED ORDER — TETANUS-DIPHTH-ACELL PERTUSSIS 5-2.5-18.5 LF-MCG/0.5 IM SUSY
0.5000 mL | PREFILLED_SYRINGE | Freq: Once | INTRAMUSCULAR | Status: AC
  Administered 2021-08-29: 0.5 mL via INTRAMUSCULAR

## 2021-08-29 MED ORDER — HYDROCODONE-ACETAMINOPHEN 5-325 MG PO TABS: 1.0000 | ORAL_TABLET | Freq: Two times a day (BID) | ORAL | 0 refills | Status: AC | PRN

## 2021-08-29 MED ORDER — BACITRACIN ZINC 500 UNIT/GM EX OINT
TOPICAL_OINTMENT | CUTANEOUS | Status: AC
  Filled 2021-08-29: qty 0.9

## 2021-08-29 MED ORDER — TETANUS-DIPHTH-ACELL PERTUSSIS 5-2.5-18.5 LF-MCG/0.5 IM SUSY
PREFILLED_SYRINGE | INTRAMUSCULAR | Status: AC
  Filled 2021-08-29: qty 0.5

## 2021-08-29 NOTE — Discharge Instructions (Signed)
Your x-ray did not show any fracture.  I would recommend that you have this repeated in 10 to 14 days if your symptoms are not improving.  Use heat on this area.  I have called in a couple doses of hydrocodone.  He can use this for pain relief.  This will make you sleepy so do not drive or drink alcohol with taking it.  Your chest x-ray was abnormal and I recommend you follow-up with your primary care as you may need to have a repeat CT.  Use incentive spirometer as we discussed to encourage deep breathing.  Keep your wounds clean and apply over-the-counter antibacterial ointment.  We updated your tetanus today.  If you have any signs of infection please return.  If you have any worsening symptoms including shortness of breath, increased pain, nausea, vomiting, weakness you need to go to the emergency room. ?

## 2021-08-29 NOTE — ED Provider Notes (Signed)
?[facility] ? ? ? ?CSN: [id number] ?Arrival date & time: 08/29/21  1625 ? ? ?  ? ?History   ?Chief Complaint ?Chief Complaint  ?Patient presents with  ? Rib Injury  ?  right  ? ? ?HPI ?Kitai Purdom is a 71 y.o. male.  ? ?Patient presents today with a several hour history of right rib pain following injury.  Reports that he lost his balance and fell with the majority of his weight hitting his right ribs on a metal pole as he was going into a government building earlier today.  He reports that pain is severe and rated 9.5 on a 0-10 pain scale, described as sharp, worse with deep breathing or coughing, no aggravating relieving factors identified.  He has not tried any over-the-counter medications for symptom management.  He denies any head injury, loss of consciousness, nausea, vomiting, headache, dizziness, amnesia surrounding event.  He did sustain several abrasions on his right hand but reports that he cleaned this with soap and water.  He is unsure when his last tetanus was and there is nothing in the EMR. ? ? ?Past Medical History:  ?Diagnosis Date  ? Arthritis   ? OA of knees  ? CHF (congestive heart failure) ([place])   ? Closed compression fracture of L1 lumbar vertebra 09/03/2016  ? Colon polyp   ? Complication of anesthesia   ? slow to wake up  ? COPD (chronic obstructive pulmonary disease) (HCC)   ? > 30 pack yr smoker  ? Depression   ? Anxiety and Insomnia  ? Diverticulosis   ? Erectile dysfunction   ? GERD (gastroesophageal reflux disease)   ? Hemorrhoids   ? Hep C w/o coma, chronic ([place]) 06/12/2015  ? treated with medications  ? Leg edema   ? Morbid obesity ([place])   ? Sleep apnea   ? refuses CPAP  ? Substance abuse ([place])   ? hx alcohol and cocaine use, 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, 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  ? ? ?Patient Active Problem List  ? Diagnosis Date Noted  ? Benign neoplasm of ascending colon   ? Portal hypertension (HCC)   ? Portal hypertensive gastropathy (HCC)   ? Esophageal ring    ? IGT (impaired glucose tolerance) 12/09/2018  ? History of colonic polyps   ? Recurrent major depressive disorder, in full remission ([place]) 07/07/2017  ? Aortic atherosclerosis ([place]) 07/02/2017  ? Frequent headaches 01/03/2017  ? Benign neoplasm of descending colon   ? Benign neoplasm of rectum   ? Hyperglycemia 02/01/2016  ? Hepatic cirrhosis ([place]) 12/19/2015  ? Super obesity 07/18/2015  ? Obesity hypoventilation syndrome ([place]) 07/18/2015  ? CHF (congestive heart failure), NYHA class I ([place]) 07/18/2015  ? H/O: substance abuse ([place]) 07/18/2015  ? OSA and COPD overlap syndrome ([place]) 07/18/2015  ? Noncompliance with CPAP treatment 07/18/2015  ? Moderate smoker (20 or less per day) 06/19/2015  ? Hep C w/o coma, chronic ([place]) 06/12/2015  ? ERECTILE DYSFUNCTION 01/31/2010  ? KNEE PAIN, LEFT, CHRONIC 05/18/2009  ? TOBACCO USE 06/23/2007  ? Essential hypertension 11/24/2006  ? Osteoarthritis 11/24/2006  ? OSA (obstructive sleep apnea) 11/24/2006  ? ? ?Past Surgical History:  ?Procedure Laterality Date  ? COLONOSCOPY  04/18/2011  ? Procedure: COLONOSCOPY;  Surgeon: Scarlette Shorts, MD;  [facility];  Service: Endoscopy;  Laterality: N/A;  ? COLONOSCOPY WITH PROPOFOL N/A 05/07/2016  ? Procedure: COLONOSCOPY WITH PROPOFOL;  Surgeon: Irene Shipper, MD;  Location:  [facility];  Service: Endoscopy;  Laterality: N/A;  ? COLONOSCOPY WITH PROPOFOL N/A 08/12/2017  ? Procedure: COLONOSCOPY WITH PROPOFOL;  Surgeon: Irene Shipper, MD;  [facility];  Service: Endoscopy;  Laterality: N/A;  ? COLONOSCOPY WITH PROPOFOL N/A 01/25/2021  ? Procedure: COLONOSCOPY WITH PROPOFOL;  Surgeon: Irene Shipper, MD;  [facility];  Service: Endoscopy;  Laterality: N/A;  ? ESOPHAGOGASTRODUODENOSCOPY (EGD) WITH PROPOFOL N/A 05/07/2016  ? Procedure: ESOPHAGOGASTRODUODENOSCOPY (EGD) WITH PROPOFOL;  Surgeon: Irene Shipper, MD;  [facility];  Service: Endoscopy;  Laterality: N/A;  ? ESOPHAGOGASTRODUODENOSCOPY (EGD) WITH  PROPOFOL N/A 01/25/2021  ? Procedure: ESOPHAGOGASTRODUODENOSCOPY (EGD) WITH PROPOFOL;  Surgeon: Irene Shipper, MD;  [facility];  Service: Endoscopy;  Laterality: N/A;  ? KNEE ARTHROSCOPY  2011  ? left  ? [place], 2010  ? POLYPECTOMY  01/25/2021  ? Procedure: POLYPECTOMY;  Surgeon: Irene Shipper, MD;  [facility]: [facility];  Service: Endoscopy;;  ? [place]  2006  ? hardware  ? TONSILLECTOMY  1963  ? ? ? ? ? ?Home Medications   ? ?Prior to Admission medications   ?Medication Sig Start Date End Date Taking? Authorizing Provider  ?HYDROcodone-acetaminophen (NORCO/VICODIN) 5-325 MG tablet Take 1 tablet by mouth 2 (two) times daily as needed for up to 3 days. 08/29/21 09/01/21 Yes Anjelo Pullman, Derry Skill, PA-C  ?albuterol (VENTOLIN HFA) 108 (90 Base) MCG/ACT inhaler Inhale 2 puffs into the lungs every 6 (six) hours as needed for wheezing or shortness of breath. 06/15/21   Caren Macadam, MD  ?aspirin EC 81 MG tablet Take 81 mg by mouth at bedtime.    [provider]  ?Blood Pressure Monitoring (ADULT BLOOD PRESSURE CUFF LG) KIT Use daily to monitor blood pressure 03/15/21   Isaac Bliss, Rayford Halsted, MD  ?buPROPion (WELLBUTRIN XL) 150 MG 24 hr tablet TAKE 1 TABLET(150 MG) BY MOUTH DAILY 04/25/21   Isaac Bliss, Rayford Halsted, MD  ?Chlorpheniramine-DM (CORICIDIN COUGH/COLD) 4-30 MG TABS Take per manufacturers instructions. 06/15/21   Caren Macadam, MD  ?furosemide (LASIX) 40 MG tablet TAKE 1 TABLET(40 MG) BY MOUTH DAILY 04/24/21   Isaac Bliss, Rayford Halsted, MD  ?metoprolol succinate (TOPROL-XL) 25 MG 24 hr tablet TAKE 1 TABLET BY MOUTH DAILY. SCHEDULE OFFICE VISIT FOR FUTURE REFILLS 07/19/21   Lelon Perla, MD  ?Multiple Vitamin (MULTIVITAMIN WITH MINERALS) TABS tablet Take 1 tablet by mouth daily.    [provider]  ?omeprazole (PRILOSEC) 20 MG capsule Take 1 capsule (20 mg total) by mouth daily. 03/15/21   Isaac Bliss, Rayford Halsted, MD  ?rosuvastatin (CRESTOR)  40 MG tablet Take 1 tablet (40 mg total) by mouth daily. 01/30/21 06/13/21  Lelon Perla, MD  ?sacubitril-valsartan (ENTRESTO) 24-26 MG Take 1 tablet by mouth 2 (two) times daily. 08/01/20   Lelon Perla, MD  ?Spacer/Aero-Holding Chambers DEVI 1 Device by Does not apply route daily as needed. 06/15/21   Caren Macadam, MD  ?spironolactone (ALDACTONE) 25 MG tablet Take 1 tablet (25 mg total) by mouth daily. 07/04/20 11/19/21  Lelon Perla, MD  ?traZODone (DESYREL) 150 MG tablet TAKE 1 TABLET(150 MG) BY MOUTH AT BEDTIME 07/23/21   Isaac Bliss, Rayford Halsted, MD  ? ? ?Family History ?Family History  ?Problem Relation Age of Onset  ? Breast cancer Mother   ? Heart disease Father 39  ? Colon cancer Sister   ? Cancer Sister 45  ?     sister,  colon   ? Heart disease Other   ? Stomach cancer Neg Hx   ? Rectal cancer Neg Hx   ? Esophageal cancer Neg Hx   ? Pancreatic cancer Neg Hx   ? ? ?Social History ?Social History  ? ?Tobacco Use  ? Smoking status: Every Day  ?  Packs/day: 1.00  ?  Years: 51.00  ?  Pack years: 51.00  ?  Types: Cigarettes  ?  Start date: 05/21/1963  ? Smokeless tobacco: Never  ? Tobacco comments:  ?  not ready to quit yet  ?Vaping Use  ? Vaping Use: Never used  ?Substance Use Topics  ? Alcohol use: No  ?  Alcohol/week: 0.0 standard drinks  ? Drug use: No  ?  Comment: last dose November 2012  ? ? ? ?Allergies   ?Penicillins and Sulfamethoxazole ? ? ?Review of Systems ?Review of Systems  ?Constitutional:  Positive for activity change. Negative for appetite change, fatigue and fever.  ?Respiratory:  Negative for cough and shortness of breath.   ?Cardiovascular:  Positive for chest pain (Right chest wall).  ?Gastrointestinal:  Negative for abdominal pain, diarrhea, nausea and vomiting.  ?Skin:  Positive for wound.  ?Neurological:  Negative for dizziness, weakness, light-headedness, numbness and headaches.  ? ? ?Physical Exam ?Triage Vital Signs ?ED Triage Vitals  ?Enc Vitals Group  ?   BP 08/29/21  1835 134/85  ?   Pulse Rate 08/29/21 1835 63  ?   Resp 08/29/21 1835 17  ?   Temp 08/29/21 1835 97.8 ?F (36.6 ?C)  ?   Temp Source 08/29/21 1835 Oral  ?   SpO2 08/29/21 1835 95 %  ?   Weight --   ?   [place]

## 2021-08-29 NOTE — ED Triage Notes (Signed)
Today, while walking into a building, Pt fell, landing on his right side causing a sudden onset of right sided rib pain. Coughing aggravates sxs. No LOC. ?

## 2021-10-03 ENCOUNTER — Other Ambulatory Visit: Payer: Self-pay

## 2021-10-03 DIAGNOSIS — I5040 Unspecified combined systolic (congestive) and diastolic (congestive) heart failure: Secondary | ICD-10-CM

## 2021-10-03 MED ORDER — ENTRESTO 24-26 MG PO TABS: 1.0000 | ORAL_TABLET | Freq: Two times a day (BID) | ORAL | 0 refills | Status: DC

## 2021-10-09 ENCOUNTER — Other Ambulatory Visit: Payer: Self-pay | Admitting: Internal Medicine

## 2021-10-17 ENCOUNTER — Other Ambulatory Visit: Payer: Self-pay | Admitting: Internal Medicine

## 2021-10-23 ENCOUNTER — Other Ambulatory Visit: Payer: Self-pay | Admitting: Internal Medicine

## 2021-10-23 DIAGNOSIS — G4700 Insomnia, unspecified: Secondary | ICD-10-CM

## 2021-11-01 ENCOUNTER — Encounter: Payer: Self-pay | Admitting: Internal Medicine

## 2021-11-01 ENCOUNTER — Ambulatory Visit (INDEPENDENT_AMBULATORY_CARE_PROVIDER_SITE_OTHER): Payer: Medicare Other | Admitting: Internal Medicine

## 2021-11-01 VITALS — BP 120/70 | HR 70 | Temp 97.8°F | Ht 70.5 in | Wt >= 6400 oz

## 2021-11-01 DIAGNOSIS — R7302 Impaired glucose tolerance (oral): Secondary | ICD-10-CM | POA: Diagnosis not present

## 2021-11-01 DIAGNOSIS — J449 Chronic obstructive pulmonary disease, unspecified: Secondary | ICD-10-CM

## 2021-11-01 DIAGNOSIS — E662 Morbid (severe) obesity with alveolar hypoventilation: Secondary | ICD-10-CM

## 2021-11-01 DIAGNOSIS — Z125 Encounter for screening for malignant neoplasm of prostate: Secondary | ICD-10-CM | POA: Diagnosis not present

## 2021-11-01 DIAGNOSIS — G4733 Obstructive sleep apnea (adult) (pediatric): Secondary | ICD-10-CM | POA: Diagnosis not present

## 2021-11-01 DIAGNOSIS — K7460 Unspecified cirrhosis of liver: Secondary | ICD-10-CM | POA: Diagnosis not present

## 2021-11-01 DIAGNOSIS — I10 Essential (primary) hypertension: Secondary | ICD-10-CM | POA: Diagnosis not present

## 2021-11-01 DIAGNOSIS — I5040 Unspecified combined systolic (congestive) and diastolic (congestive) heart failure: Secondary | ICD-10-CM | POA: Diagnosis not present

## 2021-11-01 DIAGNOSIS — F17200 Nicotine dependence, unspecified, uncomplicated: Secondary | ICD-10-CM

## 2021-11-01 DIAGNOSIS — Z0000 Encounter for general adult medical examination without abnormal findings: Secondary | ICD-10-CM | POA: Diagnosis not present

## 2021-11-01 DIAGNOSIS — H539 Unspecified visual disturbance: Secondary | ICD-10-CM | POA: Diagnosis not present

## 2021-11-01 DIAGNOSIS — E669 Obesity, unspecified: Secondary | ICD-10-CM | POA: Diagnosis not present

## 2021-11-01 LAB — VITAMIN D 25 HYDROXY (VIT D DEFICIENCY, FRACTURES): VITD: 52.55 ng/mL (ref 30.00–100.00)

## 2021-11-01 LAB — CBC WITH DIFFERENTIAL/PLATELET
Basophils Absolute: 0 10*3/uL (ref 0.0–0.1)
Basophils Relative: 0.2 % (ref 0.0–3.0)
Eosinophils Absolute: 0.1 10*3/uL (ref 0.0–0.7)
Eosinophils Relative: 1 % (ref 0.0–5.0)
HCT: 42.7 % (ref 39.0–52.0)
Hemoglobin: 14 g/dL (ref 13.0–17.0)
Lymphocytes Relative: 17.4 % (ref 12.0–46.0)
Lymphs Abs: 1.6 10*3/uL (ref 0.7–4.0)
MCHC: 32.7 g/dL (ref 30.0–36.0)
MCV: 84.1 fl (ref 78.0–100.0)
Monocytes Absolute: 0.5 10*3/uL (ref 0.1–1.0)
Monocytes Relative: 5.7 % (ref 3.0–12.0)
Neutro Abs: 6.7 10*3/uL (ref 1.4–7.7)
Neutrophils Relative %: 75.7 % (ref 43.0–77.0)
Platelets: 284 10*3/uL (ref 150.0–400.0)
RBC: 5.07 Mil/uL (ref 4.22–5.81)
RDW: 14.4 % (ref 11.5–15.5)
WBC: 8.9 10*3/uL (ref 4.0–10.5)

## 2021-11-01 LAB — COMPREHENSIVE METABOLIC PANEL
ALT: 3 U/L (ref 0–53)
AST: 8 U/L (ref 0–37)
Albumin: 3.5 g/dL (ref 3.5–5.2)
Alkaline Phosphatase: 50 U/L (ref 39–117)
BUN: 10 mg/dL (ref 6–23)
CO2: 27 mEq/L (ref 19–32)
Calcium: 8.7 mg/dL (ref 8.4–10.5)
Chloride: 104 mEq/L (ref 96–112)
Creatinine, Ser: 0.84 mg/dL (ref 0.40–1.50)
GFR: 86.93 mL/min (ref >60.00)
Glucose, Bld: 87 mg/dL (ref 70–99)
Potassium: 4.3 mEq/L (ref 3.5–5.1)
Sodium: 138 mEq/L (ref 135–145)
Total Bilirubin: 0.4 mg/dL (ref 0.2–1.2)
Total Protein: 6.7 g/dL (ref 6.0–8.3)

## 2021-11-01 LAB — HEMOGLOBIN A1C: Hgb A1c MFr Bld: 6.1 % (ref 4.6–6.5)

## 2021-11-01 LAB — LIPID PANEL
Cholesterol: 107 mg/dL (ref 0–200)
HDL: 34.3 mg/dL — ABNORMAL LOW (ref >39.00)
LDL Cholesterol: 59 mg/dL (ref 0–99)
NonHDL: 72.83
Total CHOL/HDL Ratio: 3
Triglycerides: 68 mg/dL (ref 0.0–149.0)
VLDL: 13.6 mg/dL (ref 0.0–40.0)

## 2021-11-01 LAB — VITAMIN B12: Vitamin B-12: 432 pg/mL (ref 211–911)

## 2021-11-01 LAB — PSA: PSA: 1.34 ng/mL (ref 0.10–4.00)

## 2021-11-01 LAB — TSH: TSH: 1.77 u[IU]/mL (ref 0.35–5.50)

## 2021-11-01 NOTE — Patient Instructions (Signed)
-  Nice seeing you today!!  -Lab work today; will notify you once results are available.  -Schedule follow up in 6 months. 

## 2021-11-01 NOTE — Addendum Note (Signed)
Addended by: Westley Hummer B on: 11/01/2021 03:37 PM   Modules accepted: Orders

## 2021-11-01 NOTE — Progress Notes (Signed)
Established Patient Office Visit     CC/Reason for Visit: Annual preventive exam and subsequent Medicare wellness visit  HPI: Derrick Mata is a 71 y.o. male who is coming in today for the above mentioned reasons. Past Medical History is significant for:   1. Chronic combined CHF.  Followed by Dr. Stanford Breed, most recent medication changes include addition of Entresto.   2. Morbid Obesity with a BMI of 56.82.    3. OSA, refuses CPAP use.   4. Depression well controlled on wellbutrin.   5. GERD no symptoms on omeprazole.   6. Hep C Cirrhosis, completed treatment with [facility]. Gi is Dr. [name].   7. Ongoing tobacco abuse of at least 1 PPD if not more. He states he chain smokes from 8 am till about 8 pm every day.   8.  Impaired glucose tolerance.  He is overdue for eye and dental care, no perceived hearing issues, does not exercise, all immunizations are up-to-date.  He had a colonoscopy in 2022.   Past Medical/Surgical History: Past Medical History:  Diagnosis Date   Arthritis    OA of knees   CHF (congestive heart failure) (HCC)    Closed compression fracture of L1 lumbar vertebra 09/03/2016   Colon polyp    Complication of anesthesia    slow to wake up   COPD (chronic obstructive pulmonary disease) (HCC)    > 30 pack yr smoker   Depression    Anxiety and Insomnia   Diverticulosis    Erectile dysfunction    GERD (gastroesophageal reflux disease)    Hemorrhoids    Hep C w/o coma, chronic ([place]) 06/12/2015   treated with medications   Leg edema    Morbid obesity ([place])    Sleep apnea    refuses CPAP   Substance abuse ([place])    hx alcohol and cocaine use, 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, 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    Past Surgical History:  Procedure Laterality Date   COLONOSCOPY  04/18/2011   Procedure: COLONOSCOPY;  Surgeon: Scarlette Shorts, MD;  [facility];  Service: Endoscopy;  Laterality: N/A;   COLONOSCOPY WITH PROPOFOL N/A 05/07/2016    Procedure: COLONOSCOPY WITH PROPOFOL;  Surgeon: Irene Shipper, MD;  [facility];  Service: Endoscopy;  Laterality: N/A;   COLONOSCOPY WITH PROPOFOL N/A 08/12/2017   Procedure: COLONOSCOPY WITH PROPOFOL;  Surgeon: Irene Shipper, MD;  [facility];  Service: Endoscopy;  Laterality: N/A;   COLONOSCOPY WITH PROPOFOL N/A 01/25/2021   Procedure: COLONOSCOPY WITH PROPOFOL;  Surgeon: Irene Shipper, MD;  [facility];  Service: Endoscopy;  Laterality: N/A;   ESOPHAGOGASTRODUODENOSCOPY (EGD) WITH PROPOFOL N/A 05/07/2016   Procedure: ESOPHAGOGASTRODUODENOSCOPY (EGD) WITH PROPOFOL;  Surgeon: Irene Shipper, MD;  [facility];  Service: Endoscopy;  Laterality: N/A;   ESOPHAGOGASTRODUODENOSCOPY (EGD) WITH PROPOFOL N/A 01/25/2021   Procedure: ESOPHAGOGASTRODUODENOSCOPY (EGD) WITH PROPOFOL;  Surgeon: Irene Shipper, MD;  [facility];  Service: Endoscopy;  Laterality: N/A;   KNEE ARTHROSCOPY  2011   left   LUMBAR LAMINECTOMY  1994, 2010   POLYPECTOMY  01/25/2021   Procedure: POLYPECTOMY;  Surgeon: Irene Shipper, MD;  [facility];  Service: Endoscopy;;   [place]  2006   hardware   TONSILLECTOMY  1963    Social History:  reports that he has been smoking cigarettes. He started smoking about 58 years ago. He has a 51.00 pack-year smoking history. He  has never used smokeless tobacco. He reports that he does not drink alcohol and does not use drugs.  Allergies: Allergies  Allergen Reactions   Penicillins Anaphylaxis, Nausea Only and Other (See Comments)    Passed out. Has patient had a PCN reaction causing immediate rash, facial/tongue/throat swelling, SOB or lightheadedness with hypotension: {no Has patient had a PCN reaction causing severe rash involving mucus membranes or skin necrosis: {no Has patient had a PCN reaction that required hospitalization {no Has patient had a PCN reaction occurring within the last 10 years: {no If all of the  above answers are "NO", then may proceed with Cephalosporin use.   Sulfamethoxazole Hives    Family History:  Family History  Problem Relation Age of Onset   Breast cancer Mother    Heart disease Father 68   Colon cancer Sister    Cancer Sister 81       sister, colon    Heart disease Other    Stomach cancer Neg Hx    Rectal cancer Neg Hx    Esophageal cancer Neg Hx    Pancreatic cancer Neg Hx      Current Outpatient Medications:    albuterol (VENTOLIN HFA) 108 (90 Base) MCG/ACT inhaler, Inhale 2 puffs into the lungs every 6 (six) hours as needed for wheezing or shortness of breath., Disp: 8 g, Rfl: 0   aspirin EC 81 MG tablet, Take 81 mg by mouth at bedtime., Disp: , Rfl:    Blood Pressure Monitoring (ADULT BLOOD PRESSURE CUFF LG) KIT, Use daily to monitor blood pressure, Disp: 1 kit, Rfl: 0   buPROPion (WELLBUTRIN XL) 150 MG 24 hr tablet, TAKE 1 TABLET(150 MG) BY MOUTH DAILY, Disp: 90 tablet, Rfl: 1   Chlorpheniramine-DM (CORICIDIN COUGH/COLD) 4-30 MG TABS, Take per manufacturers instructions., Disp: 28 tablet, Rfl: 0   furosemide (LASIX) 40 MG tablet, Take 1 tablet (40 mg total) by mouth daily., Disp: 90 tablet, Rfl: 0   metoprolol succinate (TOPROL-XL) 25 MG 24 hr tablet, TAKE 1 TABLET BY MOUTH DAILY. SCHEDULE OFFICE VISIT FOR FUTURE REFILLS, Disp: 90 tablet, Rfl: 1   Multiple Vitamin (MULTIVITAMIN WITH MINERALS) TABS tablet, Take 1 tablet by mouth daily., Disp: , Rfl:    omeprazole (PRILOSEC) 20 MG capsule, TAKE 1 CAPSULE(20 MG) BY MOUTH DAILY, Disp: 90 capsule, Rfl: 0   sacubitril-valsartan (ENTRESTO) 24-26 MG, Take 1 tablet by mouth 2 (two) times daily. Appointment needed for continuation of refills-1st attempt, Disp: 60 tablet, Rfl: 0   Spacer/Aero-Holding Chambers DEVI, 1 Device by Does not apply route daily as needed., Disp: 1 each, Rfl: 0   spironolactone (ALDACTONE) 25 MG tablet, Take 1 tablet (25 mg total) by mouth daily., Disp: 90 tablet, Rfl: 3   traZODone (DESYREL) 150  MG tablet, TAKE 1 TABLET(150 MG) BY MOUTH AT BEDTIME, Disp: 90 tablet, Rfl: 0   rosuvastatin (CRESTOR) 40 MG tablet, Take 1 tablet (40 mg total) by mouth daily., Disp: 90 tablet, Rfl: 3  Review of Systems:  Constitutional: Denies fever, chills, diaphoresis, appetite change and fatigue.  HEENT: Denies photophobia, eye pain, redness, hearing loss, ear pain, congestion, sore throat, rhinorrhea, sneezing, mouth sores, trouble swallowing, neck pain, neck stiffness and tinnitus.   Respiratory: Denies SOB, DOE, cough, chest tightness,  and wheezing.   Cardiovascular: Denies chest pain, palpitations and leg swelling.  Gastrointestinal: Denies nausea, vomiting, abdominal pain, diarrhea, constipation, blood in stool and abdominal distention.  Genitourinary: Denies dysuria, urgency, frequency, hematuria, flank pain and difficulty urinating.  Endocrine: Denies: hot or cold intolerance, sweats, changes in hair or nails, polyuria, polydipsia. Musculoskeletal: Denies myalgias, back pain, joint swelling, arthralgias and gait problem.  Skin: Denies pallor, rash and wound.  Neurological: Denies dizziness, seizures, syncope, weakness, light-headedness, numbness and headaches.  Hematological: Denies adenopathy. Easy bruising, personal or family bleeding history  Psychiatric/Behavioral: Denies suicidal ideation, mood changes, confusion, nervousness, sleep disturbance and agitation    Physical Exam: Vitals:   11/01/21 1308  BP: 120/70  Pulse: 70  Temp: 97.8 F (36.6 C)  TempSrc: Oral  SpO2: 96%  Weight: (!) 401 lb 11.2 oz (182.2 kg)  Height: 5' 10.5" (1.791 m)    Body mass index is 56.82 kg/m.   Constitutional: NAD, calm, comfortable, morbidly obese Eyes: PERRL, lids and conjunctivae normal ENMT: Mucous membranes are moist. Posterior pharynx clear of any exudate or lesions. Normal dentition. Tympanic membrane is pearly white, no erythema or bulging. Neck: normal, supple, no masses, no  thyromegaly Respiratory: clear to auscultation bilaterally, no wheezing, no crackles. Normal respiratory effort. No accessory muscle use.  Cardiovascular: Regular rate and rhythm, no murmurs / rubs / gallops. No extremity edema. 2+ pedal pulses. No carotid bruits.  Abdomen: no tenderness, no masses palpated. No hepatosplenomegaly. Bowel sounds positive.  Musculoskeletal: no clubbing / cyanosis. No joint deformity upper and lower extremities. Good ROM, no contractures. Normal muscle tone.  Skin: no rashes, lesions, ulcers. No induration Neurologic: CN 2-12 grossly intact. Sensation intact, DTR normal. Strength 5/5 in all 4.  Psychiatric: Normal judgment and insight. Alert and oriented x 3. Normal mood.    Subsequent Medicare wellness visit   1. Risk factors, based on past  M,S,F -cardiovascular disease risk factors include age, gender, superobesity, known CHF, ongoing tobacco use   2.  Physical activities: Very sedentary   3.  Depression/mood: Stable, not depressed   4.  Hearing: No perceived issues   5.  ADL's: Independent in all ADLs   6.  Fall risk: High fall risk due to obesity and recent falls   7.  Home safety: No problems identified   8.  Height weight, and visual acuity: height and weight as above, vision:  Vision Screening   Right eye Left eye Both eyes  Without correction 20/100 20/63 20/50  With correction        9.  Counseling: Advised he update health maintenance items, increase physical activity and healthy lifestyle changes   10. Lab orders based on risk factors: Laboratory update will be reviewed   11. Referral : Ophthalmology   12. Care plan: Follow-up with me in 6 months   13. Cognitive assessment: No cognitive impairment   14. Screening: Patient provided with a written and personalized 5-10 year screening schedule in the AVS. yes   15. Provider List Update: PCP, cardiology  16. Advance Directives: Full code  17. Opioids: Patient is not on any opioid  prescriptions and has no risk factors for a substance use disorder.            [place] [facility] Office Visit from 10/27/2020 in [facility] at [facility]  PHQ-9 Total Score 1          12/08/2018   11:52 AM 10/27/2020   10:23 AM 01/25/2021    8:41 AM 04/10/2021    3:37 PM 08/29/2021    6:35 PM  Fall Risk  Falls in the past year? 0 0     Was there an injury with Fall? 0 0     Fall Risk  Category Calculator 0 0     Fall Risk Category Low Low     Patient Fall Risk Level   Moderate fall risk Low fall risk Low fall risk     Impression and Plan:  Encounter for preventive health examination  - Plan: PSA, TSH, Vitamin B12, VITAMIN D 25 Hydroxy (Vit-D Deficiency, Fractures) -Recommend routine eye and dental care. -Immunizations: All immunizations are up-to-date -Healthy lifestyle discussed in detail. -Labs to be updated today. -Colon cancer screening: 01/2021 -Breast cancer screening: Not applicable -Cervical cancer screening: Not applicable -Lung cancer screening: Low-dose CT scheduled for July -Prostate cancer screening: PSA today -DEXA: Not applicable  IGT (impaired glucose tolerance)  - Plan: Hemoglobin A1c  Cirrhosis of liver without ascites, unspecified hepatic cirrhosis type ([place]) -Noted, check LFTs  Super obesity -Discussed healthy lifestyle, including increased physical activity and better food choices to promote weight loss.  Obesity hypoventilation syndrome (HCC)  OSA and COPD overlap syndrome (HCC) -Declined CPAP use  TOBACCO USE -Continues to smoke, is not interested in discussing cessation  Essential hypertension  - Plan: CBC with Differential/Platelet, Comprehensive metabolic panel -Well-controlled  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, unspecified congestive heart failure chronicity ([place]) -Compensated    Patient Instructions  -Nice seeing you today!!  -Lab work today; will notify you once results are  available.  -Schedule follow up in 6 months.      Lelon Frohlich, MD [facility]

## 2021-11-12 ENCOUNTER — Other Ambulatory Visit: Payer: Self-pay | Admitting: Cardiology

## 2021-11-12 DIAGNOSIS — I5040 Unspecified combined systolic (congestive) and diastolic (congestive) heart failure: Secondary | ICD-10-CM

## 2021-11-13 ENCOUNTER — Encounter (INDEPENDENT_AMBULATORY_CARE_PROVIDER_SITE_OTHER): Payer: Self-pay | Admitting: Ophthalmology

## 2021-11-13 ENCOUNTER — Ambulatory Visit (INDEPENDENT_AMBULATORY_CARE_PROVIDER_SITE_OTHER): Payer: Medicare Other | Admitting: Ophthalmology

## 2021-11-13 DIAGNOSIS — H2513 Age-related nuclear cataract, bilateral: Secondary | ICD-10-CM | POA: Diagnosis not present

## 2021-11-13 DIAGNOSIS — H43392 Other vitreous opacities, left eye: Secondary | ICD-10-CM | POA: Diagnosis not present

## 2021-11-13 DIAGNOSIS — H35341 Macular cyst, hole, or pseudohole, right eye: Secondary | ICD-10-CM

## 2021-11-15 ENCOUNTER — Other Ambulatory Visit: Payer: Self-pay

## 2021-11-15 DIAGNOSIS — I5040 Unspecified combined systolic (congestive) and diastolic (congestive) heart failure: Secondary | ICD-10-CM

## 2021-11-15 MED ORDER — SPIRONOLACTONE 25 MG PO TABS: 25.0000 mg | ORAL_TABLET | Freq: Every day | ORAL | 1 refills | Status: DC

## 2021-12-11 ENCOUNTER — Telehealth: Payer: Self-pay | Admitting: Pharmacist

## 2021-12-11 NOTE — Chronic Care Management (AMB) (Signed)
    Chronic Care Management Pharmacy Assistant   Name: Derrick Mata  MRN: [id number] DOB: Feb 23, 1951   12/11/21 APPOINTMENT REMINDER    Patient was reminded to have all medications, supplements and any blood glucose and blood pressure readings available for review with [name], Pharm. D, for telephone visit on 12/12/21 at 3.    Care Gaps: COVID Booster - Overdue AWV- 11/01/21 BP- 120/70 11/01/21  Star Rating Drug: Sacubitril-Valsartan Delene Loll) 24-26 MG - Last filled 11/13/21 90 DS at [facility]  Rosuvastatin (Crestor) 40 mg - Last filled 11/22/21 90 DS at [facility]      Medications: Outpatient Encounter Medications as of 12/11/2021  Medication Sig   albuterol (VENTOLIN HFA) 108 (90 Base) MCG/ACT inhaler Inhale 2 puffs into the lungs every 6 (six) hours as needed for wheezing or shortness of breath.   aspirin EC 81 MG tablet Take 81 mg by mouth at bedtime.   Blood Pressure Monitoring (ADULT BLOOD PRESSURE CUFF LG) KIT Use daily to monitor blood pressure   buPROPion (WELLBUTRIN XL) 150 MG 24 hr tablet TAKE 1 TABLET(150 MG) BY MOUTH DAILY   Chlorpheniramine-DM (CORICIDIN COUGH/COLD) 4-30 MG TABS Take per manufacturers instructions.   furosemide (LASIX) 40 MG tablet Take 1 tablet (40 mg total) by mouth daily.   metoprolol succinate (TOPROL-XL) 25 MG 24 hr tablet TAKE 1 TABLET BY MOUTH DAILY. SCHEDULE OFFICE VISIT FOR FUTURE REFILLS   Multiple Vitamin (MULTIVITAMIN WITH MINERALS) TABS tablet Take 1 tablet by mouth daily.   omeprazole (PRILOSEC) 20 MG capsule TAKE 1 CAPSULE(20 MG) BY MOUTH DAILY   rosuvastatin (CRESTOR) 40 MG tablet Take 1 tablet (40 mg total) by mouth daily.   sacubitril-valsartan (ENTRESTO) 24-26 MG TAKE 1 TABLET BY MOUTH TWICE DAILY, NEED OV FOR MORE   Spacer/Aero-Holding Chambers DEVI 1 Device by Does not apply route daily as needed.   spironolactone (ALDACTONE) 25 MG tablet Take 1 tablet (25 mg total) by mouth daily.   traZODone (DESYREL) 150 MG tablet  TAKE 1 TABLET(150 MG) BY MOUTH AT BEDTIME   No facility-administered encounter medications on file as of 12/11/2021.    [place] Clinical Pharmacist Assistant [phone]

## 2021-12-12 ENCOUNTER — Ambulatory Visit (INDEPENDENT_AMBULATORY_CARE_PROVIDER_SITE_OTHER): Payer: Medicare Other | Admitting: Pharmacist

## 2021-12-12 ENCOUNTER — Telehealth: Payer: Self-pay | Admitting: *Deleted

## 2021-12-12 DIAGNOSIS — I5040 Unspecified combined systolic (congestive) and diastolic (congestive) heart failure: Secondary | ICD-10-CM

## 2021-12-12 DIAGNOSIS — I10 Essential (primary) hypertension: Secondary | ICD-10-CM

## 2021-12-12 DIAGNOSIS — F17200 Nicotine dependence, unspecified, uncomplicated: Secondary | ICD-10-CM

## 2021-12-12 NOTE — Patient Instructions (Signed)
Hi Mack,  It was great to catch up with you again! Please be on the lookout for the patient assistance paperwork for [facility].  Please reach out to me if you have any questions or need anything before our follow up!  Best, Maddie  [name], PharmD, [facility]   Visit Information   Goals Addressed   None    Patient Care Plan: CCM Pharmacy Care Plan     Problem Identified: Problem: Hypertension, Hyperlipidemia, Heart Failure, GERD, COPD, Depression, Tobacco use, and Hep C, Prediabetes      Long-Range Goal: Patient-Specific Goal   Start Date: 01/29/2021  Expected End Date: 01/29/2022  Recent Progress: On track  Priority: High  Note:   Current Barriers:  Unable to independently monitor therapeutic efficacy Unable to achieve control of cholesterol   Pharmacist Clinical Goal(s):  Patient will verbalize ability to afford treatment regimen achieve adherence to monitoring guidelines and medication adherence to achieve therapeutic efficacy through collaboration with PharmD and provider.   Interventions: 1:1 collaboration with Isaac Bliss, Rayford Halsted, MD regarding development and update of comprehensive plan of care as evidenced by provider attestation and co-signature Inter-disciplinary care team collaboration (see longitudinal plan of care) Comprehensive medication review performed; medication list updated in electronic medical record  Hypertension (BP goal <130/80) -Controlled (per office readings) -Current treatment: Entresto 24-26 mg 1 tablet twice daily - Appropriate, Effective, Safe, Accessible Spironolactone 25 mg 1 tablet daily - Appropriate, Effective, Safe, Accessible Metoprolol succinate 25 mg 1 tablet in the evening - Appropriate, Effective, Safe, Accessible Furosemide 40 mg 1 tablet daily - Appropriate, Effective, Safe, Accessible -Medications previously tried: n/a  -Current home readings:  130s-140s/70-80s  -Current dietary habits: eats at home mostly; eats more red meat; needs to bake fish and fried; doesn't add salt except for tomato sandwich; sardines sometimes; does eat fast food some (once or twice a month) -Current exercise habits: limited -Denies hypotensive/hypertensive symptoms -Educated on BP goals and benefits of medications for prevention of heart attack, stroke and kidney damage; Importance of home blood pressure monitoring; Proper BP monitoring technique; Symptoms of hypotension and importance of maintaining adequate hydration; -Counseled to monitor BP at home weekly, document, and provide log at future appointments -Counseled on diet and exercise extensively Recommended to continue current medication Recommended purchasing Omron arm cuff.  Hyperlipidemia: (LDL goal < 70) -Controlled -Current treatment: Rosuvastatin 40 mg 1 tablet daily - Appropriate, Effective, Safe, Accessible -Medications previously tried: none  -Current dietary patterns: does eat out some and fries food -Current exercise habits: limited -Educated on Cholesterol goals;  Benefits of statin for ASCVD risk reduction; Importance of limiting foods high in cholesterol; Exercise goal of 150 minutes per week; -Counseled on diet and exercise extensively Recommended to continue current medication Recommended repeat lipid panel in 2-3 months.  Heart Failure (Goal: manage symptoms and prevent exacerbations) -Controlled -Last ejection fraction: 35-40% (Date: 04/08/2019) -HF type: Combined Systolic and Diastolic -NYHA Class: I (no actitivty limitation) -AHA HF Stage: B (Heart disease present - no symptoms present) -Current treatment: Entresto 25-46 mg 1 tablet twice daily - Appropriate, Effective, Safe, Accessible Spironolactone 25 mg 1 tablet daily - Appropriate, Effective, Safe, Accessible Metoprolol succinate 25 mg 1 tablet in the evening - Appropriate, Effective, Safe, Accessible Furosemide  40 mg 1 tablet daily - Appropriate, Effective, Safe, Accessible -Medications previously tried: none  -Current home BP/HR readings: does not check -Current dietary habits: limits salt intake some -Current exercise habits: limited -Educated on  Benefits of medications for managing symptoms and prolonging life Importance of weighing daily; if you gain more than 3 pounds in one day or 5 pounds in one week, call cardiologist. Importance of blood pressure control -Counseled on diet and exercise extensively Recommended to continue current medication Assessed patient finances. Plan for PAP for Entresto.  Depression(Goal: minimize symptoms) -Controlled -Current treatment: Bupropion XL 150 mg 1 tablet daily - Appropriate, Effective, Safe, Accessible -Medications previously tried/failed: n/a -PHQ9: 1 -Educated on Benefits of medication for symptom control Benefits of cognitive-behavioral therapy with or without medication -Recommended to continue current medication  Insomnia (Goal: improve quality and quantity of sleep) -Uncontrolled -Current treatment  Trazodone 150 mg 1 tablet at bedtime - Appropriate, Effective, Safe, Accessible -Medications previously tried: none  -Counseled on practicing good sleep hygiene by setting a sleep schedule and maintaining it, avoid excessive napping, following a nightly routine, avoiding screen time for 30-60 minutes before going to bed, and making the bedroom a cool, quiet and dark space  Tobacco use (Goal quit smoking) -Uncontrolled -Previous quit attempts: Chantix -Current treatment  No medications -Patient smokes Within 30 minutes of waking -Patient triggers include: watching television and talking -On a scale of 1-10, reports MOTIVATION to quit is 3 -On a scale of 1-10, reports CONFIDENCE in quitting is 4 -Provided contact information for [place] [facility] (1-800-QUIT-NOW) and encouraged patient to reach out to this group for support. -Educated on benefits  of smoking cessation.  GERD (Goal: minimize symptoms) -Controlled -Current treatment  Omeprazole 20 mg 1 capsule daily - Appropriate, Effective, Query Safe, Accessible -Medications previously tried: none  -Recommended to continue current medication  Health Maintenance -Vaccine gaps: tetanus, COVID booster, influenza vaccine -Current therapy:  Multivitamin 1 tablet daily Aspirin 81 mg 1 tablet daily -Educated on Cost vs benefit of each product must be carefully weighed by individual consumer -Patient is satisfied with current therapy and denies issues -Recommended trying Voltaren gel again for pain as patient found this helpful previously.  Patient Goals/Self-Care Activities Patient will:  - take medications as prescribed check blood pressure weekly, document, and provide at future appointments target a minimum of 150 minutes of moderate intensity exercise weekly  Follow Up Plan: Telephone follow up appointment with care management team member scheduled for: 6 months       Patient verbalizes understanding of instructions and care plan provided today and agrees to view in [place]. Active MyChart status and patient understanding of how to access instructions and care plan via MyChart confirmed with patient.    Telephone follow up appointment with pharmacy team member scheduled for: 6 months  Viona Gilmore, [facility]

## 2021-12-12 NOTE — Telephone Encounter (Signed)
Referral placed.

## 2021-12-12 NOTE — Progress Notes (Unsigned)
Chronic Care Management Pharmacy Note  12/12/2021 Name:  Derrick Mata MRN:  [id number] DOB:  09/24/1950  Summary: BP at goal < 130/80 LDL at goal < 70  Recommendations/Changes made from today's visit: -Recommended purchasing a BP cuff and monitoring at home -Recommended scheduling visit with cardiology for Entresto refills  Plan: Mail PAP for Entresto Follow up BP assessment in 2-3 months Follow up in 6 months   Subjective: Derrick Mata is an 71 y.o. year old male who is a primary patient of Isaac Bliss, Rayford Halsted, MD.  The CCM team was consulted for assistance with disease management and care coordination needs.    Engaged with patient by telephone for follow up visit in response to provider referral for pharmacy case management and/or care coordination services.   Consent to Services:  The patient was given information about Chronic Care Management services, agreed to services, and gave verbal consent prior to initiation of services.  Please see initial visit note for detailed documentation.   Patient Care Team: Isaac Bliss, Rayford Halsted, MD as PCP - General (Internal Medicine) Stanford Breed Denice Bors, MD as PCP - Cardiology (Cardiology) Comer, Okey Regal, MD as Consulting Physician (Infectious Diseases) Monna Fam, MD as Consulting Physician (Ophthalmology) Viona Gilmore, [facility] as Pharmacist (Pharmacist)  Recent office visits: 11/01/21 Domingo Mend, MD: Patient presented for annual exam. Referred to eye doctor.  06/15/21 Caren Macadam, MD - Patient presented for COVID and other concerns. Prescribed Albuterol, Prescribed Chlorpheniramine, Prescribed Molnupiravir, Stopped Diclofenac.  Recent consult visits: 11/13/21 Deloria Lair, MD (ophthalmology): Patient presented for retina evaluation.  Hospital visits: Medication Reconciliation was completed by comparing discharge summary, patient's EMR and Pharmacy list, and upon discussion with patient.    Patient presented to [facility] on 08/29/21 for rib pain. Patient was there for 1 hour.   New?Medications Started at [facility] Discharge:?? -started hydrocodone-APAP 5-325 mg PRN x 3 days   Medication Changes at Hospital Discharge: -Changed None   Medications Discontinued at Hospital Discharge: -Stopped None   Medications that remain the same after Hospital Discharge:??  -All other medications will remain the same.    Objective:  Lab Results  Component Value Date   CREATININE 0.84 11/01/2021   BUN 10 11/01/2021   GFR 86.93 11/01/2021   GFRNONAA 78 07/04/2020   GFRAA 90 07/04/2020   NA 138 11/01/2021   K 4.3 11/01/2021   CALCIUM 8.7 11/01/2021   CO2 27 11/01/2021   GLUCOSE 87 11/01/2021    Lab Results  Component Value Date/Time   HGBA1C 6.1 11/01/2021 01:57 PM   HGBA1C 6.1 05/10/2021 10:03 AM   GFR 86.93 11/01/2021 01:57 PM   GFR 86.33 10/27/2020 11:05 AM    Last diabetic Eye exam:  Lab Results  Component Value Date/Time   HMDIABEYEEXA No Retinopathy 03/25/2016 12:00 AM    Last diabetic Foot exam: No results found for: "HMDIABFOOTEX"   Lab Results  Component Value Date   CHOL 107 11/01/2021   HDL 34.30 (L) 11/01/2021   LDLCALC 59 11/01/2021   TRIG 68.0 11/01/2021   CHOLHDL 3 11/01/2021       Latest Ref Rng & Units 11/01/2021    1:57 PM 10/27/2020   11:05 AM 12/08/2018   12:17 PM  Hepatic Function  Total Protein 6.0 - 8.3 g/dL 6.7  7.1  6.7   Albumin 3.5 - 5.2 g/dL 3.5  3.7  3.6   AST 0 - 37 U/L 8  11  10   ALT 0 - 53 U/L _0 Alk Phosphatase 39 - 117 U/L 50  54  55   Total Bilirubin 0.2 - 1.2 mg/dL 0.4  0.5  0.4     Lab Results  Component Value Date/Time   TSH 1.77 11/01/2021 01:57 PM   TSH 2.06 10/27/2020 11:05 AM   FREET4 1.12 03/29/2011 06:15 AM       Latest Ref Rng & Units 11/01/2021    1:57 PM 10/27/2020   11:05 AM 12/08/2018   12:17 PM  CBC  WBC 4.0 - 10.5 K/uL 8.9  8.3  8.2   Hemoglobin 13.0 - 17.0 g/dL  14.0  14.2  13.9   Hematocrit 39.0 - 52.0 % 42.7  42.6  42.9   Platelets 150.0 - 400.0 K/uL 284.0  277.0  269.0     Lab Results  Component Value Date/Time   VD25OH 52.55 11/01/2021 01:57 PM   VD25OH 34.48 10/27/2020 11:05 AM    Clinical ASCVD: Yes  The ASCVD Risk score (Arnett DK, et al., 2019) failed to calculate for the following reasons:   The valid total cholesterol range is 130 to 320 mg/dL       11/01/2021    3:37 PM 10/27/2020   10:23 AM 07/04/2020    9:23 AM  Depression screen PHQ 2/9  Decreased Interest  0 0  Down, Depressed, Hopeless 0 0 0  PHQ - 2 Score 0 0 0  Altered sleeping 0 0 1  Tired, decreased energy 0 1 0  Change in appetite 0 0 0  Feeling bad or failure about yourself  0 0 0  Trouble concentrating 0 0 0  Moving slowly or fidgety/restless 0 0 0  Suicidal thoughts 0 0 0  PHQ-9 Score 0 1 1  Difficult doing work/chores Not difficult at all  Not difficult at all      Social History   Tobacco Use  Smoking Status Every Day   Packs/day: 1.00   Years: 51.00   Total pack years: 51.00   Types: Cigarettes   Start date: 05/21/1963  Smokeless Tobacco Never  Tobacco Comments   not ready to quit yet   BP Readings from Last 3 Encounters:  11/01/21 120/70  08/29/21 134/85  04/10/21 (!) 165/95   Pulse Readings from Last 3 Encounters:  11/01/21 70  08/29/21 63  04/10/21 66   Wt Readings from Last 3 Encounters:  11/01/21 (!) 401 lb 11.2 oz (182.2 kg)  04/10/21 (!) 421 lb 1.3 oz (191 kg)  01/25/21 (!) 421 lb (191 kg)   BMI Readings from Last 3 Encounters:  11/01/21 56.82 kg/m  04/10/21 57.11 kg/m  01/25/21 57.10 kg/m    Assessment/Interventions: Review of patient past medical history, allergies, medications, health status, including review of consultants reports, laboratory and other test data, was performed as part of comprehensive evaluation and provision of chronic care management services.   SDOH:  (Social Determinants of Health) assessments and  interventions performed: No   SDOH Screenings   Alcohol Screen: Not on file  Depression (PHQ2-9): Low Risk  (11/01/2021)   Depression (PHQ2-9)    PHQ-2 Score: 0  Financial Resource Strain: Low Risk  (02/15/2021)   Overall Financial Resource Strain (CARDIA)    Difficulty of Paying Living Expenses: Not very hard  Food Insecurity: Not on file  Housing: Not on file  Physical Activity: Not on file  Social Connections: Not on file  Stress: Not  on file  Tobacco Use: High Risk (11/13/2021)   Patient History    Smoking Tobacco Use: Every Day    Smokeless Tobacco Use: Never    Passive Exposure: Not on file  Transportation Needs: No Transportation Needs (02/15/2021)   PRAPARE - Transportation    Lack of Transportation (Medical): No    Lack of Transportation (Non-Medical): No    CCM Care Plan  Allergies  Allergen Reactions   Penicillins Anaphylaxis, Nausea Only and Other (See Comments)    Passed out. Has patient had a PCN reaction causing immediate rash, facial/tongue/throat swelling, SOB or lightheadedness with hypotension: {no Has patient had a PCN reaction causing severe rash involving mucus membranes or skin necrosis: {no Has patient had a PCN reaction that required hospitalization {no Has patient had a PCN reaction occurring within the last 10 years: {no If all of the above answers are "NO", then may proceed with Cephalosporin use.   Sulfamethoxazole Hives    Medications Reviewed Today     Reviewed by Hurman Horn, MD (Physician) on 11/13/21 at [place] List Status: <None>   Medication Order Taking? Sig Documenting Provider Last Dose Status Informant  albuterol (VENTOLIN HFA) 108 (90 Base) MCG/ACT inhaler [id number] No Inhale 2 puffs into the lungs every 6 (six) hours as needed for wheezing or shortness of breath. Caren Macadam, MD Taking Active   aspirin EC 81 MG tablet [id number] No Take 81 mg by mouth at bedtime. [provider] Taking Active Self  Blood  Pressure Monitoring (ADULT BLOOD PRESSURE CUFF LG) KIT [id number] No Use daily to monitor blood pressure Isaac Bliss, Rayford Halsted, MD Taking Active   buPROPion (WELLBUTRIN XL) 150 MG 24 hr tablet [id number] No TAKE 1 TABLET(150 MG) BY MOUTH DAILY Isaac Bliss, Rayford Halsted, MD Taking Active   Chlorpheniramine-DM (CORICIDIN COUGH/COLD) 4-30 MG TABS [id number] No Take per manufacturers instructions. Caren Macadam, MD Taking Active   furosemide (LASIX) 40 MG tablet [id number] No Take 1 tablet (40 mg total) by mouth daily. Isaac Bliss, Rayford Halsted, MD Taking Active   metoprolol succinate (TOPROL-XL) 25 MG 24 hr tablet [id number] No TAKE 1 TABLET BY MOUTH DAILY. SCHEDULE OFFICE VISIT FOR FUTURE REFILLS Lelon Perla, MD Taking Active   Multiple Vitamin (MULTIVITAMIN WITH MINERALS) TABS tablet [id number] No Take 1 tablet by mouth daily. [provider] Taking Active Self  omeprazole (PRILOSEC) 20 MG capsule [id number] No TAKE 1 CAPSULE(20 MG) BY MOUTH DAILY Isaac Bliss, Rayford Halsted, MD Taking Active   rosuvastatin (CRESTOR) 40 MG tablet [id number] No Take 1 tablet (40 mg total) by mouth daily. Lelon Perla, MD Taking Expired 06/13/21 2359   sacubitril-valsartan (ENTRESTO) 24-26 MG [id number] No Take 1 tablet by mouth 2 (two) times daily. Appointment needed for continuation of refills-1st attempt Lelon Perla, MD Taking Active   Spacer/Aero-Holding Chambers DEVI [id number] No 1 Device by Does not apply route daily as needed. Caren Macadam, MD Taking Active   spironolactone (ALDACTONE) 25 MG tablet [id number] No Take 1 tablet (25 mg total) by mouth daily. Lelon Perla, MD Taking Active Self  traZODone (DESYREL) 150 MG tablet [id number] No TAKE 1 TABLET(150 MG) BY MOUTH AT BEDTIME Isaac Bliss, Rayford Halsted, MD Taking Active             Patient Active Problem List   Diagnosis Date Noted   Macular hole, right eye 11/13/2021   Nuclear sclerotic cataract of both eyes  11/13/2021   Vitreous floaters, left  11/13/2021   Benign neoplasm of ascending colon    Portal hypertension (HCC)    Portal hypertensive gastropathy (HCC)    Esophageal ring    IGT (impaired glucose tolerance) 12/09/2018   History of colonic polyps    Recurrent major depressive disorder, in full remission ([place]) 07/07/2017   Aortic atherosclerosis ([place]) 07/02/2017   Frequent headaches 01/03/2017   Benign neoplasm of descending colon    Benign neoplasm of rectum    Hyperglycemia 02/01/2016   Hepatic cirrhosis ([place]) 12/19/2015   Super obesity 07/18/2015   Obesity hypoventilation syndrome ([place]) 07/18/2015   CHF (congestive heart failure), NYHA class I ([place]) 07/18/2015   H/O: substance abuse ([place]) 07/18/2015   OSA and COPD overlap syndrome ([place]) 07/18/2015   Noncompliance with CPAP treatment 07/18/2015   Moderate smoker (20 or less per day) 06/19/2015   Hep C w/o coma, chronic ([place]) 06/12/2015   ERECTILE DYSFUNCTION 01/31/2010   KNEE PAIN, LEFT, CHRONIC 05/18/2009   TOBACCO USE 06/23/2007   Essential hypertension 11/24/2006   Osteoarthritis 11/24/2006   OSA (obstructive sleep apnea) 11/24/2006    Immunization History  Administered Date(s) Administered   Hepatitis A, Adult 12/19/2015, 10/03/2016   Hepatitis B, adult 08/21/2015, 04/04/2016   Hepatitis B, ped/adol 07/19/2015   Influenza Split 03/28/2011   Influenza Whole 06/23/2007, 02/15/2009, 01/31/2010   Influenza, High Dose Seasonal PF 02/01/2016, 03/10/2017, 02/19/2018, 02/20/2019   Influenza,inj,Quad PF,6+ Mos 01/12/2013, 02/25/2014   Influenza-Unspecified 01/19/2015, 02/17/2017, 05/03/2020, 03/06/2021   PFIZER Comirnaty([place])Covid-19 Tri-Sucrose Vaccine 06/26/2019, 07/17/2019, 03/03/2020, 03/06/2021   Pneumococcal Conjugate-13 09/08/2014   Pneumococcal Polysaccharide-23 07/17/2015   Tdap 08/29/2021   Zoster Recombinat (Shingrix) 04/02/2017, 07/29/2017   Zoster, Live 02/08/2012    Annual low dose CT scan? Scheduled  next week  Question COPD? Do CAT or MMRC?   Sleep? Smoking?  Patient will be married 45 years in September. He just feels like his wife is more serious than he is. He said some people think he could be a comedian.   Conditions to be addressed/monitored:  Hypertension, Hyperlipidemia, Heart Failure, GERD, COPD, Depression, Tobacco use, and Hep C, pre-diabetes  Conditions addressed this visit: Hypertension, heart failure, tobacco use***  Care Plan : CCM Pharmacy Care Plan  Updates made by Viona Gilmore, [place] since 12/12/2021 12:00 AM     Problem: Problem: Hypertension, Hyperlipidemia, Heart Failure, GERD, COPD, Depression, Tobacco use, and Hep C, Prediabetes      Long-Range Goal: Patient-Specific Goal   Start Date: 01/29/2021  Expected End Date: 01/29/2022  Recent Progress: On track  Priority: High  Note:   Current Barriers:  Unable to independently monitor therapeutic efficacy Unable to achieve control of cholesterol   Pharmacist Clinical Goal(s):  Patient will verbalize ability to afford treatment regimen achieve adherence to monitoring guidelines and medication adherence to achieve therapeutic efficacy through collaboration with PharmD and provider.   Interventions: 1:1 collaboration with Isaac Bliss, Rayford Halsted, MD regarding development and update of comprehensive plan of care as evidenced by provider attestation and co-signature Inter-disciplinary care team collaboration (see longitudinal plan of care) Comprehensive medication review performed; medication list updated in electronic medical record  Hypertension (BP goal <130/80) -Controlled (per office readings) -Current treatment: Entresto 24-26 mg 1 tablet twice daily - Appropriate, Effective, Safe, Accessible Spironolactone 25 mg 1 tablet daily - Appropriate, Effective, Safe, Accessible Metoprolol succinate 25 mg 1 tablet in the evening - Appropriate, Effective, Safe, Accessible Furosemide 40 mg 1 tablet daily -  Appropriate, Effective, Safe, Accessible -Medications previously tried:  n/a  -Current home readings: 130s-140s/70-80s  -Current dietary habits: eats at home mostly; eats more red meat; needs to bake fish and fried; doesn't add salt except for tomato sandwich; sardines sometimes; does eat fast food some (once or twice a month) -Current exercise habits: limited -Denies hypotensive/hypertensive symptoms -Educated on BP goals and benefits of medications for prevention of heart attack, stroke and kidney damage; Importance of home blood pressure monitoring; Proper BP monitoring technique; Symptoms of hypotension and importance of maintaining adequate hydration; -Counseled to monitor BP at home weekly, document, and provide log at future appointments -Counseled on diet and exercise extensively Recommended to continue current medication Recommended purchasing Omron arm cuff.  Hyperlipidemia: (LDL goal < 70) -Controlled -Current treatment: Rosuvastatin 40 mg 1 tablet daily - Appropriate, Effective, Safe, Accessible -Medications previously tried: none  -Current dietary patterns: does eat out some and fries food -Current exercise habits: limited -Educated on Cholesterol goals;  Benefits of statin for ASCVD risk reduction; Importance of limiting foods high in cholesterol; Exercise goal of 150 minutes per week; -Counseled on diet and exercise extensively Recommended to continue current medication Recommended repeat lipid panel in 2-3 months.  Heart Failure (Goal: manage symptoms and prevent exacerbations) -Controlled -Last ejection fraction: 35-40% (Date: 04/08/2019) -HF type: Combined Systolic and Diastolic -NYHA Class: I (no actitivty limitation) -AHA HF Stage: B (Heart disease present - no symptoms present) -Current treatment: Entresto 25-46 mg 1 tablet twice daily - Appropriate, Effective, Safe, Accessible Spironolactone 25 mg 1 tablet daily - Appropriate, Effective, Safe,  Accessible Metoprolol succinate 25 mg 1 tablet in the evening - Appropriate, Effective, Safe, Accessible Furosemide 40 mg 1 tablet daily - Appropriate, Effective, Safe, Accessible -Medications previously tried: none  -Current home BP/HR readings: does not check -Current dietary habits: limits salt intake some -Current exercise habits: limited -Educated on Benefits of medications for managing symptoms and prolonging life Importance of weighing daily; if you gain more than 3 pounds in one day or 5 pounds in one week, call cardiologist. Importance of blood pressure control -Counseled on diet and exercise extensively Recommended to continue current medication Assessed patient finances. Plan for PAP for Entresto.  Depression(Goal: minimize symptoms) -Controlled -Current treatment: Bupropion XL 150 mg 1 tablet daily - Appropriate, Effective, Safe, Accessible -Medications previously tried/failed: n/a -PHQ9: 1 -Educated on Benefits of medication for symptom control Benefits of cognitive-behavioral therapy with or without medication -Recommended to continue current medication  Insomnia (Goal: improve quality and quantity of sleep) -Uncontrolled -Current treatment  Trazodone 150 mg 1 tablet at bedtime - Appropriate, Effective, Safe, Accessible -Medications previously tried: none  -Counseled on practicing good sleep hygiene by setting a sleep schedule and maintaining it, avoid excessive napping, following a nightly routine, avoiding screen time for 30-60 minutes before going to bed, and making the bedroom a cool, quiet and dark space  Tobacco use (Goal quit smoking) -Uncontrolled -Previous quit attempts: Chantix -Current treatment  No medications -Patient smokes Within 30 minutes of waking -Patient triggers include: watching television and talking -On a scale of 1-10, reports MOTIVATION to quit is 3 -On a scale of 1-10, reports CONFIDENCE in quitting is 4 -Provided contact information  for [place] Quit Line (1-800-QUIT-NOW) and encouraged patient to reach out to this group for support. -Educated on benefits of smoking cessation.  GERD (Goal: minimize symptoms) -Controlled -Current treatment  Omeprazole 20 mg 1 capsule daily - Appropriate, Effective, Query Safe, Accessible -Medications previously tried: none  -Recommended to continue current medication  Health Maintenance -Vaccine gaps:  tetanus, COVID booster, influenza vaccine -Current therapy:  Multivitamin 1 tablet daily Aspirin 81 mg 1 tablet daily -Educated on Cost vs benefit of each product must be carefully weighed by individual consumer -Patient is satisfied with current therapy and denies issues -Recommended trying Voltaren gel again for pain as patient found this helpful previously.  Patient Goals/Self-Care Activities Patient will:  - take medications as prescribed check blood pressure weekly, document, and provide at future appointments target a minimum of 150 minutes of moderate intensity exercise weekly  Follow Up Plan: Telephone follow up appointment with care management team member scheduled for: 6 months        Medication Assistance: None required.  Patient affirms current coverage meets needs.  Compliance/Adherence/Medication fill history: Care Gaps: COVID booster BP - 120/70 11/01/21  Star-Rating Drugs: Rosuvastatin (Crestor) 40 mg - Last filled 11/22/21 90 DS at [facility]  Patient's preferred pharmacy is:  [facility], [place] [address] Phone: [phone] Fax: [place], [place] - [place] [facility] [place] [address] Phone: [phone] Fax: [phone]  [id number] - [place], [place] - [address] AT [address] [place] [address] Phone: [phone] Fax: [phone]   Uses pill  box? Yes - 2 weeks at a time Pt endorses 99% compliance  We discussed: Current pharmacy is preferred with insurance plan and patient is satisfied with pharmacy services Patient decided to: Continue current medication management strategy  Care Plan and Follow Up Patient Decision:  Patient agrees to Care Plan and Follow-up.  Plan: Telephone follow up appointment with care management team member scheduled for:  6 months  [name], PharmD, [place] Pharmacist [place] at [place] [phone]

## 2021-12-12 NOTE — Telephone Encounter (Signed)
-----   Message from Viona Gilmore, [facility] sent at 12/12/2021  9:28 AM EDT ----- Regarding: CCM referral Hi,  I forgot to tell you that we are required to have referrals again (per [facility]) to see patients even if they are follow ups. [name] needs one placed. The new code is [id number]. If you need me to show you how to place it, I can definitely do that today.  Let me know!  Thanks, Maddie

## 2021-12-17 ENCOUNTER — Ambulatory Visit
Admission: RE | Admit: 2021-12-17 | Discharge: 2021-12-17 | Disposition: A | Discharge status: Home / Self Care | Payer: Medicare Other | Source: Ambulatory Visit | Attending: Internal Medicine | Admitting: Internal Medicine

## 2021-12-17 DIAGNOSIS — F17210 Nicotine dependence, cigarettes, uncomplicated: Secondary | ICD-10-CM | POA: Diagnosis not present

## 2021-12-17 DIAGNOSIS — I5040 Unspecified combined systolic (congestive) and diastolic (congestive) heart failure: Secondary | ICD-10-CM | POA: Diagnosis not present

## 2021-12-17 DIAGNOSIS — I10 Essential (primary) hypertension: Secondary | ICD-10-CM

## 2021-12-17 DIAGNOSIS — Z87891 Personal history of nicotine dependence: Secondary | ICD-10-CM

## 2021-12-18 ENCOUNTER — Other Ambulatory Visit: Payer: Self-pay | Admitting: Acute Care

## 2021-12-18 ENCOUNTER — Other Ambulatory Visit: Payer: Self-pay | Admitting: Internal Medicine

## 2021-12-18 DIAGNOSIS — Z122 Encounter for screening for malignant neoplasm of respiratory organs: Secondary | ICD-10-CM

## 2021-12-18 DIAGNOSIS — F17210 Nicotine dependence, cigarettes, uncomplicated: Secondary | ICD-10-CM

## 2021-12-18 DIAGNOSIS — F3342 Major depressive disorder, recurrent, in full remission: Secondary | ICD-10-CM

## 2021-12-18 DIAGNOSIS — Z87891 Personal history of nicotine dependence: Secondary | ICD-10-CM

## 2021-12-20 ENCOUNTER — Telehealth: Payer: Self-pay | Admitting: Cardiology

## 2021-12-20 DIAGNOSIS — I5040 Unspecified combined systolic (congestive) and diastolic (congestive) heart failure: Secondary | ICD-10-CM

## 2021-12-20 NOTE — Telephone Encounter (Signed)
Notice sent to patient in [place]

## 2021-12-20 NOTE — Telephone Encounter (Signed)
*  STAT* If patient is at the pharmacy, call can be transferred to refill team.   1. Which medications need to be refilled? (please list name of each medication and dose if known)   spironolactone (ALDACTONE) 25 MG tablet  2. Which pharmacy/location (including street and city if local pharmacy) is medication to be sent to?   [place], [place] - [address] AT [place]  3. Do they need a 30 day or 90 day supply? 90   Pt states he is completely out. Pt made a f/u appt on 01/22/22 at 10:30am with [facility]

## 2021-12-20 NOTE — Telephone Encounter (Signed)
spironolactone (ALDACTONE) 25 MG tablet 90 tablet 1 11/15/2021    Sig - Route: Take 1 tablet (25 mg total) by mouth daily. 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

## 2022-01-10 ENCOUNTER — Other Ambulatory Visit: Payer: Self-pay | Admitting: Internal Medicine

## 2022-01-15 ENCOUNTER — Other Ambulatory Visit: Payer: Self-pay | Admitting: Cardiology

## 2022-01-15 ENCOUNTER — Other Ambulatory Visit: Payer: Self-pay | Admitting: Internal Medicine

## 2022-01-21 ENCOUNTER — Other Ambulatory Visit: Payer: Self-pay | Admitting: Internal Medicine

## 2022-01-21 DIAGNOSIS — G4700 Insomnia, unspecified: Secondary | ICD-10-CM

## 2022-01-22 ENCOUNTER — Other Ambulatory Visit: Payer: Self-pay | Admitting: Cardiology

## 2022-01-22 ENCOUNTER — Ambulatory Visit: Payer: Medicare Other | Admitting: General Practice

## 2022-01-22 DIAGNOSIS — I5040 Unspecified combined systolic (congestive) and diastolic (congestive) heart failure: Secondary | ICD-10-CM

## 2022-01-22 NOTE — Progress Notes (Deleted)
Cardiology Clinic Note   Patient Name: Derrick Mata Date of Encounter: 01/22/2022  Primary Care Provider:  Isaac Bliss, Rayford Halsted, MD Primary Cardiologist:  Kirk Ruths, MD  Patient Profile    Derrick Mata 71 year old male presents to the clinic today for follow-up evaluation of her essential hypertension and CHF.  Past Medical History    Past Medical History:  Diagnosis Date   Arthritis    OA of knees   CHF (congestive heart failure) (HCC)    Closed compression fracture of L1 lumbar vertebra 09/03/2016   Colon polyp    Complication of anesthesia    slow to wake up   COPD (chronic obstructive pulmonary disease) (HCC)    > 30 pack yr smoker   Depression    Anxiety and Insomnia   Diverticulosis    Erectile dysfunction    GERD (gastroesophageal reflux disease)    Hemorrhoids    Hep C w/o coma, chronic ([place]) 06/12/2015   treated with medications   Leg edema    Morbid obesity ([place])    Sleep apnea    refuses CPAP   Substance abuse ([place])    hx alcohol and cocaine use, 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, 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   Past Surgical History:  Procedure Laterality Date   COLONOSCOPY  04/18/2011   Procedure: COLONOSCOPY;  Surgeon: Scarlette Shorts, MD;  Location: [facility];  Service: Endoscopy;  Laterality: N/A;   COLONOSCOPY WITH PROPOFOL N/A 05/07/2016   Procedure: COLONOSCOPY WITH PROPOFOL;  Surgeon: Irene Shipper, MD;  Location: [facility];  Service: Endoscopy;  Laterality: N/A;   COLONOSCOPY WITH PROPOFOL N/A 08/12/2017   Procedure: COLONOSCOPY WITH PROPOFOL;  Surgeon: Irene Shipper, MD;  Location: [facility];  Service: Endoscopy;  Laterality: N/A;   COLONOSCOPY WITH PROPOFOL N/A 01/25/2021   Procedure: COLONOSCOPY WITH PROPOFOL;  Surgeon: Irene Shipper, MD;  Location: [facility];  Service: Endoscopy;  Laterality: N/A;   ESOPHAGOGASTRODUODENOSCOPY (EGD) WITH PROPOFOL N/A 05/07/2016   Procedure: ESOPHAGOGASTRODUODENOSCOPY (EGD) WITH PROPOFOL;   Surgeon: Irene Shipper, MD;  Location: [facility];  Service: Endoscopy;  Laterality: N/A;   ESOPHAGOGASTRODUODENOSCOPY (EGD) WITH PROPOFOL N/A 01/25/2021   Procedure: ESOPHAGOGASTRODUODENOSCOPY (EGD) WITH PROPOFOL;  Surgeon: Irene Shipper, MD;  Location: [facility];  Service: Endoscopy;  Laterality: N/A;   KNEE ARTHROSCOPY  2011   left   LUMBAR LAMINECTOMY  1994, 2010   POLYPECTOMY  01/25/2021   Procedure: POLYPECTOMY;  Surgeon: Irene Shipper, MD;  Location: [facility];  Service: Endoscopy;;   TIBIA FRACTURE SURGERY  2006   hardware   TONSILLECTOMY  1963    Allergies  Allergies  Allergen Reactions   Penicillins Anaphylaxis, Nausea Only and Other (See Comments)    Passed out. Has patient had a PCN reaction causing immediate rash, facial/tongue/throat swelling, SOB or lightheadedness with hypotension: {no Has patient had a PCN reaction causing severe rash involving mucus membranes or skin necrosis: {no Has patient had a PCN reaction that required hospitalization {no Has patient had a PCN reaction occurring within the last 10 years: {no If all of the above answers are "NO", then may proceed with Cephalosporin use.   Sulfamethoxazole Hives    History of Present Illness    Derrick Mata has a PMH of essential hypertension, CHF, aortic atherosclerosis, portal hypertension, OSA, COPD, hepatic cirrhosis, tobacco use, chronic left knee pain, obesity, hyperglycemia, frequent headaches, depression, and history of colon polyps. He was seen  for edema 10/19.  His follow-up echocardiogram 11/19 showed an EF of 58-85% and mild diastolic dysfunction.  He was noted to have by atrial enlargement, and severe right ventricular enlargement.  He had a nuclear stress test 1/20 which showed an EF of 47% and no ischemia or infarct.  His abdominal ultrasound 9/20 showed no aneurysm.  His echocardiogram 11/20 showed an EF of 35-40%, G1 DD, trace mitral and tricuspid regurgitation, mildly dilated aortic  root.  He was seen by Dr. Stanford Breed on 07/04/2020.  During that time he denied dyspnea, chest pain, palpitations, and syncope.  He presents to the clinic today for follow-up evaluation and states***  *** denies chest pain, shortness of breath, lower extremity edema, fatigue, palpitations, melena, hematuria, hemoptysis, diaphoresis, weakness, presyncope, syncope, orthopnea, and PND.  Dilated cardiomyopathy-no increased DOE or activity intolerance.  Echocardiogram 04/08/2019 showed an LVEF of 35-40 percent, mildly increased LVH, G1 DD, trace mitral and tricuspid regurgitation and mildly dilated aortic root. Repeat echocardiogram Continue metoprolol, Entresto, spironolactone, furosemide Heart healthy low-sodium diet-salty 6 given Increase physical activity as tolerated Order BMP, CBC  Essential hypertension-BP today*** Continue spironolactone, metoprolol Heart healthy low-sodium diet-salty 6 given Increase physical activity as tolerated  Combined systolic and diastolic CHF-NYHA class I.  Continues to stay fairly physically active.  Weight stable.  Euvolemic. Continue Entresto, furosemide, spironolactone Heart healthy low-sodium diet-salty 6 given Increase physical activity as tolerated  Tobacco abuse-continues to smoke***per day. Tobacco cessation strongly recommended Tobacco cessation information given.  Disposition: Follow-up with Dr. Stanford Breed in 12 months.   Home Medications    Prior to Admission medications   Medication Sig Start Date End Date Taking? Authorizing Provider  albuterol (VENTOLIN HFA) 108 (90 Base) MCG/ACT inhaler Inhale 2 puffs into the lungs every 6 (six) hours as needed for wheezing or shortness of breath. 06/15/21   Caren Macadam, MD  aspirin EC 81 MG tablet Take 81 mg by mouth at bedtime.    [provider]  Blood Pressure Monitoring (ADULT BLOOD PRESSURE CUFF LG) KIT Use daily to monitor blood pressure 03/15/21   Isaac Bliss, Rayford Halsted, MD   buPROPion (WELLBUTRIN XL) 150 MG 24 hr tablet TAKE 1 TABLET(150 MG) BY MOUTH DAILY 12/19/21   Isaac Bliss, Rayford Halsted, MD  Chlorpheniramine-DM (CORICIDIN COUGH/COLD) 4-30 MG TABS Take per manufacturers instructions. 06/15/21   Caren Macadam, MD  furosemide (LASIX) 40 MG tablet TAKE 1 TABLET(40 MG) BY MOUTH DAILY 01/15/22   Isaac Bliss, Rayford Halsted, MD  metoprolol succinate (TOPROL-XL) 25 MG 24 hr tablet TAKE 1 TABLET BY MOUTH DAILY SCHEDULE OFFICE VISIT FOR FUTURE REFILLS 01/16/22   Lelon Perla, MD  Multiple Vitamin (MULTIVITAMIN WITH MINERALS) TABS tablet Take 1 tablet by mouth daily.    [provider]  omeprazole (PRILOSEC) 20 MG capsule TAKE 1 CAPSULE(20 MG) BY MOUTH DAILY 01/10/22   Isaac Bliss, Rayford Halsted, MD  rosuvastatin (CRESTOR) 40 MG tablet Take 1 tablet (40 mg total) by mouth daily. 01/30/21 06/13/21  Lelon Perla, MD  sacubitril-valsartan (ENTRESTO) 24-26 MG TAKE 1 TABLET BY MOUTH TWICE DAILY, NEED OV FOR MORE 11/13/21   Lelon Perla, MD  Spacer/Aero-Holding Chambers DEVI 1 Device by Does not apply route daily as needed. 06/15/21   Caren Macadam, MD  spironolactone (ALDACTONE) 25 MG tablet Take 1 tablet (25 mg total) by mouth daily. 11/15/21   Lelon Perla, MD  traZODone (DESYREL) 150 MG tablet TAKE 1 TABLET(150 MG) BY MOUTH AT BEDTIME 10/23/21  Isaac Bliss, Rayford Halsted, MD    Family History    Family History  Problem Relation Age of Onset   Breast cancer Mother    Heart disease Father 5   Colon cancer Sister    Cancer Sister 29       sister, colon    Heart disease Other    Stomach cancer Neg Hx    Rectal cancer Neg Hx    Esophageal cancer Neg Hx    Pancreatic cancer Neg Hx    He indicated that his mother is deceased. He indicated that his father is deceased. He indicated that his sister is alive. He indicated that the status of his neg hx is unknown. He indicated that the status of his other is unknown.  Social History    Social  History   Socioeconomic History   Marital status: Married    Spouse name: Not on file   Number of children: 4   Years of education: Not on file   Highest education level: Not on file  Occupational History   Occupation: retired  Tobacco Use   Smoking status: Every Day    Packs/day: 1.00    Years: 51.00    Total pack years: 51.00    Types: Cigarettes    Start date: 05/21/1963   Smokeless tobacco: Never   Tobacco comments:    not ready to quit yet  Vaping Use   Vaping Use: Never used  Substance and Sexual Activity   Alcohol use: No    Alcohol/week: 0.0 standard drinks of alcohol   Drug use: No    Comment: last dose November 2012   Sexual activity: Yes    Partners: Female    Comment: states no alcohol in 3 weeks  Other Topics Concern   Not on file  Social History Narrative   Updated 06/12/15   Work or School: none, 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      Home Situation: lives with wife and grandson      Spiritual Beliefs: [name], does not attend church      Lifestyle: no regular exercise, diet is not great but is interested in this      Social Determinants of Health   Financial Resource Strain: Low Risk  (02/15/2021)   Overall Financial Resource Strain (CARDIA)    Difficulty of Paying Living Expenses: Not very hard  Food Insecurity: Not on file  Transportation Needs: No Transportation Needs (02/15/2021)   PRAPARE - Hydrologist (Medical): No    Lack of Transportation (Non-Medical): No  Physical Activity: Not on file  Stress: Not on file  Social Connections: Not on file  Intimate Partner Violence: Not on file     Review of Systems    General:  No chills, fever, night sweats or weight changes.  Cardiovascular:  No chest pain, dyspnea on exertion, edema, orthopnea, palpitations, paroxysmal nocturnal dyspnea. Dermatological: No rash, lesions/masses Respiratory: No cough, dyspnea Urologic: No hematuria, dysuria Abdominal:   No nausea,  vomiting, diarrhea, bright red blood per rectum, melena, or hematemesis Neurologic:  No visual changes, wkns, changes in mental status. All other systems reviewed and are otherwise negative except as noted above.  Physical Exam    VS:  There were no vitals taken for this visit. , BMI There is no height or weight on file to calculate BMI. GEN: Well nourished, well developed, in no acute distress. HEENT: normal. Neck: Supple, no JVD, carotid bruits, or  masses. Cardiac: RRR, no murmurs, rubs, or gallops. No clubbing, cyanosis, edema.  Radials/DP/PT 2+ and equal bilaterally.  Respiratory:  Respirations regular and unlabored, clear to auscultation bilaterally. GI: Soft, nontender, nondistended, BS + x 4. MS: no deformity or atrophy. Skin: warm and dry, no rash. Neuro:  Strength and sensation are intact. Psych: Normal affect.  Accessory Clinical Findings    Recent Labs: 11/01/2021: ALT 3; BUN 10; Creatinine, Ser 0.84; Hemoglobin 14.0; Platelets 284.0; Potassium 4.3; Sodium 138; TSH 1.77   Recent Lipid Panel    Component Value Date/Time   CHOL 107 11/01/2021 1357   TRIG 68.0 11/01/2021 1357   HDL 34.30 (L) 11/01/2021 1357   CHOLHDL 3 11/01/2021 1357   VLDL 13.6 11/01/2021 1357   LDLCALC 59 11/01/2021 1357    No BP recorded.  {Refresh Note OR Click here to enter BP  :1}***    ECG personally reviewed by me today- *** - No acute changes  Echocardiogram 04/08/2019 IMPRESSIONS     1. Left ventricular ejection fraction, by visual estimation, is 35 to  40%. The left ventricle has moderately decreased function. There is mildly  increased left ventricular hypertrophy.   2. Left ventricular diastolic parameters are consistent with Grade I  diastolic dysfunction (impaired relaxation).   3. Global right ventricle has normal systolic function.The right  ventricular size is normal. No increase in right ventricular wall  thickness.   4. Left atrial size was normal.   5. Right atrial size  was normal.   6. The mitral valve is normal in structure. Trace mitral valve  regurgitation. No evidence of mitral stenosis.   7. The tricuspid valve is normal in structure. Tricuspid valve  regurgitation is trivial.   8. The aortic valve is tricuspid. Aortic valve regurgitation is not  visualized. Mild aortic valve sclerosis without stenosis.   9. The pulmonic valve was not well visualized. Pulmonic valve  regurgitation is not visualized.  10. Aortic dilatation noted.  11. There is mild dilatation of the ascending aorta measuring 39 mm.  12. The inferior vena cava is normal in size with greater than 50%  respiratory variability, suggesting right atrial pressure of 3 mmHg.  13. Moderate global reduction in LV systolic function; grade 1 diastolic  dysfunction; mild LVH; mildly dilated ascending aorta;.  Assessment & Plan   1.  ***   Jossie Ng. Jevin Camino NP-C     01/22/2022, 7:17 AM [place] [address] [address] Office [phone] Fax [phone]  Notice: This dictation was prepared with Dragon dictation along with smaller phrase technology. Any transcriptional errors that result from this process are unintentional and may not be corrected upon review.  I spent***minutes examining this patient, reviewing medications, and using patient centered shared decision making involving her cardiac care.  Prior to her visit I spent greater than 20 minutes reviewing her past medical history,  medications, and prior cardiac tests.

## 2022-01-23 ENCOUNTER — Ambulatory Visit: Payer: Medicare Other | Attending: General Practice | Admitting: General Practice

## 2022-01-23 ENCOUNTER — Other Ambulatory Visit: Payer: Self-pay | Admitting: Cardiology

## 2022-01-23 DIAGNOSIS — I5040 Unspecified combined systolic (congestive) and diastolic (congestive) heart failure: Secondary | ICD-10-CM

## 2022-01-29 NOTE — Progress Notes (Unsigned)
Cardiology Office Note:    Date:  01/30/2022   ID:  Derrick Mata, DOB 1950/11/16, MRN [id number]  PCP:  Derrick Mata, Derrick Halsted, MD   [facility] Cardiologist:  Derrick Ruths, MD Cardiology APP:  Derrick Mata, [place] 1}    Referring MD: Derrick Mata, Derrick Mata*   Chief Complaint  Patient presents with   Follow-up  Acute on chronic CHF  History of Present Illness:    Derrick Mata is a 71 y.o. 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 Echocardiogram 03/23/18 showed LVEF 40-45%, grade 1 DD, biatrial enlargement, and dilated RV. Nuclear stress test Jan 2020 showed no ischemia. GDMT was titrated and repeat echo 03/2019 showed LVEF 35-40%, grade 1 DD, normal RV and no mention of biatrial enlargement, but ascending aorta 39 mm. He was last seen by Derrick Mata  06/2020 and had no cardiac complaints.   He presents today for routine follow up. He presents with excessive lower extremity swelling, but no progression of dyspnea. This started 3 months ago.  He was out of Entresto and started taking 40 mg lasix BID x 5 days.  He had some improvement in his lower extremity edema.  With further questioning, he has been out of entresto, was taking it only once per day for at least one month. He finally got a refill after he made today's appt. He accidentally missed his appt last week. Prior appointments interrupted due to trips.   Will re-instate entresto 24-26 mg BID. I did discuss hospitalization for IV diuresis. He would like to try medications at home first and I agree with this. He is not in distress, extremities warm. He is going to the beach for 1 week on Sunday but states they will cook at home. Discussed low sodium diet.    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 coma, chronic ([place]) 06/12/2015   treated with medications   Leg edema    Morbid obesity ([place])    Sleep apnea    refuses CPAP   Substance abuse ([place])    hx alcohol and cocaine use, 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, 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    Past Surgical History:  Procedure Laterality Date   COLONOSCOPY  04/18/2011   Procedure: COLONOSCOPY;  Surgeon: Derrick Shorts, MD;  Location: [facility];  Service: Endoscopy;  Laterality: N/A;   COLONOSCOPY WITH PROPOFOL N/A 05/07/2016   Procedure: COLONOSCOPY WITH PROPOFOL;  Surgeon: Derrick Shipper, MD;  Location: [facility];  Service: Endoscopy;  Laterality: N/A;   COLONOSCOPY WITH PROPOFOL N/A 08/12/2017   Procedure: COLONOSCOPY WITH PROPOFOL;  Surgeon: Derrick Shipper, MD;  Location: [facility];  Service: Endoscopy;  Laterality: N/A;   COLONOSCOPY WITH PROPOFOL N/A 01/25/2021   Procedure: COLONOSCOPY WITH PROPOFOL;  Surgeon: Derrick Shipper, MD;  Location: [facility];  Service: Endoscopy;  Laterality: N/A;   ESOPHAGOGASTRODUODENOSCOPY (EGD) WITH PROPOFOL N/A 05/07/2016   Procedure: ESOPHAGOGASTRODUODENOSCOPY (EGD) WITH PROPOFOL;  Surgeon: Derrick Shipper, MD;  Location: [facility];  Service: Endoscopy;  Laterality: N/A;   ESOPHAGOGASTRODUODENOSCOPY (EGD) WITH PROPOFOL N/A 01/25/2021   Procedure: ESOPHAGOGASTRODUODENOSCOPY (EGD) WITH PROPOFOL;  Surgeon: Derrick Shipper, MD;  Location: [facility];  Service: Endoscopy;  Laterality: N/A;   KNEE ARTHROSCOPY  2011   left   LUMBAR LAMINECTOMY  1994, 2010   POLYPECTOMY  01/25/2021   Procedure: POLYPECTOMY;  Surgeon: Derrick Shipper, MD;  Location: [facility];  Service: Endoscopy;;   TIBIA  FRACTURE SURGERY  2006   hardware   TONSILLECTOMY  1963    Current Medications: Current Meds  Medication Sig   aspirin EC 81 MG tablet Take 81 mg by mouth at bedtime.   buPROPion (WELLBUTRIN XL) 150 MG 24 hr tablet TAKE 1 TABLET(150 MG) BY MOUTH DAILY   Multiple Vitamin (MULTIVITAMIN WITH MINERALS) TABS tablet Take 1 tablet by mouth daily.   omeprazole (PRILOSEC) 20 MG capsule TAKE 1 CAPSULE(20 MG) BY MOUTH DAILY   traZODone (DESYREL) 150 MG tablet TAKE 1 TABLET(150 MG) BY MOUTH AT BEDTIME   [DISCONTINUED] furosemide (LASIX) 40 MG tablet TAKE 1 TABLET(40 MG) BY MOUTH DAILY   [DISCONTINUED] metoprolol succinate (TOPROL-XL) 25 MG 24 hr tablet TAKE 1 TABLET BY MOUTH DAILY SCHEDULE OFFICE VISIT FOR FUTURE REFILLS   [DISCONTINUED] rosuvastatin (CRESTOR) 40 MG tablet Take 1 tablet (40 mg total) by mouth daily.   [DISCONTINUED] sacubitril-valsartan (ENTRESTO) 24-26 MG TAKE 1 TABLET BY MOUTH TWICE DAILY. NEED OFFICE VISIT FOR MORE REFILLS   [DISCONTINUED] spironolactone (ALDACTONE) 25 MG tablet Take 1 tablet (25 mg total) by mouth daily.     Allergies:   Penicillins and Sulfamethoxazole   Social History   Socioeconomic History   Marital status: Married    Spouse name: Not on file   Number of children: 4   Years of education: Not on file   Highest education level: Not on file  Occupational History   Occupation: retired  Tobacco Use   Smoking status: Every Day    Packs/day: 1.00    Years: 51.00    Total pack years: 51.00    Types: Cigarettes    Start date: 05/21/1963   Smokeless tobacco: Never   Tobacco comments:    not ready to quit yet  Vaping Use   Vaping Use: Never used  Substance and Sexual Activity   Alcohol use: No    Alcohol/week: 0.0 standard drinks of alcohol   Drug use: No    Comment: last dose November 2012   Sexual activity: Yes    Partners: Female    Comment: states no alcohol in 3 weeks  Other Topics Concern   Not on file  Social History Narrative   Updated  06/12/15   Work or School: none, 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      Home Situation: lives with wife and grandson      Spiritual Beliefs: Derrick Mata, does not attend church      Lifestyle: no regular exercise, diet is not great but is interested in this      Social Determinants of Health   Financial Resource Strain: Low Risk  (02/15/2021)   Overall Financial Resource Strain (CARDIA)    Difficulty of Paying Living Expenses: Not very hard  Food Insecurity: Not on file  Transportation Needs: No Transportation Needs (02/15/2021)   [place] -  Hydrologist (Medical): No    Lack of Transportation (Non-Medical): No  Physical Activity: Not on file  Stress: Not on file  Social Connections: Not on file     Family History: The patient's family history includes Breast cancer in his mother; Cancer (age of onset: 76) in his sister; Colon cancer in his sister; Heart disease in an other family member; Heart disease (age of onset: 45) in his father. There is no history of Stomach cancer, Rectal cancer, Esophageal cancer, or Pancreatic cancer.  ROS:   Please see the history of present illness.     All other systems reviewed and are negative.  EKGs/Labs/Other Studies Reviewed:    The following studies were reviewed today:  Echo 04/08/19:  1. Left ventricular ejection fraction, by visual estimation, is 35 to  40%. The left ventricle has moderately decreased function. There is mildly  increased left ventricular hypertrophy.   2. Left ventricular diastolic parameters are consistent with Grade I  diastolic dysfunction (impaired relaxation).   3. Global right ventricle has normal systolic function.The right  ventricular size is normal. No increase in right ventricular wall  thickness.   4. Left atrial size was normal.   5. Right atrial size was normal.   6. The mitral valve is normal in structure. Trace mitral valve  regurgitation. No evidence of mitral stenosis.    7. The tricuspid valve is normal in structure. Tricuspid valve  regurgitation is trivial.   8. The aortic valve is tricuspid. Aortic valve regurgitation is not  visualized. Mild aortic valve sclerosis without stenosis.   9. The pulmonic valve was not well visualized. Pulmonic valve  regurgitation is not visualized.  10. Aortic dilatation noted.  11. There is mild dilatation of the ascending aorta measuring 39 mm.  12. The inferior vena cava is normal in size with greater than 50%  respiratory variability, suggesting right atrial pressure of 3 mmHg.  13. Moderate global reduction in LV systolic function; grade 1 diastolic  dysfunction; mild LVH; mildly dilated ascending aorta;.   EKG:  EKG is  ordered today.  The ekg ordered today demonstrates artifact, but appears sinus rhythm  Recent Labs: 11/01/2021: ALT 3; BUN 10; Creatinine, Ser 0.84; Hemoglobin 14.0; Platelets 284.0; Potassium 4.3; Sodium 138; TSH 1.77  Recent Lipid Panel    Component Value Date/Time   CHOL 107 11/01/2021 1357   TRIG 68.0 11/01/2021 1357   HDL 34.30 (L) 11/01/2021 1357   CHOLHDL 3 11/01/2021 1357   VLDL 13.6 11/01/2021 1357   LDLCALC 59 11/01/2021 1357     Risk Assessment/Calculations:                Physical Exam:    VS:  BP 116/86   Pulse 80   Ht '5\' 11"'$  (1.803 m)   Wt (!) 403 lb 6.4 oz (183 kg)   SpO2 94%   BMI 56.26 kg/m     Wt Readings from Last 3 Encounters:  01/30/22 (!) 403 lb 6.4 oz (183 kg)  11/01/21 (!) 401 lb 11.2 oz (182.2 kg)  04/10/21 (!) 421 lb 1.3 oz (191 kg)     GEN:  morbidly obese male in NAD HEENT: Normal NECK: No JVD LYMPHATICS: No lymphadenopathy CARDIAC: RRR, no murmurs, rubs, gallops RESPIRATORY:  Clear to auscultation without rales, wheezing or rhonchi  ABDOMEN: Soft, non-tender, non-distended MUSCULOSKELETAL:  significant at least 4+ pitting edema in LE SKIN: Warm and dry NEUROLOGIC:  Alert and oriented x 3  PSYCHIATRIC:  Normal affect   ASSESSMENT:    1.  Cardiomyopathy, nonischemic ([place])   2. Acute on chronic combined systolic (congestive) and diastolic (congestive) heart failure ([place])   3. Essential hypertension   4. Portal hypertension ([place])   5. Chronic right-sided heart failure ([place])   6. OSA and COPD overlap syndrome ([place])   7. Mixed hyperlipidemia    PLAN:    In order of problems listed above:  Acute on Chronic systolic and diastolic heart failure NICM Hypertension  - presumed nonischemic given nuclear stress test - GDMT: 40 mg lasix daily, 25 mg toprol, 24-26 mg entresto BID, 25 mg spironolactone - suspect exacerbation due to medication nonadherence  - restart entresto 24-26 mg twice daily continue 25 mg spironolactone -Take 80 mg Lasix every morning +40 mg Lasix every afternoon x7 days then take 80 mg of Lasix every morning and follow-up with me when he gets back from his trip on the 26th - I will hold off on repeating an echocardiogram for now but will have a low threshold to repeat if he does not respond to Lasix, edema may be complicated by history of cirrhosis   RV dysfunction  - noted in 2020 - Likely due to obesity and OSA   Hyperlipidemia 11/01/2021: Cholesterol 107; HDL 34.30; LDL Cholesterol 59; Triglycerides 68.0; VLDL 13.6 Continue 40 mg crestor, ASA   Follow up with me in 2 weeks with labs.      Medication Adjustments/Labs and Tests Ordered: Current medicines are reviewed at length with the patient today.  Concerns regarding medicines are outlined above.  Orders Placed This Encounter  Procedures   EKG 12-Lead   Meds ordered this encounter  Medications   spironolactone (ALDACTONE) 25 MG tablet    Sig: Take 1 tablet (25 mg total) by mouth daily.    Dispense:  90 tablet    Refill:  3   sacubitril-valsartan (ENTRESTO) 24-26 MG    Sig: TAKE 1 TABLET BY MOUTH TWICE DAILY.    Dispense:  180 tablet    Refill:  3   rosuvastatin (CRESTOR) 40 MG tablet    Sig: Take 1 tablet (40 mg total) by mouth daily.     Dispense:  90 tablet    Refill:  3   metoprolol succinate (TOPROL-XL) 25 MG 24 hr tablet    Sig: TAKE 1 TABLET BY MOUTH DAILY    Dispense:  90 tablet    Refill:  3   DISCONTD: furosemide (LASIX) 40 MG tablet    Sig: TAKE 1 TABLET(40 MG) BY MOUTH DAILY    Dispense:  60 tablet    Refill:  1   furosemide (LASIX) 40 MG tablet    Sig: TAKE 2 TABLETS(80 MG) BY MOUTH DAILY IN THE MORNING    Dispense:  180 tablet    Refill:  1    Patient Instructions  Medication Instructions:  Your physician has recommended you make the following change in your medication INCREASE: Lasix '80mg'$  daily in the morning (2 '40mg'$  tablets) Please take '80mg'$  of lasix in the morning and '40mg'$  of lasix in the evening for the next 7 days on day 8 please revert back to taking on '80mg'$  of lasix daily in the morning.  *If you need a refill on your cardiac medications before your next appointment, please call your pharmacy*  If you have worsening shortness of breath while you're on vacation please go to the [facility] emergency Room.    Lab Work: NONE ordered  at this time of appointment   If you have labs (blood work) drawn today and your tests are completely normal, you will receive your results only by: [place] (if you have MyChart) OR A paper copy in the mail If you have any lab test that is abnormal or we need to change your treatment, we will call you to review the results.   Testing/Procedures: NONE ordered at this time of appointment     Follow-Up: At [facility], you and your health needs are our priority.  As part of our continuing mission to provide you with exceptional heart care, we have created designated Provider Care Teams.  These Care Teams include your primary Cardiologist (physician) and Advanced Practice Providers (APPs -  Physician Assistants and Nurse Practitioners) who all work together to provide you with the care you need, when you need it.  We recommend signing up for the  patient portal called "MyChart".  Sign up information is provided on this After Visit Summary.  MyChart is used to connect with patients for Virtual Visits (Telemedicine).  Patients are able to view lab/test results, encounter notes, upcoming appointments, etc.  Non-urgent messages can be sent to your provider as well.   To learn more about what you can do with MyChart, go to [email or web address].    Other Instructions Please keep upcoming appointment.   Important Information About Sugar         Signed, Derrick Mata, [place]  01/30/2022 3:44 PM    [facility]

## 2022-01-30 ENCOUNTER — Ambulatory Visit: Payer: Medicare Other | Attending: Physician Assistant | Admitting: Physician Assistant

## 2022-01-30 ENCOUNTER — Encounter: Payer: Self-pay | Admitting: Physician Assistant

## 2022-01-30 VITALS — BP 116/86 | HR 80 | Ht 71.0 in | Wt >= 6400 oz

## 2022-01-30 DIAGNOSIS — J449 Chronic obstructive pulmonary disease, unspecified: Secondary | ICD-10-CM

## 2022-01-30 DIAGNOSIS — I428 Other cardiomyopathies: Secondary | ICD-10-CM | POA: Diagnosis not present

## 2022-01-30 DIAGNOSIS — G4733 Obstructive sleep apnea (adult) (pediatric): Secondary | ICD-10-CM

## 2022-01-30 DIAGNOSIS — I10 Essential (primary) hypertension: Secondary | ICD-10-CM

## 2022-01-30 DIAGNOSIS — K766 Portal hypertension: Secondary | ICD-10-CM | POA: Diagnosis not present

## 2022-01-30 DIAGNOSIS — E782 Mixed hyperlipidemia: Secondary | ICD-10-CM

## 2022-01-30 DIAGNOSIS — I5043 Acute on chronic combined systolic (congestive) and diastolic (congestive) heart failure: Secondary | ICD-10-CM | POA: Diagnosis not present

## 2022-01-30 DIAGNOSIS — I50812 Chronic right heart failure: Secondary | ICD-10-CM

## 2022-01-30 MED ORDER — ENTRESTO 24-26 MG PO TABS: ORAL_TABLET | ORAL | 3 refills | Status: DC

## 2022-01-30 MED ORDER — METOPROLOL SUCCINATE ER 25 MG PO TB24: ORAL_TABLET | ORAL | 3 refills | Status: DC

## 2022-01-30 MED ORDER — SPIRONOLACTONE 25 MG PO TABS: 25.0000 mg | ORAL_TABLET | Freq: Every day | ORAL | 3 refills | Status: DC

## 2022-01-30 MED ORDER — ROSUVASTATIN CALCIUM 40 MG PO TABS: 40.0000 mg | ORAL_TABLET | Freq: Every day | ORAL | 3 refills | Status: DC

## 2022-01-30 MED ORDER — FUROSEMIDE 40 MG PO TABS: ORAL_TABLET | ORAL | 1 refills | Status: DC

## 2022-01-30 NOTE — Patient Instructions (Signed)
Medication Instructions:  Your physician has recommended you make the following change in your medication INCREASE: Lasix '80mg'$  daily in the morning (2 '40mg'$  tablets) Please take '80mg'$  of lasix in the morning and '40mg'$  of lasix in the evening for the next 7 days on day 8 please revert back to taking on '80mg'$  of lasix daily in the morning.  *If you need a refill on your cardiac medications before your next appointment, please call your pharmacy*  If you have worsening shortness of breath while you're on vacation please go to the [facility] emergency Room.    Lab Work: NONE ordered at this time of appointment   If you have labs (blood work) drawn today and your tests are completely normal, you will receive your results only by: [place] (if you have MyChart) OR A paper copy in the mail If you have any lab test that is abnormal or we need to change your treatment, we will call you to review the results.   Testing/Procedures: NONE ordered at this time of appointment     Follow-Up: At [facility], you and your health needs are our priority.  As part of our continuing mission to provide you with exceptional heart care, we have created designated Provider Care Teams.  These Care Teams include your primary Cardiologist (physician) and Advanced Practice Providers (APPs -  Physician Assistants and Nurse Practitioners) who all work together to provide you with the care you need, when you need it.  We recommend signing up for the patient portal called "MyChart".  Sign up information is provided on this After Visit Summary.  MyChart is used to connect with patients for Virtual Visits (Telemedicine).  Patients are able to view lab/test results, encounter notes, upcoming appointments, etc.  Non-urgent messages can be sent to your provider as well.   To learn more about what you can do with MyChart, go to [email or web address].    Other Instructions Please keep upcoming appointment.    Important Information About Sugar

## 2022-02-11 NOTE — Progress Notes (Unsigned)
Cardiology Office Note:    Date:  02/12/2022   ID:  Derrick Mata, DOB 09/24/1950, MRN [id number]  PCP:  Isaac Bliss, Rayford Halsted, MD   [facility] Providers Cardiologist:  Kirk Ruths, MD Cardiology APP:  [name], [place] { Referring MD: Isaac Bliss, [place]*   Chief Complaint  Patient presents with   Follow-up    CHF    History of Present Illness:    Derrick Mata is a 71 y.o. 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 Echocardiogram 03/23/18 showed LVEF 40-45%, grade 1 DD, biatrial enlargement, and dilated RV. Nuclear stress test Jan 2020 showed no ischemia. GDMT was titrated and repeat echo 03/2019 showed LVEF 35-40%, grade 1 DD, normal RV and no mention of biatrial enlargement, but ascending aorta 39 mm. He was seen by Dr. Stanford Breed  06/2020 and had no cardiac complaints.  I saw him for follow up 01/30/22 with excessive lower extremity swelling but no progression of dyspnea. This started 3 months prior and coincided with him running out of medication and only taking entresto once daily. I re-started 24-26 mg entresto. I increased his lasix to 80 mg qAM and 40 mg qPM x 7 days, then 80 mg lasix in the morning.   He was not interested in IV diuresis in the hospital. He was about to leave for the beach and I discussed importance of low sodium diet.   He presents for close clinic follow up today. He did well at the beach.  Lower extremity swelling still present but much improved.  He continues to deny dyspnea, orthopnea, and PND.  He feels well overall.  Past Medical History:  Diagnosis Date   Arthritis    OA of knees   CHF (congestive heart failure) (HCC)    Closed compression fracture of L1 lumbar vertebra 09/03/2016   Colon polyp    Complication of anesthesia    slow to wake up   COPD (chronic obstructive pulmonary disease) (HCC)    > 30 pack yr smoker    Depression    Anxiety and Insomnia   Diverticulosis    Erectile dysfunction    GERD (gastroesophageal reflux disease)    Hemorrhoids    Hep C w/o coma, chronic ([place]) 06/12/2015   treated with medications   Leg edema    Morbid obesity ([place])    Sleep apnea    refuses CPAP   Substance abuse ([place])    hx alcohol and cocaine use, 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, 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    Past Surgical History:  Procedure Laterality Date   COLONOSCOPY  04/18/2011   Procedure: COLONOSCOPY;  Surgeon: Scarlette Shorts, MD;  Location: [facility];  Service: Endoscopy;  Laterality: N/A;   COLONOSCOPY WITH PROPOFOL N/A 05/07/2016   Procedure: COLONOSCOPY WITH PROPOFOL;  Surgeon: Irene Shipper, MD;  Location: [facility];  Service: Endoscopy;  Laterality: N/A;   COLONOSCOPY WITH PROPOFOL N/A 08/12/2017   Procedure: COLONOSCOPY WITH PROPOFOL;  Surgeon: Irene Shipper, MD;  Location: [facility];  Service: Endoscopy;  Laterality: N/A;   COLONOSCOPY WITH PROPOFOL N/A 01/25/2021   Procedure: COLONOSCOPY WITH PROPOFOL;  Surgeon: Irene Shipper, MD;  Location: [facility];  Service: Endoscopy;  Laterality: N/A;   ESOPHAGOGASTRODUODENOSCOPY (EGD) WITH PROPOFOL N/A 05/07/2016   Procedure: ESOPHAGOGASTRODUODENOSCOPY (EGD) WITH PROPOFOL;  Surgeon: Irene Shipper, MD;  [facility];  Service: Endoscopy;  Laterality: N/A;   ESOPHAGOGASTRODUODENOSCOPY (EGD) WITH PROPOFOL N/A 01/25/2021   Procedure: ESOPHAGOGASTRODUODENOSCOPY (EGD) WITH PROPOFOL;  Surgeon: Irene Shipper, MD;  Location: [facility];  Service: Endoscopy;  Laterality: N/A;   KNEE ARTHROSCOPY  2011   left   LUMBAR LAMINECTOMY  1994, 2010   POLYPECTOMY  01/25/2021   Procedure: POLYPECTOMY;  Surgeon: Irene Shipper, MD;  Location: [facility];  Service: Endoscopy;;   TIBIA FRACTURE SURGERY  2006   hardware   TONSILLECTOMY  1963    Current Medications: Current Meds  Medication Sig   aspirin EC 81 MG tablet Take 81 mg by mouth at bedtime.    furosemide (LASIX) 40 MG tablet TAKE 2 TABLETS(80 MG) BY MOUTH DAILY IN THE MORNING   metoprolol succinate (TOPROL-XL) 25 MG 24 hr tablet TAKE 1 TABLET BY MOUTH DAILY   Multiple Vitamin (MULTIVITAMIN WITH MINERALS) TABS tablet Take 1 tablet by mouth daily.   omeprazole (PRILOSEC) 20 MG capsule TAKE 1 CAPSULE(20 MG) BY MOUTH DAILY   rosuvastatin (CRESTOR) 40 MG tablet Take 1 tablet (40 mg total) by mouth daily.   sacubitril-valsartan (ENTRESTO) 24-26 MG TAKE 1 TABLET BY MOUTH TWICE DAILY.   spironolactone (ALDACTONE) 25 MG tablet Take 1 tablet (25 mg total) by mouth daily.   traZODone (DESYREL) 150 MG tablet TAKE 1 TABLET(150 MG) BY MOUTH AT BEDTIME     Allergies:   Penicillins and Sulfamethoxazole   Social History   Socioeconomic History   Marital status: Married    Spouse name: Not on file   Number of children: 4   Years of education: Not on file   Highest education level: Not on file  Occupational History   Occupation: retired  Tobacco Use   Smoking status: Every Day    Packs/day: 1.00    Years: 51.00    Total pack years: 51.00    Types: Cigarettes    Start date: 05/21/1963   Smokeless tobacco: Never   Tobacco comments:    not ready to quit yet  Vaping Use   Vaping Use: Never used  Substance and Sexual Activity   Alcohol use: No    Alcohol/week: 0.0 standard drinks of alcohol   Drug use: No    Comment: last dose November 2012   Sexual activity: Yes    Partners: Female    Comment: states no alcohol in 3 weeks  Other Topics Concern   Not on file  Social History Narrative   Updated 06/12/15   Work or School: none, 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      Home Situation: lives with wife and grandson      Spiritual Beliefs: [name], does not attend church      Lifestyle: no regular exercise, diet is not great but is interested in this      Social Determinants of Health   Financial Resource Strain: Low Risk  (02/15/2021)   Overall Financial Resource Strain  (CARDIA)    Difficulty of Paying Living Expenses: Not very hard  Food Insecurity: Not on file  Transportation Needs: No Transportation Needs (02/15/2021)   PRAPARE - Transportation    Lack of Transportation (Medical): No    Lack of Transportation (Non-Medical): No  Physical Activity: Not on file  Stress: Not on file  Social Connections: Not on file     Family History: The patient's family history includes Breast cancer in his mother; Cancer (age of onset: 56) in his sister; Colon cancer  in his sister; Heart disease in an other family member; Heart disease (age of onset: 80) in his father. There is no history of Stomach cancer, Rectal cancer, Esophageal cancer, or Pancreatic cancer.  ROS:   Please see the history of present illness.     All other systems reviewed and are negative.  EKGs/Labs/Other Studies Reviewed:    The following studies were reviewed today:  Echo 04/08/19:  1. Left ventricular ejection fraction, by visual estimation, is 35 to  40%. The left ventricle has moderately decreased function. There is mildly  increased left ventricular hypertrophy.   2. Left ventricular diastolic parameters are consistent with Grade I  diastolic dysfunction (impaired relaxation).   3. Global right ventricle has normal systolic function.The right  ventricular size is normal. No increase in right ventricular wall  thickness.   4. Left atrial size was normal.   5. Right atrial size was normal.   6. The mitral valve is normal in structure. Trace mitral valve  regurgitation. No evidence of mitral stenosis.   7. The tricuspid valve is normal in structure. Tricuspid valve  regurgitation is trivial.   8. The aortic valve is tricuspid. Aortic valve regurgitation is not  visualized. Mild aortic valve sclerosis without stenosis.   9. The pulmonic valve was not well visualized. Pulmonic valve  regurgitation is not visualized.  10. Aortic dilatation noted.  11. There is mild dilatation of the  ascending aorta measuring 39 mm.  12. The inferior vena cava is normal in size with greater than 50%  respiratory variability, suggesting right atrial pressure of 3 mmHg.  13. Moderate global reduction in LV systolic function; grade 1 diastolic  dysfunction; mild LVH; mildly dilated ascending aorta;.     EKG:  EKG is not ordered today.    Recent Labs: 11/01/2021: ALT 3; BUN 10; Creatinine, Ser 0.84; Hemoglobin 14.0; Platelets 284.0; Potassium 4.3; Sodium 138; TSH 1.77  Recent Lipid Panel    Component Value Date/Time   CHOL 107 11/01/2021 1357   TRIG 68.0 11/01/2021 1357   HDL 34.30 (L) 11/01/2021 1357   CHOLHDL 3 11/01/2021 1357   VLDL 13.6 11/01/2021 1357   LDLCALC 59 11/01/2021 1357     Risk Assessment/Calculations:                Physical Exam:    VS:  BP 128/84   Pulse (!) 102   Ht 6' (1.829 m)   Wt (!) 404 lb (183.3 kg)   SpO2 97%   BMI 54.79 kg/m     Wt Readings from Last 3 Encounters:  02/12/22 (!) 404 lb (183.3 kg)  01/30/22 (!) 403 lb 6.4 oz (183 kg)  11/01/21 (!) 401 lb 11.2 oz (182.2 kg)     GEN: obese male in NAD HEENT: Normal NECK: No JVD; No carotid bruits LYMPHATICS: No lymphadenopathy CARDIAC: RRR, no murmurs, rubs, gallops RESPIRATORY:  Clear to auscultation without rales, wheezing or rhonchi  ABDOMEN: Soft, non-tender, non-distended MUSCULOSKELETAL:  B LE edema, improved, still 2+  SKIN: Warm and dry NEUROLOGIC:  Alert and oriented x 3 PSYCHIATRIC:  Normal affect   ASSESSMENT:    1. Chronic combined systolic (congestive) and diastolic (congestive) heart failure (HCC)   2. Cardiomyopathy, nonischemic ([place])   3. Essential hypertension   4. Chronic right-sided heart failure ([place])   5. OSA and COPD overlap syndrome ([place])   6. Portal hypertension (HCC)   7. Mixed hyperlipidemia    PLAN:    In order of problems  listed above:  Chronic systolic and diastolic heart failure NICM Hypertension  - presumed nonischemic given nuclear stress  test - GDMT: 80 mg lasix daily, 25 mg toprol, 24-26 mg entresto BID, 25 mg spironolactone - was out of medication precipitating recent exacerbation - restarted low dose entresto and escalated lasix for 7 days  - continued spironolactone - edema felt may be complicated by history of cirrhosis - low threshold to repeat an echo vs IV diuresis - will keep on 80 mg lasix in the morning   RV dysfunction  - noted in 2020 - Likely due to obesity and OSA   Hyperlipidemia 11/01/2021: Cholesterol 107; HDL 34.30; LDL Cholesterol 59; Triglycerides 68.0; VLDL 13.6 Continue 40 mg crestor, ASA     Follow up in 3 months.   Medication Adjustments/Labs and Tests Ordered: Current medicines are reviewed at length with the patient today.  Concerns regarding medicines are outlined above.  Orders Placed This Encounter  Procedures   Basic metabolic panel   No orders of the defined types were placed in this encounter.   Patient Instructions  Medication Instructions:  Continue Lasix 80 mg. Take an extra 40 mg of lasix for swelling when needed.  *If you need a refill on your cardiac medications before your next appointment, please call your pharmacy*   Lab Work: Your physician recommends that you complete labs today BMET  If you have labs (blood work) drawn today and your tests are completely normal, you will receive your results only by: MyChart Message (if you have MyChart) OR A paper copy in the mail If you have any lab test that is abnormal or we need to change your treatment, we will call you to review the results.   Testing/Procedures: NONE ordered at this time of appointment     Follow-Up: At [facility], you and your health needs are our priority.  As part of our continuing mission to provide you with exceptional heart care, we have created designated Provider Care Teams.  These Care Teams include your primary Cardiologist (physician) and Advanced Practice Providers (APPs -   Physician Assistants and Nurse Practitioners) who all work together to provide you with the care you need, when you need it.  We recommend signing up for the patient portal called "MyChart".  Sign up information is provided on this After Visit Summary.  MyChart is used to connect with patients for Virtual Visits (Telemedicine).  Patients are able to view lab/test results, encounter notes, upcoming appointments, etc.  Non-urgent messages can be sent to your provider as well.   To learn more about what you can do with MyChart, go to [email or web address].    Your next appointment:   3 month(s)  The format for your next appointment:   In Person  Provider:   Fabian Sharp, PA-C        Other Instructions   Important Information About Sugar         Signed, [name], [place]  02/12/2022 1:30 PM    [facility]

## 2022-02-12 ENCOUNTER — Encounter: Payer: Self-pay | Admitting: Physician Assistant

## 2022-02-12 ENCOUNTER — Ambulatory Visit: Payer: Medicare Other | Attending: Physician Assistant | Admitting: Physician Assistant

## 2022-02-12 VITALS — BP 128/84 | HR 102 | Ht 72.0 in | Wt >= 6400 oz

## 2022-02-12 DIAGNOSIS — I50812 Chronic right heart failure: Secondary | ICD-10-CM

## 2022-02-12 DIAGNOSIS — I428 Other cardiomyopathies: Secondary | ICD-10-CM

## 2022-02-12 DIAGNOSIS — G4733 Obstructive sleep apnea (adult) (pediatric): Secondary | ICD-10-CM

## 2022-02-12 DIAGNOSIS — E782 Mixed hyperlipidemia: Secondary | ICD-10-CM

## 2022-02-12 DIAGNOSIS — J449 Chronic obstructive pulmonary disease, unspecified: Secondary | ICD-10-CM | POA: Diagnosis not present

## 2022-02-12 DIAGNOSIS — I10 Essential (primary) hypertension: Secondary | ICD-10-CM | POA: Diagnosis not present

## 2022-02-12 DIAGNOSIS — I5042 Chronic combined systolic (congestive) and diastolic (congestive) heart failure: Secondary | ICD-10-CM

## 2022-02-12 DIAGNOSIS — K766 Portal hypertension: Secondary | ICD-10-CM | POA: Diagnosis not present

## 2022-02-12 DIAGNOSIS — I5043 Acute on chronic combined systolic (congestive) and diastolic (congestive) heart failure: Secondary | ICD-10-CM | POA: Diagnosis not present

## 2022-02-12 NOTE — Patient Instructions (Signed)
Medication Instructions:  Continue Lasix 80 mg. Take an extra 40 mg of lasix for swelling when needed.  *If you need a refill on your cardiac medications before your next appointment, please call your pharmacy*   Lab Work: Your physician recommends that you complete labs today BMET  If you have labs (blood work) drawn today and your tests are completely normal, you will receive your results only by: MyChart Message (if you have MyChart) OR A paper copy in the mail If you have any lab test that is abnormal or we need to change your treatment, we will call you to review the results.   Testing/Procedures: NONE ordered at this time of appointment     Follow-Up: At [facility], you and your health needs are our priority.  As part of our continuing mission to provide you with exceptional heart care, we have created designated Provider Care Teams.  These Care Teams include your primary Cardiologist (physician) and Advanced Practice Providers (APPs -  Physician Assistants and Nurse Practitioners) who all work together to provide you with the care you need, when you need it.  We recommend signing up for the patient portal called "MyChart".  Sign up information is provided on this After Visit Summary.  MyChart is used to connect with patients for Virtual Visits (Telemedicine).  Patients are able to view lab/test results, encounter notes, upcoming appointments, etc.  Non-urgent messages can be sent to your provider as well.   To learn more about what you can do with MyChart, go to [email or web address].    Your next appointment:   3 month(s)  The format for your next appointment:   In Person  Provider:   Fabian Sharp, PA-C        Other Instructions   Important Information About Sugar

## 2022-02-13 LAB — BASIC METABOLIC PANEL
BUN/Creatinine Ratio: 11 (ref 10–24)
BUN: 10 mg/dL (ref 8–27)
CO2: 24 mmol/L (ref 20–29)
Calcium: 9.3 mg/dL (ref 8.6–10.2)
Chloride: 100 mmol/L (ref 96–106)
Creatinine, Ser: 0.89 mg/dL (ref 0.76–1.27)
Glucose: 89 mg/dL (ref 70–99)
Potassium: 3.9 mmol/L (ref 3.5–5.2)
Sodium: 142 mmol/L (ref 134–144)
eGFR: 90 mL/min/{1.73_m2} (ref >59)

## 2022-02-21 ENCOUNTER — Telehealth: Payer: Self-pay | Admitting: Pharmacist

## 2022-02-21 ENCOUNTER — Encounter: Payer: Self-pay | Admitting: *Deleted

## 2022-02-21 NOTE — Chronic Care Management (AMB) (Signed)
Chronic Care Management Pharmacy Assistant   Name: Derrick Mata  MRN: [id number] DOB: [birth year]  Reason for Encounter: Disease State   Conditions to be addressed/monitored: HTN  Recent office visits:  None   Recent consult visits:  01/06/22 Derrick Mata, Derrick (Cardiology) - Patient presented for Chronic Combined systolic (congestive and diastolic (Congestive) heart failure and other concerns. Stopped Cordicidin.  01/30/22 Mata, Derrick Lin, PA (Cardiology) - Patient presented for Cardiomyopathy nonischemic and other concerns.Increased Furosemide   Hospital visits:  None in previous 6 months  Medications: Outpatient Encounter Medications as of 02/21/2022  Medication Sig   albuterol (VENTOLIN HFA) 108 (90 Base) MCG/ACT inhaler Inhale 2 puffs into the lungs every 6 (six) hours as needed for wheezing or shortness of breath. (Patient not taking: Reported on 01/30/2022)   aspirin EC 81 MG tablet Take 81 mg by mouth at bedtime.   Blood Pressure Monitoring (ADULT BLOOD PRESSURE CUFF LG) KIT Use daily to monitor blood pressure (Patient not taking: Reported on 01/30/2022)   buPROPion (WELLBUTRIN XL) 150 MG 24 hr tablet TAKE 1 TABLET(150 MG) BY MOUTH DAILY   furosemide (LASIX) 40 MG tablet TAKE 2 TABLETS(80 MG) BY MOUTH DAILY IN THE MORNING   metoprolol succinate (TOPROL-XL) 25 MG 24 hr tablet TAKE 1 TABLET BY MOUTH DAILY   Multiple Vitamin (MULTIVITAMIN WITH MINERALS) TABS tablet Take 1 tablet by mouth daily.   omeprazole (PRILOSEC) 20 MG capsule TAKE 1 CAPSULE(20 MG) BY MOUTH DAILY   rosuvastatin (CRESTOR) 40 MG tablet Take 1 tablet (40 mg total) by mouth daily.   sacubitril-valsartan (ENTRESTO) 24-26 MG TAKE 1 TABLET BY MOUTH TWICE DAILY.   spironolactone (ALDACTONE) 25 MG tablet Take 1 tablet (25 mg total) by mouth daily.   traZODone (DESYREL) 150 MG tablet TAKE 1 TABLET(150 MG) BY MOUTH AT BEDTIME   No facility-administered encounter medications on file as of 02/21/2022.   Reviewed chart prior to disease state call. Spoke with patient regarding BP  Recent Office Vitals: BP Readings from Last 3 Encounters:  02/12/22 128/84  01/30/22 116/86  11/01/21 120/70   Pulse Readings from Last 3 Encounters:  02/12/22 (!) 102  01/30/22 80  11/01/21 70    Wt Readings from Last 3 Encounters:  02/12/22 (!) 404 lb (183.3 kg)  01/30/22 (!) 403 lb 6.4 oz (183 kg)  11/01/21 (!) 401 lb 11.2 oz (182.2 kg)     Kidney Function Lab Results  Component Value Date/Time   CREATININE 0.89 02/12/2022 12:10 PM   CREATININE 0.84 11/01/2021 01:57 PM   CREATININE 0.94 10/03/2016 09:59 AM   CREATININE 0.81 10/18/2015 10:37 AM   GFR 86.93 11/01/2021 01:57 PM   GFRNONAA 78 07/04/2020 11:59 AM   GFRNONAA 84 10/03/2016 09:59 AM   GFRAA 90 07/04/2020 11:59 AM   GFRAA >89 10/03/2016 09:59 AM       Latest Ref Rng & Units 02/12/2022   12:10 PM 11/01/2021    1:57 PM 10/27/2020   11:05 AM  BMP  Glucose 70 - 99 mg/dL 89  87  85   BUN 8 - 27 mg/dL _0 Creatinine 0.76 - 1.27 mg/dL 0.89  0.84  0.88   BUN/Creat Ratio 10 - 24 11     Sodium 134 - 144 mmol/L 142  138  141   Potassium 3.5 - 5.2 mmol/L 3.9  4.3  4.9   Chloride 96 - 106 mmol/L 100  104  105   CO2  20 - 29 mmol/L _0 Calcium 8.6 - 10.2 mg/dL 9.3  8.7  9.1     Current antihypertensive regimen:  Entresto 24-26 mg 1 tablet twice daily - Appropriate, Effective, Safe, Accessible Spironolactone 25 mg 1 tablet daily - Appropriate, Effective, Safe, Accessible Metoprolol succinate 25 mg 1 tablet in the evening - Appropriate, Effective, Safe, Accessible Furosemide 40 mg 2 tablet daily - Appropriate, Effective, Safe, Accessible  Unable to reach for assessment and to confirm pt completion of pt assistance.  Care Gaps: COVID Booster - Overdue Flu Vaccine - Overdue CCM- 1/24 BP- 128/84 02/12/22 AWV- 6/23 Lab Results  Component Value Date   HGBA1C 6.1 11/01/2021    Star Rating Drugs: Sacubitril-Valsartan  (Entresto) 24-26 MG - Last filled 02/02/22 90 DS at [facility]  Rosuvastatin (Crestor) 40 mg - Last filled 11/22/21 90 DS at [facility] [place] - has not received application as of 57/9728  [place] Pharmacist Assistant [phone]

## 2022-03-18 DIAGNOSIS — H47021 Hemorrhage in optic nerve sheath, right eye: Secondary | ICD-10-CM | POA: Diagnosis not present

## 2022-03-18 DIAGNOSIS — H35363 Drusen (degenerative) of macula, bilateral: Secondary | ICD-10-CM | POA: Diagnosis not present

## 2022-03-18 DIAGNOSIS — H524 Presbyopia: Secondary | ICD-10-CM | POA: Diagnosis not present

## 2022-03-18 DIAGNOSIS — H35033 Hypertensive retinopathy, bilateral: Secondary | ICD-10-CM | POA: Diagnosis not present

## 2022-03-18 DIAGNOSIS — H2513 Age-related nuclear cataract, bilateral: Secondary | ICD-10-CM | POA: Diagnosis not present

## 2022-03-18 NOTE — Progress Notes (Signed)
Received call from patient reporting he never got the patient assistance application for [facility], he reports he has about a month supply left as of now and the pharmacy told him the next fill will be over 200.00 as he will be in the donut hole. Advised him to be on the lookout for a new application that will be needing completion and financial information from him to take to the prescribers office. Patient aware and in agreement, plan to follow up with him on receipt.

## 2022-03-29 ENCOUNTER — Telehealth: Payer: Self-pay | Admitting: Pharmacist

## 2022-03-29 NOTE — Telephone Encounter (Signed)
Patient called me as he is out of some of his medications and possibly needing refills. [place] to confirm and he needs a refill on the omeprazole and they were able to refill the rosuvastatin, spironolactone, and bupriopion. Will request from PCP.

## 2022-04-01 MED ORDER — OMEPRAZOLE 20 MG PO CPDR: DELAYED_RELEASE_CAPSULE | ORAL | 1 refills | Status: DC

## 2022-04-01 NOTE — Telephone Encounter (Signed)
Refill sent.

## 2022-04-27 ENCOUNTER — Other Ambulatory Visit: Payer: Self-pay | Admitting: Internal Medicine

## 2022-04-27 DIAGNOSIS — G4700 Insomnia, unspecified: Secondary | ICD-10-CM

## 2022-05-06 ENCOUNTER — Ambulatory Visit: Payer: Medicare Other | Admitting: Internal Medicine

## 2022-05-29 NOTE — Progress Notes (Deleted)
Cardiology Office Note:    Date:  05/29/2022   ID:  Derrick Mata, DOB 11/10/1950, MRN [id number]  PCP:  Derrick Mata, Derrick Halsted, MD   [place] Prov[facility] Cardiologist:  Derrick Ruths, MD Cardiology APP:  Derrick Mata, [place] {}    Referring MD: Derrick Mata, Derrick Mata*   No chief complaint on file. ***  History of Present Illness:    Derrick Mata is a 72 y.o. 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 Echocardiogram 03/23/18 showed LVEF 40-45%, grade 1 DD, biatrial enlargement, and dilated RV. Nuclear stress test Jan 2020 showed no ischemia. GDMT was titrated and repeat echo 03/2019 showed LVEF 35-40%, grade 1 DD, normal RV and no mention of biatrial enlargement, but ascending aorta 39 mm. He was seen by Dr. Stanford Mata  06/2020 and had no cardiac complaints.  I saw him for follow up 01/30/22 with excessive lower extremity swelling but no progression of dyspnea. This started 3 months prior and coincided with him running out of medication and only taking entresto once daily. I re-started 24-26 mg entresto. I increased his lasix to 80 mg qAM and 40 mg qPM x 7 days, then 80 mg lasix in the morning.   He was not interested in IV diuresis in the hospital. He was about to leave for the beach and I discussed importance of low sodium diet. I saw him back 02/12/22 - he did well at the beach. Lower extremity edema improved. BMP was stable.  He presents for clinic follow up today.     Acute on Chronic systolic and diastolic heart failure NICM Hypertension  - presumed nonischemic given nuclear stress test - GDMT: 40 mg lasix daily, 25 mg toprol, 24-26 mg entresto BID, 25 mg spironolactone - was out of medication precipitating current exacerbation - restarted low dose entresto and escalated lasix for 7 days  - continued spironolactone - edema felt may be complicated by history  of cirrhosis - low threshold to repeat an echo vs IV diuresis - continued on 80 mg lasix qAM    RV dysfunction  - noted in 2020 - Likely due to obesity and OSA   Hyperlipidemia 11/01/2021: Cholesterol 107; HDL 34.30; LDL Cholesterol 59; Triglycerides 68.0; VLDL 13.6 Continue 40 mg crestor, ASA           Past Medical History:  Diagnosis Date   Arthritis    OA of knees   CHF (congestive heart failure) (HCC)    Closed compression fracture of L1 lumbar vertebra 09/03/2016   Colon polyp    Complication of anesthesia    slow to wake up   COPD (chronic obstructive pulmonary disease) (HCC)    > 30 pack yr smoker   Depression    Anxiety and Insomnia   Diverticulosis    Erectile dysfunction    GERD (gastroesophageal reflux disease)    Hemorrhoids    Hep C w/o coma, chronic ([place]) 06/12/2015   treated with medications   Leg edema    Morbid obesity ([place])    Sleep apnea    refuses CPAP   Substance abuse ([place])    hx alcohol and cocaine use, 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, 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    Past Surgical History:  Procedure Laterality Date   COLONOSCOPY  04/18/2011   Procedure: COLONOSCOPY;  Surgeon: Derrick Shorts, MD;  [facility]: [facility];  Service: Endoscopy;  Laterality: N/A;   COLONOSCOPY WITH PROPOFOL N/A 05/07/2016   Procedure: COLONOSCOPY WITH PROPOFOL;  Surgeon: Derrick Shipper, MD;  [facility];  Service: Endoscopy;  Laterality: N/A;   COLONOSCOPY WITH PROPOFOL N/A 08/12/2017   Procedure: COLONOSCOPY WITH PROPOFOL;  Surgeon: Derrick Shipper, MD;  [facility];  Service: Endoscopy;  Laterality: N/A;   COLONOSCOPY WITH PROPOFOL N/A 01/25/2021   Procedure: COLONOSCOPY WITH PROPOFOL;  Surgeon: Derrick Shipper, MD;  [facility];  Service: Endoscopy;  Laterality: N/A;   ESOPHAGOGASTRODUODENOSCOPY (EGD) WITH PROPOFOL N/A 05/07/2016   Procedure: ESOPHAGOGASTRODUODENOSCOPY (EGD) WITH PROPOFOL;  Surgeon: Derrick Shipper, MD;  [facility];  Service: Endoscopy;  Laterality: N/A;   ESOPHAGOGASTRODUODENOSCOPY (EGD) WITH PROPOFOL N/A 01/25/2021   Procedure: ESOPHAGOGASTRODUODENOSCOPY (EGD) WITH PROPOFOL;  Surgeon: Derrick Shipper, MD;  [facility];  Service: Endoscopy;  Laterality: N/A;   KNEE ARTHROSCOPY  2011   left   LUMBAR LAMINECTOMY  1994, 2010   POLYPECTOMY  01/25/2021   Procedure: POLYPECTOMY;  Surgeon: Derrick Shipper, MD;  [facility];  Service: Endoscopy;;   TIBIA FRACTURE SURGERY  2006   hardware   TONSILLECTOMY  1963    Current Medications: No outpatient medications have been marked as taking for the 06/06/22 encounter (Appointment) with Derrick Mata, [place].     Allergies:   Penicillins and Sulfamethoxazole   Social History   Socioeconomic History   Marital status: Married    Spouse name: Not on file   Number of children: 4   Years of education: Not on file   Highest education level: Not on file  Occupational History   Occupation: retired  Tobacco Use   Smoking status: Every Day    Packs/day: 1.00    Years: 51.00    Total pack years: 51.00    Types: Cigarettes    Start date: 05/21/1963   Smokeless tobacco: Never   Tobacco comments:    not ready to quit yet  Vaping Use   Vaping Use: Never used  Substance and Sexual Activity   Alcohol use: No    Alcohol/week: 0.0 standard drinks of alcohol   Drug use: No    Comment: last dose November 2012   Sexual activity: Yes    Partners: Female    Comment: states no alcohol in 3 weeks  Other Topics Concern   Not on file  Social History Narrative   Updated 06/12/15   Work or School: none, 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      Home Situation: lives with wife and grandson      Spiritual Beliefs: Derrick Mata, does not attend church      Lifestyle: no regular exercise, diet is not great but is interested in this      Social Determinants of Health   Financial Resource Strain: Low Risk  (02/15/2021)   Overall Financial  Resource Strain (CARDIA)    Difficulty of Paying Living Expenses: Not very hard  Food Insecurity: Not on file  Transportation Needs: No Transportation Needs (02/15/2021)   PRAPARE - Transportation    Lack of Transportation (Medical): No    Lack of Transportation (Non-Medical): No  Physical Activity: Not on file  Stress: Not on file  Social Connections: Not on file     Family History: The patient's ***family history includes Breast cancer in his mother; Cancer (age of onset: 82) in his sister; Colon cancer in his sister; Heart disease in an  other family member; Heart disease (age of onset: 34) in his father. There is no history of Stomach cancer, Rectal cancer, Esophageal cancer, or Pancreatic cancer.  ROS:   Please see the history of present illness.    *** All other systems reviewed and are negative.  EKGs/Labs/Other Studies Reviewed:    The following studies were reviewed today: ***  EKG:  EKG is *** ordered today.  The ekg ordered today demonstrates ***  Recent Labs: 11/01/2021: ALT 3; Hemoglobin 14.0; Platelets 284.0; TSH 1.77 02/12/2022: BUN 10; Creatinine, Ser 0.89; Potassium 3.9; Sodium 142  Recent Lipid Panel    Component Value Date/Time   CHOL 107 11/01/2021 1357   TRIG 68.0 11/01/2021 1357   HDL 34.30 (L) 11/01/2021 1357   CHOLHDL 3 11/01/2021 1357   VLDL 13.6 11/01/2021 1357   LDLCALC 59 11/01/2021 1357     Risk Assessment/Calculations:   {Does this patient have ATRIAL FIBRILLATION?[phone]}  No BP recorded.  {Refresh Note OR Click here to enter BP  :1}***         Physical Exam:    VS:  There were no vitals taken for this visit.    Wt Readings from Last 3 Encounters:  02/12/22 (!) 404 lb (183.3 kg)  01/30/22 (!) 403 lb 6.4 oz (183 kg)  11/01/21 (!) 401 lb 11.2 oz (182.2 kg)     GEN: *** Well nourished, well developed in no acute distress HEENT: Normal NECK: No JVD; No carotid bruits LYMPHATICS: No lymphadenopathy CARDIAC: ***RRR, no murmurs,  rubs, gallops RESPIRATORY:  Clear to auscultation without rales, wheezing or rhonchi  ABDOMEN: Soft, non-tender, non-distended MUSCULOSKELETAL:  No edema; No deformity  SKIN: Warm and dry NEUROLOGIC:  Alert and oriented x 3 PSYCHIATRIC:  Normal affect   ASSESSMENT:    No diagnosis found. PLAN:    In order of problems listed above:  ***      {Are you ordering a CV Procedure (e.g. stress test, cath, DCCV, TEE, etc)?   Press F2        :[id number]    Medication Adjustments/Labs and Tests Ordered: Current medicines are reviewed at length with the patient today.  Concerns regarding medicines are outlined above.  No orders of the defined types were placed in this encounter.  No orders of the defined types were placed in this encounter.   There are no Patient Instructions on file for this visit.   Signed, Derrick Bottcher, PA  05/29/2022 3:17 PM    [place]

## 2022-06-06 ENCOUNTER — Ambulatory Visit: Payer: Medicare Other | Admitting: Physician Assistant

## 2022-06-10 ENCOUNTER — Telehealth: Payer: Medicare Other

## 2022-06-17 ENCOUNTER — Ambulatory Visit: Payer: Medicare Other | Attending: Physician Assistant | Admitting: Nurse Practitioner

## 2022-06-17 NOTE — Progress Notes (Deleted)
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  Past Medical History    Past Medical History:  Diagnosis Date   Arthritis    OA of knees   CHF (congestive heart failure) (HCC)    Closed compression fracture of L1 lumbar vertebra 09/03/2016   Colon polyp    Complication of anesthesia    slow to wake up   COPD (chronic obstructive pulmonary disease) (HCC)    > 30 pack yr smoker   Depression    Anxiety and Insomnia   Diverticulosis    Erectile dysfunction    GERD (gastroesophageal reflux disease)    Hemorrhoids    Hep C w/o coma, chronic ([place]) 06/12/2015   treated with medications   Leg edema    Morbid obesity ([place])    Sleep apnea    refuses CPAP   Substance abuse ([place])    hx alcohol and cocaine use, 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, 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   Past Surgical History:  Procedure Laterality Date   COLONOSCOPY  04/18/2011   Procedure: COLONOSCOPY;  Surgeon: Scarlette Shorts, MD;  [facility];  Service: Endoscopy;  Laterality: N/A;   COLONOSCOPY WITH PROPOFOL N/A 05/07/2016   Procedure: COLONOSCOPY WITH PROPOFOL;  Surgeon: Irene Shipper, MD;  [facility];  Service: Endoscopy;  Laterality: N/A;   COLONOSCOPY WITH PROPOFOL N/A 08/12/2017   Procedure: COLONOSCOPY WITH PROPOFOL;  Surgeon: Irene Shipper, MD;  [facility];  Service: Endoscopy;  Laterality: N/A;   COLONOSCOPY WITH PROPOFOL N/A 01/25/2021   Procedure: COLONOSCOPY WITH PROPOFOL;  Surgeon: Irene Shipper, MD;  [facility];  Service: Endoscopy;  Laterality: N/A;    ESOPHAGOGASTRODUODENOSCOPY (EGD) WITH PROPOFOL N/A 05/07/2016   Procedure: ESOPHAGOGASTRODUODENOSCOPY (EGD) WITH PROPOFOL;  Surgeon: Irene Shipper, MD;  [facility];  Service: Endoscopy;  Laterality: N/A;   ESOPHAGOGASTRODUODENOSCOPY (EGD) WITH PROPOFOL N/A 01/25/2021   Procedure: ESOPHAGOGASTRODUODENOSCOPY (EGD) WITH PROPOFOL;  Surgeon: Irene Shipper, MD;  [facility];  Service: Endoscopy;  Laterality: N/A;   KNEE ARTHROSCOPY  2011   left   LUMBAR LAMINECTOMY  1994, 2010   POLYPECTOMY  01/25/2021   Procedure: POLYPECTOMY;  Surgeon: Irene Shipper, MD;  [facility];  Service: Endoscopy;;   TIBIA FRACTURE SURGERY  2006   hardware   TONSILLECTOMY  1963    Allergies  Allergies  Allergen Reactions   Penicillins Anaphylaxis, Nausea Only and Other (See Comments)    Passed out. Has patient had a PCN reaction causing immediate rash, facial/tongue/throat swelling, SOB or lightheadedness with hypotension: {no Has patient had a PCN reaction causing severe rash involving mucus membranes or skin necrosis: {no Has patient had a PCN reaction that required hospitalization {no Has patient had a PCN reaction occurring within the last 10 years: {no If all of the above answers are "NO", then may proceed with Cephalosporin use.   Sulfamethoxazole Hives     Labs/Other Studies Reviewed    The following studies were reviewed today: Echo 04/08/19:  1. Left ventricular ejection fraction, by visual estimation, is 35 to  40%. The left ventricle  has moderately decreased function. There is mildly  increased left ventricular hypertrophy.   2. Left ventricular diastolic parameters are consistent with Grade I  diastolic dysfunction (impaired relaxation).   3. Global right ventricle has normal systolic function.The right  ventricular size is normal. No increase in right ventricular wall  thickness.   4. Left atrial size was normal.   5. Right atrial size was normal.   6. The  mitral valve is normal in structure. Trace mitral valve  regurgitation. No evidence of mitral stenosis.   7. The tricuspid valve is normal in structure. Tricuspid valve  regurgitation is trivial.   8. The aortic valve is tricuspid. Aortic valve regurgitation is not  visualized. Mild aortic valve sclerosis without stenosis.   9. The pulmonic valve was not well visualized. Pulmonic valve  regurgitation is not visualized.  10. Aortic dilatation noted.  11. There is mild dilatation of the ascending aorta measuring 39 mm.  12. The inferior vena cava is normal in size with greater than 50%  respiratory variability, suggesting right atrial pressure of 3 mmHg.  13. Moderate global reduction in LV systolic function; grade 1 diastolic  dysfunction; mild LVH; mildly dilated ascending aorta;.     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  Echocardiogram in 2019 showed EF 40 to 45%, G1 DD, biatrial enlargement and dilated RV.  Nuclear stress test in January 2020 showed no evidence of ischemia.  GDMT was titrated and repeat echocardiogram in 03/2019 showed EF 35 to 40%, G1 DD, normal RV and no mention of biatrial enlargement, dilation of ascending aorta measuring 39 mm.  He did have an exacerbation of heart failure symptoms in early September 2023 in the setting of medication nonadherence. His symptoms improved with resumption of his  medications. He was last seen in the office on 02/12/2022 and was stable from a cardiac standpoint.   He presents today for follow-up. Since his last visit  Chronic combined systolic and diastolic heart failure/NICM: Hypertension: Hyperlipidemia: OSA: Obesity: Disposition:   Home Medications    Current Outpatient Medications  Medication Sig Dispense Refill   albuterol (VENTOLIN HFA) 108 (90 Base) MCG/ACT inhaler Inhale 2 puffs into the lungs every 6 (six) hours as needed for wheezing or shortness of breath. (Patient not taking: Reported on 01/30/2022) 8 g 0   aspirin EC 81 MG tablet Take 81 mg by mouth at bedtime.     Blood Pressure Monitoring (ADULT BLOOD PRESSURE CUFF LG) KIT Use daily to monitor blood pressure (Patient not taking: Reported on 01/30/2022) 1 kit 0   buPROPion (WELLBUTRIN XL) 150 MG 24 hr tablet TAKE 1 TABLET(150 MG) BY MOUTH DAILY 90 tablet 1   furosemide (LASIX) 40 MG tablet TAKE 2 TABLETS(80 MG) BY MOUTH DAILY IN THE MORNING 180 tablet 1   metoprolol succinate (TOPROL-XL) 25 MG 24 hr tablet TAKE 1 TABLET BY MOUTH DAILY 90 tablet 3  Multiple Vitamin (MULTIVITAMIN WITH MINERALS) TABS tablet Take 1 tablet by mouth daily.     omeprazole (PRILOSEC) 20 MG capsule TAKE 1 CAPSULE(20 MG) BY MOUTH DAILY 90 capsule 1   rosuvastatin (CRESTOR) 40 MG tablet Take 1 tablet (40 mg total) by mouth daily. 90 tablet 3   sacubitril-valsartan (ENTRESTO) 24-26 MG TAKE 1 TABLET BY MOUTH TWICE DAILY. 180 tablet 3   spironolactone (ALDACTONE) 25 MG tablet Take 1 tablet (25 mg total) by mouth daily. 90 tablet 3   traZODone (DESYREL) 150 MG tablet TAKE 1 TABLET(150 MG) BY MOUTH AT BEDTIME 90 tablet 0   No current facility-administered medications for this visit.     Review of Systems    ***.  All other systems reviewed and are otherwise negative except as noted above.    Physical Exam    VS:  There were no vitals taken for this visit. , BMI There is no height or weight on file to  calculate BMI.     GEN: Well nourished, well developed, in no acute distress. HEENT: normal. Neck: Supple, no JVD, carotid bruits, or masses. Cardiac: RRR, no murmurs, rubs, or gallops. No clubbing, cyanosis, edema.  Radials/DP/PT 2+ and equal bilaterally.  Respiratory:  Respirations regular and unlabored, clear to auscultation bilaterally. GI: Soft, nontender, nondistended, BS + x 4. MS: no deformity or atrophy. Skin: warm and dry, no rash. Neuro:  Strength and sensation are intact. Psych: Normal affect.  Accessory Clinical Findings    ECG personally reviewed by me today - *** - no acute changes.   Lab Results  Component Value Date   WBC 8.9 11/01/2021   HGB 14.0 11/01/2021   HCT 42.7 11/01/2021   MCV 84.1 11/01/2021   PLT 284.0 11/01/2021   Lab Results  Component Value Date   CREATININE 0.89 02/12/2022   BUN 10 02/12/2022   NA 142 02/12/2022   K 3.9 02/12/2022   CL 100 02/12/2022   CO2 24 02/12/2022   Lab Results  Component Value Date   ALT 3 11/01/2021   AST 8 11/01/2021   ALKPHOS 50 11/01/2021   BILITOT 0.4 11/01/2021   Lab Results  Component Value Date   CHOL 107 11/01/2021   HDL 34.30 (L) 11/01/2021   LDLCALC 59 11/01/2021   TRIG 68.0 11/01/2021   CHOLHDL 3 11/01/2021    Lab Results  Component Value Date   HGBA1C 6.1 11/01/2021    Assessment & Plan    1.  ***  No BP recorded.  {Refresh Note OR Click here to enter BP  :1}***   Lenna Sciara, NP 06/17/2022, 5:37 AM

## 2022-06-21 ENCOUNTER — Telehealth: Payer: Self-pay

## 2022-06-21 NOTE — Progress Notes (Unsigned)
Patient ID: Derrick Mata, male   DOB: March 03, 1951, 72 y.o.   MRN: [id number]  Care Management & Coordination Services Pharmacy Team  Reason for Encounter: Offer to schedule with Pharmacist    06/21/22 Call to patient to offer appointment with Pharmacist. Did not reach voicemail is not set up to leave a message.  What concerns do you have about your medications?     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 No other visit details available.   Hospital visits:  None in previous 6 months  Medications: Outpatient Encounter Medications as of 06/21/2022  Medication Sig   albuterol (VENTOLIN HFA) 108 (90 Base) MCG/ACT inhaler Inhale 2 puffs into the lungs every 6 (six) hours as needed for wheezing or shortness of breath. (Patient not taking: Reported on 01/30/2022)   aspirin EC 81 MG tablet Take 81 mg by mouth at bedtime.   Blood Pressure Monitoring (ADULT BLOOD PRESSURE CUFF LG) KIT Use daily to monitor blood pressure (Patient not taking: Reported on 01/30/2022)   buPROPion (WELLBUTRIN XL) 150 MG 24 hr tablet TAKE 1 TABLET(150 MG) BY MOUTH DAILY   furosemide (LASIX) 40 MG tablet TAKE 2 TABLETS(80 MG) BY MOUTH DAILY IN THE MORNING   metoprolol succinate (TOPROL-XL) 25 MG 24 hr tablet TAKE 1 TABLET BY MOUTH DAILY   Multiple Vitamin (MULTIVITAMIN WITH MINERALS) TABS tablet Take 1 tablet by mouth daily.   omeprazole (PRILOSEC) 20 MG capsule TAKE 1 CAPSULE(20 MG) BY MOUTH DAILY   rosuvastatin (CRESTOR) 40 MG tablet Take 1 tablet (40 mg total) by mouth daily.   sacubitril-valsartan (ENTRESTO) 24-26 MG TAKE 1 TABLET BY MOUTH TWICE DAILY.   spironolactone (ALDACTONE) 25 MG tablet Take 1 tablet (25 mg total) by mouth daily.   traZODone (DESYREL) 150 MG tablet TAKE 1 TABLET(150 MG) BY MOUTH AT BEDTIME   No facility-administered encounter  medications on file as of 06/21/2022.    Recent vitals BP Readings from Last 3 Encounters:  02/12/22 128/84  01/30/22 116/86  11/01/21 120/70   Pulse Readings from Last 3 Encounters:  02/12/22 (!) 102  01/30/22 80  11/01/21 70   Wt Readings from Last 3 Encounters:  02/12/22 (!) 404 lb (183.3 kg)  01/30/22 (!) 403 lb 6.4 oz (183 kg)  11/01/21 (!) 401 lb 11.2 oz (182.2 kg)   BMI Readings from Last 3 Encounters:  02/12/22 54.79 kg/m  01/30/22 56.26 kg/m  11/01/21 56.82 kg/m    Recent lab results    Component Value Date/Time   NA 142 02/12/2022 1210   K 3.9 02/12/2022 1210   CL 100 02/12/2022 1210   CO2 24 02/12/2022 1210   GLUCOSE 89 02/12/2022 1210   GLUCOSE 87 11/01/2021 1357   BUN 10 02/12/2022 1210   CREATININE 0.89 02/12/2022 1210   CREATININE 0.94 10/03/2016 0959   CALCIUM 9.3 02/12/2022 1210    Lab Results  Component Value Date   CREATININE 0.89 02/12/2022   GFR 86.93 11/01/2021   EGFR 90 02/12/2022   GFRNONAA 78 07/04/2020   GFRAA 90 07/04/2020   Lab Results  Component Value Date/Time   HGBA1C 6.1 11/01/2021 01:57 PM   HGBA1C 6.1 05/10/2021 10:03 AM    Lab Results  Component Value Date   CHOL 107 11/01/2021   HDL 34.30 (L) 11/01/2021   LDLCALC 59 11/01/2021   TRIG 68.0 11/01/2021  CHOLHDL 3 11/01/2021    Care Gaps: AWV- Overdue Flu Vaccine - Overdue COVID Booster - Overdue  Star Rating Drugs:  Rosuvastatin (Crestor) 40 mg - Last filled 03/02/22 90 DS at [facility] Verified as accurate     [place] Pharmacist Assistant [phone]

## 2022-06-25 DIAGNOSIS — H43813 Vitreous degeneration, bilateral: Secondary | ICD-10-CM | POA: Diagnosis not present

## 2022-06-25 DIAGNOSIS — H35363 Drusen (degenerative) of macula, bilateral: Secondary | ICD-10-CM | POA: Diagnosis not present

## 2022-06-25 DIAGNOSIS — H538 Other visual disturbances: Secondary | ICD-10-CM | POA: Diagnosis not present

## 2022-06-25 DIAGNOSIS — H35033 Hypertensive retinopathy, bilateral: Secondary | ICD-10-CM | POA: Diagnosis not present

## 2022-07-09 ENCOUNTER — Other Ambulatory Visit: Payer: Self-pay | Admitting: Internal Medicine

## 2022-07-09 DIAGNOSIS — F3342 Major depressive disorder, recurrent, in full remission: Secondary | ICD-10-CM

## 2022-08-07 ENCOUNTER — Other Ambulatory Visit: Payer: Self-pay | Admitting: Internal Medicine

## 2022-08-07 DIAGNOSIS — G4700 Insomnia, unspecified: Secondary | ICD-10-CM

## 2022-09-03 ENCOUNTER — Telehealth: Payer: Self-pay | Admitting: *Deleted

## 2022-09-03 DIAGNOSIS — J449 Chronic obstructive pulmonary disease, unspecified: Secondary | ICD-10-CM

## 2022-09-03 MED ORDER — ALBUTEROL SULFATE HFA 108 (90 BASE) MCG/ACT IN AERS: 2.0000 | INHALATION_SPRAY | Freq: Four times a day (QID) | RESPIRATORY_TRACT | 0 refills | Status: DC | PRN

## 2022-09-03 NOTE — Telephone Encounter (Signed)
Refill sent.

## 2022-09-03 NOTE — Telephone Encounter (Signed)
-----   Message from Sherrill Raring, [facility] sent at 09/03/2022 10:51 AM EDT ----- Regarding: Med Refill Pt called requesting a refill on his Albuterol HFA inhaler, if possible.  Preferred Pharmacy: [facility] [address], [place] - [address] AT [facility]  Phone: [phone] Fax: [phone]    Sherrill Raring Clinical Pharmacist [phone]

## 2022-09-17 ENCOUNTER — Telehealth: Payer: Self-pay

## 2022-09-17 ENCOUNTER — Telehealth: Payer: Self-pay | Admitting: Internal Medicine

## 2022-09-17 NOTE — Telephone Encounter (Signed)
Handicap Parking Plate form to be filed out--placed in dr's folder.  Please call pt upon completion-- [phone]

## 2022-09-17 NOTE — Progress Notes (Signed)
Patient ID: Derrick Mata, male   DOB: 1951-04-06, 72 y.o.   MRN: [id number]  Care Management & Coordination Services Pharmacy Team  Reason for Encounter: Appointment Reminder  Contacted patient to confirm telephone appointment with Precious Haws, PharmD on 09/18/22 at 4. Unsuccessful outreach. Unable to leave voicemail.     Star Rating Drugs:  Rosuvastatin (Crestor) 40 mg - Last filled 09/05/22 90 DS at [facility]   Care Gaps: AWV- Overdue Flu Vaccine - Overdue COVID Booster - Overdue  Pamala Duffel CMA Clinical Pharmacist Assistant [phone]

## 2022-09-18 ENCOUNTER — Ambulatory Visit: Payer: Medicare Other

## 2022-09-18 NOTE — Telephone Encounter (Signed)
Placed in Dr Hernandez's folder 

## 2022-09-18 NOTE — Progress Notes (Signed)
Care Management & Coordination Services Pharmacy Note  09/18/2022 Name:  Derrick Mata MRN:  [id number] DOB:  [birth year]  Summary: LDL at goal <70 BP at goal <140/90, denies any fluid retention Requests yearly physical with PCP, scheduled  Recommendations/Changes made from today's visit: -Counseled on a cholesterol-lowering diet -Counseled to check weight daily and notify cardio as directed if you see more than 3lbs gained in 1 day or 5lbs in one week and fluid pill does not resolve (per their instruction).  -Counseled to limit sodium intake 2300mg   Follow up plan: Pharmacist visit in 4 months   Subjective: Derrick Mata is an 72 y.o. year old male who is a primary patient of Philip Aspen, Limmie Patricia, MD.  The care coordination team was consulted for assistance with disease management and care coordination needs.    Engaged with patient by telephone for follow up visit.  Recent office visits: None  Recent consult visits: None  Hospital visits: None in previous 6 months   Objective:  Lab Results  Component Value Date   CREATININE 0.89 02/12/2022   BUN 10 02/12/2022   GFR 86.93 11/01/2021   EGFR 90 02/12/2022   GFRNONAA 78 07/04/2020   GFRAA 90 07/04/2020   NA 142 02/12/2022   K 3.9 02/12/2022   CALCIUM 9.3 02/12/2022   CO2 24 02/12/2022   GLUCOSE 89 02/12/2022    Lab Results  Component Value Date/Time   HGBA1C 6.1 11/01/2021 01:57 PM   HGBA1C 6.1 05/10/2021 10:03 AM   GFR 86.93 11/01/2021 01:57 PM   GFR 86.33 10/27/2020 11:05 AM    Last diabetic Eye exam:  Lab Results  Component Value Date/Time   HMDIABEYEEXA No Retinopathy 03/25/2016 12:00 AM    Last diabetic Foot exam: No results found for: "HMDIABFOOTEX"   Lab Results  Component Value Date   CHOL 107 11/01/2021   HDL 34.30 (L) 11/01/2021   LDLCALC 59 11/01/2021   TRIG 68.0 11/01/2021   CHOLHDL 3 11/01/2021       Latest Ref Rng & Units 11/01/2021    1:57 PM 10/27/2020   11:05 AM  12/08/2018   12:17 PM  Hepatic Function  Total Protein 6.0 - 8.3 g/dL 6.7  7.1  6.7   Albumin 3.5 - 5.2 g/dL 3.5  3.7  3.6   AST 0 - 37 U/L 8  11  10    ALT 0 - 53 U/L 3  7  6    Alk Phosphatase 39 - 117 U/L 50  54  55   Total Bilirubin 0.2 - 1.2 mg/dL 0.4  0.5  0.4     Lab Results  Component Value Date/Time   TSH 1.77 11/01/2021 01:57 PM   TSH 2.06 10/27/2020 11:05 AM   FREET4 1.12 03/29/2011 06:15 AM       Latest Ref Rng & Units 11/01/2021    1:57 PM 10/27/2020   11:05 AM 12/08/2018   12:17 PM  CBC  WBC 4.0 - 10.5 K/uL 8.9  8.3  8.2   Hemoglobin 13.0 - 17.0 g/dL 40.9  81.1  91.4   Hematocrit 39.0 - 52.0 % 42.7  42.6  42.9   Platelets 150.0 - 400.0 K/uL 284.0  277.0  269.0     Lab Results  Component Value Date/Time   VD25OH 52.55 11/01/2021 01:57 PM   VD25OH 34.48 10/27/2020 11:05 AM   VITAMINB12 432 11/01/2021 01:57 PM   VITAMINB12 445 10/27/2020 11:05 AM    Clinical ASCVD: Yes  The ASCVD Risk score (Arnett DK, et al., 2019) failed to calculate for the following reasons:   The valid total cholesterol range is 130 to 320 mg/dL        1/61/0960    4:54 PM 10/27/2020   10:23 AM 07/04/2020    9:23 AM  Depression screen PHQ 2/9  Decreased Interest  0 0  Down, Depressed, Hopeless 0 0 0  PHQ - 2 Score 0 0 0  Altered sleeping 0 0 1  Tired, decreased energy 0 1 0  Change in appetite 0 0 0  Feeling bad or failure about yourself  0 0 0  Trouble concentrating 0 0 0  Moving slowly or fidgety/restless 0 0 0  Suicidal thoughts 0 0 0  PHQ-9 Score 0 1 1  Difficult doing work/chores Not difficult at all  Not difficult at all     Social History   Tobacco Use  Smoking Status Every Day   Packs/day: 1.00   Years: 51.00   Additional pack years: 0.00   Total pack years: 51.00   Types: Cigarettes   Start date: 05/21/1963  Smokeless Tobacco Never  Tobacco Comments   not ready to quit yet   BP Readings from Last 3 Encounters:  02/12/22 128/84  01/30/22 116/86  11/01/21  120/70   Pulse Readings from Last 3 Encounters:  02/12/22 (!) 102  01/30/22 80  11/01/21 70   Wt Readings from Last 3 Encounters:  02/12/22 (!) 404 lb (183.3 kg)  01/30/22 (!) 403 lb 6.4 oz (183 kg)  11/01/21 (!) 401 lb 11.2 oz (182.2 kg)   BMI Readings from Last 3 Encounters:  02/12/22 54.79 kg/m  01/30/22 56.26 kg/m  11/01/21 56.82 kg/m    Allergies  Allergen Reactions   Penicillins Anaphylaxis, Nausea Only and Other (See Comments)    Passed out. Has patient had a PCN reaction causing immediate rash, facial/tongue/throat swelling, SOB or lightheadedness with hypotension: {no Has patient had a PCN reaction causing severe rash involving mucus membranes or skin necrosis: {no Has patient had a PCN reaction that required hospitalization {no Has patient had a PCN reaction occurring within the last 10 years: {no If all of the above answers are "NO", then may proceed with Cephalosporin use.   Sulfamethoxazole Hives    Medications Reviewed Today     Reviewed by Sherrill Raring, RPH (Pharmacist) on 09/18/22 at 1605  Med List Status: <None>   Medication Order Taking? Sig Documenting Provider Last Dose Status Informant  albuterol (VENTOLIN HFA) 108 (90 Base) MCG/ACT inhaler [id number]  Inhale 2 puffs into the lungs every 6 (six) hours as needed for wheezing or shortness of breath. Philip Aspen, Limmie Patricia, MD  Active   aspirin EC 81 MG tablet [id number] No Take 81 mg by mouth at bedtime. [provider] Taking Active Self  Blood Pressure Monitoring (ADULT BLOOD PRESSURE CUFF LG) KIT [id number] No Use daily to monitor blood pressure  Patient not taking: Reported on 01/30/2022   Philip Aspen, Limmie Patricia, MD Not Taking Active   buPROPion (WELLBUTRIN XL) 150 MG 24 hr tablet [id number]  TAKE 1 TABLET(150 MG) BY MOUTH DAILY Philip Aspen, Limmie Patricia, MD  Active   furosemide (LASIX) 40 MG tablet [id number] No TAKE 2 TABLETS(80 MG) BY MOUTH DAILY IN THE MORNING Duke, Roe Rutherford,  PA Taking Active   metoprolol succinate (TOPROL-XL) 25 MG 24 hr tablet [id number] No TAKE 1 TABLET BY MOUTH DAILY Duke, Roe Rutherford, PA Taking Active  Multiple Vitamin (MULTIVITAMIN WITH MINERALS) TABS tablet [id number] No Take 1 tablet by mouth daily. [provider] Taking Active Self  omeprazole (PRILOSEC) 20 MG capsule [id number]  TAKE 1 CAPSULE(20 MG) BY MOUTH DAILY Philip Aspen, Limmie Patricia, MD  Active   rosuvastatin (CRESTOR) 40 MG tablet [id number] No Take 1 tablet (40 mg total) by mouth daily. Marcelino Duster, PA Taking Active   sacubitril-valsartan (ENTRESTO) 24-26 [place] [id number] No TAKE 1 TABLET BY MOUTH TWICE DAILY. Marcelino Duster, PA Taking Active   spironolactone (ALDACTONE) 25 MG tablet [id number] No Take 1 tablet (25 mg total) by mouth daily. Marcelino Duster, PA Taking Active   traZODone (DESYREL) 150 MG tablet [id number]  TAKE 1 TABLET(150 MG) BY MOUTH AT BEDTIME Philip Aspen, Limmie Patricia, MD  Active             SDOH:  (Social Determinants of Health) assessments and interventions performed: Yes SDOH Interventions    Flowsheet Row Chronic Care Management from 01/29/2021 in [facility]  SDOH Interventions   Transportation Interventions Intervention Not Indicated  Financial Strain Interventions Other (Comment)  [working on PAP for Entresto]       Medication Assistance: None required.  Patient affirms current coverage meets needs.  Medication Access: Within the past 30 days, how often has patient missed a dose of medication? None Is a pillbox or other method used to improve adherence? Yes  Factors that may affect medication adherence? no barriers identified Are meds synced by current pharmacy? No  Are meds delivered by current pharmacy? No  Does patient experience delays in picking up medications due to transportation concerns? No   [facility] Reviewed: Is patient disadvantaged to use [facility]?: Yes   Current Rx insurance plan: [facility] Name and location of Current pharmacy:  [facility] - [place], [place] - [address] [place] [address] Phone: [phone] Fax: [phone]  [id number], [place] - [address] AT [facility] [address] [place] [address] Phone: [phone] Fax: [phone]  [id number] - [place], [place] - [address] AT [facility] [address] [place] [address] Phone: [phone] Fax: [phone]  [facility] services reviewed with patient today?: No  Patient requests to transfer care to [facility]?: No  Reason patient declined to change pharmacies: Disadvantaged due to insurance/mail order  Compliance/Adherence/Medication fill history: Care Gaps: AWV- Due 11/02/2022 Flu Vaccine - Overdue COVID Booster - Overdue  Star-[facility] Drugs: Rosuvastatin (Crestor) 40 mg - Last filled 09/05/22 90 DS at [facility]    Assessment/Plan Hyperlipidemia: (LDL goal < 70) -Controlled -Current treatment: Rosuvastatin 40mg  1 qd -Medications previously tried: None  -Current dietary patterns: Tries to limit fried foods -Current exercise habits: Not discussed -Educated on Cholesterol goals;  Benefits of statin for ASCVD risk reduction; Importance of limiting foods high in cholesterol; -Recommended to continue current medication  Heart Failure (Goal: manage symptoms and prevent exacerbations) -Controlled -Last ejection fraction: 35-40% (Date: 04/08/19) -HF type: 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%) -NYHA Class: III (marked limitation of activity) -AHA HF Stage: C (Heart disease and symptoms present) -Current treatment: Lasix 40mg  1 tab in the morning and 1 extra tab if needed Appropriate, Effective, Safe, Accessible Metoprolol XL 25mg  1 qd Appropriate, Effective, Safe, Accessible Entresto 24-26mg  1 BID Appropriate, Effective, Safe, Accessible Spironolactone 25mg  1  qd Appropriate, Effective, Safe, Accessible -Medications previously tried: None  -  Current home BP/HR readings: does not have a machine to check -Current home daily weights: tries to check daily -Current dietary habits: Never adds salt to food -Current exercise habits: Not discussed -Denies any signs of fluid retention today, had mild ankle swelling on Sat that compression socks controlled and has resolved -Educated on Benefits of medications for managing symptoms and prolonging life Importance of weighing daily; if you gain more than 3 pounds in one day or 5 pounds in one week, contact cardiologist if not resolved by fluid pill, can also contact our care team here Importance of blood pressure control -Recommended to continue current medication  Sherrill Raring Clinical Pharmacist [phone]

## 2022-09-19 NOTE — Telephone Encounter (Signed)
Form ready for pick up and patient is aware 

## 2022-10-30 ENCOUNTER — Other Ambulatory Visit: Payer: Self-pay | Admitting: Internal Medicine

## 2022-10-30 DIAGNOSIS — J449 Chronic obstructive pulmonary disease, unspecified: Secondary | ICD-10-CM

## 2022-10-30 DIAGNOSIS — G4700 Insomnia, unspecified: Secondary | ICD-10-CM

## 2022-11-06 ENCOUNTER — Ambulatory Visit (INDEPENDENT_AMBULATORY_CARE_PROVIDER_SITE_OTHER): Payer: Medicare Other | Admitting: Internal Medicine

## 2022-11-06 VITALS — BP 110/70 | HR 74 | Temp 98.1°F | Ht 70.0 in | Wt 388.4 lb

## 2022-11-06 DIAGNOSIS — Z0000 Encounter for general adult medical examination without abnormal findings: Secondary | ICD-10-CM | POA: Diagnosis not present

## 2022-11-06 DIAGNOSIS — I509 Heart failure, unspecified: Secondary | ICD-10-CM

## 2022-11-06 DIAGNOSIS — I10 Essential (primary) hypertension: Secondary | ICD-10-CM

## 2022-11-06 DIAGNOSIS — G4733 Obstructive sleep apnea (adult) (pediatric): Secondary | ICD-10-CM

## 2022-11-06 DIAGNOSIS — R7302 Impaired glucose tolerance (oral): Secondary | ICD-10-CM

## 2022-11-06 DIAGNOSIS — E669 Obesity, unspecified: Secondary | ICD-10-CM

## 2022-11-06 NOTE — Progress Notes (Signed)
Established Patient Office Visit     CC/Reason for Visit: Annual preventive exam and subsequent Medicare wellness visit  HPI: Derrick Mata is a 72 y.o. male who is coming in today for the above mentioned reasons. Past Medical History is significant for:   1. Chronic combined CHF.  Followed by Dr. Jens Som, most recent medication changes include addition of Entresto.   2. Morbid Obesity with a BMI of 56.82.    3. OSA, refuses CPAP use.   4. Depression well controlled on wellbutrin.   5. GERD no symptoms on omeprazole.   6. Hep C Cirrhosis, completed treatment with [facility]. Gi is Dr. Marina Goodell.   7. Ongoing tobacco abuse of at least 1 PPD if not more. He states he chain smokes from 8 am till about 8 pm every day.   8.  Impaired glucose tolerance.   He has routine eye and dental care.  Is overdue for seasonal vaccines, he had a colonoscopy in 2022.  Past Medical/Surgical History: Past Medical History:  Diagnosis Date   Arthritis    OA of knees   CHF (congestive heart failure) (HCC)    Closed compression fracture of L1 lumbar vertebra 09/03/2016   Colon polyp    Complication of anesthesia    slow to wake up   COPD (chronic obstructive pulmonary disease) (HCC)    > 30 pack yr smoker   Depression    Anxiety and Insomnia   Diverticulosis    Erectile dysfunction    GERD (gastroesophageal reflux disease)    Hemorrhoids    Hep C w/o coma, chronic (HCC) 06/12/2015   treated with medications   Leg edema    Morbid obesity (HCC)    Sleep apnea    refuses CPAP   Substance abuse (HCC)    hx alcohol and cocaine use, 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, 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    Past Surgical History:  Procedure Laterality Date   COLONOSCOPY  04/18/2011   Procedure: COLONOSCOPY;  Surgeon: Yancey Flemings, MD;  Location: [facility];  Service: Endoscopy;  Laterality: N/A;   COLONOSCOPY WITH PROPOFOL N/A 05/07/2016   Procedure: COLONOSCOPY WITH PROPOFOL;  Surgeon: Hilarie Fredrickson, MD;  Location: [facility];  Service: Endoscopy;  Laterality: N/A;   COLONOSCOPY WITH PROPOFOL N/A 08/12/2017   Procedure: COLONOSCOPY WITH PROPOFOL;  Surgeon: Hilarie Fredrickson, MD;  Location: [facility];  Service: Endoscopy;  Laterality: N/A;   COLONOSCOPY WITH PROPOFOL N/A 01/25/2021   Procedure: COLONOSCOPY WITH PROPOFOL;  Surgeon: Hilarie Fredrickson, MD;  Location: [facility];  Service: Endoscopy;  Laterality: N/A;   ESOPHAGOGASTRODUODENOSCOPY (EGD) WITH PROPOFOL N/A 05/07/2016   Procedure: ESOPHAGOGASTRODUODENOSCOPY (EGD) WITH PROPOFOL;  Surgeon: Hilarie Fredrickson, MD;  Location: [facility];  Service: Endoscopy;  Laterality: N/A;   ESOPHAGOGASTRODUODENOSCOPY (EGD) WITH PROPOFOL N/A 01/25/2021   Procedure: ESOPHAGOGASTRODUODENOSCOPY (EGD) WITH PROPOFOL;  Surgeon: Hilarie Fredrickson, MD;  Location: [facility];  Service: Endoscopy;  Laterality: N/A;   KNEE ARTHROSCOPY  2011   left   LUMBAR LAMINECTOMY  1994, 2010   POLYPECTOMY  01/25/2021   Procedure: POLYPECTOMY;  Surgeon: Hilarie Fredrickson, MD;  Location: [facility];  Service: Endoscopy;;   TIBIA FRACTURE SURGERY  2006   hardware   TONSILLECTOMY  1963    Social History:  reports that he has been smoking cigarettes. He started smoking about 59 years ago. He has a 51.00 pack-year smoking history. He has never used smokeless tobacco. He reports  that he does not drink alcohol and does not use drugs.  Allergies: Allergies  Allergen Reactions   Penicillins Anaphylaxis, Nausea Only and Other (See Comments)    Passed out. Has patient had a PCN reaction causing immediate rash, facial/tongue/throat swelling, SOB or lightheadedness with hypotension: {no Has patient had a PCN reaction causing severe rash involving mucus membranes or skin necrosis: {no Has patient had a PCN reaction that required hospitalization {no Has patient had a PCN reaction occurring within the last 10 years: {no If all of the above answers are "NO", then may proceed with  Cephalosporin use.   Sulfamethoxazole Hives    Family History:  Family History  Problem Relation Age of Onset   Breast cancer Mother    Heart disease Father 72   Colon cancer Sister    Cancer Sister 14       sister, colon    Heart disease Other    Stomach cancer Neg Hx    Rectal cancer Neg Hx    Esophageal cancer Neg Hx    Pancreatic cancer Neg Hx      Current Outpatient Medications:    albuterol (VENTOLIN HFA) 108 (90 Base) MCG/ACT inhaler, INHALE 2 PUFFS INTO THE LUNGS EVERY 6 HOURS AS NEEDED FOR WHEEZING OR SHORTNESS OF BREATH, Disp: 6.7 g, Rfl: 0   aspirin EC 81 MG tablet, Take 81 mg by mouth at bedtime., Disp: , Rfl:    buPROPion (WELLBUTRIN XL) 150 MG 24 hr tablet, TAKE 1 TABLET(150 MG) BY MOUTH DAILY, Disp: 90 tablet, Rfl: 1   furosemide (LASIX) 40 MG tablet, TAKE 2 TABLETS(80 MG) BY MOUTH DAILY IN THE MORNING, Disp: 180 tablet, Rfl: 1   metoprolol succinate (TOPROL-XL) 25 MG 24 hr tablet, TAKE 1 TABLET BY MOUTH DAILY, Disp: 90 tablet, Rfl: 3   Multiple Vitamin (MULTIVITAMIN WITH MINERALS) TABS tablet, Take 1 tablet by mouth daily., Disp: , Rfl:    Omega-3 Fatty Acids (FISH OIL) 300 MG CAPS, Take by mouth., Disp: , Rfl:    omeprazole (PRILOSEC) 20 MG capsule, TAKE 1 CAPSULE(20 MG) BY MOUTH DAILY, Disp: 90 capsule, Rfl: 1   rosuvastatin (CRESTOR) 40 MG tablet, Take 1 tablet (40 mg total) by mouth daily., Disp: 90 tablet, Rfl: 3   sacubitril-valsartan (ENTRESTO) 24-26 MG, TAKE 1 TABLET BY MOUTH TWICE DAILY., Disp: 180 tablet, Rfl: 3   spironolactone (ALDACTONE) 25 MG tablet, Take 1 tablet (25 mg total) by mouth daily., Disp: 90 tablet, Rfl: 3   traZODone (DESYREL) 150 MG tablet, TAKE 1 TABLET(150 MG) BY MOUTH AT BEDTIME, Disp: 90 tablet, Rfl: 0  Review of Systems:  Negative unless indicated in HPI.   Physical Exam: Vitals:   11/06/22 1319  BP: 110/70  Pulse: 74  Temp: 98.1 F (36.7 C)  TempSrc: Oral  SpO2: 98%  Weight: (!) 388 lb 6.4 oz (176.2 kg)  Height: 5\' 10"   (1.778 m)    Body mass index is 55.73 kg/m.   Physical Exam Vitals reviewed.  Constitutional:      General: He is not in acute distress.    Appearance: Normal appearance. He is not ill-appearing, toxic-appearing or diaphoretic.  HENT:     Head: Normocephalic.     Right Ear: Tympanic membrane, ear canal and external ear normal. There is no impacted cerumen.     Left Ear: Tympanic membrane, ear canal and external ear normal. There is no impacted cerumen.     Nose: Nose normal.     Mouth/Throat:  Mouth: Mucous membranes are moist.     Pharynx: Oropharynx is clear. No oropharyngeal exudate or posterior oropharyngeal erythema.  Eyes:     General: No scleral icterus.       Right eye: No discharge.        Left eye: No discharge.     Conjunctiva/sclera: Conjunctivae normal.     Pupils: Pupils are equal, round, and reactive to light.  Neck:     Vascular: No carotid bruit.  Cardiovascular:     Rate and Rhythm: Normal rate and regular rhythm.     Pulses: Normal pulses.     Heart sounds: Normal heart sounds.  Pulmonary:     Effort: Pulmonary effort is normal. No respiratory distress.     Breath sounds: Normal breath sounds.  Abdominal:     General: Abdomen is flat. Bowel sounds are normal.     Palpations: Abdomen is soft.  Musculoskeletal:        General: Normal range of motion.     Cervical back: Normal range of motion.  Skin:    General: Skin is warm and dry.  Neurological:     General: No focal deficit present.     Mental Status: He is alert and oriented to person, place, and time. Mental status is at baseline.  Psychiatric:        Mood and Affect: Mood normal.        Behavior: Behavior normal.        Thought Content: Thought content normal.        Judgment: Judgment normal.    Subsequent Medicare wellness visit   1. Risk factors, based on past  M,S,F - Cardiac Risk Factors include: hypertension;male gender   2.  Physical activities: Dietary issues and exercise  activities discussed:      3.  Depression/mood:  Flowsheet Row Office Visit from 11/06/2022 in [facility] Total Score 0        4.  ADL's:    11/06/2022    1:06 PM  In your present state of health, do you have any difficulty performing the following activities:  Hearing? 1  Vision? 0  Difficulty concentrating or making decisions? 0  Walking or climbing stairs? 1  Comment knee pain  Dressing or bathing? 0  Doing errands, shopping? 0  Preparing Food and eating ? N  Using the Toilet? N  In the past six months, have you accidently leaked urine? Y  Do you have problems with loss of bowel control? N  Managing your Medications? N  Managing your Finances? N  Housekeeping or managing your Housekeeping? N     5.  Fall risk:     01/25/2021    8:41 AM 04/10/2021    3:37 PM 08/29/2021    6:35 PM 11/01/2021    3:38 PM 11/06/2022    1:18 PM  Fall Risk  Falls in the past year?    1 0  Was there an injury with Fall?    1 0  Fall Risk Category Calculator    3 0  Fall Risk Category (Retired)    High   (RETIRED) Patient Fall Risk Level Moderate fall risk Low fall risk Low fall risk High fall risk   Patient at Risk for Falls Due to    Impaired balance/gait   Fall risk Follow up    Falls evaluation completed Falls evaluation completed     6.  Home safety: No problems identified  7.  Height weight, and visual acuity: height and weight as above, vision/hearing: Vision Screening   Right eye Left eye Both eyes  Without correction 20/40 20/40 20/40   With correction        8.  Counseling: Ready to quit: Not Answered Counseling given: Not Answered Tobacco comments: not ready to quit yet    9. Lab orders based on risk factors: Laboratory update will be reviewed   10. Cognitive assessment:        11/06/2022    1:12 PM 01/03/2017    9:52 AM  6CIT Screen  What Year? 0 points 0 points  What month? 0 points 0 points  What time? 0 points 0  points  Count back from 20 0 points 0 points  Months in reverse 0 points 0 points  Repeat phrase 0 points 0 points  Total Score 0 points 0 points     11. Screening: Patient provided with a written and personalized 5-10 year screening schedule in the AVS. Health Maintenance  Topic Date Due   COVID-19 Vaccine (5 - 2023-24 season) 01/18/2022   Screening for Lung Cancer  12/18/2022   Flu Shot  12/19/2022   Medicare Annual Wellness Visit  11/06/2023   Colon Cancer Screening  01/26/2031   DTaP/Tdap/Td vaccine (2 - Td or Tdap) 08/30/2031   Pneumonia Vaccine  Completed   Hepatitis C Screening  Completed   Zoster (Shingles) Vaccine  Completed   HPV Vaccine  Aged Out    12. Provider List Update: Patient Care Team    Relationship Specialty Notifications Start End  Philip Aspen, Limmie Patricia, MD PCP - General Internal Medicine  10/09/18   Lewayne Bunting, MD PCP - Cardiology Cardiology  07/22/19   Gardiner Barefoot, MD Consulting Physician Infectious Diseases  09/21/15   Mateo Flow, MD Consulting Physician Ophthalmology  09/21/15   Marcelino Duster, PA Physician Assistant Cardiology  01/30/22   Sherrill Raring, [facility] (Inactive)  Pharmacist  09/18/22      13. Advance Directives: Does Patient Have a Medical Advance Directive?: No Would patient like information on creating a medical advance directive?: No - Patient declined  14. Opioids: Patient is not on any opioid prescriptions and has no risk factors for a substance use disorder.   15.   Goals       patient      Wants to drink more water., Helps him with weight loss        Track and Manage My Blood Pressure-Hypertension      Timeframe:  Short-Term Goal Priority:  High Start Date:                             Expected End Date:                       Follow Up Date 06/13/21    - check blood pressure weekly - choose a place to take my blood pressure (home, clinic or office, retail store) - write blood pressure results in a log  or diary    Why is this important?   You won't feel high blood pressure, but it can still hurt your blood vessels.  High blood pressure can cause heart or kidney problems. It can also cause a stroke.  Making lifestyle changes like losing a little weight or eating less salt will help.  Checking your blood pressure at home and at  different times of the day can help to control blood pressure.  If the doctor prescribes medicine remember to take it the way the doctor ordered.  Call the office if you cannot afford the medicine or if there are questions about it.     Notes:       Weight (lb) < 200 lb (90.7 kg) (pt-stated)      Patient wants to talk to people about the Lord         I have personally reviewed and noted the following in the patient's chart:   Medical and social history Use of alcohol, tobacco or illicit drugs  Current medications and supplements Functional ability and status Nutritional status Physical activity Advanced directives List of other physicians Hospitalizations, surgeries, and ER visits in previous 12 months Vitals Screenings to include cognitive, depression, and falls Referrals and appointments  In addition, I have reviewed and discussed with patient certain preventive protocols, quality metrics, and best practice recommendations. A written personalized care plan for preventive services as well as general preventive health recommendations were provided to patient.   Impression and Plan:  Medicare annual wellness visit, subsequent -     PSA; Future  Essential hypertension -     CBC with Differential/Platelet; Future -     Comprehensive metabolic panel; Future  IGT (impaired glucose tolerance) -     Hemoglobin A1c; Future  OSA (obstructive sleep apnea)  Super obesity -     Lipid panel; Future -     Vitamin B12; Future -     VITAMIN D 25 Hydroxy (Vit-D Deficiency, Fractures); Future  Congestive heart failure, NYHA class 1, unspecified congestive  heart failure type (HCC) -     TSH; Future   -Recommend routine eye and dental care. -Healthy lifestyle discussed in detail. -Labs to be updated today. -Prostate cancer screening: PSA today Health Maintenance  Topic Date Due   COVID-19 Vaccine (5 - 2023-24 season) 01/18/2022   Screening for Lung Cancer  12/18/2022   Flu Shot  12/19/2022   Medicare Annual Wellness Visit  11/06/2023   Colon Cancer Screening  01/26/2031   DTaP/Tdap/Td vaccine (2 - Td or Tdap) 08/30/2031   Pneumonia Vaccine  Completed   Hepatitis C Screening  Completed   Zoster (Shingles) Vaccine  Completed   HPV Vaccine  Aged Out        Darlin Stenseth Philip Aspen, MD  Primary Care at [facility]

## 2022-11-07 ENCOUNTER — Other Ambulatory Visit (INDEPENDENT_AMBULATORY_CARE_PROVIDER_SITE_OTHER): Payer: Medicare Other

## 2022-11-07 DIAGNOSIS — E669 Obesity, unspecified: Secondary | ICD-10-CM

## 2022-11-07 DIAGNOSIS — R7302 Impaired glucose tolerance (oral): Secondary | ICD-10-CM

## 2022-11-07 DIAGNOSIS — I509 Heart failure, unspecified: Secondary | ICD-10-CM

## 2022-11-07 DIAGNOSIS — Z0000 Encounter for general adult medical examination without abnormal findings: Secondary | ICD-10-CM | POA: Diagnosis not present

## 2022-11-07 DIAGNOSIS — Z6841 Body Mass Index (BMI) 40.0 and over, adult: Secondary | ICD-10-CM | POA: Diagnosis not present

## 2022-11-07 DIAGNOSIS — I10 Essential (primary) hypertension: Secondary | ICD-10-CM

## 2022-11-07 DIAGNOSIS — Z125 Encounter for screening for malignant neoplasm of prostate: Secondary | ICD-10-CM

## 2022-11-07 LAB — VITAMIN D 25 HYDROXY (VIT D DEFICIENCY, FRACTURES): VITD: 42.38 ng/mL (ref 30.00–100.00)

## 2022-11-07 LAB — COMPREHENSIVE METABOLIC PANEL
ALT: 4 U/L (ref 0–53)
AST: 8 U/L (ref 0–37)
Albumin: 3.8 g/dL (ref 3.5–5.2)
Alkaline Phosphatase: 53 U/L (ref 39–117)
BUN: 12 mg/dL (ref 6–23)
CO2: 25 mEq/L (ref 19–32)
Calcium: 8.9 mg/dL (ref 8.4–10.5)
Chloride: 104 mEq/L (ref 96–112)
Creatinine, Ser: 1.13 mg/dL (ref 0.40–1.50)
GFR: 64.33 mL/min (ref >60.00)
Glucose, Bld: 87 mg/dL (ref 70–99)
Potassium: 4 mEq/L (ref 3.5–5.1)
Sodium: 141 mEq/L (ref 135–145)
Total Bilirubin: 0.6 mg/dL (ref 0.2–1.2)
Total Protein: 7.3 g/dL (ref 6.0–8.3)

## 2022-11-07 LAB — CBC WITH DIFFERENTIAL/PLATELET
Basophils Absolute: 0 10*3/uL (ref 0.0–0.1)
Basophils Relative: 0.5 % (ref 0.0–3.0)
Eosinophils Absolute: 0.1 10*3/uL (ref 0.0–0.7)
Eosinophils Relative: 0.8 % (ref 0.0–5.0)
HCT: 45.3 % (ref 39.0–52.0)
Hemoglobin: 14.6 g/dL (ref 13.0–17.0)
Lymphocytes Relative: 21.1 % (ref 12.0–46.0)
Lymphs Abs: 1.5 10*3/uL (ref 0.7–4.0)
MCHC: 32.3 g/dL (ref 30.0–36.0)
MCV: 83.7 fl (ref 78.0–100.0)
Monocytes Absolute: 0.5 10*3/uL (ref 0.1–1.0)
Monocytes Relative: 6.2 % (ref 3.0–12.0)
Neutro Abs: 5.2 10*3/uL (ref 1.4–7.7)
Neutrophils Relative %: 71.4 % (ref 43.0–77.0)
Platelets: 256 10*3/uL (ref 150.0–400.0)
RBC: 5.41 Mil/uL (ref 4.22–5.81)
RDW: 14.7 % (ref 11.5–15.5)
WBC: 7.3 10*3/uL (ref 4.0–10.5)

## 2022-11-07 LAB — LIPID PANEL
Cholesterol: 112 mg/dL (ref 0–200)
HDL: 33.4 mg/dL — ABNORMAL LOW (ref >39.00)
LDL Cholesterol: 64 mg/dL (ref 0–99)
NonHDL: 78.95
Total CHOL/HDL Ratio: 3
Triglycerides: 73 mg/dL (ref 0.0–149.0)
VLDL: 14.6 mg/dL (ref 0.0–40.0)

## 2022-11-07 LAB — TSH: TSH: 2.85 u[IU]/mL (ref 0.35–5.50)

## 2022-11-07 LAB — PSA: PSA: 1.78 ng/mL (ref 0.10–4.00)

## 2022-11-07 LAB — VITAMIN B12: Vitamin B-12: 403 pg/mL (ref 211–911)

## 2022-11-07 LAB — HEMOGLOBIN A1C: Hgb A1c MFr Bld: 6.1 % (ref 4.6–6.5)

## 2022-11-18 ENCOUNTER — Encounter (INDEPENDENT_AMBULATORY_CARE_PROVIDER_SITE_OTHER): Payer: Self-pay

## 2022-11-18 ENCOUNTER — Encounter (INDEPENDENT_AMBULATORY_CARE_PROVIDER_SITE_OTHER): Payer: Medicare Other | Admitting: Ophthalmology

## 2022-11-26 ENCOUNTER — Ambulatory Visit: Payer: Medicare Other | Admitting: Internal Medicine

## 2022-11-26 ENCOUNTER — Other Ambulatory Visit: Payer: Self-pay | Admitting: *Deleted

## 2022-11-26 DIAGNOSIS — I10 Essential (primary) hypertension: Secondary | ICD-10-CM

## 2022-11-26 DIAGNOSIS — R7302 Impaired glucose tolerance (oral): Secondary | ICD-10-CM

## 2022-11-26 DIAGNOSIS — H538 Other visual disturbances: Secondary | ICD-10-CM

## 2022-11-28 ENCOUNTER — Emergency Department (HOSPITAL_COMMUNITY): Payer: Medicare Other

## 2022-11-28 ENCOUNTER — Other Ambulatory Visit: Payer: Self-pay

## 2022-11-28 ENCOUNTER — Encounter (HOSPITAL_COMMUNITY): Payer: Self-pay | Admitting: Emergency Medicine

## 2022-11-28 ENCOUNTER — Ambulatory Visit (HOSPITAL_COMMUNITY)
Admission: EM | Admit: 2022-11-28 | Discharge: 2022-11-28 | Disposition: A | Discharge status: Home / Self Care | Payer: Medicare Other

## 2022-11-28 ENCOUNTER — Emergency Department (HOSPITAL_COMMUNITY)
Admission: EM | Admit: 2022-11-28 | Discharge: 2022-11-28 | Disposition: A | Discharge status: Home / Self Care | Payer: Medicare Other | Source: Home / Self Care | Attending: Emergency Medicine | Admitting: Emergency Medicine

## 2022-11-28 DIAGNOSIS — R079 Chest pain, unspecified: Secondary | ICD-10-CM

## 2022-11-28 DIAGNOSIS — R0789 Other chest pain: Secondary | ICD-10-CM | POA: Insufficient documentation

## 2022-11-28 DIAGNOSIS — R918 Other nonspecific abnormal finding of lung field: Secondary | ICD-10-CM | POA: Diagnosis not present

## 2022-11-28 DIAGNOSIS — R0602 Shortness of breath: Secondary | ICD-10-CM | POA: Diagnosis not present

## 2022-11-28 LAB — BASIC METABOLIC PANEL
Anion gap: 9 (ref 5–15)
BUN: 9 mg/dL (ref 8–23)
CO2: 23 mmol/L (ref 22–32)
Calcium: 8.7 mg/dL — ABNORMAL LOW (ref 8.9–10.3)
Chloride: 106 mmol/L (ref 98–111)
Creatinine, Ser: 1.04 mg/dL (ref 0.61–1.24)
GFR, Estimated: >60 mL/min (ref >60)
Glucose, Bld: 109 mg/dL — ABNORMAL HIGH (ref 70–99)
Potassium: 4 mmol/L (ref 3.5–5.1)
Sodium: 138 mmol/L (ref 135–145)

## 2022-11-28 LAB — CBC
HCT: 46.4 % (ref 39.0–52.0)
Hemoglobin: 15.2 g/dL (ref 13.0–17.0)
MCH: 27.4 pg (ref 26.0–34.0)
MCHC: 32.8 g/dL (ref 30.0–36.0)
MCV: 83.6 fL (ref 80.0–100.0)
Platelets: 243 10*3/uL (ref 150–400)
RBC: 5.55 MIL/uL (ref 4.22–5.81)
RDW: 15.4 % (ref 11.5–15.5)
WBC: 8.6 10*3/uL (ref 4.0–10.5)
nRBC: 0 % (ref 0.0–0.2)

## 2022-11-28 LAB — TROPONIN I (HIGH SENSITIVITY): Troponin I (High Sensitivity): 5 ng/L (ref <18)

## 2022-11-28 MED ORDER — DICLOFENAC SODIUM 1 % EX GEL: 4.0000 g | Freq: Four times a day (QID) | CUTANEOUS | 0 refills | Status: DC

## 2022-11-28 MED ORDER — KETOROLAC TROMETHAMINE 15 MG/ML IJ SOLN
15.0000 mg | Freq: Once | INTRAMUSCULAR | Status: AC
  Administered 2022-11-28: 15 mg via INTRAMUSCULAR
  Filled 2022-11-28: qty 1

## 2022-11-28 NOTE — ED Notes (Signed)
Salli Quarry, NP is currently evaluating patient

## 2022-11-28 NOTE — Discharge Instructions (Addendum)
As you are actively having chest pain and due to your medical history you will need to go to the nearest emergency department for immediate evaluation to rule out serious cardiac involvement

## 2022-11-28 NOTE — Discharge Instructions (Addendum)
Please follow-up with your cardiologist in the office.  I think most likely pulled a muscle in your chest wall.  Your x-ray did not show a pneumonia or collapsed lung.  Your marker for heart damage was normal.  Please follow-up with your family doctor as well.  I prescribed you a gel he can rub on it.  Use the gel as prescribed. Also take tylenol 1000mg (2 extra strength) four times a day.

## 2022-11-28 NOTE — ED Triage Notes (Signed)
Patient arrives in wheelchair by POV c/o left sided chest pain onset of last night and worse this morning. Also having shortness of breath.

## 2022-11-28 NOTE — ED Provider Notes (Signed)
Patient presents for evaluation of left-sided chest pain possibly starting yesterday afternoon, unsure but positive that symptoms were present this morning upon awakening once effusion, worsening in intensity.  Pain does not radiate.  Experiencing shortness of breath at rest.  Still over the past 1 to 2 weeks has had 2 episodes of bilateral blurred vision, not currently present.  Denies dizziness, lightheadedness, headaches. history of hypertension, CHF, sleep apnea, morbid obesity.  Endorses Entresto prescribed for twice daily but only has been taking in the mornings.  Daily tobacco use.  Vital signs are stable, EKG shows normal sinus rhythm, actively having chest pain and on exam, lungs are clear breathing is labored therefore he is being sent to the nearest emergency department for evaluation of CHF exacerbation and chest pain, to escort self.    Valinda Hoar, NP 11/28/22 1312

## 2022-11-28 NOTE — ED Provider Notes (Signed)
[place] EMERGENCY DEPARTMENT AT [facility] Provider Note   CSN: [id number] Arrival date & time: 11/28/22  1344     History  Chief Complaint  Patient presents with   Chest Pain    Derrick Mata is a 72 y.o. male.  72 yo M with a chief complaints of left-sided chest pain.  He has trouble describing this.  Denies radiation.  Seems to be worse with what ever he does.  Worse with deep breathing.  Denies cough or congestion.  Denies fevers denies trauma denies rash.   Chest Pain      Home Medications Prior to Admission medications   Medication Sig Start Date End Date Taking? Authorizing Provider  diclofenac Sodium (VOLTAREN) 1 % GEL Apply 4 g topically 4 (four) times daily. 11/28/22  Yes Melene Plan, DO  albuterol (VENTOLIN HFA) 108 (90 Base) MCG/ACT inhaler INHALE 2 PUFFS INTO THE LUNGS EVERY 6 HOURS AS NEEDED FOR WHEEZING OR SHORTNESS OF BREATH 10/30/22   Philip Aspen, Limmie Patricia, MD  aspirin EC 81 MG tablet Take 81 mg by mouth at bedtime.    [provider]  buPROPion (WELLBUTRIN XL) 150 MG 24 hr tablet TAKE 1 TABLET(150 MG) BY MOUTH DAILY 07/09/22   Philip Aspen, Limmie Patricia, MD  furosemide (LASIX) 40 MG tablet TAKE 2 TABLETS(80 MG) BY MOUTH DAILY IN THE MORNING 01/30/22   [facility], Roe Rutherford, PA  metoprolol succinate (TOPROL-XL) 25 MG 24 hr tablet TAKE 1 TABLET BY MOUTH DAILY 01/30/22   [facility], Roe Rutherford, PA  Multiple Vitamin (MULTIVITAMIN WITH MINERALS) TABS tablet Take 1 tablet by mouth daily.    [provider]  Omega-3 Fatty Acids (FISH OIL) 300 MG CAPS Take by mouth.    [provider]  omeprazole (PRILOSEC) 20 MG capsule TAKE 1 CAPSULE(20 MG) BY MOUTH DAILY 04/01/22   Philip Aspen, Limmie Patricia, MD  rosuvastatin (CRESTOR) 40 MG tablet Take 1 tablet (40 mg total) by mouth daily. 01/30/22   [facility], Roe Rutherford, PA  sacubitril-valsartan (ENTRESTO) 24-26 MG TAKE 1 TABLET BY MOUTH TWICE DAILY. 01/30/22   [facility], Roe Rutherford, PA   spironolactone (ALDACTONE) 25 MG tablet Take 1 tablet (25 mg total) by mouth daily. 01/30/22   Marcelino Duster, PA  traZODone (DESYREL) 150 MG tablet TAKE 1 TABLET(150 MG) BY MOUTH AT BEDTIME 10/30/22   Philip Aspen, Limmie Patricia, MD      Allergies    Penicillins and Sulfamethoxazole    Review of Systems   Review of Systems  Cardiovascular:  Positive for chest pain.    Physical Exam Updated Vital Signs BP (!) 105/93 (BP Location: Right Arm)   Pulse 86   Temp (!) 97.5 F (36.4 C) (Oral)   Resp 20   Ht 5\' 9"  (1.753 m)   Wt (!) 173.7 kg   SpO2 92%   BMI 56.56 kg/m  Physical Exam Vitals and nursing note reviewed.  Constitutional:      Appearance: He is well-developed.  HENT:     Head: Normocephalic and atraumatic.  Eyes:     Pupils: Pupils are equal, round, and reactive to light.  Neck:     Vascular: No JVD.  Cardiovascular:     Rate and Rhythm: Normal rate and regular rhythm.     Heart sounds: No murmur heard.    No friction rub. No gallop.  Pulmonary:     Effort: No respiratory distress.     Breath sounds: No wheezing.  Chest:  Chest wall: Tenderness present.     Comments: Pain along the left anterior chest wall worse about the anterior axillary line about ribs 6.  Reproduces his discomfort. Abdominal:     General: There is no distension.     Tenderness: There is no abdominal tenderness. There is no guarding or rebound.  Musculoskeletal:        General: Normal range of motion.     Cervical back: Normal range of motion and neck supple.  Skin:    Coloration: Skin is not pale.     Findings: No rash.  Neurological:     Mental Status: He is alert and oriented to person, place, and time.  Psychiatric:        Behavior: Behavior normal.     ED Results / Procedures / Treatments   Labs (all labs ordered are listed, but only abnormal results are displayed) Labs Reviewed  BASIC METABOLIC PANEL - Abnormal; Notable for the following components:      Result  Value   Glucose, Bld 109 (*)    Calcium 8.7 (*)    All other components within normal limits  CBC  TROPONIN I (HIGH SENSITIVITY)  TROPONIN I (HIGH SENSITIVITY)    EKG EKG Interpretation Date/Time:  Thursday November 28 2022 13:31:05 EDT Ventricular Rate:  77 PR Interval:    QRS Duration:  80 QT Interval:  382 QTC Calculation: 432 R Axis:   -15  Text Interpretation: Atrial fibrillation Low voltage QRS Inferior infarct , age undetermined Cannot rule out Anterior infarct , age undetermined Abnormal ECG Baseline wander TECHNICALLY DIFFICULT Otherwise no significant change Confirmed by Melene Plan [phone]) on 11/28/2022 3:10:48 PM  Radiology DG Chest 2 View  Result Date: 11/28/2022 CLINICAL DATA:  Chest pain and shortness of breath EXAM: CHEST - 2 VIEW COMPARISON:  Chest radiograph dated 08/29/2021 FINDINGS: Normal lung volumes. No focal consolidations. No pneumothorax. Minimal blunting of bilateral costophrenic angles. The heart size and mediastinal contours are within normal limits. No acute osseous abnormality. IMPRESSION: Minimal blunting of bilateral costophrenic angles, which may represent trace pleural effusions. Electronically Signed   By: Agustin Cree M.D.   On: 11/28/2022 15:10    Procedures Procedures    Medications Ordered in ED Medications  ketorolac (TORADOL) 15 MG/ML injection 15 mg (15 mg Intramuscular Given 11/28/22 1554)    ED Course/ Medical Decision Making/ A&P                             Medical Decision Making Amount and/or Complexity of Data Reviewed Labs: ordered. Radiology: ordered.  Risk Prescription drug management.   72 yo M with a chief complaint of left-sided chest pain.  Atypical in nature and reproduced on exam.  Chest x-ray independently interpreted by me without focal true pneumothorax.  Troponin negative, no anemia no significant electrolyte abnormalities.  Has had symptoms greater than 6 hours without significant change do not feel delta is  warranted.  EKG without obvious ischemia.  Will treat as musculoskeletal.  PCP follow-up.  4:04 PM:  I have discussed the diagnosis/risks/treatment options with the patient.  Evaluation and diagnostic testing in the emergency department does not suggest an emergent condition requiring admission or immediate intervention beyond what has been performed at this time.  They will follow up with PCP. We also discussed returning to the ED immediately if new or worsening sx occur. We discussed the sx which are most concerning (e.g., sudden worsening pain,  fever, inability to tolerate by mouth) that necessitate immediate return. Medications administered to the patient during their visit and any new prescriptions provided to the patient are listed below.  Medications given during this visit Medications  ketorolac (TORADOL) 15 MG/ML injection 15 mg (15 mg Intramuscular Given 11/28/22 1554)     The patient appears reasonably screen and/or stabilized for discharge and I doubt any other medical condition or other [facility] requiring further screening, evaluation, or treatment in the ED at this time prior to discharge.          Final Clinical Impression(s) / ED Diagnoses Final diagnoses:  Nonspecific chest pain    Rx / DC Orders ED Discharge Orders          Ordered    Ambulatory referral to Cardiology       Comments: If you have not heard from the Cardiology office within the next 72 hours please call [phone].   11/28/22 1533    diclofenac Sodium (VOLTAREN) 1 % GEL  4 times daily        11/28/22 1533              Melene Plan, DO 11/28/22 1604

## 2022-11-28 NOTE — ED Triage Notes (Signed)
Touches left chest as location of chest pain, described as aching.  Patient appears sob.  Chest pain started this morning.  Has not taken any specific medicines for this aching pain in left ches

## 2022-12-04 ENCOUNTER — Telehealth: Payer: Self-pay

## 2022-12-04 NOTE — Telephone Encounter (Signed)
Transition Care Management Follow-up Telephone Call Date of discharge and from where: 11/28/2022 [facility] How have you been since you were released from the hospital? Patient stated he is feeling much better. Any questions or concerns? No  Items Reviewed: Did the pt receive and understand the discharge instructions provided? Yes  Medications obtained and verified?  Patient stated he had Voltaren at home not sure of expiration date. He will pick up his prescription today. He has transportation. Other? No  Any new allergies since your discharge? No  Dietary orders reviewed? Yes Do you have support at home? Yes   Follow up appointments reviewed:  PCP Hospital f/u appt confirmed? No  Scheduled to see  on  @ . Specialist Hospital f/u appt confirmed? No  Scheduled to see  on  @ . Are transportation arrangements needed? No  If their condition worsens, is the pt aware to call PCP or go to the Emergency Dept.? Yes Was the patient provided with contact information for the PCP's office or ED? Yes Was to pt encouraged to call back with questions or concerns? Yes  Arizbeth Cawthorn [facility]  [facility] Population Health Community Resource Care Guide   [email or web address]  ?? [phone]   Website: [email or web address]  [email or web address]

## 2022-12-04 NOTE — Telephone Encounter (Signed)
Transition Care Management Unsuccessful Follow-up Telephone Call  Date of discharge and from where:  11/28/2022 [facility]  Attempts:  1st Attempt  Reason for unsuccessful TCM follow-up call:  No answer/busy  Lanyia Jewel [facility]  [facility] Population Health Community Resource Care Guide   [email or web address]  ?? [phone]   Website: [email or web address]  [email or web address]

## 2022-12-17 ENCOUNTER — Other Ambulatory Visit: Payer: Self-pay

## 2022-12-17 MED ORDER — FUROSEMIDE 40 MG PO TABS: ORAL_TABLET | ORAL | 0 refills | Status: DC

## 2022-12-18 ENCOUNTER — Ambulatory Visit
Admission: RE | Admit: 2022-12-18 | Discharge: 2022-12-18 | Disposition: A | Discharge status: Home / Self Care | Payer: Medicare Other | Source: Ambulatory Visit | Attending: Internal Medicine | Admitting: Internal Medicine

## 2022-12-18 DIAGNOSIS — F17210 Nicotine dependence, cigarettes, uncomplicated: Secondary | ICD-10-CM

## 2022-12-18 DIAGNOSIS — Z122 Encounter for screening for malignant neoplasm of respiratory organs: Secondary | ICD-10-CM

## 2022-12-18 DIAGNOSIS — Z87891 Personal history of nicotine dependence: Secondary | ICD-10-CM

## 2022-12-23 ENCOUNTER — Telehealth: Payer: Self-pay | Admitting: Internal Medicine

## 2022-12-23 DIAGNOSIS — M25552 Pain in left hip: Secondary | ICD-10-CM

## 2022-12-23 NOTE — Telephone Encounter (Signed)
Requesting referral to ortho for pain between left thigh and waist, says previously he had been receiving shots at ortho

## 2022-12-23 NOTE — Telephone Encounter (Signed)
Referral placed.

## 2022-12-24 ENCOUNTER — Other Ambulatory Visit (INDEPENDENT_AMBULATORY_CARE_PROVIDER_SITE_OTHER): Payer: Medicare Other

## 2022-12-24 ENCOUNTER — Ambulatory Visit (INDEPENDENT_AMBULATORY_CARE_PROVIDER_SITE_OTHER): Payer: Medicare Other | Admitting: Physician Assistant

## 2022-12-24 ENCOUNTER — Encounter: Payer: Self-pay | Admitting: Physician Assistant

## 2022-12-24 DIAGNOSIS — M1712 Unilateral primary osteoarthritis, left knee: Secondary | ICD-10-CM | POA: Diagnosis not present

## 2022-12-24 DIAGNOSIS — M1612 Unilateral primary osteoarthritis, left hip: Secondary | ICD-10-CM

## 2022-12-24 DIAGNOSIS — M1711 Unilateral primary osteoarthritis, right knee: Secondary | ICD-10-CM

## 2022-12-24 DIAGNOSIS — M170 Bilateral primary osteoarthritis of knee: Secondary | ICD-10-CM | POA: Diagnosis not present

## 2022-12-24 MED ORDER — METHYLPREDNISOLONE ACETATE 40 MG/ML IJ SUSP
13.3300 mg | INTRAMUSCULAR | Status: AC | PRN
Start: 2022-12-24 — End: 2022-12-24
  Administered 2022-12-24: 13.33 mg via INTRA_ARTICULAR

## 2022-12-24 MED ORDER — TRAMADOL HCL 50 MG PO TABS: 50.0000 mg | ORAL_TABLET | Freq: Two times a day (BID) | ORAL | 1 refills | Status: DC | PRN

## 2022-12-24 MED ORDER — BUPIVACAINE HCL 0.25 % IJ SOLN
0.6600 mL | INTRAMUSCULAR | Status: AC | PRN
Start: 2022-12-24 — End: 2022-12-24
  Administered 2022-12-24: .66 mL via INTRA_ARTICULAR

## 2022-12-24 MED ORDER — LIDOCAINE HCL 1 % IJ SOLN
3.0000 mL | INTRAMUSCULAR | Status: AC | PRN
Start: 2022-12-24 — End: 2022-12-24
  Administered 2022-12-24: 3 mL

## 2022-12-24 NOTE — Progress Notes (Signed)
Office Visit Note   Patient: Derrick Mata           Date of Birth: 1951/04/24           MRN: [id number] Visit Date: 12/24/2022              Requested by: Philip Aspen, Limmie Patricia, MD [address] [place],  [place] [address] PCP: Philip Aspen, Limmie Patricia, MD   Assessment & Plan: Visit Diagnoses:  1. Unilateral primary osteoarthritis, left hip   2. Bilateral primary osteoarthritis of knee     Plan: Impression is left hip and bilateral knee osteoarthritis.  In regards to the left hip, we have discussed referral to Dr. Shon Baton for ultrasound-guided cortisone injection.  Referrals been made.  In regards to his knees, patient would like to proceed with bilateral knee cortisone injection today.  He will make all efforts at weight loss which I think will help as well.  He does tell me he is not a surgical candidate based on his multiple comorbidities.  He will follow-up with [place] as needed.  Follow-Up Instructions: Return if symptoms worsen or fail to improve.   Orders:  Orders Placed This Encounter  Procedures   Large Joint Inj: bilateral knee   XR HIP UNILAT W OR W/O PELVIS 2-3 VIEWS LEFT   XR KNEE 3 VIEW RIGHT   XR KNEE 3 VIEW LEFT   AMB referral to sports medicine   Meds ordered this encounter  Medications   traMADol (ULTRAM) 50 MG tablet    Sig: Take 1 tablet (50 mg total) by mouth every 12 (twelve) hours as needed.    Dispense:  30 tablet    Refill:  1      Procedures: Large Joint Inj: bilateral knee on 12/24/2022 4:27 PM Indications: pain Details: 22 G needle, anterolateral approach Medications (Right): 0.66 mL bupivacaine 0.25 %; 3 mL lidocaine 1 %; 13.33 mg methylPREDNISolone acetate 40 MG/ML Medications (Left): 0.66 mL bupivacaine 0.25 %; 3 mL lidocaine 1 %; 13.33 mg methylPREDNISolone acetate 40 MG/ML      Clinical Data: No additional findings.   Subjective: Chief Complaint  Patient presents with   Left Hip - Pain    HPI patient is a pleasant  72 year old gentleman who comes in today with left hip pain and bilateral knee pain.  In regards to his left hip, he has had pain for 3 months which has progressively worsened.  No known injury.  The pain he has is to the groin and is constant.  No specific aggravators.  He has been taking Tylenol which has not provided any relief.  In regards to his knees, history of osteoarthritis.  The pain he has is to the entire aspect of both knees worse with activity.  He has had intermittent cortisone injections to both knees in the past which have provided good but temporary relief.  He is requesting repeat cortisone injections today.  Review of Systems as detailed in HPI.  All others reviewed and are negative.   Objective: Vital Signs: There were no vitals taken for this visit.  Physical Exam well-developed well-nourished gentleman in no acute distress.  Alert and oriented x 3.  Ortho Exam left hip exam reveals pain with hip flexion and logroll.  He is neurovascularly intact distally.  Bilateral knee exam: Range of motion 0 to 95 degrees.  No joint line tenderness.  Mild patellofemoral crepitus.  He is neurovascularly intact distally.  Specialty Comments:  No specialty  comments available.  Imaging: No results found.   PMFS History: Patient Active Problem List   Diagnosis Date Noted   Macular hole, right eye 11/13/2021   Nuclear sclerotic cataract of both eyes 11/13/2021   Vitreous floaters, left 11/13/2021   Benign neoplasm of ascending colon    Portal hypertension (HCC)    Portal hypertensive gastropathy (HCC)    Esophageal ring    IGT (impaired glucose tolerance) 12/09/2018   History of colonic polyps    Recurrent major depressive disorder, in full remission (HCC) 07/07/2017   Aortic atherosclerosis (HCC) 07/02/2017   Frequent headaches 01/03/2017   Benign neoplasm of descending colon    Benign neoplasm of rectum    Hyperglycemia 02/01/2016   Hepatic cirrhosis (HCC) 12/19/2015    Super obesity 07/18/2015   Obesity hypoventilation syndrome (HCC) 07/18/2015   CHF (congestive heart failure), NYHA class I (HCC) 07/18/2015   H/O: substance abuse (HCC) 07/18/2015   OSA and COPD overlap syndrome (HCC) 07/18/2015   Noncompliance with CPAP treatment 07/18/2015   Moderate smoker (20 or less per day) 06/19/2015   Hep C w/o coma, chronic (HCC) 06/12/2015   ERECTILE DYSFUNCTION 01/31/2010   KNEE PAIN, LEFT, CHRONIC 05/18/2009   TOBACCO USE 06/23/2007   Essential hypertension 11/24/2006   Osteoarthritis 11/24/2006   OSA (obstructive sleep apnea) 11/24/2006   Past Medical History:  Diagnosis Date   Arthritis    OA of knees   CHF (congestive heart failure) (HCC)    Closed compression fracture of L1 lumbar vertebra 09/03/2016   Colon polyp    Complication of anesthesia    slow to wake up   COPD (chronic obstructive pulmonary disease) (HCC)    > 30 pack yr smoker   Depression    Anxiety and Insomnia   Diverticulosis    Erectile dysfunction    GERD (gastroesophageal reflux disease)    Hemorrhoids    Hep C w/o coma, chronic (HCC) 06/12/2015   treated with medications   Leg edema    Morbid obesity (HCC)    Sleep apnea    refuses CPAP   Substance abuse (HCC)    hx alcohol and cocaine use, 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, 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    Family History  Problem Relation Age of Onset   Breast cancer Mother    Heart disease Father 39   Colon cancer Sister    Cancer Sister 46       sister, colon    Heart disease Other    Stomach cancer Neg Hx    Rectal cancer Neg Hx    Esophageal cancer Neg Hx    Pancreatic cancer Neg Hx     Past Surgical History:  Procedure Laterality Date   COLONOSCOPY  04/18/2011   Procedure: COLONOSCOPY;  Surgeon: Yancey Flemings, MD;  Location: [facility];  Service: Endoscopy;  Laterality: N/A;   COLONOSCOPY WITH PROPOFOL N/A 05/07/2016   Procedure: COLONOSCOPY WITH PROPOFOL;  Surgeon: Hilarie Fredrickson, MD;  Location: [facility];  Service: Endoscopy;  Laterality: N/A;   COLONOSCOPY WITH PROPOFOL N/A 08/12/2017   Procedure: COLONOSCOPY WITH PROPOFOL;  Surgeon: Hilarie Fredrickson, MD;  Location: [facility];  Service: Endoscopy;  Laterality: N/A;   COLONOSCOPY WITH PROPOFOL N/A 01/25/2021   Procedure: COLONOSCOPY WITH PROPOFOL;  Surgeon: Hilarie Fredrickson, MD;  [facility]: [facility];  Service: Endoscopy;  Laterality: N/A;   ESOPHAGOGASTRODUODENOSCOPY (EGD) WITH PROPOFOL N/A 05/07/2016   Procedure: ESOPHAGOGASTRODUODENOSCOPY (EGD) WITH PROPOFOL;  Surgeon: Wilhemina Bonito  Marina Goodell, MD;  Location: [facility];  Service: Endoscopy;  Laterality: N/A;   ESOPHAGOGASTRODUODENOSCOPY (EGD) WITH PROPOFOL N/A 01/25/2021   Procedure: ESOPHAGOGASTRODUODENOSCOPY (EGD) WITH PROPOFOL;  Surgeon: Hilarie Fredrickson, MD;  [facility];  Service: Endoscopy;  Laterality: N/A;   KNEE ARTHROSCOPY  2011   left   LUMBAR LAMINECTOMY  1994, 2010   POLYPECTOMY  01/25/2021   Procedure: POLYPECTOMY;  Surgeon: Hilarie Fredrickson, MD;  Location: [facility];  Service: Endoscopy;;   TIBIA FRACTURE SURGERY  2006   hardware   TONSILLECTOMY  1963   Social History   Occupational History   Occupation: retired  Tobacco Use   Smoking status: Every Day    Current packs/day: 1.00    Average packs/day: 1 pack/day for 59.6 years (59.6 ttl pk-yrs)    Types: Cigarettes    Start date: 05/21/1963   Smokeless tobacco: Never   Tobacco comments:    not ready to quit yet  Vaping Use   Vaping status: Never Used  Substance and Sexual Activity   Alcohol use: No    Alcohol/week: 0.0 standard drinks of alcohol   Drug use: No    Comment: last dose November 2012   Sexual activity: Yes    Partners: Female    Comment: states no alcohol in 3 weeks

## 2022-12-25 ENCOUNTER — Other Ambulatory Visit: Payer: Self-pay | Admitting: Acute Care

## 2022-12-25 DIAGNOSIS — F17210 Nicotine dependence, cigarettes, uncomplicated: Secondary | ICD-10-CM

## 2022-12-25 DIAGNOSIS — Z122 Encounter for screening for malignant neoplasm of respiratory organs: Secondary | ICD-10-CM

## 2022-12-25 DIAGNOSIS — Z87891 Personal history of nicotine dependence: Secondary | ICD-10-CM

## 2023-01-02 ENCOUNTER — Encounter: Payer: Self-pay | Admitting: Sports Medicine

## 2023-01-02 ENCOUNTER — Ambulatory Visit (INDEPENDENT_AMBULATORY_CARE_PROVIDER_SITE_OTHER): Payer: Medicare Other | Admitting: Sports Medicine

## 2023-01-02 ENCOUNTER — Other Ambulatory Visit: Payer: Self-pay

## 2023-01-02 DIAGNOSIS — M170 Bilateral primary osteoarthritis of knee: Secondary | ICD-10-CM | POA: Diagnosis not present

## 2023-01-02 DIAGNOSIS — M1612 Unilateral primary osteoarthritis, left hip: Secondary | ICD-10-CM

## 2023-01-02 MED ORDER — METHYLPREDNISOLONE ACETATE 40 MG/ML IJ SUSP
40.0000 mg | INTRAMUSCULAR | Status: AC | PRN
Start: 2023-01-02 — End: 2023-01-02
  Administered 2023-01-02: 40 mg via INTRA_ARTICULAR

## 2023-01-02 NOTE — Progress Notes (Signed)
Office Visit Note   Patient: Derrick Mata           Date of Birth: June 25, 1950           MRN: [id number] Visit Date: 01/02/2023              Requested by: Tarry Kos, MD [address] [place],  [place] [address] PCP: Philip Aspen, Limmie Patricia, MD   Assessment & Plan: Visit Diagnoses:  1. Unilateral primary osteoarthritis, left hip   2. Bilateral primary osteoarthritis of knee   3. Class 3 severe obesity with serious comorbidity in adult, unspecified BMI, unspecified obesity type (HCC)    Plan: Through shared-decision making, did proceed with ultrasound guided hip injection of the left hip without any difficulty.  Discussed with patient plan going forward regarding his hip pain.  Patient was advised to make all efforts with weight loss as his surgical team advised.  Patient will evaluate how long this current injection gives him therapeutic relief for and can return in approximately 3 months for repeat injection.  If patient does not have any relief from injection then patient will follow-up with Dr. Roda Shutters for discussion regarding replacement.  Patient does have multiple comorbidities that will require evaluation prior to determining if he is a surgical candidate.  Follow-Up Instructions: Return in about 3 months (around 04/04/2023), or if symptoms worsen or fail to improve, for F/u with Mardella Layman or Dr. Roda Shutters as needed for knees; can make f/u for hip in 3 months with [facility].   Orders:  Orders Placed This Encounter  Procedures   Large Joint Inj: L hip joint   US Guided Needle Placement - No Linked Charges   No orders of the defined types were placed in this encounter.   Procedures: - See procedure note below   Clinical Data: No additional findings.   Subjective: Chief Complaint  Patient presents with   Left Hip - Injections    Patient is presenting with some left-sided hip pain has been going on for quite some time now.  Patient was recently seen by orthopedic surgery  at Ortho care office who recommended patient would benefit from a steroid injection and patient is here for steroid injection.  Patient had a long discussion with provider regarding what his options are going forward.  Patient notes that he was advised to have surgery in the past but was told that he is not a very good surgical candidate.  Patient was also advised that he should be losing some weight in order to increase the outcomes of his surgery if he were to have it.  Patient is that he will be working on that.  Patient otherwise has no other concerns at this time.  Objective:  Physical Exam Constitutional:      Appearance: Normal appearance.  Neurological:     Mental Status: He is alert.    Ortho Exam  Specialty Comments:  No specialty comments available.  Imaging:  *Left Hip XR 12/24/22: X-rays demonstrate advanced degenerative changes to the left hip joint   PMFS History: Patient Active Problem List   Diagnosis Date Noted   Macular hole, right eye 11/13/2021   Nuclear sclerotic cataract of both eyes 11/13/2021   Vitreous floaters, left 11/13/2021   Benign neoplasm of ascending colon    Portal hypertension (HCC)    Portal hypertensive gastropathy (HCC)    Esophageal ring    IGT (impaired glucose tolerance) 12/09/2018   History of colonic polyps  Recurrent major depressive disorder, in full remission (HCC) 07/07/2017   Aortic atherosclerosis (HCC) 07/02/2017   Frequent headaches 01/03/2017   Benign neoplasm of descending colon    Benign neoplasm of rectum    Hyperglycemia 02/01/2016   Hepatic cirrhosis (HCC) 12/19/2015   Super obesity 07/18/2015   Obesity hypoventilation syndrome (HCC) 07/18/2015   CHF (congestive heart failure), NYHA class I (HCC) 07/18/2015   H/O: substance abuse (HCC) 07/18/2015   OSA and COPD overlap syndrome (HCC) 07/18/2015   Noncompliance with CPAP treatment 07/18/2015   Moderate smoker (20 or less per day) 06/19/2015   Hep C w/o coma,  chronic (HCC) 06/12/2015   ERECTILE DYSFUNCTION 01/31/2010   KNEE PAIN, LEFT, CHRONIC 05/18/2009   TOBACCO USE 06/23/2007   Essential hypertension 11/24/2006   Osteoarthritis 11/24/2006   OSA (obstructive sleep apnea) 11/24/2006   Past Medical History:  Diagnosis Date   Arthritis    OA of knees   CHF (congestive heart failure) (HCC)    Closed compression fracture of L1 lumbar vertebra 09/03/2016   Colon polyp    Complication of anesthesia    slow to wake up   COPD (chronic obstructive pulmonary disease) (HCC)    > 30 pack yr smoker   Depression    Anxiety and Insomnia   Diverticulosis    Erectile dysfunction    GERD (gastroesophageal reflux disease)    Hemorrhoids    Hep C w/o coma, chronic (HCC) 06/12/2015   treated with medications   Leg edema    Morbid obesity (HCC)    Sleep apnea    refuses CPAP   Substance abuse (HCC)    hx alcohol and cocaine use, 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, 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    Family History  Problem Relation Age of Onset   Breast cancer Mother    Heart disease Father 51   Colon cancer Sister    Cancer Sister 61       sister, colon    Heart disease Other    Stomach cancer Neg Hx    Rectal cancer Neg Hx    Esophageal cancer Neg Hx    Pancreatic cancer Neg Hx     Past Surgical History:  Procedure Laterality Date   COLONOSCOPY  04/18/2011   Procedure: COLONOSCOPY;  Surgeon: Yancey Flemings, MD;  [facility];  Service: Endoscopy;  Laterality: N/A;   COLONOSCOPY WITH PROPOFOL N/A 05/07/2016   Procedure: COLONOSCOPY WITH PROPOFOL;  Surgeon: Hilarie Fredrickson, MD;  [facility];  Service: Endoscopy;  Laterality: N/A;   COLONOSCOPY WITH PROPOFOL N/A 08/12/2017   Procedure: COLONOSCOPY WITH PROPOFOL;  Surgeon: Hilarie Fredrickson, MD;  [facility];  Service: Endoscopy;  Laterality: N/A;   COLONOSCOPY WITH PROPOFOL N/A 01/25/2021   Procedure: COLONOSCOPY WITH PROPOFOL;  Surgeon: Hilarie Fredrickson, MD;  [facility];  Service: Endoscopy;  Laterality: N/A;   ESOPHAGOGASTRODUODENOSCOPY (EGD) WITH PROPOFOL N/A 05/07/2016   Procedure: ESOPHAGOGASTRODUODENOSCOPY (EGD) WITH PROPOFOL;  Surgeon: Hilarie Fredrickson, MD;  [facility];  Service: Endoscopy;  Laterality: N/A;   ESOPHAGOGASTRODUODENOSCOPY (EGD) WITH PROPOFOL N/A 01/25/2021   Procedure: ESOPHAGOGASTRODUODENOSCOPY (EGD) WITH PROPOFOL;  Surgeon: Hilarie Fredrickson, MD;  [facility];  Service: Endoscopy;  Laterality: N/A;   KNEE ARTHROSCOPY  2011   left   LUMBAR LAMINECTOMY  1994, 2010   POLYPECTOMY  01/25/2021   Procedure: POLYPECTOMY;  Surgeon: Hilarie Fredrickson, MD;  [facility]: [facility];  Service: Endoscopy;;   TIBIA  FRACTURE SURGERY  2006   hardware   TONSILLECTOMY  1963   Social History   Occupational History   Occupation: retired  Tobacco Use   Smoking status: Every Day    Current packs/day: 1.00    Average packs/day: 1 pack/day for 59.6 years (59.6 ttl pk-yrs)    Types: Cigarettes    Start date: 05/21/1963   Smokeless tobacco: Never   Tobacco comments:    not ready to quit yet  Vaping Use   Vaping status: Never Used  Substance and Sexual Activity   Alcohol use: No    Alcohol/week: 0.0 standard drinks of alcohol   Drug use: No    Comment: last dose November 2012   Sexual activity: Yes    Partners: Female    Comment: states no alcohol in 3 weeks   Medical Resident/Fellow - Attending Physician Addendum:   I have independently interviewed and examined the patient myself. I have discussed the above with the original author and agree with their documentation. My edits for correction/addition/clarification have been made, see any changes above and below.   In summary, very pleasant 72 year old male with bilateral osteoarthritis of the knees and left hip arthritis.  Was sent to [place] for further evaluation of the hip and possible injection.  Through shared decision making did proceed with ultrasound-guided left hip injection  with Dr. Benjiman Core, see procedure note below.  We had a further discussion regarding additional treatment options, he does have several medical comorbidities which would likely prevent elective surgical replacement for either the knees or the hips.  He will continue working on weight loss as well which will offload both the knees and the hips.  He was prescribed tramadol but states this does not touch his pain.  I am okay with him discontinuing this if he does not find it helpful.  Okay for over-the-counter anti-inflammatories.  He will keep routine follow-up with Mardella Layman and Dr. Roda Shutters, we can see him back at 18-month intervals if he wishes to proceed with repeat hip injection.  Madelyn Brunner, DO Primary Care Sports Medicine Physician  [facility] - Orthopedics  This note was dictated using Dragon naturally speaking software and may contain errors in syntax, spelling, or content which have not been identified prior to signing this note.    Madelyn Brunner, DO Primary Care Sports Medicine Physician  [facility] - Orthopedics

## 2023-01-02 NOTE — Progress Notes (Signed)
   Procedure Note  Patient: Derrick Mata             Date of Birth: 09-25-1950           MRN: [id number]             Visit Date: 01/02/2023  Procedures: Visit Diagnoses:  1. Unilateral primary osteoarthritis, left hip    Procedure: US-guided intra-articular hip injection, left  After discussion on risks/benefits/indications and informed verbal consent was obtained, a timeout was performed. Patient was lying supine on exam table. The hip was cleaned with betadine and alcohol swabs. Then utilizing ultrasound guidance, the patient's femoral head and neck junction was identified and subsequently injected with 4:2 lidocaine:depomedrol via an in-plane approach with ultrasound visualization of the injectate administered into the hip joint. Patient tolerated procedure well without immediate complications.  Large Joint Inj: L hip joint on 01/02/2023 1:33 PM Indications: pain Details: 22 G 3.5 in needle, ultrasound-guided anterior approach Medications: 40 mg methylPREDNISolone acetate 40 MG/ML Outcome: tolerated well, no immediate complications Procedure, treatment alternatives, risks and benefits explained, specific risks discussed. Consent was given by the patient. Immediately prior to procedure a time out was called to verify the correct patient, procedure, equipment, support staff and site/side marked as required. Patient was prepped and draped in the usual sterile fashion.     Medical Resident/Fellow - Attending Physician Addendum:   I was present throughout the visit and was in the room and did help assist with the procedure today with Dr. Benjiman Core.  Madelyn Brunner, DO Primary Care Sports Medicine Physician  [facility] - Orthopedics

## 2023-01-06 NOTE — Progress Notes (Deleted)
Cardiology Clinic Note   Date: 01/06/2023 ID: Mal Griscom, DOB 09-Jul-1950, MRN [id number]  Primary Cardiologist:  Olga Millers, MD  Patient Profile    Rayshad Biddix is a 72 y.o. male who presents to the clinic today for ***    Past medical history significant for: Chronic combined systolic and diastolic heart failure. Echo 04/08/2019: EF 35 to 40%.  Mild LVH.  Grade I DD.  Trace MR.  Trivial TR.  Mild aortic valve sclerosis without stenosis.  Mild dilatation of ascending aorta 39 mm. Portal hypertension. Hypertension. Hyperlipidemia. Lipid panel 11/07/2022: LDL 64, HDL 33, TG 73, total 112. OSA. COPD. Cirrhosis. Hep C.     History of Present Illness    Hovsep Sine was first evaluated by Dr. Jens Som on 03/19/2018 for lower extremity edema at the request of Dr. Selena Batten.  Echo showed EF 40 to 45%, grade 1 DD, biatrial enlargement, RVH.  Nuclear stress test January 2020 showed no ischemia.  Repeat echo after GDMT titration November 2020 showed EF 35 to 40%, Grade I DD, normal RV, no mention of BAE, ascending aorta 39 mm.  Patient was last seen in the office by Bettina Gavia, PA-C.  He was seen on 01/30/2022 for routine follow-up.  He had increased lower extremity edema progressing over 3 months but no complaints of dyspnea.  He reported being out of Entresto and taking Lasix 40 mg twice daily x 5 days with some improvement in lower extremity edema.  Upon further questioning it was discovered that he had been taking Entresto only once per day x 1 month.  Sherryll Burger was restarted.  Patient was instructed to take Lasix 80 mg in the a.m. and 40 mg in the afternoon x 7 days then 80 mg daily thereafter.  He was followed closely with return visit on 02/12/2022.  Lower extremity edema present but much improved at that time.  He continued to deny dyspnea.  He was maintained on Lasix 80 mg daily.  Patient presented to urgent care on 11/28/2022 with complaints of left-sided chest pain.  Vitals  were stable and EKG showed NSR.  His breathing was labored so he was sent to the emergency room.  He had trouble describing his pain but reported it was nonradiating and worse with deep breathing and movement.  Pain was reproducible with palpation.  Troponin was negative, no infection or anemia, no electrolyte derangement.  He was discharged home.  Today, patient ***  Chronic combined systolic and diastolic heart failure.  Echo November 2020 showed EF 35 to 40%, mild LVH, Grade I DD.  Patient*** Euvolemic and well compensated on exam.  Continue Entresto, metoprolol, Lasix, spironolactone. Hypertension: BP today *** Patient denies headaches, dizziness or vision changes. Continue metoprolol, Entresto, spironolactone. Hyperlipidemia.  LDL June 2024 64, at goal.  Continue rosuvastatin.   ROS: All other systems reviewed and are otherwise negative except as noted in History of Present Illness.  Studies Reviewed       ***  Risk Assessment/Calculations    {Does this patient have ATRIAL FIBRILLATION?:[phone]} No BP recorded.  {Refresh Note OR Click here to enter BP  :1}***        Physical Exam    VS:  There were no vitals taken for this visit. , BMI There is no height or weight on file to calculate BMI.  GEN: Well nourished, well developed, in no acute distress. Neck: No JVD or carotid bruits. Cardiac: *** RRR. No murmurs. No rubs  or gallops.   Respiratory:  Respirations regular and unlabored. Clear to auscultation without rales, wheezing or rhonchi. GI: Soft, nontender, nondistended. Extremities: Radials/DP/PT 2+ and equal bilaterally. No clubbing or cyanosis. No edema ***  Skin: Warm and dry, no rash. Neuro: Strength intact.  Assessment & Plan   ***  Disposition: ***     {Are you ordering a CV Procedure (e.g. stress test, cath, DCCV, TEE, etc)?   Press F2        :[id number]}   Signed, Etta Grandchild. Gyan Cambre, DNP, NP-C

## 2023-01-08 ENCOUNTER — Ambulatory Visit: Payer: Medicare Other | Admitting: Student

## 2023-01-16 ENCOUNTER — Other Ambulatory Visit: Payer: Self-pay | Admitting: Internal Medicine

## 2023-01-20 ENCOUNTER — Other Ambulatory Visit: Payer: Self-pay | Admitting: Internal Medicine

## 2023-01-20 DIAGNOSIS — G4700 Insomnia, unspecified: Secondary | ICD-10-CM

## 2023-01-25 NOTE — Progress Notes (Unsigned)
Cardiology Clinic Note   Date: 01/27/2023 ID: Derrick Mata, DOB January 11, 1951, MRN [id number]  Primary Cardiologist:  Derrick Millers, MD  Patient Profile    Derrick Mata is a 72 y.o. male who presents to the clinic today for routine follow up.     Past medical history significant for: Chronic combined systolic and diastolic heart failure. Echo 04/08/2019: EF 35 to 40%.  Mild LVH.  Grade I DD.  Trace MR.  Trivial TR.  Mild aortic valve sclerosis without stenosis.  Mild dilatation of ascending aorta 39 mm. Portal hypertension. Hypertension. Hyperlipidemia. Lipid panel 11/07/2022: LDL 64, HDL 33, TG 73, total 112. OSA. COPD. Cirrhosis. Hep C. Tobacco abuse.      History of Present Illness    Derrick Mata was first evaluated by Derrick Mata on 03/19/2018 for lower extremity edema at the request of Dr. Selena Batten.  Echo showed EF 40 to 45%, grade 1 DD, biatrial enlargement, RVH.  Nuclear stress test January 2020 showed no ischemia.  Repeat echo after GDMT titration November 2020 showed EF 35 to 40%, Grade I DD, normal RV, no mention of BAE, ascending aorta 39 mm.  Patient was last seen in the office by Derrick Gavia, PA-C.  He was seen on 01/30/2022 for routine follow-up.  He had increased lower extremity edema progressing over 3 months but no complaints of dyspnea.  He reported being out of Entresto and taking Lasix 40 mg twice daily x 5 days with some improvement in lower extremity edema.  Upon further questioning it was discovered that he had been taking Entresto only once per day x 1 month.  Sherryll Burger was restarted.  Patient was instructed to take Lasix 80 mg in the a.m. and 40 mg in the afternoon x 7 days then 80 mg daily thereafter.  He was followed closely with return visit on 02/12/2022.  Lower extremity edema present but much improved at that time.  He continued to deny dyspnea.  He was maintained on Lasix 80 mg daily.  Patient presented to urgent care on 11/28/2022 with complaints of  left-sided chest pain.  Vitals were stable.  His breathing was labored so he was sent to the emergency room.  He had trouble describing his pain but reported it was nonradiating and worse with deep breathing and movement.  Pain was reproducible with palpation.  Troponin was negative, no infection or anemia, no electrolyte derangement.  He was discharged home.  Today, patient is here alone. He has lost ~30 lb since last year. No chest pain, pressure, or tightness. He denies lower extremity edema. No palpitations. He has baseline shortness of breath and DOE secondary to COPD. He continues to smoke 1 pack per day. He has chronic upper airway congestion. He sleeps in a recliner secondary to chronic pain. He does not use CPAP. No PND. Activity is limited by chronic back/hip pain and knee pain.     ROS: All other systems reviewed and are otherwise negative except as noted in History of Present Illness.  Studies Reviewed    EKG Interpretation Date/Time:  Monday January 27 2023 10:21:49 EDT Ventricular Rate:  78 PR Interval:    QRS Duration:  92 QT Interval:  364 QTC Calculation: 414 R Axis:   -35  Text Interpretation: Atrial fibrillation Left axis deviation Possible Anterior infarct (cited on or before 28-Nov-2022) When compared with ECG of 28-Nov-2022 13:31, No significant change was found Confirmed by Derrick Mata [phone]) on 01/27/2023 10:35:09 AM  Physical Exam    VS:  BP 128/82 (BP Location: Right Arm, Patient Position: Sitting)   Pulse 78   Ht 5\' 9"  (1.753 m)   Wt (!) 375 lb (170.1 kg)   SpO2 97%   BMI 55.38 kg/m  , BMI Body mass index is 55.38 kg/m.  GEN: Well nourished, well developed, in no acute distress. Neck: No JVD or carotid bruits. Cardiac:  RRR. No murmurs. No rubs or gallops.   Respiratory:  Respirations regular and unlabored. Clear to auscultation without rales, wheezing or rhonchi. GI: Soft, nontender, nondistended. Extremities: Radials/DP/PT 2+ and equal  bilaterally. No clubbing or cyanosis. Mild edema bilateral ankles.   Skin: Warm and dry, no rash. Neuro: Strength intact.  Assessment & Plan    Chronic combined systolic and diastolic heart failure.  Echo November 2020 showed EF 35 to 40%, mild LVH, Grade I DD.  Patient reports baseline shortness of breath and DOE secondary to COPD. He has lost 30 lb since last year. Unable to determine orthopnea as patient sleeps in a recliner for comfort. No PND. He has mild, non-pitting edema. Rhonchi cleared with cough otherwise clear breath sounds without wheezing or rales.  Euvolemic and well compensated on exam.  Continue Entresto, metoprolol, Lasix, spironolactone. Hypertension: BP today 128/82. Patient denies headaches, dizziness or vision changes. Continue metoprolol, Entresto, spironolactone. Hyperlipidemia.  LDL June 2024 64, at goal.  Continue rosuvastatin. Tobacco abuse. Patient continues to smoke 1 pack of cigarettes a day. He states it is his "one bad habit and he is not interested in quitting.   ADDENDUM: New onset afib. Patient's EKG from ED visit 11/28/2022 showed Afib, rate 77 bpm. Today patient's EKG shows Afib  with controlled ventricular rate. Patient left office before this could be discussed. He was contacted via telephone on 01/28/2023. Spoke with patient's daughter, [name],  He will be started on Eliquis 5 mg bid. Echo will be ordered as well as 7 day ZIO to determine afib burden. [name] voiced understanding.  Discussed with Derrick Mata who is in agreement with plan.   Disposition: Echo. 7 day ZIO. Return in 1 month or sooner as needed.          Signed, Etta Grandchild. Hinley Brimage, DNP, NP-C

## 2023-01-27 ENCOUNTER — Encounter: Payer: Self-pay | Admitting: Student

## 2023-01-27 ENCOUNTER — Ambulatory Visit: Payer: Medicare Other | Attending: Student | Admitting: Student

## 2023-01-27 ENCOUNTER — Other Ambulatory Visit: Payer: Self-pay | Admitting: *Deleted

## 2023-01-27 VITALS — BP 128/82 | HR 78 | Ht 69.0 in | Wt 375.0 lb

## 2023-01-27 DIAGNOSIS — I5042 Chronic combined systolic (congestive) and diastolic (congestive) heart failure: Secondary | ICD-10-CM

## 2023-01-27 DIAGNOSIS — E785 Hyperlipidemia, unspecified: Secondary | ICD-10-CM | POA: Diagnosis not present

## 2023-01-27 DIAGNOSIS — I4891 Unspecified atrial fibrillation: Secondary | ICD-10-CM

## 2023-01-27 DIAGNOSIS — I10 Essential (primary) hypertension: Secondary | ICD-10-CM | POA: Diagnosis not present

## 2023-01-27 DIAGNOSIS — Z720 Tobacco use: Secondary | ICD-10-CM

## 2023-01-27 DIAGNOSIS — I5043 Acute on chronic combined systolic (congestive) and diastolic (congestive) heart failure: Secondary | ICD-10-CM

## 2023-01-27 MED ORDER — ENTRESTO 24-26 MG PO TABS
ORAL_TABLET | ORAL | 3 refills | Status: DC
Start: 2023-01-27 — End: 2024-02-20

## 2023-01-27 MED ORDER — SACUBITRIL-VALSARTAN 24-26 MG PO TABS
1.0000 | ORAL_TABLET | Freq: Two times a day (BID) | ORAL | 0 refills | Status: DC
Start: 2023-01-27 — End: 2023-04-21

## 2023-01-27 NOTE — Patient Instructions (Signed)
Medication Instructions:  Start Eliquis 5mg . Take one tablet twice daily.  ONE BOX OF ELIQUIS 5MG  GIVEN INCLUDING 14 TABLETS. TAKE ONE TABLET TWICE DAILY.  *If you need a refill on your cardiac medications before your next appointment, please call your pharmacy*   Lab Work: None ordered during today's encounter If you have labs (blood work) drawn today and your tests are completely normal, you will receive your results only by: MyChart Message (if you have MyChart) OR A paper copy in the mail If you have any lab test that is abnormal or we need to change your treatment, we will call you to review the results.   Testing/Procedures: Your physician has requested that you have an echocardiogram. Echocardiography is a painless test that uses sound waves to create images of your heart. It provides your doctor with information about the size and shape of your heart and how well your heart's chambers and valves are working. This procedure takes approximately one hour. There are no restrictions for this procedure. Please do NOT wear cologne, aftershave, or lotions (deodorant is allowed).  Please arrive 15 minutes prior to your appointment time.    ZIO XT- Long Term Monitor Instructions   Your physician has requested you wear your ZIO patch monitor____7___days.   This is a single patch monitor.  [employer] supplies one patch monitor per enrollment.  Additional stickers are not available.   Please do not apply patch if you will be having a Nuclear Stress Test, Echocardiogram, Cardiac CT, MRI, or Chest Xray during the time frame you would be wearing the monitor. The patch cannot be worn during these tests.  You cannot remove and re-apply the ZIO XT patch monitor.   Your ZIO patch monitor will be sent USPS Priority mail from [facility] directly to your home address. The monitor may also be mailed to a PO BOX if home delivery is not available.   It may take 3-5 days to receive your monitor  after you have been enrolled.   Once you have received you monitor, please review enclosed instructions.  Your monitor has already been registered assigning a specific monitor serial # to you.   Applying the monitor   Shave hair from upper left chest.   Hold abrader disc by orange tab.  Rub abrader in 40 strokes over left upper chest as indicated in your monitor instructions.   Clean area with 4 enclosed alcohol pads .  Use all pads to assure are is cleaned thoroughly.  Let dry.   Apply patch as indicated in monitor instructions.  Patch will be place under collarbone on left side of chest with arrow pointing upward.   Rub patch adhesive wings for 2 minutes.Remove white label marked "1".  Remove white label marked "2".  Rub patch adhesive wings for 2 additional minutes.   While looking in a mirror, press and release button in center of patch.  A small green light will flash 3-4 times .  This will be your only indicator the monitor has been turned on.     Do not shower for the first 24 hours.  You may shower after the first 24 hours.   Press button if you feel a symptom. You will hear a small click.  Record Date, Time and Symptom in the Patient Log Book.   When you are ready to remove patch, follow instructions on last 2 pages of Patient Log Book.  Stick patch monitor onto last page of Patient Log Book.  Place Patient Log Book in [place] box.  Use locking tab on box and tape box closed securely.  The [employer] and [employer] has [employer] on it.  Please place in mailbox as soon as possible.  Your physician should have your test results approximately 7 days after the monitor has been mailed back to [facility].   Call [facility] Customer Care at [phone] if you have questions regarding your ZIO XT patch monitor.  Call them immediately if you see an orange light blinking on your monitor.   If your monitor falls off in less than 4 days contact our Monitor department at  [phone].  If your monitor becomes loose or falls off after 4 days call [employer] at [phone] for suggestions on securing your monitor.     Follow-Up: At [facility], you and your health needs are our priority.  As part of our continuing mission to provide you with exceptional heart care, we have created designated Provider Care Teams.  These Care Teams include your primary Cardiologist (physician) and Advanced Practice Providers (APPs -  Physician Assistants and Nurse Practitioners) who all work together to provide you with the care you need, when you need it.  We recommend signing up for the patient portal called "MyChart".  Sign up information is provided on this After Visit Summary.  MyChart is used to connect with patients for Virtual Visits (Telemedicine).  Patients are able to view lab/test results, encounter notes, upcoming appointments, etc.  Non-urgent messages can be sent to your provider as well.   To learn more about what you can do with MyChart, go to [email or web address].    Your next appointment:   1 month(s)  Provider:   Olga Millers, MD     Other Instructions Patient Assistance forms given for [facility]. Forms have ben signed by the provider.

## 2023-01-28 ENCOUNTER — Encounter: Payer: Self-pay | Admitting: Student

## 2023-01-28 MED ORDER — APIXABAN 5 MG PO TABS: 5.0000 mg | ORAL_TABLET | Freq: Two times a day (BID) | ORAL | 0 refills | Status: DC

## 2023-01-28 MED ORDER — APIXABAN 5 MG PO TABS
5.0000 mg | ORAL_TABLET | Freq: Two times a day (BID) | ORAL | Status: DC
Start: 2023-01-28 — End: 2023-04-21

## 2023-01-29 ENCOUNTER — Telehealth: Payer: Self-pay | Admitting: Cardiology

## 2023-01-29 NOTE — Telephone Encounter (Signed)
Patient states that he is returning call and is requesting call back.

## 2023-01-29 NOTE — Telephone Encounter (Signed)
Spoke with patient and he states he was returning call to schedule echo.  Please call to schedule echo

## 2023-01-30 ENCOUNTER — Ambulatory Visit: Payer: Medicare Other | Attending: Student

## 2023-01-30 DIAGNOSIS — I4891 Unspecified atrial fibrillation: Secondary | ICD-10-CM

## 2023-01-30 NOTE — Progress Notes (Unsigned)
Enrolled patient for a 7 day Zio XT monitor to be mailed to patients home   [place] to read 

## 2023-02-02 DIAGNOSIS — I4891 Unspecified atrial fibrillation: Secondary | ICD-10-CM

## 2023-02-05 ENCOUNTER — Other Ambulatory Visit: Payer: Self-pay

## 2023-02-05 MED ORDER — METOPROLOL SUCCINATE ER 25 MG PO TB24: ORAL_TABLET | ORAL | 3 refills | Status: DC

## 2023-02-13 ENCOUNTER — Telehealth: Payer: Self-pay | Admitting: Internal Medicine

## 2023-02-13 NOTE — Telephone Encounter (Signed)
Pt says cardiologist recommended he get a referral to pulmonologist

## 2023-02-18 NOTE — Telephone Encounter (Signed)
Patient is aware 

## 2023-02-20 ENCOUNTER — Other Ambulatory Visit: Payer: Self-pay | Admitting: Internal Medicine

## 2023-02-20 ENCOUNTER — Telehealth: Payer: Self-pay | Admitting: Cardiology

## 2023-02-20 DIAGNOSIS — F3342 Major depressive disorder, recurrent, in full remission: Secondary | ICD-10-CM

## 2023-02-20 NOTE — Telephone Encounter (Signed)
Spoke with patient about Orthoptist for [employer]. Pt interested [facility] approved. Info called into pharmacy. Patient very appreciative of the help.  Card No. [id number]  BIN [id number]  PCN [id number]  PC Group [id number]

## 2023-02-20 NOTE — Telephone Encounter (Signed)
Spoke with patient and he states he is in a doughnut hole for his Entresto. He states its $600 and he can not afford it. He states he has been out for over a week. He would like to now what is going on with his patient assistance. States his daughter picked up letter last week but he has not heard anything yet. He is also requesting samples

## 2023-02-20 NOTE — Telephone Encounter (Signed)
Pt c/o medication issue:  1. Name of Medication: sacubitril-valsartan (ENTRESTO) 24-26 MG   2. How are you currently taking this medication (dosage and times per day)?  Take 1 tablet by mouth 2 (two) times daily.       3. Are you having a reaction (difficulty breathing--STAT)? No  4. What is your medication issue? Pt is requesting a callback regarding needing assistance with this medication. He stated its $600.00 for 90 day refill. Please advise

## 2023-03-03 ENCOUNTER — Ambulatory Visit (HOSPITAL_COMMUNITY): Payer: Medicare Other | Attending: Student

## 2023-03-03 DIAGNOSIS — I4891 Unspecified atrial fibrillation: Secondary | ICD-10-CM | POA: Diagnosis not present

## 2023-03-03 LAB — ECHOCARDIOGRAM COMPLETE
Area-P 1/2: 3.1 cm2
P 1/2 time: 311 ms
S' Lateral: 3.6 cm

## 2023-03-07 NOTE — Progress Notes (Signed)
HPI: Follow-up edema and cardiomyopathy. 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 Seen for edema October 2019.  Follow-up echo November 2019 showed ejection fraction 40 to 45% and mild diastolic dysfunction.  Biatrial enlargement.  Severe right ventricular enlargement. Nuclear study January 2020 showed ejection fraction 47% and no ischemia or infarction.  Abdominal ultrasound September 2020 showed no aneurysm.  Chest CT July 2024 showed aortic atherosclerosis, coronary calcification and enlarged pulmonary trunk; also with emphysema. Follow-up echocardiogram October 2024 showed normal LV function, mild left ventricular hypertrophy mild aortic insufficiency.  Recently found to be in atrial fibrillation.  Monitor October 2024 showed atrial fibrillation rate controlled. Since last seen patient denies dyspnea, chest pain, palpitations, syncope or bleeding.  Current Outpatient Medications  Medication Sig Dispense Refill   apixaban (ELIQUIS) 5 MG TABS tablet TAKE 1 TABLET(5 MG) BY MOUTH TWICE DAILY 60 tablet 5   aspirin EC 81 MG tablet Take 81 mg by mouth at bedtime.     buPROPion (WELLBUTRIN XL) 150 MG 24 hr tablet TAKE 1 TABLET(150 MG) BY MOUTH DAILY 90 tablet 1   diclofenac Sodium (VOLTAREN) 1 % GEL Apply 4 g topically 4 (four) times daily. 100 g 0   furosemide (LASIX) 40 MG tablet TAKE 2 TABLETS(80 MG) BY MOUTH DAILY IN THE MORNING 180 tablet 0   metoprolol succinate (TOPROL-XL) 25 MG 24 hr tablet TAKE 1 TABLET BY MOUTH DAILY 90 tablet 3   Multiple Vitamin (MULTIVITAMIN WITH MINERALS) TABS tablet Take 1 tablet by mouth daily.     Omega-3 Fatty Acids (FISH OIL) 300 MG CAPS Take by mouth.     omeprazole (PRILOSEC) 20 MG capsule TAKE 1 CAPSULE(20 MG) BY MOUTH DAILY 90 capsule 1   rosuvastatin (CRESTOR) 40 MG tablet Take 1 tablet (40 mg total) by mouth daily. 90 tablet 3   sacubitril-valsartan (ENTRESTO) 24-26 MG TAKE 1 TABLET BY MOUTH  TWICE DAILY. 180 tablet 3   spironolactone (ALDACTONE) 25 MG tablet Take 1 tablet (25 mg total) by mouth daily. 90 tablet 3   traZODone (DESYREL) 150 MG tablet TAKE 1 TABLET(150 MG) BY MOUTH AT BEDTIME 90 tablet 0   albuterol (VENTOLIN HFA) 108 (90 Base) MCG/ACT inhaler INHALE 2 PUFFS INTO THE LUNGS EVERY 6 HOURS AS NEEDED FOR WHEEZING OR SHORTNESS OF BREATH (Patient not taking: Reported on 03/20/2023) 6.7 g 0   apixaban (ELIQUIS) 5 MG TABS tablet Take 1 tablet (5 mg total) by mouth 2 (two) times daily. (Patient not taking: Reported on 03/20/2023)     sacubitril-valsartan (ENTRESTO) 24-26 MG Take 1 tablet by mouth 2 (two) times daily. (Patient not taking: Reported on 03/20/2023) 28 tablet 0   traMADol (ULTRAM) 50 MG tablet Take 1 tablet (50 mg total) by mouth every 12 (twelve) hours as needed. (Patient not taking: Reported on 03/20/2023) 30 tablet 1   No current facility-administered medications for this visit.     Past Medical History:  Diagnosis Date   Arthritis    OA of knees   CHF (congestive heart failure) (HCC)    Closed compression fracture of L1 lumbar vertebra 09/03/2016   Colon polyp    Complication of anesthesia    slow to wake up   COPD (chronic obstructive pulmonary disease) (HCC)    > 30 pack yr smoker   Depression    Anxiety and Insomnia   Diverticulosis    Erectile dysfunction    GERD (gastroesophageal reflux disease)  Hemorrhoids    Hep C w/o coma, chronic (HCC) 06/12/2015   treated with medications   Leg edema    Morbid obesity (HCC)    Sleep apnea    refuses CPAP   Substance abuse (HCC)    hx alcohol and cocaine use, 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, 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    Past Surgical History:  Procedure Laterality Date   COLONOSCOPY  04/18/2011   Procedure: COLONOSCOPY;  Surgeon: Yancey Flemings, MD;  Location: [facility];  Service: Endoscopy;  Laterality: N/A;   COLONOSCOPY WITH PROPOFOL N/A 05/07/2016   Procedure: COLONOSCOPY WITH PROPOFOL;   Surgeon: Hilarie Fredrickson, MD;  Location: [facility];  Service: Endoscopy;  Laterality: N/A;   COLONOSCOPY WITH PROPOFOL N/A 08/12/2017   Procedure: COLONOSCOPY WITH PROPOFOL;  Surgeon: Hilarie Fredrickson, MD;  Location: [facility];  Service: Endoscopy;  Laterality: N/A;   COLONOSCOPY WITH PROPOFOL N/A 01/25/2021   Procedure: COLONOSCOPY WITH PROPOFOL;  Surgeon: Hilarie Fredrickson, MD;  Location: [facility];  Service: Endoscopy;  Laterality: N/A;   ESOPHAGOGASTRODUODENOSCOPY (EGD) WITH PROPOFOL N/A 05/07/2016   Procedure: ESOPHAGOGASTRODUODENOSCOPY (EGD) WITH PROPOFOL;  Surgeon: Hilarie Fredrickson, MD;  Location: [facility];  Service: Endoscopy;  Laterality: N/A;   ESOPHAGOGASTRODUODENOSCOPY (EGD) WITH PROPOFOL N/A 01/25/2021   Procedure: ESOPHAGOGASTRODUODENOSCOPY (EGD) WITH PROPOFOL;  Surgeon: Hilarie Fredrickson, MD;  Location: [facility];  Service: Endoscopy;  Laterality: N/A;   KNEE ARTHROSCOPY  2011   left   LUMBAR LAMINECTOMY  1994, 2010   POLYPECTOMY  01/25/2021   Procedure: POLYPECTOMY;  Surgeon: Hilarie Fredrickson, MD;  Location: [facility];  Service: Endoscopy;;   TIBIA FRACTURE SURGERY  2006   hardware   TONSILLECTOMY  1963    Social History   Socioeconomic History   Marital status: Married    Spouse name: Not on file   Number of children: 4   Years of education: Not on file   Highest education level: Not on file  Occupational History   Occupation: retired  Tobacco Use   Smoking status: Every Day    Current packs/day: 1.00    Average packs/day: 1 pack/day for 59.8 years (59.8 ttl pk-yrs)    Types: Cigarettes    Start date: 05/21/1963   Smokeless tobacco: Never   Tobacco comments:    not ready to quit yet  Vaping Use   Vaping status: Never Used  Substance and Sexual Activity   Alcohol use: No    Alcohol/week: 0.0 standard drinks of alcohol   Drug use: No    Comment: last dose November 2012   Sexual activity: Yes    Partners: Female    Comment: states no alcohol in 3 weeks  Other Topics  Concern   Not on file  Social History Narrative   Updated 06/12/15   Work or School: none, 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, environmental in the past      Home Situation: lives with wife and grandson      Spiritual Beliefs: Ephriam Knuckles, does not attend church      Lifestyle: no regular exercise, diet is not great but is interested in this      Social Determinants of Health   Financial Resource Strain: Low Risk  (11/06/2022)   Overall Financial Resource Strain (CARDIA)    Difficulty of Paying Living Expenses: Not very hard  Food Insecurity: No Food Insecurity (11/06/2022)   Hunger Vital Sign    Worried About Running Out of Food in the Last Year: Never  true    Ran Out of Food in the Last Year: Never true  Transportation Needs: No Transportation Needs (11/06/2022)   PRAPARE - Administrator, Civil Service (Medical): No    Lack of Transportation (Non-Medical): No  Physical Activity: Inactive (11/06/2022)   Exercise Vital Sign    Days of Exercise per Week: 0 days    Minutes of Exercise per Session: 0 min  Stress: No Stress Concern Present (11/06/2022)   [employer] of Occupational Health - Occupational Stress Questionnaire    Feeling of Stress : Not at all  Social Connections: Moderately Integrated (11/06/2022)   Social Connection and Isolation Panel [NHANES]    Frequency of Communication with Friends and Family: More than three times a week    Frequency of Social Gatherings with Friends and Family: More than three times a week    Attends Religious Services: 1 to 4 times per year    Active Member of [employer] or Organizations: No    Attends Banker Meetings: Never    Marital Status: Married  Catering manager Violence: Not At Risk (11/06/2022)   Humiliation, Afraid, Rape, and Kick questionnaire    Fear of Current or Ex-Partner: No    Emotionally Abused: No    Physically Abused: No    Sexually Abused: No    Family History  Problem Relation Age of Onset   Breast cancer Mother     Heart disease Father 79   Colon cancer Sister    Cancer Sister 69       sister, colon    Heart disease Other    Stomach cancer Neg Hx    Rectal cancer Neg Hx    Esophageal cancer Neg Hx    Pancreatic cancer Neg Hx     ROS: no fevers or chills, productive cough, hemoptysis, dysphasia, odynophagia, melena, hematochezia, dysuria, hematuria, rash, seizure activity, orthopnea, PND, pedal edema, claudication. Remaining systems are negative.  Physical Exam: Well-developed morbidly obese in no acute distress.  Skin is warm and dry.  HEENT is normal.  Neck is supple.  Chest is clear to auscultation with normal expansion.  Cardiovascular exam is irregular Abdominal exam nontender or distended. No masses palpated. Extremities show trace to 1+ edema. neuro grossly intact    A/P  1 persistent atrial fibrillation-patient has atrial fibrillation of unknown duration.  Will continue Toprol for rate control.  Continue apixaban.  I will arrange an elective cardioversion in 4 weeks to see if he will maintain sinus rhythm.  I think this will be less likely given his obesity and probable sleep apnea.  If not rate control and anticoagulation may be the best option.  2 history of cardiomyopathy-previously felt to be nonischemic as nuclear study showed no ischemia.  His LV function has improved on most recent echocardiogram.  Continue Entresto and beta-blocker.  3 coronary calcification-he denies chest pain.  Continue statin.  4 chronic combined systolic/diastolic congestive heart failure-patient appears to be euvolemic.  Continue present dose of diuretic.  Given morbid obesity/pannus I am hesitant to begin SGLT2 inhibitor.  5 tobacco abuse-we again discussed the importance of discontinuing.  6 lower extremity edema-patient is felt to likely have pulmonary hypertension related to a combination of pulmonary venous hypertension, obesity hypoventilation syndrome, obstructive sleep apnea and emphysema  causing RV dysfunction.  Will continue diuretic at present dose.  7 morbid obesity-we discussed the importance of weight loss.  I will also have him checked for sleep apnea and initiation of  therapy as needed.  Olga Millers, MD

## 2023-03-11 ENCOUNTER — Other Ambulatory Visit: Payer: Self-pay | Admitting: Student

## 2023-03-11 NOTE — Telephone Encounter (Signed)
Prescription refill request for Eliquis received. Indication: AF Last office visit: 01/27/23  D Whittenborn NP Scr: 1.04 on 11/28/22  Epic Age: 72 Weight: 170.1kg  Based on above findings Eliquis 5mg  twice daily is the appropriate dose.  Refill approved.

## 2023-03-12 ENCOUNTER — Encounter: Payer: Self-pay | Admitting: Student

## 2023-03-12 NOTE — Telephone Encounter (Signed)
Error

## 2023-03-19 ENCOUNTER — Other Ambulatory Visit: Payer: Self-pay

## 2023-03-19 ENCOUNTER — Telehealth: Payer: Self-pay | Admitting: *Deleted

## 2023-03-19 DIAGNOSIS — I5043 Acute on chronic combined systolic (congestive) and diastolic (congestive) heart failure: Secondary | ICD-10-CM

## 2023-03-19 MED ORDER — SPIRONOLACTONE 25 MG PO TABS
25.0000 mg | ORAL_TABLET | Freq: Every day | ORAL | 3 refills | Status: DC
Start: 2023-03-19 — End: 2024-04-05

## 2023-03-19 NOTE — Telephone Encounter (Signed)
CRITICAL VALUE STICKER  CRITICAL VALUE: PAD screening Left foot 0.77 - mild, right foot 0.85 mild  RECEIVER (on-site recipient of call): Fleet Contras -CMA  DATE & TIME NOTIFIED: 03/19/23  MESSENGER (representative from lab): Maxine Glenn NP  MD NOTIFIED: Ardyth Harps   TIME OF NOTIFICATION: 4:17 pm  RESPONSE:    Maxine Glenn, NP from [facility], called with the above results from a home visit 03/18/23. Her phone number is [phone].

## 2023-03-20 ENCOUNTER — Encounter: Payer: Self-pay | Admitting: Cardiology

## 2023-03-20 ENCOUNTER — Ambulatory Visit: Payer: Medicare Other | Attending: Cardiology | Admitting: Cardiology

## 2023-03-20 VITALS — BP 116/72 | HR 82 | Ht 70.0 in | Wt 374.4 lb

## 2023-03-20 DIAGNOSIS — Z720 Tobacco use: Secondary | ICD-10-CM | POA: Diagnosis not present

## 2023-03-20 DIAGNOSIS — I428 Other cardiomyopathies: Secondary | ICD-10-CM

## 2023-03-20 DIAGNOSIS — I4891 Unspecified atrial fibrillation: Secondary | ICD-10-CM

## 2023-03-20 DIAGNOSIS — G4733 Obstructive sleep apnea (adult) (pediatric): Secondary | ICD-10-CM

## 2023-03-20 DIAGNOSIS — J449 Chronic obstructive pulmonary disease, unspecified: Secondary | ICD-10-CM

## 2023-03-20 DIAGNOSIS — I10 Essential (primary) hypertension: Secondary | ICD-10-CM

## 2023-03-20 DIAGNOSIS — R0683 Snoring: Secondary | ICD-10-CM | POA: Diagnosis not present

## 2023-03-20 DIAGNOSIS — E782 Mixed hyperlipidemia: Secondary | ICD-10-CM

## 2023-03-20 DIAGNOSIS — I5042 Chronic combined systolic (congestive) and diastolic (congestive) heart failure: Secondary | ICD-10-CM | POA: Diagnosis not present

## 2023-03-20 NOTE — Patient Instructions (Addendum)
Medication Instructions:  Stop taking Aspirin. *If you need a refill on your cardiac medications before your next appointment, please call your pharmacy*   Testing/Procedures:     Dear Derrick Mata  You are scheduled for a Cardioversion on Thursday, December 5 with Dr. Izora Ribas.  Please arrive at the [facility] (Main Entrance A) at [facility]: [address] [place], [place] [address] at 7:00 AM (This time is 1 hour(s) before your procedure to ensure your preparation). Free valet parking service is available. You will check in at ADMITTING. The support person will be asked to wait in the waiting room.  It is OK to have someone drop you off and come back when you are ready to be discharged.      DIET:  Nothing to eat or drink after midnight except a sip of water with medications  DO NOT take furosemide the morning of the procedure.  MAKE SURE YOU TAKE YOUR ELIQUIS. DO NOT TAKE METOPROLOL MORNING OF THE PROCEDURE. LABS:   Come to [facility] office to have blood work completed by November 25th, 2024.  FYI:  For your safety, and to allow [place] to monitor your vital signs accurately during the surgery/procedure we request: If you have artificial nails, gel coating, SNS etc, please have those removed prior to your surgery/procedure. Not having the nail coverings /polish removed may result in cancellation or delay of your surgery/procedure.  You must have a responsible person to drive you home and stay in the waiting area during your procedure. Failure to do so could result in cancellation.  Bring your insurance cards.  *Special Note: Every effort is made to have your procedure done on time. Occasionally there are emergencies that occur at the hospital that may cause delays. Please be patient if a delay does occur.       Follow-Up: At [facility], you and your health needs are our priority.  As part of our continuing mission to provide you with exceptional heart  care, we have created designated Provider Care Teams.  These Care Teams include your primary Cardiologist (physician) and Advanced Practice Providers (APPs -  Physician Assistants and Nurse Practitioners) who all work together to provide you with the care you need, when you need it.  We recommend signing up for the patient portal called "MyChart".  Sign up information is provided on this After Visit Summary.  MyChart is used to connect with patients for Virtual Visits (Telemedicine).  Patients are able to view lab/test results, encounter notes, upcoming appointments, etc.  Non-urgent messages can be sent to your provider as well.   To learn more about what you can do with MyChart, go to [email or web address].    Your next appointment:   4 month(s)  Provider:   Any APP.

## 2023-04-08 ENCOUNTER — Ambulatory Visit: Payer: Medicare Other | Admitting: Orthopaedic Surgery

## 2023-04-11 ENCOUNTER — Telehealth: Payer: Self-pay | Admitting: Cardiology

## 2023-04-11 NOTE — Telephone Encounter (Signed)
Pt called in asking for instructions for his cardioversion coming up.

## 2023-04-11 NOTE — Telephone Encounter (Signed)
Patient identification verified by 2 forms. Marilynn Rail, RN    Called and spoke to patient  Patient states:   -received instructions for cardioversion  -received a pulmonology referral, and number to call  Advised patient to call number provider to schedule appointment  Patient has no further questions at this time

## 2023-04-20 ENCOUNTER — Other Ambulatory Visit: Payer: Self-pay | Admitting: Internal Medicine

## 2023-04-20 DIAGNOSIS — G4700 Insomnia, unspecified: Secondary | ICD-10-CM

## 2023-04-21 ENCOUNTER — Telehealth: Payer: Self-pay | Admitting: Cardiology

## 2023-04-21 NOTE — Telephone Encounter (Signed)
Spoke to patient's daughter she was calling to find out if a cardioversion was surgery.Procedure explained.She voiced understanding.Stated father was told to be a [facility] 12/5 at 7:00 am.

## 2023-04-21 NOTE — Telephone Encounter (Signed)
Patient's daughter is requesting call back to discuss cardioversion scheduled for 12/05.

## 2023-04-23 NOTE — Progress Notes (Signed)
Spoke to daughter [name] and gave her the preprocedure instructions for her fathers cv in the morning

## 2023-04-23 NOTE — Progress Notes (Signed)
Unable to reach patient about procedure, but was able to leave a detailed message. Stated that the patient needed to arrive at the hospital at 0845, remain NPO after 0000, needs to have a ride home and a responsible adult to stay with them for 24 hours after the procedure. Instructed the patient to call back if they had any questions.  Also attempted to call both of daughters listed phone numbers but received no answer.

## 2023-04-24 ENCOUNTER — Encounter (HOSPITAL_COMMUNITY)
Admission: RE | Disposition: A | Discharge status: Home / Self Care | Payer: Self-pay | Source: Home / Self Care | Attending: Internal Medicine

## 2023-04-24 ENCOUNTER — Other Ambulatory Visit: Payer: Self-pay

## 2023-04-24 ENCOUNTER — Telehealth: Payer: Self-pay

## 2023-04-24 ENCOUNTER — Ambulatory Visit (HOSPITAL_COMMUNITY): Payer: Medicare Other | Admitting: Certified Registered"

## 2023-04-24 ENCOUNTER — Encounter (HOSPITAL_COMMUNITY): Payer: Self-pay | Admitting: Internal Medicine

## 2023-04-24 ENCOUNTER — Ambulatory Visit (HOSPITAL_COMMUNITY)
Admission: RE | Admit: 2023-04-24 | Discharge: 2023-04-24 | Disposition: A | Discharge status: Home / Self Care | Payer: Medicare Other | Attending: Internal Medicine | Admitting: Internal Medicine

## 2023-04-24 DIAGNOSIS — I509 Heart failure, unspecified: Secondary | ICD-10-CM | POA: Diagnosis not present

## 2023-04-24 DIAGNOSIS — I4891 Unspecified atrial fibrillation: Secondary | ICD-10-CM | POA: Diagnosis not present

## 2023-04-24 DIAGNOSIS — I110 Hypertensive heart disease with heart failure: Secondary | ICD-10-CM | POA: Diagnosis not present

## 2023-04-24 DIAGNOSIS — Z79899 Other long term (current) drug therapy: Secondary | ICD-10-CM | POA: Insufficient documentation

## 2023-04-24 DIAGNOSIS — I4811 Longstanding persistent atrial fibrillation: Secondary | ICD-10-CM | POA: Diagnosis not present

## 2023-04-24 DIAGNOSIS — F32A Depression, unspecified: Secondary | ICD-10-CM | POA: Insufficient documentation

## 2023-04-24 DIAGNOSIS — G4733 Obstructive sleep apnea (adult) (pediatric): Secondary | ICD-10-CM | POA: Diagnosis not present

## 2023-04-24 DIAGNOSIS — F17210 Nicotine dependence, cigarettes, uncomplicated: Secondary | ICD-10-CM | POA: Insufficient documentation

## 2023-04-24 DIAGNOSIS — K219 Gastro-esophageal reflux disease without esophagitis: Secondary | ICD-10-CM | POA: Insufficient documentation

## 2023-04-24 DIAGNOSIS — Z6841 Body Mass Index (BMI) 40.0 and over, adult: Secondary | ICD-10-CM | POA: Insufficient documentation

## 2023-04-24 DIAGNOSIS — J449 Chronic obstructive pulmonary disease, unspecified: Secondary | ICD-10-CM | POA: Insufficient documentation

## 2023-04-24 HISTORY — PX: CARDIOVERSION: EP1203

## 2023-04-24 LAB — POCT I-STAT, CHEM 8
BUN: 17 mg/dL (ref 8–23)
BUN: 12 mg/dL (ref 8–23)
Calcium, Ion: 1.11 mmol/L — ABNORMAL LOW (ref 1.15–1.40)
Calcium, Ion: 1.23 mmol/L (ref 1.15–1.40)
Chloride: 103 mmol/L (ref 98–111)
Chloride: 102 mmol/L (ref 98–111)
Creatinine, Ser: 1.1 mg/dL (ref 0.61–1.24)
Creatinine, Ser: 1.2 mg/dL (ref 0.61–1.24)
Glucose, Bld: 94 mg/dL (ref 70–99)
Glucose, Bld: 91 mg/dL (ref 70–99)
HCT: 45 % (ref 39.0–52.0)
HCT: 48 % (ref 39.0–52.0)
Hemoglobin: 15.3 g/dL (ref 13.0–17.0)
Hemoglobin: 16.3 g/dL (ref 13.0–17.0)
Potassium: 6.8 mmol/L (ref 3.5–5.1)
Potassium: 4.5 mmol/L (ref 3.5–5.1)
Sodium: 139 mmol/L (ref 135–145)
Sodium: 142 mmol/L (ref 135–145)
TCO2: 30 mmol/L (ref 22–32)
TCO2: 27 mmol/L (ref 22–32)

## 2023-04-24 SURGERY — CARDIOVERSION (CATH LAB)
Anesthesia: General

## 2023-04-24 MED ORDER — LACTATED RINGERS IV SOLN: INTRAVENOUS | Status: DC | PRN

## 2023-04-24 MED ORDER — PROPOFOL 10 MG/ML IV BOLUS
INTRAVENOUS | Status: DC | PRN
  Administered 2023-04-24: 150 mg via INTRAVENOUS
  Administered 2023-04-24: 50 mg via INTRAVENOUS

## 2023-04-24 MED ORDER — LIDOCAINE 2% (20 MG/ML) 5 ML SYRINGE
INTRAMUSCULAR | Status: DC | PRN
  Administered 2023-04-24: 40 mg via INTRAVENOUS
  Administered 2023-04-24: 60 mg via INTRAVENOUS

## 2023-04-24 SURGICAL SUPPLY — 1 items: PAD DEFIB RADIO PHYSIO CONN (PAD) ×1 IMPLANT

## 2023-04-24 NOTE — H&P (Addendum)
Cardiology Procedural History and Physical  :   Patient ID: Derrick Mata; MRN: [id number]; DOB: [birth year]   Admission date: 04/24/2023  Primary Care Provider: Philip Aspen, Limmie Patricia, MD Primary Cardiologist: Dr. Jens Som  Chief Complaint:  AF- ablation  Patient Profile:   Derrick Mata is a 72 y.o. male with hx of HF and alcohol abuse in remission who now presents for AF   History of Present Illness:   Derrick Mata is feeling fine.    Has had no chest pain, chest pressure, chest tightness, chest stinging .   No shortness of breath, DOE .  No PND or orthopnea.  No weight gain, leg swelling , or abdominal swelling.  No syncope or near syncope . Notes  no palpitations or funny heart beats (he is in AF presently).  Notes that he does not use CPAP for his OSA- he has slept in an recliner for 16 years. He still smokes no alcohol.  No missed AC doses.  He had not remember some of the risk of DCCV and we reviewed the procedure again today (did not realized his DOAC was for stroke prevention).  He is asked we call his daughter [name], and his wife may be to worried after the procedure.   Allergies:    Allergies  Allergen Reactions   Penicillins Anaphylaxis, Nausea Only and Other (See Comments)    Passed out.   Sulfamethoxazole Hives    Social History:   Social History   Socioeconomic History   Marital status: Married    Spouse name: Not on file   Number of children: 4   Years of education: Not on file   Highest education level: Not on file  Occupational History   Occupation: retired  Tobacco Use   Smoking status: Every Day    Current packs/day: 1.00    Average packs/day: 1 pack/day for 59.9 years (59.9 ttl pk-yrs)    Types: Cigarettes    Start date: 05/21/1963   Smokeless tobacco: Never   Tobacco comments:    not ready to quit yet  Vaping Use   Vaping status: Never Used  Substance and Sexual Activity   Alcohol use: No    Alcohol/week: 0.0 standard drinks  of alcohol   Drug use: No    Comment: last dose November 2012   Sexual activity: Yes    Partners: Female    Comment: states no alcohol in 3 weeks  Other Topics Concern   Not on file  Social History Narrative   Updated 06/12/15   Work or School: none, 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, environmental in the past      Home Situation: lives with wife and grandson      Spiritual Beliefs: Ephriam Knuckles, does not attend church      Lifestyle: no regular exercise, diet is not great but is interested in this      Social Determinants of Health   Financial Resource Strain: Low Risk  (11/06/2022)   Overall Financial Resource Strain (CARDIA)    Difficulty of Paying Living Expenses: Not very hard  Food Insecurity: No Food Insecurity (11/06/2022)   Hunger Vital Sign    Worried About Running Out of Food in the Last Year: Never true    Ran Out of Food in the Last Year: Never true  Transportation Needs: No Transportation Needs (11/06/2022)   PRAPARE - Administrator, Civil Service (Medical): No    Lack of Transportation (Non-Medical): No  Physical Activity: Inactive (11/06/2022)   Exercise Vital Sign    Days of Exercise per Week: 0 days    Minutes of Exercise per Session: 0 min  Stress: No Stress Concern Present (11/06/2022)   [employer] of Occupational Health - Occupational Stress Questionnaire    Feeling of Stress : Not at all  Social Connections: Moderately Integrated (11/06/2022)   Social Connection and Isolation Panel [NHANES]    Frequency of Communication with Friends and Family: More than three times a week    Frequency of Social Gatherings with Friends and Family: More than three times a week    Attends Religious Services: 1 to 4 times per year    Active Member of [employer] or Organizations: No    Attends Banker Meetings: Never    Marital Status: Married  Catering manager Violence: Not At Risk (11/06/2022)   Humiliation, Afraid, Rape, and Kick questionnaire    Fear of Current or  Ex-Partner: No    Emotionally Abused: No    Physically Abused: No    Sexually Abused: No    Family History:   The patient's family history includes Breast cancer in his mother; Cancer (age of onset: 23) in his sister; Colon cancer in his sister; Heart disease in an other family member; Heart disease (age of onset: 9) in his father. There is no history of Stomach cancer, Rectal cancer, Esophageal cancer, or Pancreatic cancer.    ROS:  Please see the history of present illness.   Physical Exam/Data:   Vitals:   04/24/23 0840  BP: (!) 102/58  Pulse: 73  Resp: 16  Temp: (!) 97.3 F (36.3 C)  TempSrc: Temporal  SpO2: 97%  Weight: (!) 170.1 kg  Height: 5\' 11"  (1.803 m)   No intake or output data in the 24 hours ending 04/24/23 0917 Filed Weights   04/24/23 0840  Weight: (!) 170.1 kg   Body mass index is 52.3 kg/m.   Gen: no distress, Morbid Obesity   Neck: No JVD Cardiac: No Rubs or Gallops, no murmur, IRIR rhythm +2radial pulses Respiratory: Clear to auscultation bilaterally, normal effort, normal  respiratory rate GI: Soft, nontender, non-distended  MS: +1 bilateral  edema;  moves all extremities Integument: Skin feels warm Neuro:  At time of evaluation, alert and oriented to person/place/time/situation  Psych: Normal affect, patient feels warm   Relevant CV Studies:  Cardiac Studies & Procedures     STRESS TESTS  MYOCARDIAL PERFUSION IMAGING 05/28/2018  Narrative  Nuclear stress EF: 47%. The left ventricular ejection fraction is mildly decreased (45-54%). Visually, the EF appears to be greater than 47%.  There was no ST segment deviation noted during stress.  The study is normal.  This is a low risk study. There is no evidence of ischemia and no evidence of myocardial infarction.   ECHOCARDIOGRAM  ECHOCARDIOGRAM COMPLETE 03/03/2023  Narrative ECHOCARDIOGRAM REPORT    Patient Name:   Derrick Mata Date of Exam: 03/03/2023 Medical Rec #:  [id number]         Height:       69.0 in Accession #:    [id number]       Weight:       375.0 lb Date of Birth:  1951/04/11        BSA:          2.699 m Patient Age:    72 years         BP:  129/33 mmHg Patient Gender: M                HR:           99 bpm. Exam Location:  [address]  Procedure: 2D Echo, 3D Echo, Cardiac Doppler, Color Doppler and Strain Analysis  Indications:    I48.91 Atrial Fibrillation  History:        Patient has prior history of Echocardiogram examinations, most recent 04/08/2019. CHF, Signs/Symptoms:Edema; Risk Factors:Hypertension, HLD and Sleep Apnea.  Sonographer:    Clearence Ped RCS Referring Phys: [id number] Carlos Levering  IMPRESSIONS   1. Compared to echo report from 2020, LVEF is improved. 2. Left ventricular ejection fraction, by estimation, is 55 to 60%. The left ventricle has normal function. The left ventricle has no regional wall motion abnormalities. There is mild left ventricular hypertrophy. Left ventricular diastolic parameters are indeterminate. 3. Right ventricular systolic function is normal. The right ventricular size is normal. There is normal pulmonary artery systolic pressure. 4. The mitral valve is normal in structure. Trivial mitral valve regurgitation. 5. The aortic valve is tricuspid. Aortic valve regurgitation is mild.  FINDINGS Left Ventricle: Left ventricular ejection fraction, by estimation, is 55 to 60%. The left ventricle has normal function. The left ventricle has no regional wall motion abnormalities. Global longitudinal strain performed but not reported based on interpreter judgement due to suboptimal tracking. The left ventricular internal cavity size was normal in size. There is mild left ventricular hypertrophy. Left ventricular diastolic parameters are indeterminate.  Right Ventricle: The right ventricular size is normal. Right vetricular wall thickness was not assessed. Right ventricular systolic function is normal.  There is normal pulmonary artery systolic pressure. The tricuspid regurgitant velocity is 1.87 m/s, and with an assumed right atrial pressure of 3 mmHg, the estimated right ventricular systolic pressure is 17.0 mmHg.  Left Atrium: Left atrial size was normal in size.  Right Atrium: Right atrial size was normal in size.  Pericardium: There is no evidence of pericardial effusion.  Mitral Valve: The mitral valve is normal in structure. Trivial mitral valve regurgitation.  Tricuspid Valve: The tricuspid valve is normal in structure. Tricuspid valve regurgitation is trivial.  Aortic Valve: The aortic valve is tricuspid. Aortic valve regurgitation is mild. Aortic regurgitation PHT measures 311 msec.  Pulmonic Valve: The pulmonic valve was grossly normal. Pulmonic valve regurgitation is not visualized.  Aorta: The aortic root is normal in size and structure.  IAS/Shunts: No atrial level shunt detected by color flow Doppler.   LEFT VENTRICLE PLAX 2D LVIDd:         5.40 cm   Diastology LVIDs:         3.60 cm   LV e' medial:    11.60 cm/s LV PW:         1.00 cm   LV E/e' medial:  7.0 LV IVS:        1.20 cm   LV e' lateral:   13.30 cm/s LVOT diam:     2.10 cm   LV E/e' lateral: 6.1 LV SV:         44 LV SV Index:   16        2D Longitudinal Strain LVOT Area:     3.46 cm  2D Strain GLS (A2C):   -16.0 % 2D Strain GLS (A3C):   -15.4 % 2D Strain GLS (A4C):   -15.7 % 2D Strain GLS Avg:     -15.7 %  3D Volume EF: 3D EF:  54 % LV EDV:       131 ml LV ESV:       61 ml LV SV:        70 ml  RIGHT VENTRICLE RV Basal diam:  4.30 cm RV Mid diam:    3.30 cm RV S prime:     11.20 cm/s TAPSE (M-mode): 2.7 cm RVSP:           17.0 mmHg  LEFT ATRIUM             Index        RIGHT ATRIUM           Index LA diam:        4.70 cm 1.74 cm/m   RA Pressure: 3.00 mmHg LA Vol (A2C):   84.9 ml 31.46 ml/m  RA Area:     24.80 cm LA Vol (A4C):   82.9 ml 30.72 ml/m  RA Volume:   84.20 ml  31.20  ml/m LA Biplane Vol: 86.4 ml 32.02 ml/m AORTIC VALVE LVOT Vmax:   84.60 cm/s LVOT Vmean:  57.900 cm/s LVOT VTI:    0.126 m AI PHT:      311 msec  AORTA Ao Root diam: 3.70 cm Ao Asc diam:  4.50 cm  MITRAL VALVE               TRICUSPID VALVE MV Area (PHT):             TR Peak grad:   14.0 mmHg MV Decel Time:             TR Vmax:        187.00 cm/s MV E velocity: 81.75 cm/s  Estimated RAP:  3.00 mmHg RVSP:           17.0 mmHg  SHUNTS Systemic VTI:  0.13 m Systemic Diam: 2.10 cm  Dietrich Pates MD Electronically signed by Dietrich Pates MD Signature Date/Time: 03/03/2023/1:18:53 PM    Final    MONITORS  LONG TERM MONITOR (3-14 DAYS) 03/03/2023  Narrative Patch Wear Time:  10 days and 16 hours (2024-09-15T14:47:56-0400 to 2024-10-02T09:01:09-398)  Monitor 1 Atrial Fibrillation occurred continuously (100% burden), ranging from 44-175 bpm (avg of 78 bpm). Isolated VEs were rare (<1.0%, 1626), VE Couplets were rare (<1.0%, 22), and VE Triplets were rare (<1.0%, 1).  Monitor 2 Atrial Fibrillation occurred continuously (100% burden), ranging from 36-146 bpm (avg of 67 bpm). Isolated VEs were rare (<1.0%), VE Couplets were rare (<1.0%), and no VE Triplets were present. Ventricular Bigeminy was present.  Atrial fibrillation with PVCs or aberrantly conducted beats.  Rate controlled. Olga Millers            Assessment and Plan:    Long standing persistent AF on DOAC Super morbid obesity OSA not on CPAP HF recovered ED Tobacco abuse - we discussed that DCCV may not be successful.   Informed Consent   Shared Decision Making/Informed Consent The risks (stroke, cardiac arrhythmias rarely resulting in the need for a temporary or permanent pacemaker, skin irritation or burns and complications associated with conscious sedation including aspiration, arrhythmia, respiratory failure and death), benefits (restoration of normal sinus rhythm) and alternatives of a direct current  cardioversion were explained in detail to Derrick Mata and he agrees to proceed.          For questions or updates, please contact [facility] Please consult [email or web address] for contact info under Cardiology/STEMI.   Riley Lam, MD FASE [facility] Cardiologist   [facility]  [address] [place], [place] [address] [phone]  9:17 AM   Patient blood pressure has improved. Patient feels well.  Post procedure we have been unable to wean him off O2.  His resting Sat is 100%.  With ambulation or talking, this decreases to 84%.  He still smokes last cigarette prior to procedure.  He notes no symptoms.  He notes that he is a bit shaken up about the DCCV not working.  No SOB.  He notes that he is minimally active.  Notes that when [facility] said he needed to be admitted he was there for many days and saw 21 heart specialists.  I have offered OBS stay for IV diuretics.  He is not worried about his asymptomatic hypoxia.  He would like to go home.   His heart rate is back to normal rate.  No further bradycardia.  I will have him stop his metoprolol given heart rates in the 30s.  I recommend he take the 80 mg Daily of lasix that was originally prescribed.   Riley Lam, MD FASE [facility] Cardiologist [facility]  [address] [place], [address] [place], [place] [address] [phone]  1:21 PM

## 2023-04-24 NOTE — Anesthesia Postprocedure Evaluation (Signed)
Anesthesia Post Note  Patient: Derrick Mata  Procedure(s) Performed: CARDIOVERSION     Patient location during evaluation: Cath Lab Anesthesia Type: General Level of consciousness: awake Pain management: pain level controlled Vital Signs Assessment: post-procedure vital signs reviewed and stable Respiratory status: spontaneous breathing Cardiovascular status: stable Postop Assessment: no apparent nausea or vomiting Anesthetic complications: no  No notable events documented.  Last Vitals:  Vitals:   04/24/23 1036 04/24/23 1040  BP: (!) 153/122 (!) 117/94  Pulse: 69 68  Resp: 18 14  Temp:    SpO2: 94% 94%    Last Pain:  Vitals:   04/24/23 1035  TempSrc: Temporal  PainSc: 0-No pain                 Caren Macadam

## 2023-04-24 NOTE — Telephone Encounter (Signed)
-----   Message from Christell Constant sent at 04/24/2023  1:21 PM EST ----- Regarding: Hypoxic respiratory failure Hey team,  Please see notes from [name] Cardioversion.  He would do well with close [facility] team follow up, he declined further evaluation for hypoxic respiratory failure.  Thanks, MAC

## 2023-04-24 NOTE — Discharge Instructions (Signed)

## 2023-04-24 NOTE — Transfer of Care (Signed)
Immediate Anesthesia Transfer of Care Note  Patient: Derrick Mata  Procedure(s) Performed: CARDIOVERSION  Patient Location: PACU and Cath Lab  Anesthesia Type:General  Level of Consciousness: awake  Airway & Oxygen Therapy: Patient Spontanous Breathing  Post-op Assessment: Report given to RN and Post -op Vital signs reviewed and stable  Post vital signs: Reviewed and stable  Last Vitals:  Vitals Value Taken Time  BP    Temp    Pulse 68 04/24/23 1019  Resp 22 04/24/23 1019  SpO2 93 % 04/24/23 1019    Last Pain:  Vitals:   04/24/23 0840  TempSrc: Temporal         Complications: No notable events documented.

## 2023-04-24 NOTE — Anesthesia Preprocedure Evaluation (Signed)
Anesthesia Evaluation  Patient identified by MRN, date of birth, ID band Patient awake    Reviewed: Allergy & Precautions, NPO status , Patient's Chart, lab work & pertinent test results, reviewed documented beta blocker date and time   History of Anesthesia Complications (+) PROLONGED EMERGENCE and history of anesthetic complications  Airway Mallampati: II  TM Distance: >3 FB Neck ROM: Full    Dental  (+) Teeth Intact, Dental Advisory Given, Caps,    Pulmonary sleep apnea , COPD,  COPD inhaler, Current Smoker and Patient abstained from smoking.   breath sounds clear to auscultation       Cardiovascular hypertension, Pt. on medications and Pt. on home beta blockers +CHF  Normal cardiovascular exam+ dysrhythmias Atrial Fibrillation  Rhythm:Irregular Rate:Normal     Neuro/Psych  Headaches PSYCHIATRIC DISORDERS  Depression       GI/Hepatic ,GERD  Medicated and Controlled,,(+) Cirrhosis     substance abuse  alcohol use and cocaine use, Hepatitis -, CHx/o chronic HEP C Hx/o colon polyps Hx/o diverticulosis   Endo/Other    Class 4 obesity  Renal/GU   negative genitourinary   Musculoskeletal  (+) Arthritis , Osteoarthritis,    Abdominal  (+) + obese  Peds  Hematology   Anesthesia Other Findings   Reproductive/Obstetrics ED                             Anesthesia Physical Anesthesia Plan  ASA: 3  Anesthesia Plan: General   Post-op Pain Management:    Induction: Intravenous  PONV Risk Score and Plan: 1 and Propofol infusion  Airway Management Planned: Natural Airway and Simple Face Mask  Additional Equipment: None  Intra-op Plan:   Post-operative Plan:   Informed Consent: I have reviewed the patients History and Physical, chart, labs and discussed the procedure including the risks, benefits and alternatives for the proposed anesthesia with the patient or authorized representative  who has indicated his/her understanding and acceptance.       Plan Discussed with: CRNA  Anesthesia Plan Comments:         Anesthesia Quick Evaluation

## 2023-04-24 NOTE — Telephone Encounter (Signed)
Called patient to make a follow up appointment with Dr.Crenshaw on 05/15/23 at 1:30. Pt stated that he feels okay after his cardioversion.

## 2023-04-24 NOTE — CV Procedure (Addendum)
    Electrical Cardioversion Procedure Note Derrick Mata [id number] Oct 12, 1950  Procedure: Electrical Cardioversion Indications:  Atrial Fibrillation  Time Out: Verified patient identification, verified procedure,medications/allergies/relevent history reviewed, required imaging and test results available.  Performed  Procedure Details  The patient was NPO after midnight. Anesthesia was administered at the beside  by Dr. Arby Barrette  100 mg of lidocaine and 100 mg propofol.   LIFEPACK 20 e in the room would not sync consistently.  Anew LIFEPACK was brought into the room with no technical issues.  Cardioversion was done with synchronized biphasic defibrillation with AP pads with 200 Joules with no change in rhythm.  After a 300 J synchronized biphasic defibrillation, the patient was in rhythm for 3 sinus beats before transitioning back to atrial fibrillation.  IMPRESSION:  Briefly successful cardioversion of atrial fibrillation; patient finished procedure in atrial fibrillation .  Discussed with wife and daughter.   Riley Lam, MD FASE [facility] Cardiologist [facility]  [address] [place], [address] [place], [place] [address] [phone]  10:33 AM  Post procedure found to have hypotension and bradycardia.  LVEF 55%. Given IVF.  Briefly on non re-breather.  Given intervention by anesthesia with improvement.  Patient asymptomatic at time of evaluation but still on O2.  He is optimistic about his care.  We will follow with longer interval to make sure DC would be safe.  Riley Lam, MD FASE [facility] Cardiologist [facility]  [address] [place], [address] [place], [place] [address] [phone]  11:48 AM

## 2023-04-25 ENCOUNTER — Encounter (HOSPITAL_COMMUNITY): Payer: Self-pay | Admitting: Internal Medicine

## 2023-05-09 NOTE — Progress Notes (Unsigned)
HPI: Follow-up edema and cardiomyopathy. 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 Seen for edema October 2019.  Follow-up echo November 2019 showed ejection fraction 40 to 45% and mild diastolic dysfunction.  Biatrial enlargement.  Severe right ventricular enlargement. Nuclear study January 2020 showed ejection fraction 47% and no ischemia or infarction.  Abdominal ultrasound September 2020 showed no aneurysm.  Chest CT July 2024 showed aortic atherosclerosis, coronary calcification and enlarged pulmonary trunk; also with emphysema. Follow-up echocardiogram October 2024 showed normal LV function, mild left ventricular hypertrophy mild aortic insufficiency.  Recently found to be in atrial fibrillation.  Monitor October 2024 showed atrial fibrillation rate controlled.  Patient had cardioversion April 24, 2023.  Sinus was reestablished briefly but atrial fibrillation returned.  Since last seen   Current Outpatient Medications  Medication Sig Dispense Refill   albuterol (VENTOLIN HFA) 108 (90 Base) MCG/ACT inhaler INHALE 2 PUFFS INTO THE LUNGS EVERY 6 HOURS AS NEEDED FOR WHEEZING OR SHORTNESS OF BREATH (Patient not taking: Reported on 03/20/2023) 6.7 g 0   apixaban (ELIQUIS) 5 MG TABS tablet TAKE 1 TABLET(5 MG) BY MOUTH TWICE DAILY 60 tablet 5   buPROPion (WELLBUTRIN XL) 150 MG 24 hr tablet TAKE 1 TABLET(150 MG) BY MOUTH DAILY 90 tablet 1   diclofenac Sodium (VOLTAREN) 1 % GEL Apply 4 g topically 4 (four) times daily. (Patient not taking: Reported on 04/21/2023) 100 g 0   furosemide (LASIX) 40 MG tablet TAKE 2 TABLETS(80 MG) BY MOUTH DAILY IN THE MORNING (Patient taking differently: Take 40 mg by mouth daily.) 180 tablet 0   metoprolol succinate (TOPROL-XL) 25 MG 24 hr tablet TAKE 1 TABLET BY MOUTH DAILY 90 tablet 3   Multiple Vitamin (MULTIVITAMIN WITH MINERALS) TABS tablet Take 1 tablet by mouth daily.     Omega-3 Fatty Acids (FISH  OIL) 1000 MG CAPS Take 1,000 mg by mouth daily. 300 mg omega 3     omeprazole (PRILOSEC) 20 MG capsule TAKE 1 CAPSULE(20 MG) BY MOUTH DAILY 90 capsule 1   rosuvastatin (CRESTOR) 40 MG tablet Take 1 tablet (40 mg total) by mouth daily. 90 tablet 3   sacubitril-valsartan (ENTRESTO) 24-26 MG TAKE 1 TABLET BY MOUTH TWICE DAILY. 180 tablet 3   spironolactone (ALDACTONE) 25 MG tablet Take 1 tablet (25 mg total) by mouth daily. 90 tablet 3   traMADol (ULTRAM) 50 MG tablet Take 1 tablet (50 mg total) by mouth every 12 (twelve) hours as needed. 30 tablet 1   traZODone (DESYREL) 150 MG tablet TAKE 1 TABLET(150 MG) BY MOUTH AT BEDTIME 90 tablet 0   No current facility-administered medications for this visit.     Past Medical History:  Diagnosis Date   Arthritis    OA of knees   CHF (congestive heart failure) (HCC)    Closed compression fracture of L1 lumbar vertebra 09/03/2016   Colon polyp    Complication of anesthesia    slow to wake up   COPD (chronic obstructive pulmonary disease) (HCC)    > 30 pack yr smoker   Depression    Anxiety and Insomnia   Diverticulosis    Erectile dysfunction    GERD (gastroesophageal reflux disease)    Hemorrhoids    Hep C w/o coma, chronic (HCC) 06/12/2015   treated with medications   Leg edema    Morbid obesity (HCC)    Sleep apnea    refuses CPAP   Substance abuse (HCC)  hx alcohol and cocaine use, 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, 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    Past Surgical History:  Procedure Laterality Date   CARDIOVERSION N/A 04/24/2023   Procedure: CARDIOVERSION;  Surgeon: Christell Constant, MD;  Location: [facility];  Service: Cardiovascular;  Laterality: N/A;   COLONOSCOPY  04/18/2011   Procedure: COLONOSCOPY;  Surgeon: Yancey Flemings, MD;  Location: [facility];  Service: Endoscopy;  Laterality: N/A;   COLONOSCOPY WITH PROPOFOL N/A 05/07/2016   Procedure: COLONOSCOPY WITH PROPOFOL;  Surgeon: Hilarie Fredrickson, MD;  Location: [facility];  Service: Endoscopy;  Laterality: N/A;   COLONOSCOPY WITH PROPOFOL N/A 08/12/2017   Procedure: COLONOSCOPY WITH PROPOFOL;  Surgeon: Hilarie Fredrickson, MD;  Location: [facility];  Service: Endoscopy;  Laterality: N/A;   COLONOSCOPY WITH PROPOFOL N/A 01/25/2021   Procedure: COLONOSCOPY WITH PROPOFOL;  Surgeon: Hilarie Fredrickson, MD;  Location: [facility];  Service: Endoscopy;  Laterality: N/A;   ESOPHAGOGASTRODUODENOSCOPY (EGD) WITH PROPOFOL N/A 05/07/2016   Procedure: ESOPHAGOGASTRODUODENOSCOPY (EGD) WITH PROPOFOL;  Surgeon: Hilarie Fredrickson, MD;  Location: [facility];  Service: Endoscopy;  Laterality: N/A;   ESOPHAGOGASTRODUODENOSCOPY (EGD) WITH PROPOFOL N/A 01/25/2021   Procedure: ESOPHAGOGASTRODUODENOSCOPY (EGD) WITH PROPOFOL;  Surgeon: Hilarie Fredrickson, MD;  Location: [facility];  Service: Endoscopy;  Laterality: N/A;   KNEE ARTHROSCOPY  2011   left   LUMBAR LAMINECTOMY  1994, 2010   POLYPECTOMY  01/25/2021   Procedure: POLYPECTOMY;  Surgeon: Hilarie Fredrickson, MD;  Location: [facility];  Service: Endoscopy;;   TIBIA FRACTURE SURGERY  2006   hardware   TONSILLECTOMY  1963    Social History   Socioeconomic History   Marital status: Married    Spouse name: Not on file   Number of children: 4   Years of education: Not on file   Highest education level: Not on file  Occupational History   Occupation: retired  Tobacco Use   Smoking status: Every Day    Current packs/day: 1.00    Average packs/day: 1 pack/day for 60.0 years (60.0 ttl pk-yrs)    Types: Cigarettes    Start date: 05/21/1963   Smokeless tobacco: Never   Tobacco comments:    not ready to quit yet  Vaping Use   Vaping status: Never Used  Substance and Sexual Activity   Alcohol use: No    Alcohol/week: 0.0 standard drinks of alcohol   Drug use: No    Comment: last dose November 2012   Sexual activity: Yes    Partners: Female    Comment: states no alcohol in 3 weeks  Other Topics Concern   Not on file  Social History  Narrative   Updated 06/12/15   Work or School: none, 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, environmental in the past      Home Situation: lives with wife and grandson      Spiritual Beliefs: Ephriam Knuckles, does not attend church      Lifestyle: no regular exercise, diet is not great but is interested in this      Social Drivers of Health   Financial Resource Strain: Low Risk  (11/06/2022)   Overall Financial Resource Strain (CARDIA)    Difficulty of Paying Living Expenses: Not very hard  Food Insecurity: No Food Insecurity (11/06/2022)   Hunger Vital Sign    Worried About Running Out of Food in the Last Year: Never true    Ran Out of Food in the Last Year: Never true  Transportation Needs:  No Transportation Needs (11/06/2022)   PRAPARE - Administrator, Civil Service (Medical): No    Lack of Transportation (Non-Medical): No  Physical Activity: Inactive (11/06/2022)   Exercise Vital Sign    Days of Exercise per Week: 0 days    Minutes of Exercise per Session: 0 min  Stress: No Stress Concern Present (11/06/2022)   [employer] of Occupational Health - Occupational Stress Questionnaire    Feeling of Stress : Not at all  Social Connections: Moderately Integrated (11/06/2022)   Social Connection and Isolation Panel [NHANES]    Frequency of Communication with Friends and Family: More than three times a week    Frequency of Social Gatherings with Friends and Family: More than three times a week    Attends Religious Services: 1 to 4 times per year    Active Member of [employer] or Organizations: No    Attends Banker Meetings: Never    Marital Status: Married  Catering manager Violence: Not At Risk (11/06/2022)   Humiliation, Afraid, Rape, and Kick questionnaire    Fear of Current or Ex-Partner: No    Emotionally Abused: No    Physically Abused: No    Sexually Abused: No    Family History  Problem Relation Age of Onset   Breast cancer Mother    Heart disease Father 44   Colon cancer Sister     Cancer Sister 5       sister, colon    Heart disease Other    Stomach cancer Neg Hx    Rectal cancer Neg Hx    Esophageal cancer Neg Hx    Pancreatic cancer Neg Hx     ROS: no fevers or chills, productive cough, hemoptysis, dysphasia, odynophagia, melena, hematochezia, dysuria, hematuria, rash, seizure activity, orthopnea, PND, pedal edema, claudication. Remaining systems are negative.  Physical Exam: Well-developed well-nourished in no acute distress.  Skin is warm and dry.  HEENT is normal.  Neck is supple.  Chest is clear to auscultation with normal expansion.  Cardiovascular exam is regular rate and rhythm.  Abdominal exam nontender or distended. No masses palpated. Extremities show no edema. neuro grossly intact  ECG- personally reviewed  A/P  1 persistent atrial fibrillation-patient underwent cardioversion December 5.  Sinus was reestablished briefly but atrial fibrillation recurred.  He is essentially asymptomatic.  I therefore think rate control and anticoagulation would be appropriate.  Will continue Toprol and apixaban.  2 history of cardiomyopathy-felt to be nonischemic in the past as previous nuclear study showed no ischemia.  LV function improved on most recent echocardiogram.  Continue Entresto and Toprol.  3 chronic combined systolic/diastolic congestive heart failure-he is euvolemic.  Continue diuretic at present dose.  We avoided SGLT2 inhibitor previously due to his obesity/pannus.  4 coronary calcification-continue statin.  5 morbid obesity-we again discussed the importance of diet, exercise and weight loss.  6 tobacco abuse-we discussed importance of avoiding.  7 lower extremity edema-patient is felt likely to have pulmonary hypertension related to a combination of pulmonary venous hypertension, obesity hypoventilation syndrome, obstructive sleep apnea and emphysema.  Will continue diuretic.  Olga Millers, MD

## 2023-05-12 ENCOUNTER — Telehealth: Payer: Self-pay | Admitting: Cardiology

## 2023-05-12 NOTE — Telephone Encounter (Signed)
Patient is requesting to speak with Bettina Gavia or nurse

## 2023-05-12 NOTE — Telephone Encounter (Signed)
Spoke to patient he stated he would like to apply for Eliquis patient assistance.He has appointment with Dr.Crenshaw Thurs 12/26.He wants to fill out patient assistance form then.He will bring proof of income.Advised I will make Dr.Crenshaw's RN aware.

## 2023-05-15 ENCOUNTER — Encounter: Payer: Self-pay | Admitting: Cardiology

## 2023-05-15 ENCOUNTER — Ambulatory Visit: Payer: Medicare Other | Attending: Cardiology | Admitting: Cardiology

## 2023-05-15 VITALS — BP 106/73 | Ht 71.0 in | Wt 375.0 lb

## 2023-05-15 DIAGNOSIS — I10 Essential (primary) hypertension: Secondary | ICD-10-CM

## 2023-05-15 DIAGNOSIS — I428 Other cardiomyopathies: Secondary | ICD-10-CM | POA: Diagnosis not present

## 2023-05-15 DIAGNOSIS — I4891 Unspecified atrial fibrillation: Secondary | ICD-10-CM | POA: Diagnosis not present

## 2023-05-15 DIAGNOSIS — E785 Hyperlipidemia, unspecified: Secondary | ICD-10-CM | POA: Diagnosis not present

## 2023-05-15 DIAGNOSIS — Z720 Tobacco use: Secondary | ICD-10-CM | POA: Diagnosis not present

## 2023-05-15 NOTE — Patient Instructions (Signed)
    Follow-Up: At [facility], you and your health needs are our priority.  As part of our continuing mission to provide you with exceptional heart care, we have created designated Provider Care Teams.  These Care Teams include your primary Cardiologist (physician) and Advanced Practice Providers (APPs -  Physician Assistants and Nurse Practitioners) who all work together to provide you with the care you need, when you need it.  We recommend signing up for the patient portal called "MyChart".  Sign up information is provided on this After Visit Summary.  MyChart is used to connect with patients for Virtual Visits (Telemedicine).  Patients are able to view lab/test results, encounter notes, upcoming appointments, etc.  Non-urgent messages can be sent to your provider as well.   To learn more about what you can do with MyChart, go to [email or web address].    Your next appointment:   2 month(s)  Provider:   Marjie Skiff, PA-C, Robet Leu, PA-C, Joni Reining, DNP, ANP, Azalee Course, PA-C, Bernadene Person, NP, or Reather Littler, NP    Then, Olga Millers, MD will plan to see you again in 6 month(s).

## 2023-05-16 ENCOUNTER — Encounter: Payer: Self-pay | Admitting: *Deleted

## 2023-05-16 LAB — BASIC METABOLIC PANEL
BUN/Creatinine Ratio: 12 (ref 10–24)
BUN: 14 mg/dL (ref 8–27)
CO2: 23 mmol/L (ref 20–29)
Calcium: 8.8 mg/dL (ref 8.6–10.2)
Chloride: 105 mmol/L (ref 96–106)
Creatinine, Ser: 1.13 mg/dL (ref 0.76–1.27)
Glucose: 81 mg/dL (ref 70–99)
Potassium: 4.1 mmol/L (ref 3.5–5.2)
Sodium: 144 mmol/L (ref 134–144)
eGFR: 68 mL/min/{1.73_m2} (ref >59)

## 2023-05-16 NOTE — Telephone Encounter (Signed)
Application for patient assistance for eliquis given to the patient at office visit.

## 2023-05-26 ENCOUNTER — Telehealth: Payer: Self-pay | Admitting: Cardiology

## 2023-05-26 NOTE — Telephone Encounter (Signed)
 Spoke with pt, Aware of dr Ludwig Clarks recommendations.

## 2023-05-26 NOTE — Telephone Encounter (Signed)
 Patient identification verified by 2 forms. Bertina Cooks, RN    Called and spoke to patient  Patient states:   -has question about A-fib and medications   -spoke to a friend, was informed he does not have enough vitamin B-complex, could be related to A-fib  -is considering taking additional supplements for B-complex   -would like to know if okay to take additional supplements  Informed patient message sent to provider/pharmacy input & advisement  Patient verbalized understanding, no questions at this time

## 2023-05-26 NOTE — Telephone Encounter (Signed)
 Patient states he has questions regarding his afib diagnosis and possible medication restrictions. He would like a call back to discuss further.

## 2023-05-29 DIAGNOSIS — H33312 Horseshoe tear of retina without detachment, left eye: Secondary | ICD-10-CM | POA: Diagnosis not present

## 2023-05-29 DIAGNOSIS — H25813 Combined forms of age-related cataract, bilateral: Secondary | ICD-10-CM | POA: Diagnosis not present

## 2023-05-29 DIAGNOSIS — H524 Presbyopia: Secondary | ICD-10-CM | POA: Diagnosis not present

## 2023-05-29 DIAGNOSIS — H2513 Age-related nuclear cataract, bilateral: Secondary | ICD-10-CM | POA: Diagnosis not present

## 2023-05-29 DIAGNOSIS — H35341 Macular cyst, hole, or pseudohole, right eye: Secondary | ICD-10-CM | POA: Diagnosis not present

## 2023-05-29 DIAGNOSIS — H35033 Hypertensive retinopathy, bilateral: Secondary | ICD-10-CM | POA: Diagnosis not present

## 2023-05-29 DIAGNOSIS — H538 Other visual disturbances: Secondary | ICD-10-CM | POA: Diagnosis not present

## 2023-05-29 DIAGNOSIS — H43392 Other vitreous opacities, left eye: Secondary | ICD-10-CM | POA: Diagnosis not present

## 2023-06-06 ENCOUNTER — Encounter: Payer: Self-pay | Admitting: Primary Care

## 2023-06-06 ENCOUNTER — Ambulatory Visit: Payer: Medicare Other | Admitting: Primary Care

## 2023-06-06 ENCOUNTER — Telehealth: Payer: Self-pay | Admitting: Cardiology

## 2023-06-06 VITALS — BP 95/62 | HR 91 | Temp 97.5°F | Ht 70.5 in | Wt 377.2 lb

## 2023-06-06 DIAGNOSIS — Z8669 Personal history of other diseases of the nervous system and sense organs: Secondary | ICD-10-CM | POA: Diagnosis not present

## 2023-06-06 DIAGNOSIS — Z23 Encounter for immunization: Secondary | ICD-10-CM | POA: Diagnosis not present

## 2023-06-06 DIAGNOSIS — G4733 Obstructive sleep apnea (adult) (pediatric): Secondary | ICD-10-CM | POA: Diagnosis not present

## 2023-06-06 DIAGNOSIS — I4819 Other persistent atrial fibrillation: Secondary | ICD-10-CM | POA: Diagnosis not present

## 2023-06-06 MED ORDER — METOPROLOL SUCCINATE ER 25 MG PO TB24: 25.0000 mg | ORAL_TABLET | Freq: Every day | ORAL | 3 refills | Status: DC

## 2023-06-06 NOTE — Telephone Encounter (Signed)
Left detailed message for patient to restart metoprolol. New script sent to the pharmacy. No need for aspirin.

## 2023-06-06 NOTE — Telephone Encounter (Signed)
Pt c/o medication issue:  1. Name of Medication:   metoprolol succinate (TOPROL-XL) 25 MG 24 hr tablet   2. How are you currently taking this medication (dosage and times per day)?   Currently not taking  3. Are you having a reaction (difficulty breathing--STAT)?   4. What is your medication issue?   Patient stated he had a visit with his pulmonologist and needs to get clarification regarding if he still needs to restart this medication and/or his aspirin.

## 2023-06-06 NOTE — Patient Instructions (Addendum)
-  ATRIAL FIBRILLATION: Atrial fibrillation is an irregular and often rapid heart rate that can increase the risk of strokes, heart failure, and other heart-related complications. You need to clarify your medication regimen with Dr. Jens Som, especially regarding Toprol (vs metoprolol). Continue taking Eliquis for anticoagulation.  -OBSTRUCTIVE SLEEP APNEA: Obstructive sleep apnea is a condition where the airway becomes blocked during sleep, causing breathing to stop and start. We will order a home sleep study to reassess the severity of your sleep apnea. Depending on the results, we will discuss treatment options including weight loss, an oral appliance, or Inspire therapy if weight loss is achieved.  Mild OSA 5-15 apneic events an hour Moderate OSA 15-30 apneic events an hour Severe OSA > 30 apneic events an hour   Untreated sleep apnea puts you at higher risk for cardiac arrhythmias, pulmonary HTN, stroke and diabetes  Treatment options include weight loss, side sleeping position, oral appliance, CPAP therapy or referral to ENT for possible surgical options   -OBESITY: Obesity is a condition characterized by excessive body fat. With a BMI of 53, you may be a candidate for weight loss medication, Zepbound if sleep apnea is confirmed. Discuss this potential treatment with your primary care provider.  Recommendations: Continue to to sleep in recliner Work on weight loss efforts if able  Do not drive if experiencing excessive daytime sleepiness of fatigue    Orders: Home sleep study re: loud snoring    Follow-up: Please call to schedule follow-up 1-2 weeks after completing home sleep study to review results and treatment if needed (can be virtual)  Please follow up with Dr. Jens Som to clarify your medication regimen, specifically regarding metoprolol. Additionally, complete the home sleep study as ordered and discuss the potential for weight loss medication with your primary care  provider.

## 2023-06-06 NOTE — Progress Notes (Signed)
@Patient  ID: Derrick Mata, male    DOB: 1950/06/01, 73 y.o.   MRN: [id number]  Chief Complaint  Patient presents with   Follow-up    Referring provider: Lewayne Bunting, MD  HPI: 73 year old male, former smoker.  Past medical history significant for congestive heart failure, aortic arthrosclerosis, hypertension, portal hypertension, OSA, COPD, osteoarthritis, hepatic cirrhosis.  06/06/2023 Discussed the use of AI scribe software for clinical note transcription with the patient, who gave verbal consent to proceed.  History of Present Illness   The patient, with a known history of atrial fibrillation (AFib), was referred for a sleep consult by his cardiologist due to concerns of potential sleep apnea. The patient's AFib was first diagnosed in 2000, with the most recent episode occurring in December, for which he underwent an unsuccessful cardioversion. The patient was previously on metoprolol and aspirin, but reports that these medications were discontinued, and he is currently on Eliquis and Entresto. The patient also takes Lasix for fluid management.  The patient has a history of sleep apnea and used to use a CPAP machine, but discontinued its use around 2000 due to inconvenience. He reports sleeping in a recliner every night since then, and his spouse reports no snoring. The patient has lost weight in the past year, but the exact amount is not specified.    Typical bedtime is between 1130 and 12 AM.  Takes him 40 to 45 minutes to fall asleep.  He wakes up on average 2 times a night.  He starts his day between 5 and 5:15 AM.  He has lost 60 pounds.  He does not operate heavy machinery.  Last sleep study was done at [facility].  He is not currently wearing CPAP or oxygen.  Epworth score 2.    The patient's medication regimen appears to be a point of confusion, as he reports being taken off metoprolol and aspirin, but the cardiologist's note suggests he should still be on Toprol XL. The  patient plans to clarify this with his cardiologist.   Allergies  Allergen Reactions   Penicillins Anaphylaxis, Nausea Only and Other (See Comments)    Passed out.   Sulfamethoxazole Hives    Immunization History  Administered Date(s) Administered   Hepatitis A, Adult 12/19/2015, 10/03/2016   Hepatitis B, ADULT 08/21/2015, 04/04/2016   Hepatitis B, PED/ADOLESCENT 07/19/2015   Influenza Split 03/28/2011   Influenza Whole 06/23/2007, 02/15/2009, 01/31/2010   Influenza, High Dose Seasonal PF 02/01/2016, 03/10/2017, 02/19/2018, 02/20/2019   Influenza,inj,Quad PF,6+ Mos 01/12/2013, 02/25/2014   Influenza-Unspecified 01/19/2015, 02/17/2017, 05/03/2020, 03/06/2021   PFIZER Comirnaty(Gray Top)Covid-19 Tri-Sucrose Vaccine 06/26/2019, 07/17/2019, 03/03/2020, 03/06/2021   Pneumococcal Conjugate-13 09/08/2014   Pneumococcal Polysaccharide-23 07/17/2015   Tdap 08/29/2021   Zoster Recombinant(Shingrix) 04/02/2017, 07/29/2017   Zoster, Live 02/08/2012    Past Medical History:  Diagnosis Date   Arthritis    OA of knees   CHF (congestive heart failure) (HCC)    Closed compression fracture of L1 lumbar vertebra 09/03/2016   Colon polyp    Complication of anesthesia    slow to wake up   COPD (chronic obstructive pulmonary disease) (HCC)    > 30 pack yr smoker   Depression    Anxiety and Insomnia   Diverticulosis    Erectile dysfunction    GERD (gastroesophageal reflux disease)    Hemorrhoids    Hep C w/o coma, chronic (HCC) 06/12/2015   treated with medications   Leg edema    Morbid obesity (HCC)  Sleep apnea    refuses CPAP   Substance abuse (HCC)    hx alcohol and cocaine use, 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, 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    Tobacco History: Social History   Tobacco Use  Smoking Status Former   Current packs/day: 1.00   Average packs/day: 1 pack/day for 60.0 years (60.0 ttl pk-yrs)   Types: Cigarettes   Start date: 05/21/1963   Passive exposure: Past   Smokeless Tobacco Never  Tobacco Comments   Pt states he quit smoking 2 months ago   Counseling given: Not Answered Tobacco comments: Pt states he quit smoking 2 months ago   Outpatient Medications Prior to Visit  Medication Sig Dispense Refill   albuterol (VENTOLIN HFA) 108 (90 Base) MCG/ACT inhaler INHALE 2 PUFFS INTO THE LUNGS EVERY 6 HOURS AS NEEDED FOR WHEEZING OR SHORTNESS OF BREATH 6.7 g 0   apixaban (ELIQUIS) 5 MG TABS tablet TAKE 1 TABLET(5 MG) BY MOUTH TWICE DAILY 60 tablet 5   buPROPion (WELLBUTRIN XL) 150 MG 24 hr tablet TAKE 1 TABLET(150 MG) BY MOUTH DAILY 90 tablet 1   diclofenac Sodium (VOLTAREN) 1 % GEL Apply 4 g topically 4 (four) times daily. 100 g 0   furosemide (LASIX) 40 MG tablet TAKE 2 TABLETS(80 MG) BY MOUTH DAILY IN THE MORNING (Patient taking differently: Take 40 mg by mouth daily.) 180 tablet 0   Multiple Vitamin (MULTIVITAMIN WITH MINERALS) TABS tablet Take 1 tablet by mouth daily.     Omega-3 Fatty Acids (FISH OIL) 1000 MG CAPS Take 1,000 mg by mouth daily. 300 mg omega 3     omeprazole (PRILOSEC) 20 MG capsule TAKE 1 CAPSULE(20 MG) BY MOUTH DAILY 90 capsule 1   rosuvastatin (CRESTOR) 40 MG tablet Take 1 tablet (40 mg total) by mouth daily. 90 tablet 3   sacubitril-valsartan (ENTRESTO) 24-26 MG TAKE 1 TABLET BY MOUTH TWICE DAILY. 180 tablet 3   spironolactone (ALDACTONE) 25 MG tablet Take 1 tablet (25 mg total) by mouth daily. 90 tablet 3   traMADol (ULTRAM) 50 MG tablet Take 1 tablet (50 mg total) by mouth every 12 (twelve) hours as needed. 30 tablet 1   traZODone (DESYREL) 150 MG tablet TAKE 1 TABLET(150 MG) BY MOUTH AT BEDTIME 90 tablet 0   metoprolol succinate (TOPROL-XL) 25 MG 24 hr tablet TAKE 1 TABLET BY MOUTH DAILY (Patient not taking: Reported on 05/15/2023) 90 tablet 3   No facility-administered medications prior to visit.   Review of Systems  Review of Systems  Constitutional: Negative.   Respiratory: Negative.    Psychiatric/Behavioral:   Positive for sleep disturbance.      Physical Exam  BP 95/62 (BP Location: Right Arm, Patient Position: Sitting, Cuff Size: Large)   Pulse 91   Temp (!) 97.5 F (36.4 C) (Temporal)   Ht 5' 10.5" (1.791 m)   Wt (!) 377 lb 3.2 oz (171.1 kg)   SpO2 92%   BMI 53.36 kg/m  Physical Exam Constitutional:      Appearance: Normal appearance.  HENT:     Head: Normocephalic and atraumatic.     Mouth/Throat:     Mouth: Mucous membranes are moist.     Pharynx: Oropharynx is clear.  Cardiovascular:     Rate and Rhythm: Normal rate. Rhythm irregular.  Pulmonary:     Effort: Pulmonary effort is normal.     Breath sounds: Normal breath sounds.  Neurological:     General: No focal deficit present.  Mental Status: He is alert and oriented to person, place, and time. Mental status is at baseline.  Psychiatric:        Mood and Affect: Mood normal.        Behavior: Behavior normal.        Thought Content: Thought content normal.        Judgment: Judgment normal.      Lab Results:  CBC    Component Value Date/Time   WBC 8.6 11/28/2022 1423   RBC 5.55 11/28/2022 1423   HGB 16.3 04/24/2023 0952   HCT 48.0 04/24/2023 0952   PLT 243 11/28/2022 1423   MCV 83.6 11/28/2022 1423   MCH 27.4 11/28/2022 1423   MCHC 32.8 11/28/2022 1423   RDW 15.4 11/28/2022 1423   LYMPHSABS 1.5 11/07/2022 0926   MONOABS 0.5 11/07/2022 0926   EOSABS 0.1 11/07/2022 0926   BASOSABS 0.0 11/07/2022 0926    BMET    Component Value Date/Time   NA 144 05/15/2023 1425   K 4.1 05/15/2023 1425   CL 105 05/15/2023 1425   CO2 23 05/15/2023 1425   GLUCOSE 81 05/15/2023 1425   GLUCOSE 91 04/24/2023 0952   BUN 14 05/15/2023 1425   CREATININE 1.13 05/15/2023 1425   CREATININE 0.94 10/03/2016 0959   CALCIUM 8.8 05/15/2023 1425   GFRNONAA >60 11/28/2022 1423   GFRNONAA 84 10/03/2016 0959   GFRAA 90 07/04/2020 1159   GFRAA >89 10/03/2016 0959    BNP No results found for: "BNP"  ProBNP    Component  Value Date/Time   PROBNP 32.9 08/30/2012 2358    Imaging: No results found.   Assessment & Plan:   1. OSA (obstructive sleep apnea) (Primary)  2. Hx of sleep apnea - Home sleep test; Future  3. Immunization due - Flu Vaccine Trivalent High Dose (Fluad)  4. Persistent atrial fibrillation (HCC)     Atrial Fibrillation - Persistent despite recent unsuccessful cardioversion. Currently asymptomatic. Per Dr. Sheilah Mins note December patient was to continue Toprol XL, however, patient tells me metoprolol was discontinued and he is not taking Toprol XL.  Advised patient clarify this with his cardiologist  -Continue Entresto and Eliquis for anticoagulation.  Obstructive Sleep Apnea -History of OSA with non-compliance to CPAP therapy in the past. Patient sleeps in recliner and reports no snoring along with recent weight loss.  We discussed risks of untreated sleep apnea and treatment options.  Patient is unlikely to be open to resuming CPAP if needed. -Order home sleep study (in recliner) to reassess severity of sleep apnea. -Discuss potential treatment options including weight loss, oral appliance, and Inspire therapy (if weight loss achieved).  Obesity BMI 53. Potential candidate for weight loss medication, Zepbound, if sleep apnea is confirmed. -Discuss potential for weight loss medication with primary care provider.     Glenford Bayley, NP 06/06/2023

## 2023-06-06 NOTE — Progress Notes (Signed)
@Patient  ID: Derrick Mata, male    DOB: [birth year], 73 y.o.   MRN: [id number]  Chief Complaint  Patient presents with  . Follow-up    Referring provider: Lewayne Bunting, MD  HPI:   Allergies  Allergen Reactions  . Penicillins Anaphylaxis, Nausea Only and Other (See Comments)    Passed out.  . Sulfamethoxazole Hives    Immunization History  Administered Date(s) Administered  . Hepatitis A, Adult 12/19/2015, 10/03/2016  . Hepatitis B, ADULT 08/21/2015, 04/04/2016  . Hepatitis B, PED/ADOLESCENT 07/19/2015  . Influenza Split 03/28/2011  . Influenza Whole 06/23/2007, 02/15/2009, 01/31/2010  . Influenza, High Dose Seasonal PF 02/01/2016, 03/10/2017, 02/19/2018, 02/20/2019  . Influenza,inj,Quad PF,6+ Mos 01/12/2013, 02/25/2014  . Influenza-Unspecified 01/19/2015, 02/17/2017, 05/03/2020, 03/06/2021  . PFIZER Comirnaty(Gray Top)Covid-19 Tri-Sucrose Vaccine 06/26/2019, 07/17/2019, 03/03/2020, 03/06/2021  . Pneumococcal Conjugate-13 09/08/2014  . Pneumococcal Polysaccharide-23 07/17/2015  . Tdap 08/29/2021  . Zoster Recombinant(Shingrix) 04/02/2017, 07/29/2017  . Zoster, Live 02/08/2012    Past Medical History:  Diagnosis Date  . Arthritis    OA of knees  . CHF (congestive heart failure) (HCC)   . Closed compression fracture of L1 lumbar vertebra 09/03/2016  . Colon polyp   . Complication of anesthesia    slow to wake up  . COPD (chronic obstructive pulmonary disease) (HCC)    > 30 pack yr smoker  . Depression    Anxiety and Insomnia  . Diverticulosis   . Erectile dysfunction   . GERD (gastroesophageal reflux disease)   . Hemorrhoids   . Hep C w/o coma, chronic (HCC) 06/12/2015   treated with medications  . Leg edema   . Morbid obesity (HCC)   . Sleep apnea    refuses CPAP  . 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 Current packs/day: 1.00  . Average packs/day: 1 pack/day for 60.0 years (60.0 ttl pk-yrs)  . Types: Cigarettes  . Start date: 05/21/1963  . Passive exposure: Past  Smokeless Tobacco Never  Tobacco Comments   Pt states he quit smoking 2 months ago   Counseling given: Not Answered Tobacco comments: Pt states he quit smoking 2 months ago   Outpatient Medications Prior to Visit  Medication Sig Dispense Refill  . albuterol (VENTOLIN HFA) 108 (90 Base) MCG/ACT inhaler INHALE 2 PUFFS INTO THE LUNGS EVERY 6 HOURS AS NEEDED FOR WHEEZING OR SHORTNESS OF BREATH 6.7 g 0  . apixaban (ELIQUIS) 5 MG TABS tablet TAKE 1 TABLET(5 MG) BY MOUTH TWICE DAILY 60 tablet 5  . buPROPion (WELLBUTRIN XL) 150 MG 24 hr tablet TAKE 1 TABLET(150 MG) BY MOUTH DAILY 90 tablet 1  . diclofenac Sodium (VOLTAREN) 1 % GEL Apply 4 g topically 4 (four) times daily. 100 g 0  . furosemide (LASIX) 40 MG tablet TAKE 2 TABLETS(80 MG) BY MOUTH DAILY IN THE MORNING (Patient taking differently: Take 40 mg by mouth daily.) 180 tablet 0  . Multiple Vitamin (MULTIVITAMIN WITH MINERALS) TABS tablet Take 1 tablet by mouth daily.    . Omega-3 Fatty Acids (FISH OIL) 1000 MG CAPS Take 1,000 mg by mouth daily. 300 mg omega 3    . omeprazole (PRILOSEC) 20 MG capsule TAKE 1 CAPSULE(20 MG) BY MOUTH DAILY 90 capsule 1  . rosuvastatin (CRESTOR) 40 MG tablet Take 1 tablet (40 mg total)  by mouth daily. 90 tablet 3  . sacubitril-valsartan (ENTRESTO) 24-26 MG TAKE 1 TABLET BY MOUTH TWICE DAILY. 180 tablet 3  . spironolactone (ALDACTONE) 25 MG tablet Take 1 tablet (25 mg total) by mouth daily. 90 tablet 3  . traMADol (ULTRAM) 50 MG tablet Take 1 tablet (50 mg total) by mouth every 12 (twelve) hours as needed. 30 tablet 1  . traZODone (DESYREL) 150 MG tablet TAKE 1 TABLET(150 MG) BY MOUTH AT BEDTIME 90 tablet 0  . metoprolol succinate (TOPROL-XL) 25 MG 24 hr tablet TAKE 1 TABLET BY MOUTH DAILY (Patient not taking: Reported on 05/15/2023) 90  tablet 3   No facility-administered medications prior to visit.      Review of Systems  Review of Systems   Physical Exam  BP 95/62 (BP Location: Right Arm, Patient Position: Sitting, Cuff Size: Large)   Pulse 91   Temp (!) 97.5 F (36.4 C) (Temporal)   Ht 5' 10.5" (1.791 m)   Wt (!) 377 lb 3.2 oz (171.1 kg)   SpO2 92%   BMI 53.36 kg/m  Physical Exam   Lab Results:  CBC    Component Value Date/Time   WBC 8.6 11/28/2022 1423   RBC 5.55 11/28/2022 1423   HGB 16.3 04/24/2023 0952   HCT 48.0 04/24/2023 0952   PLT 243 11/28/2022 1423   MCV 83.6 11/28/2022 1423   MCH 27.4 11/28/2022 1423   MCHC 32.8 11/28/2022 1423   RDW 15.4 11/28/2022 1423   LYMPHSABS 1.5 11/07/2022 0926   MONOABS 0.5 11/07/2022 0926   EOSABS 0.1 11/07/2022 0926   BASOSABS 0.0 11/07/2022 0926    BMET    Component Value Date/Time   NA 144 05/15/2023 1425   K 4.1 05/15/2023 1425   CL 105 05/15/2023 1425   CO2 23 05/15/2023 1425   GLUCOSE 81 05/15/2023 1425   GLUCOSE 91 04/24/2023 0952   BUN 14 05/15/2023 1425   CREATININE 1.13 05/15/2023 1425   CREATININE 0.94 10/03/2016 0959   CALCIUM 8.8 05/15/2023 1425   GFRNONAA >60 11/28/2022 1423   GFRNONAA 84 10/03/2016 0959   GFRAA 90 07/04/2020 1159   GFRAA >89 10/03/2016 0959    BNP No results found for: "BNP"  ProBNP    Component Value Date/Time   PROBNP 32.9 08/30/2012 2358    Imaging: No results found.   Assessment & Plan:   No problem-specific Assessment & Plan notes found for this encounter.     Glenford Bayley, NP 06/06/2023

## 2023-06-09 ENCOUNTER — Other Ambulatory Visit: Payer: Self-pay

## 2023-06-09 MED ORDER — ROSUVASTATIN CALCIUM 40 MG PO TABS: 40.0000 mg | ORAL_TABLET | Freq: Every day | ORAL | 3 refills | Status: DC

## 2023-06-26 ENCOUNTER — Ambulatory Visit: Payer: Medicare Other

## 2023-06-26 DIAGNOSIS — G4730 Sleep apnea, unspecified: Secondary | ICD-10-CM | POA: Diagnosis not present

## 2023-06-26 DIAGNOSIS — Z8669 Personal history of other diseases of the nervous system and sense organs: Secondary | ICD-10-CM

## 2023-07-08 NOTE — Progress Notes (Signed)
 HPI: Follow-up atrial fibrillation, edema and cardiomyopathy. 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 Seen for edema October 2019.  Follow-up echo November 2019 showed ejection fraction 40 to 45% and mild diastolic dysfunction.  Biatrial enlargement.  Severe right ventricular enlargement. Nuclear study January 2020 showed ejection fraction 47% and no ischemia or infarction.  Abdominal ultrasound September 2020 showed no aneurysm.  Chest CT July 2024 showed aortic atherosclerosis, coronary calcification and enlarged pulmonary trunk; also with emphysema. Follow-up echocardiogram October 2024 showed normal LV function, mild left ventricular hypertrophy mild aortic insufficiency.  Recently found to be in atrial fibrillation.  Monitor October 2024 showed atrial fibrillation rate controlled.  Patient had cardioversion April 24, 2023.  Sinus was reestablished briefly but atrial fibrillation returned.  At last office visit we elected rate control as he was essentially asymptomatic.  Since last seen he denies dyspnea, chest pain, palpitations, syncope or bleeding.  Current Outpatient Medications  Medication Sig Dispense Refill   albuterol (VENTOLIN HFA) 108 (90 Base) MCG/ACT inhaler INHALE 2 PUFFS INTO THE LUNGS EVERY 6 HOURS AS NEEDED FOR WHEEZING OR SHORTNESS OF BREATH 6.7 g 0   apixaban (ELIQUIS) 5 MG TABS tablet TAKE 1 TABLET(5 MG) BY MOUTH TWICE DAILY 60 tablet 5   buPROPion (WELLBUTRIN XL) 150 MG 24 hr tablet TAKE 1 TABLET(150 MG) BY MOUTH DAILY 90 tablet 1   diclofenac Sodium (VOLTAREN) 1 % GEL Apply 4 g topically 4 (four) times daily. 100 g 0   furosemide (LASIX) 40 MG tablet TAKE 2 TABLETS(80 MG) BY MOUTH DAILY IN THE MORNING (Patient taking differently: Take 40 mg by mouth daily.) 180 tablet 0   metoprolol succinate (TOPROL XL) 25 MG 24 hr tablet Take 1 tablet (25 mg total) by mouth at bedtime. 90 tablet 3   Multiple Vitamin  (MULTIVITAMIN WITH MINERALS) TABS tablet Take 1 tablet by mouth daily.     Omega-3 Fatty Acids (FISH OIL) 1000 MG CAPS Take 1,000 mg by mouth daily. 300 mg omega 3     omeprazole (PRILOSEC) 20 MG capsule TAKE 1 CAPSULE(20 MG) BY MOUTH DAILY 90 capsule 1   rosuvastatin (CRESTOR) 40 MG tablet Take 1 tablet (40 mg total) by mouth daily. 90 tablet 3   sacubitril-valsartan (ENTRESTO) 24-26 MG TAKE 1 TABLET BY MOUTH TWICE DAILY. 180 tablet 3   spironolactone (ALDACTONE) 25 MG tablet Take 1 tablet (25 mg total) by mouth daily. 90 tablet 3   traZODone (DESYREL) 150 MG tablet TAKE 1 TABLET(150 MG) BY MOUTH AT BEDTIME 90 tablet 0   traMADol (ULTRAM) 50 MG tablet Take 1 tablet (50 mg total) by mouth every 12 (twelve) hours as needed. (Patient not taking: Reported on 07/21/2023) 30 tablet 1   No current facility-administered medications for this visit.     Past Medical History:  Diagnosis Date   Arthritis    OA of knees   CHF (congestive heart failure) (HCC)    Closed compression fracture of L1 lumbar vertebra 09/03/2016   Colon polyp    Complication of anesthesia    slow to wake up   COPD (chronic obstructive pulmonary disease) (HCC)    > 30 pack yr smoker   Depression    Anxiety and Insomnia   Diverticulosis    Erectile dysfunction    GERD (gastroesophageal reflux disease)    Hemorrhoids    Hep C w/o coma, chronic (HCC) 06/12/2015   treated with medications  Leg edema    Morbid obesity (HCC)    Sleep apnea    refuses CPAP   Substance abuse (HCC)    hx alcohol and cocaine use, 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, 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    Past Surgical History:  Procedure Laterality Date   CARDIOVERSION N/A 04/24/2023   Procedure: CARDIOVERSION;  Surgeon: Christell Constant, MD;  Location: [facility];  Service: Cardiovascular;  Laterality: N/A;   COLONOSCOPY  04/18/2011   Procedure: COLONOSCOPY;  Surgeon: Yancey Flemings, MD;  Location: [facility];  Service: Endoscopy;   Laterality: N/A;   COLONOSCOPY WITH PROPOFOL N/A 05/07/2016   Procedure: COLONOSCOPY WITH PROPOFOL;  Surgeon: Hilarie Fredrickson, MD;  Location: [facility];  Service: Endoscopy;  Laterality: N/A;   COLONOSCOPY WITH PROPOFOL N/A 08/12/2017   Procedure: COLONOSCOPY WITH PROPOFOL;  Surgeon: Hilarie Fredrickson, MD;  Location: [facility];  Service: Endoscopy;  Laterality: N/A;   COLONOSCOPY WITH PROPOFOL N/A 01/25/2021   Procedure: COLONOSCOPY WITH PROPOFOL;  Surgeon: Hilarie Fredrickson, MD;  Location: [facility];  Service: Endoscopy;  Laterality: N/A;   ESOPHAGOGASTRODUODENOSCOPY (EGD) WITH PROPOFOL N/A 05/07/2016   Procedure: ESOPHAGOGASTRODUODENOSCOPY (EGD) WITH PROPOFOL;  Surgeon: Hilarie Fredrickson, MD;  Location: [facility];  Service: Endoscopy;  Laterality: N/A;   ESOPHAGOGASTRODUODENOSCOPY (EGD) WITH PROPOFOL N/A 01/25/2021   Procedure: ESOPHAGOGASTRODUODENOSCOPY (EGD) WITH PROPOFOL;  Surgeon: Hilarie Fredrickson, MD;  Location: [facility];  Service: Endoscopy;  Laterality: N/A;   KNEE ARTHROSCOPY  2011   left   LUMBAR LAMINECTOMY  1994, 2010   POLYPECTOMY  01/25/2021   Procedure: POLYPECTOMY;  Surgeon: Hilarie Fredrickson, MD;  Location: [facility];  Service: Endoscopy;;   TIBIA FRACTURE SURGERY  2006   hardware   TONSILLECTOMY  1963    Social History   Socioeconomic History   Marital status: Married    Spouse name: Not on file   Number of children: 4   Years of education: Not on file   Highest education level: Not on file  Occupational History   Occupation: retired  Tobacco Use   Smoking status: Former    Current packs/day: 1.00    Average packs/day: 1 pack/day for 60.2 years (60.2 ttl pk-yrs)    Types: Cigarettes    Start date: 05/21/1963    Passive exposure: Past   Smokeless tobacco: Never   Tobacco comments:    Pt states he quit smoking 2 months ago  Vaping Use   Vaping status: Never Used  Substance and Sexual Activity   Alcohol use: No    Alcohol/week: 0.0 standard drinks of alcohol   Drug  use: No    Comment: last dose November 2012   Sexual activity: Yes    Partners: Female    Comment: states no alcohol in 3 weeks  Other Topics Concern   Not on file  Social History Narrative   Updated 06/12/15   Work or School: none, 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, environmental in the past      Home Situation: lives with wife and grandson      Spiritual Beliefs: Ephriam Knuckles, does not attend church      Lifestyle: no regular exercise, diet is not great but is interested in this      Social Drivers of Health   Financial Resource Strain: Low Risk  (11/06/2022)   Overall Financial Resource Strain (CARDIA)    Difficulty of Paying Living Expenses: Not very hard  Food Insecurity: No Food Insecurity (11/06/2022)  Hunger Vital Sign    Worried About Running Out of Food in the Last Year: Never true    Ran Out of Food in the Last Year: Never true  Transportation Needs: No Transportation Needs (11/06/2022)   PRAPARE - Administrator, Civil Service (Medical): No    Lack of Transportation (Non-Medical): No  Physical Activity: Inactive (11/06/2022)   Exercise Vital Sign    Days of Exercise per Week: 0 days    Minutes of Exercise per Session: 0 min  Stress: No Stress Concern Present (11/06/2022)   [employer] of Occupational Health - Occupational Stress Questionnaire    Feeling of Stress : Not at all  Social Connections: Moderately Integrated (11/06/2022)   Social Connection and Isolation Panel [NHANES]    Frequency of Communication with Friends and Family: More than three times a week    Frequency of Social Gatherings with Friends and Family: More than three times a week    Attends Religious Services: 1 to 4 times per year    Active Member of [employer] or Organizations: No    Attends Banker Meetings: Never    Marital Status: Married  Catering manager Violence: Not At Risk (11/06/2022)   Humiliation, Afraid, Rape, and Kick questionnaire    Fear of Current or Ex-Partner: No    Emotionally  Abused: No    Physically Abused: No    Sexually Abused: No    Family History  Problem Relation Age of Onset   Breast cancer Mother    Heart disease Father 57   Colon cancer Sister    Cancer Sister 54       sister, colon    Heart disease Other    Stomach cancer Neg Hx    Rectal cancer Neg Hx    Esophageal cancer Neg Hx    Pancreatic cancer Neg Hx     ROS: no fevers or chills, productive cough, hemoptysis, dysphasia, odynophagia, melena, hematochezia, dysuria, hematuria, rash, seizure activity, orthopnea, PND, claudication. Remaining systems are negative.  Physical Exam: Well-developed obese in no acute distress.  Skin is warm and dry.  HEENT is normal.  Neck is supple.  Chest is clear to auscultation with normal expansion.  Cardiovascular exam is irregular Abdominal exam nontender or distended. No masses palpated. Extremities show no edema. neuro grossly intact        A/P  1 persistent atrial fibrillation-patient underwent cardioversion December 5.  Sinus was reestablished briefly but atrial fibrillation recurred.  He continues to do well from a symptomatic standpoint.  I therefore feel that rate control is appropriate.  Continue present dose of Toprol.  Continue apixaban.  Check hemoglobin and renal function.  2 history of cardiomyopathy-felt to be nonischemic in the past as previous nuclear study showed no ischemia.  LV function has improved on most recent echocardiogram.  Will continue present regimen including Entresto and Toprol.  3 chronic combined systolic/diastolic congestive heart failure-he is euvolemic.  Continue diuretic at present dose.  We have not added SGLT2 inhibitor as he has severe obesity/pannus and I am concerned about the risk of infection.  Check renal function.  4 coronary calcification-continue statin.  Patient denies chest pain.  5 morbid obesity-we discussed importance of weight loss.  6 tobacco abuse-patient counseled on discontinuing.  7  lower extremity edema-patient is felt likely to have pulmonary hypertension related to a combination of pulmonary venous hypertension, obesity hypoventilation syndrome, obstructive sleep apnea and emphysema.  Will continue diuretic.  8 sleep apnea-noted to have sleep apnea on recent sleep study.  Will arrange follow-up with pulmonary.  Olga Millers, MD

## 2023-07-11 DIAGNOSIS — G4733 Obstructive sleep apnea (adult) (pediatric): Secondary | ICD-10-CM | POA: Diagnosis not present

## 2023-07-17 NOTE — Progress Notes (Signed)
 Please patient know home sleep study 06/26/23 showed moderate obstructive sleep apnea, patient had an average of 26 apneic events per hour.  Recommend patient be started on CPAP 5 to 20 cm H2O with mask of choice (please place order if patient in agreement to therapy).  He will need compliance check-in in 2-3 months. If patient would like to discuss sleep study results and treatment options further please set up virtual visit.

## 2023-07-20 ENCOUNTER — Other Ambulatory Visit: Payer: Self-pay | Admitting: Internal Medicine

## 2023-07-20 DIAGNOSIS — G4700 Insomnia, unspecified: Secondary | ICD-10-CM

## 2023-07-21 ENCOUNTER — Other Ambulatory Visit: Payer: Self-pay | Admitting: Internal Medicine

## 2023-07-21 ENCOUNTER — Encounter: Payer: Self-pay | Admitting: Cardiology

## 2023-07-21 ENCOUNTER — Ambulatory Visit: Payer: Medicare Other | Attending: Cardiology | Admitting: Cardiology

## 2023-07-21 VITALS — BP 110/68 | HR 74 | Ht 71.0 in | Wt 392.6 lb

## 2023-07-21 DIAGNOSIS — I10 Essential (primary) hypertension: Secondary | ICD-10-CM

## 2023-07-21 DIAGNOSIS — I428 Other cardiomyopathies: Secondary | ICD-10-CM | POA: Diagnosis not present

## 2023-07-21 DIAGNOSIS — E785 Hyperlipidemia, unspecified: Secondary | ICD-10-CM | POA: Diagnosis not present

## 2023-07-21 DIAGNOSIS — I5042 Chronic combined systolic (congestive) and diastolic (congestive) heart failure: Secondary | ICD-10-CM | POA: Diagnosis not present

## 2023-07-21 DIAGNOSIS — I4891 Unspecified atrial fibrillation: Secondary | ICD-10-CM

## 2023-07-21 LAB — CBC

## 2023-07-21 MED ORDER — FUROSEMIDE 40 MG PO TABS: ORAL_TABLET | ORAL | 1 refills | Status: DC

## 2023-07-21 NOTE — Telephone Encounter (Signed)
 Copied from CRM [id number]. Topic: Clinical - Medication Refill >> Jul 21, 2023  1:56 PM Hector Shade B wrote: Most Recent Primary Care Visit:  Provider: [facility]  Department: [facility]  Visit Type: LAB  Date: 11/07/2022  Medication:  furosemide (LASIX) 40 MG tablet  Has the patient contacted their pharmacy? Yes (Agent: If no, request that the patient contact the pharmacy for the refill. If patient does not wish to contact the pharmacy document the reason why and proceed with request.) (Agent: If yes, when and what did the pharmacy advise?)Patient stated the pharmacy told him to request the refill with his PCP  Is this the correct pharmacy for this prescription? Yes If no, delete pharmacy and type the correct one.  This is the patient's preferred pharmacy:  [facility] [address] [place] - [address] AT [facility] [address] [place] [address] Phone: [phone] Fax: [phone]   Has the prescription been filled recently? No  Is the patient out of the medication? Yes  Has the patient been seen for an appointment in the last year OR does the patient have an upcoming appointment? Yes  Can we respond through MyChart? Yes   Agent: Please be advised that Rx refills may take up to 3 business days. We ask that you follow-up with your pharmacy.

## 2023-07-21 NOTE — Patient Instructions (Addendum)
 Medication Instructions:   NO CHANGE  *If you need a refill on your cardiac medications before your next appointment, please call your pharmacy*   Lab Work:  Your physician recommends that you HAVE LAB WORK TODAY  If you have labs (blood work) drawn today and your tests are completely normal, you will receive your results only by: MyChart Message (if you have MyChart) OR A paper copy in the mail If you have any lab test that is abnormal or we need to change your treatment, we will call you to review the results.   Follow-Up: At [facility], you and your health needs are our priority.  As part of our continuing mission to provide you with exceptional heart care, we have created designated Provider Care Teams.  These Care Teams include your primary Cardiologist (physician) and Advanced Practice Providers (APPs -  Physician Assistants and Nurse Practitioners) who all work together to provide you with the care you need, when you need it.    Your next appointment:   6 month(s)  Provider:   Olga Millers, MD     CALL Derrick Mata [facility] OFFICE FOR APPOINTMENT TO DISCUSS CPAP

## 2023-07-22 ENCOUNTER — Encounter: Payer: Self-pay | Admitting: *Deleted

## 2023-07-22 LAB — BASIC METABOLIC PANEL
BUN/Creatinine Ratio: 15 (ref 10–24)
BUN: 16 mg/dL (ref 8–27)
CO2: 25 mmol/L (ref 20–29)
Calcium: 9 mg/dL (ref 8.6–10.2)
Chloride: 105 mmol/L (ref 96–106)
Creatinine, Ser: 1.05 mg/dL (ref 0.76–1.27)
Glucose: 81 mg/dL (ref 70–99)
Potassium: 4.2 mmol/L (ref 3.5–5.2)
Sodium: 142 mmol/L (ref 134–144)
eGFR: 74 mL/min/{1.73_m2} (ref >59)

## 2023-07-22 LAB — CBC
Hematocrit: 44.2 % (ref 37.5–51.0)
Hemoglobin: 14.4 g/dL (ref 13.0–17.7)
MCH: 28 pg (ref 26.6–33.0)
MCHC: 32.6 g/dL (ref 31.5–35.7)
MCV: 86 fL (ref 79–97)
Platelets: 221 10*3/uL (ref 150–450)
RBC: 5.14 x10E6/uL (ref 4.14–5.80)
RDW: 13.6 % (ref 11.6–15.4)
WBC: 6.9 10*3/uL (ref 3.4–10.8)

## 2023-07-29 ENCOUNTER — Telehealth: Payer: Self-pay | Admitting: Primary Care

## 2023-07-29 DIAGNOSIS — G4733 Obstructive sleep apnea (adult) (pediatric): Secondary | ICD-10-CM

## 2023-07-29 NOTE — Telephone Encounter (Signed)
 PT calling for HST results. He has made an appt for a FU but would like a call. He said his other Dr. saw them and said he needed a CPAP. That made him upset that we had not called him yet. His # is [phone]

## 2023-07-29 NOTE — Telephone Encounter (Signed)
 ATC X1. Mailbox is full. Our office messaged through Mychart. Pt never responded back and it does state the pt read our message. I sent a message back to the pt to reply back or call our office so we can see what the next steps are for his care.

## 2023-07-30 NOTE — Telephone Encounter (Signed)
 Tried to call PT to move appt up by a few weeks for a CPAP FU. Not able to leave a VM.

## 2023-07-30 NOTE — Telephone Encounter (Signed)
 PT ret you call. States he will move fwd with a CPAP machine. Please call to advise him of the next steps. [phone]

## 2023-07-30 NOTE — Telephone Encounter (Signed)
 Home sleep study 06/26/23 showed moderate obstructive sleep apnea, patient had an average of 26 apneic events per hour.   Recommend patient be started on CPAP 5 to 20 cm H2O with mask of choice   I have place CPAP order, please set up follow-up in 8 weeks for compliance check (can be virtual visit on Friday afternoon)

## 2023-08-01 ENCOUNTER — Other Ambulatory Visit: Payer: Self-pay | Admitting: Internal Medicine

## 2023-08-13 NOTE — Telephone Encounter (Signed)
 Appt made. NFN

## 2023-08-19 ENCOUNTER — Other Ambulatory Visit: Payer: Self-pay | Admitting: Internal Medicine

## 2023-08-19 DIAGNOSIS — F3342 Major depressive disorder, recurrent, in full remission: Secondary | ICD-10-CM

## 2023-09-15 ENCOUNTER — Other Ambulatory Visit: Payer: Self-pay | Admitting: *Deleted

## 2023-09-15 ENCOUNTER — Ambulatory Visit: Admitting: Primary Care

## 2023-09-15 DIAGNOSIS — I4891 Unspecified atrial fibrillation: Secondary | ICD-10-CM

## 2023-09-15 MED ORDER — APIXABAN 5 MG PO TABS: 5.0000 mg | ORAL_TABLET | Freq: Two times a day (BID) | ORAL | 5 refills | Status: DC

## 2023-09-15 NOTE — Progress Notes (Deleted)
 @Patient  ID: Derrick Mata, male    DOB: 1951/05/08, 73 y.o.   MRN: [id number]  No chief complaint on file.   Referring provider: Zilphia Hilt, [place]*  HPI:  73 year old male, former smoker.  Past medical history significant for congestive heart failure, aortic arthrosclerosis, hypertension, portal hypertension, OSA, COPD, osteoarthritis, hepatic cirrhosis.  06/06/2023 Discussed the use of AI scribe software for clinical note transcription with the patient, who gave verbal consent to proceed.  History of Present Illness   The patient, with a known history of atrial fibrillation (AFib), was referred for a sleep consult by his cardiologist due to concerns of potential sleep apnea. The patient's AFib was first diagnosed in 2000, with the most recent episode occurring in December, for which he underwent an unsuccessful cardioversion. The patient was previously on metoprolol  and aspirin , but reports that these medications were discontinued, and he is currently on Eliquis  and Entresto . The patient also takes Lasix  for fluid management.  The patient has a history of sleep apnea and used to use a CPAP machine, but discontinued its use around 2000 due to inconvenience. He reports sleeping in a recliner every night since then, and his spouse reports no snoring. The patient has lost weight in the past year, but the exact amount is not specified.    Typical bedtime is between 1130 and 12 AM.  Takes him 40 to 45 minutes to fall asleep.  He wakes up on average 2 times a night.  He starts his day between 5 and 5:15 AM.  He has lost 60 pounds.  He does not operate heavy machinery.  Last sleep study was done at [facility].  He is not currently wearing CPAP or oxygen.  Epworth score 2.    The patient's medication regimen appears to be a point of confusion, as he reports being taken off metoprolol  and aspirin , but the cardiologist's note suggests he should still be on Toprol  XL. The patient plans to  clarify this with his cardiologist.    09/15/2023- interim hx   Home sleep study 06/26/23 showed moderate obstructive sleep apnea, patient had an average of 26 apneic events per hour.    Recommend patient be started on CPAP 5 to 20 cm H2O with mask of choice   Patient is not currently on treatment     Allergies  Allergen Reactions   Penicillins Anaphylaxis, Nausea Only and Other (See Comments)    Passed out.   Sulfamethoxazole Hives    Immunization History  Administered Date(s) Administered   Fluad Trivalent(High Dose 65+) 06/06/2023   Hepatitis A, Adult 12/19/2015, 10/03/2016   Hepatitis B, ADULT 08/21/2015, 04/04/2016   Hepatitis B, PED/ADOLESCENT 07/19/2015   Influenza Split 03/28/2011   Influenza Whole 06/23/2007, 02/15/2009, 01/31/2010   Influenza, High Dose Seasonal PF 02/01/2016, 03/10/2017, 02/19/2018, 02/20/2019   Influenza,inj,Quad PF,6+ Mos 01/12/2013, 02/25/2014   Influenza-Unspecified 01/19/2015, 02/17/2017, 05/03/2020, 03/06/2021   PFIZER Comirnaty(Gray Top)Covid-19 Tri-Sucrose Vaccine 06/26/2019, 07/17/2019, 03/03/2020, 03/06/2021   Pneumococcal Conjugate-13 09/08/2014   Pneumococcal Polysaccharide-23 07/17/2015   Tdap 08/29/2021   Zoster Recombinant(Shingrix) 04/02/2017, 07/29/2017   Zoster, Live 02/08/2012    Past Medical History:  Diagnosis Date   Arthritis    OA of knees   CHF (congestive heart failure) (HCC)    Closed compression fracture of L1 lumbar vertebra 09/03/2016   Colon polyp    Complication of anesthesia    slow to wake up   COPD (chronic obstructive pulmonary disease) (HCC)    > 30  pack yr smoker   Depression    Anxiety and Insomnia   Diverticulosis    Erectile dysfunction    GERD (gastroesophageal reflux disease)    Hemorrhoids    Hep C w/o coma, chronic (HCC) 06/12/2015   treated with medications   Leg edema    Morbid obesity (HCC)    Sleep apnea    refuses CPAP   Substance abuse (HCC)    hx alcohol  and cocaine use, 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, 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    Tobacco History: Social History   Tobacco Use  Smoking Status Former   Current packs/day: 1.00   Average packs/day: 1 pack/day for 60.3 years (60.3 ttl pk-yrs)   Types: Cigarettes   Start date: 05/21/1963   Passive exposure: Past  Smokeless Tobacco Never  Tobacco Comments   Pt states he quit smoking 2 months ago   Counseling given: Not Answered Tobacco comments: Pt states he quit smoking 2 months ago   Outpatient Medications Prior to Visit  Medication Sig Dispense Refill   albuterol  (VENTOLIN  HFA) 108 (90 Base) MCG/ACT inhaler INHALE 2 PUFFS INTO THE LUNGS EVERY 6 HOURS AS NEEDED FOR WHEEZING OR SHORTNESS OF BREATH 6.7 g 0   apixaban  (ELIQUIS ) 5 MG TABS tablet TAKE 1 TABLET(5 MG) BY MOUTH TWICE DAILY 60 tablet 5   buPROPion  (WELLBUTRIN  XL) 150 MG 24 hr tablet TAKE 1 TABLET(150 MG) BY MOUTH DAILY 90 tablet 0   diclofenac  Sodium (VOLTAREN ) 1 % GEL Apply 4 g topically 4 (four) times daily. 100 g 0   furosemide  (LASIX ) 40 MG tablet TAKE 2 TABLETS(80 MG) BY MOUTH DAILY IN THE MORNING 180 tablet 1   metoprolol  succinate (TOPROL  XL) 25 MG 24 hr tablet Take 1 tablet (25 mg total) by mouth at bedtime. 90 tablet 3   Multiple Vitamin (MULTIVITAMIN WITH MINERALS) TABS tablet Take 1 tablet by mouth daily.     Omega-3 Fatty Acids (FISH OIL) 1000 MG CAPS Take 1,000 mg by mouth daily. 300 mg omega 3     omeprazole  (PRILOSEC) 20 MG capsule TAKE 1 CAPSULE(20 MG) BY MOUTH DAILY 90 capsule 0   rosuvastatin  (CRESTOR ) 40 MG tablet Take 1 tablet (40 mg total) by mouth daily. 90 tablet 3   sacubitril -valsartan  (ENTRESTO ) 24-26 MG TAKE 1 TABLET BY MOUTH TWICE DAILY. 180 tablet 3   spironolactone  (ALDACTONE ) 25 MG tablet Take 1 tablet (25 mg total) by mouth daily. 90 tablet 3   traMADol  (ULTRAM ) 50 MG tablet Take 1 tablet (50 mg total) by mouth every 12 (twelve) hours as needed. (Patient not taking: Reported on 07/21/2023) 30 tablet 1   traZODone   (DESYREL ) 150 MG tablet TAKE 1 TABLET(150 MG) BY MOUTH AT BEDTIME 90 tablet 0   No facility-administered medications prior to visit.      Review of Systems  Review of Systems   Physical Exam  There were no vitals taken for this visit. Physical Exam   Lab Results:  CBC    Component Value Date/Time   WBC 6.9 07/21/2023 1044   WBC 8.6 11/28/2022 1423   RBC 5.14 07/21/2023 1044   RBC 5.55 11/28/2022 1423   HGB 14.4 07/21/2023 1044   HCT 44.2 07/21/2023 1044   PLT 221 07/21/2023 1044   MCV 86 07/21/2023 1044   MCH 28.0 07/21/2023 1044   MCH 27.4 11/28/2022 1423   MCHC 32.6 07/21/2023 1044   MCHC 32.8 11/28/2022 1423   RDW 13.6 07/21/2023 1044   LYMPHSABS  1.5 11/07/2022 0926   MONOABS 0.5 11/07/2022 0926   EOSABS 0.1 11/07/2022 0926   BASOSABS 0.0 11/07/2022 0926    BMET    Component Value Date/Time   NA 142 07/21/2023 1044   K 4.2 07/21/2023 1044   CL 105 07/21/2023 1044   CO2 25 07/21/2023 1044   GLUCOSE 81 07/21/2023 1044   GLUCOSE 91 04/24/2023 0952   BUN 16 07/21/2023 1044   CREATININE 1.05 07/21/2023 1044   CREATININE 0.94 10/03/2016 0959   CALCIUM  9.0 07/21/2023 1044   GFRNONAA >60 11/28/2022 1423   GFRNONAA 84 10/03/2016 0959   GFRAA 90 07/04/2020 1159   GFRAA >89 10/03/2016 0959    BNP No results found for: "BNP"  ProBNP    Component Value Date/Time   PROBNP 32.9 08/30/2012 2358    Imaging: No results found.   Assessment & Plan:   No problem-specific Assessment & Plan notes found for this encounter.     Antonio Baumgarten, NP 09/15/2023

## 2023-09-15 NOTE — Telephone Encounter (Signed)
 Eliquis  5mg  refill request received. Patient is 73 years old, weight-178.1kg, Crea-1.05 on 07/21/23, Diagnosis-Afib, and last seen by Dr. Audery Blazing on 07/21/23. Dose is appropriate based on dosing criteria. Will send in refill to requested pharmacy.

## 2023-09-18 ENCOUNTER — Ambulatory Visit: Payer: Medicare Other | Admitting: Family Medicine

## 2023-09-18 DIAGNOSIS — Z0000 Encounter for general adult medical examination without abnormal findings: Secondary | ICD-10-CM

## 2023-09-18 NOTE — Progress Notes (Signed)
 PATIENT CHECK-IN and HEALTH RISK ASSESSMENT QUESTIONNAIRE:  -completed by phone/video for upcoming Medicare Preventive Visit  Pre-Visit Check-in: 1)Vitals (height, wt, BP, etc) - record in vitals section for visit on day of visit Request home vitals (wt, BP, etc.) and enter into vitals, THEN update Vital Signs SmartPhrase below at the top of the HPI. See below.  2)Review and Update Medications, Allergies PMH, Surgeries, Social history in Epic 3)Hospitalizations in the last year with date/reason? n  4)Review and Update Care Team (patient's specialists) in Epic 5) Complete PHQ9 in Epic  6) Complete Fall Screening in Epic 7)Review all Health Maintenance Due and order under PCP if not done.  Medicare Wellness Patient Questionnaire:  Answer theses question about your habits: How often do you have a drink containing alcohol ? Never anymore  Have you ever smoked? Quit 5 months ago How many packs a day do/did you smoke? Had smoked 1ppd Do you use smokeless tobacco?n Do you use an illicit drugs?n On average, how many days per week do you engage in moderate to strenuous exercise (like a brisk walk)?none, reports cardiologist told him not to exercise Typical Diet: he is working on diet to try to lose weight - trying to eat healthy, eating lots of bread  Beverages: water  Answer theses question about your everyday activities: Can you perform most household chores?n Are you deaf or have significant trouble hearing?n Do you feel that you have a problem with memory?n Do you feel safe at home?y Last dentist visit? Goes every 6-8 months 8. Do you have any difficulty performing your everyday activities?n - can do everything Are you having any difficulty walking, taking medications on your own, and or difficulty managing daily home needs?n Do you have difficulty walking or climbing stairs?n Do you have difficulty dressing or bathing?n Do you have difficulty doing errands alone such as visiting a  doctor's office or shopping?n Do you currently have any difficulty preparing food and eating?n Do you currently have any difficulty using the toilet?n Do you have any difficulty managing your finances?n Do you have any difficulties with housekeeping of managing your housekeeping?n   Do you have Advanced Directives in place (Living Will, Healthcare Power or Attorney)?  no   Last eye Exam and location? [facility], Jan 2025; also sees Dr. Seward Dao   Do you currently use prescribed or non-prescribed narcotic or opioid pain medications? n  Do you have a history or close family history of breast, ovarian, tubal or peritoneal cancer or a family member with BRCA (breast cancer susceptibility 1 and 2) gene mutations?   ----------------------------------------------------------------------------------------------------------------------------------------------------------------------------------------------------------------------  Because this visit was a virtual/telehealth visit, some criteria may be missing or patient reported. Any vitals not documented were not able to be obtained and vitals that have been documented are patient reported.    MEDICARE ANNUAL PREVENTIVE CARE VISIT WITH PROVIDER (Welcome to Medicare, initial annual wellness or annual wellness exam)  Virtual Visit via Phone Note  I connected with Derrick Mata on 09/18/23  by phone and verified that I am speaking with the correct person using two identifiers. He tried to do a video visit but it did not work so he preferred to continued by phone.   Location patient: home Location provider:work or home office Persons participating in the virtual visit: patient, provider  Concerns and/or follow up today: Seeing cardiologist for A. Fib. Had some medication changes last year. Reports no concerns.    See HM section in Epic for other details  of completed HM.    ROS: negative for report of fevers, unintentional weight  loss, vision changes, vision loss, hearing loss or change, chest pain, sob, hemoptysis, melena, hematochezia, hematuria, falls, bleeding or bruising, thoughts of suicide or self harm, memory loss  Patient-completed extensive health risk assessment - reviewed and discussed with the patient: See Health Risk Assessment completed with patient prior to the visit either above or in recent phone note. This was reviewed in detailed with the patient today and appropriate recommendations, orders and referrals were placed as needed per Summary below and patient instructions.   Review of Medical History: -PMH, PSH, Family History and current specialty and care providers reviewed and updated and listed below   Patient Care Team: Zilphia Hilt, Charyl Coppersmith, MD as PCP - General (Internal Medicine) Lenise Quince, MD as PCP - Cardiology (Cardiology) Comer, Judithann Novas, MD as Consulting Physician (Infectious Diseases) Amedeo Jupiter, MD as Consulting Physician (Ophthalmology) Duke, Warren Haber, PA as Physician Assistant (Cardiology) Carnell Christian, [facility] (Pharmacist)   Past Medical History:  Diagnosis Date   Arthritis    OA of knees   CHF (congestive heart failure) (HCC)    Closed compression fracture of L1 lumbar vertebra 09/03/2016   Colon polyp    Complication of anesthesia    slow to wake up   COPD (chronic obstructive pulmonary disease) (HCC)    > 30 pack yr smoker   Depression    Anxiety and Insomnia   Diverticulosis    Erectile dysfunction    GERD (gastroesophageal reflux disease)    Hemorrhoids    Hep C w/o coma, chronic (HCC) 06/12/2015   treated with medications   Leg edema    Morbid obesity (HCC)    Sleep apnea    refuses CPAP   Substance abuse (HCC)    hx alcohol  and cocaine use, 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, 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    Past Surgical History:  Procedure Laterality Date   CARDIOVERSION N/A 04/24/2023   Procedure: CARDIOVERSION;  Surgeon:  Jann Melody, MD;  Location: [facility];  Service: Cardiovascular;  Laterality: N/A;   COLONOSCOPY  04/18/2011   Procedure: COLONOSCOPY;  Surgeon: Legrand Puma, MD;  Location: [facility];  Service: Endoscopy;  Laterality: N/A;   COLONOSCOPY WITH PROPOFOL  N/A 05/07/2016   Procedure: COLONOSCOPY WITH PROPOFOL ;  Surgeon: Tobin Forts, MD;  Location: [facility];  Service: Endoscopy;  Laterality: N/A;   COLONOSCOPY WITH PROPOFOL  N/A 08/12/2017   Procedure: COLONOSCOPY WITH PROPOFOL ;  Surgeon: Tobin Forts, MD;  Location: [facility];  Service: Endoscopy;  Laterality: N/A;   COLONOSCOPY WITH PROPOFOL  N/A 01/25/2021   Procedure: COLONOSCOPY WITH PROPOFOL ;  Surgeon: Tobin Forts, MD;  Location: [facility];  Service: Endoscopy;  Laterality: N/A;   ESOPHAGOGASTRODUODENOSCOPY (EGD) WITH PROPOFOL  N/A 05/07/2016   Procedure: ESOPHAGOGASTRODUODENOSCOPY (EGD) WITH PROPOFOL ;  Surgeon: Tobin Forts, MD;  Location: [facility];  Service: Endoscopy;  Laterality: N/A;   ESOPHAGOGASTRODUODENOSCOPY (EGD) WITH PROPOFOL  N/A 01/25/2021   Procedure: ESOPHAGOGASTRODUODENOSCOPY (EGD) WITH PROPOFOL ;  Surgeon: Tobin Forts, MD;  Location: [facility];  Service: Endoscopy;  Laterality: N/A;   KNEE ARTHROSCOPY  2011   left   LUMBAR LAMINECTOMY  1994, 2010   POLYPECTOMY  01/25/2021   Procedure: POLYPECTOMY;  Surgeon: Tobin Forts, MD;  Location: [facility];  Service: Endoscopy;;   TIBIA FRACTURE SURGERY  2006   hardware   TONSILLECTOMY  1963    Social History  Socioeconomic History   Marital status: Married    Spouse name: Not on file   Number of children: 4   Years of education: Not on file   Highest education level: Not on file  Occupational History   Occupation: retired  Tobacco Use   Smoking status: Former    Current packs/day: 1.00    Average packs/day: 1 pack/day for 60.3 years (60.3 ttl pk-yrs)    Types: Cigarettes    Start date: 05/21/1963    Passive exposure: Past   Smokeless  tobacco: Never   Tobacco comments:    Pt states he quit smoking 2 months ago  Vaping Use   Vaping status: Never Used  Substance and Sexual Activity   Alcohol  use: No    Alcohol /week: 0.0 standard drinks of alcohol    Drug use: No    Comment: last dose November 2012   Sexual activity: Yes    Partners: Female    Comment: states no alcohol  in 3 weeks  Other Topics Concern   Not on file  Social History Narrative   Updated 06/12/15   Work or School: none, 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, environmental in the past      Home Situation: lives with wife and grandson      Spiritual Beliefs: Lynder Sanger, does not attend church      Lifestyle: no regular exercise, diet is not great but is interested in this      Social Drivers of Health   Financial Resource Strain: Low Risk  (11/06/2022)   Overall Financial Resource Strain (CARDIA)    Difficulty of Paying Living Expenses: Not very hard  Food Insecurity: No Food Insecurity (11/06/2022)   Hunger Vital Sign    Worried About Running Out of Food in the Last Year: Never true    Ran Out of Food in the Last Year: Never true  Transportation Needs: No Transportation Needs (11/06/2022)   PRAPARE - Administrator, Civil Service (Medical): No    Lack of Transportation (Non-Medical): No  Physical Activity: Inactive (11/06/2022)   Exercise Vital Sign    Days of Exercise per Week: 0 days    Minutes of Exercise per Session: 0 min  Stress: No Stress Concern Present (11/06/2022)   [employer] of Occupational Health - Occupational Stress Questionnaire    Feeling of Stress : Not at all  Social Connections: Moderately Integrated (11/06/2022)   Social Connection and Isolation Panel [NHANES]    Frequency of Communication with Friends and Family: More than three times a week    Frequency of Social Gatherings with Friends and Family: More than three times a week    Attends Religious Services: 1 to 4 times per year    Active Member of [employer] or Organizations: No     Attends Banker Meetings: Never    Marital Status: Married  Catering manager Violence: Not At Risk (11/06/2022)   Humiliation, Afraid, Rape, and Kick questionnaire    Fear of Current or Ex-Partner: No    Emotionally Abused: No    Physically Abused: No    Sexually Abused: No    Family History  Problem Relation Age of Onset   Breast cancer Mother    Heart disease Father 4   Colon cancer Sister    Cancer Sister 87       sister, colon    Heart disease Other    Stomach cancer Neg Hx    Rectal cancer Neg Hx    Esophageal cancer  Neg Hx    Pancreatic cancer Neg Hx     Current Outpatient Medications on File Prior to Visit  Medication Sig Dispense Refill   albuterol  (VENTOLIN  HFA) 108 (90 Base) MCG/ACT inhaler INHALE 2 PUFFS INTO THE LUNGS EVERY 6 HOURS AS NEEDED FOR WHEEZING OR SHORTNESS OF BREATH 6.7 g 0   apixaban  (ELIQUIS ) 5 MG TABS tablet Take 1 tablet (5 mg total) by mouth 2 (two) times daily. 60 tablet 5   buPROPion  (WELLBUTRIN  XL) 150 MG 24 hr tablet TAKE 1 TABLET(150 MG) BY MOUTH DAILY 90 tablet 0   diclofenac  Sodium (VOLTAREN ) 1 % GEL Apply 4 g topically 4 (four) times daily. 100 g 0   furosemide  (LASIX ) 40 MG tablet TAKE 2 TABLETS(80 MG) BY MOUTH DAILY IN THE MORNING 180 tablet 1   metoprolol  succinate (TOPROL  XL) 25 MG 24 hr tablet Take 1 tablet (25 mg total) by mouth at bedtime. 90 tablet 3   Multiple Vitamin (MULTIVITAMIN WITH MINERALS) TABS tablet Take 1 tablet by mouth daily.     Omega-3 Fatty Acids (FISH OIL) 1000 MG CAPS Take 1,000 mg by mouth daily. 300 mg omega 3     omeprazole  (PRILOSEC) 20 MG capsule TAKE 1 CAPSULE(20 MG) BY MOUTH DAILY 90 capsule 0   rosuvastatin  (CRESTOR ) 40 MG tablet Take 1 tablet (40 mg total) by mouth daily. 90 tablet 3   sacubitril -valsartan  (ENTRESTO ) 24-26 MG TAKE 1 TABLET BY MOUTH TWICE DAILY. 180 tablet 3   spironolactone  (ALDACTONE ) 25 MG tablet Take 1 tablet (25 mg total) by mouth daily. 90 tablet 3   traMADol  (ULTRAM ) 50 MG  tablet Take 1 tablet (50 mg total) by mouth every 12 (twelve) hours as needed. (Patient not taking: Reported on 07/21/2023) 30 tablet 1   traZODone  (DESYREL ) 150 MG tablet TAKE 1 TABLET(150 MG) BY MOUTH AT BEDTIME 90 tablet 0   No current facility-administered medications on file prior to visit.    Allergies  Allergen Reactions   Penicillins Anaphylaxis, Nausea Only and Other (See Comments)    Passed out.   Sulfamethoxazole Hives       Physical Exam Vitals requested from patient and listed below if patient had equipment and was able to obtain at home for this virtual visit: There were no vitals filed for this visit. Estimated body mass index is 54.76 kg/m as calculated from the following:   Height as of 07/21/23: 5\' 11"  (1.803 m).   Weight as of 07/21/23: 392 lb 9.6 oz (178.1 kg).  EKG (optional): deferred due to virtual visit  GENERAL: alert, oriented, no acute distress detected; full vision exam deferred due to pandemic and/or virtual encounter  PSYCH/NEURO: pleasant and cooperative, no obvious depression or anxiety, speech and thought processing grossly intact, Cognitive function grossly intact  Flowsheet Row Office Visit from 11/06/2022 in [facility]  PHQ-9 Total Score 0           09/18/2023   10:59 AM 11/06/2022    1:19 PM 11/01/2021    3:37 PM 10/27/2020   10:23 AM 07/04/2020    9:23 AM  Depression screen PHQ 2/9  Decreased Interest 0 0  0 0  Down, Depressed, Hopeless 0 0 0 0 0  PHQ - 2 Score 0 0 0 0 0  Altered sleeping  0 0 0 1  Tired, decreased energy  0 0 1 0  Change in appetite  0 0 0 0  Feeling bad or failure about yourself  0 0 0 0  Trouble concentrating  0 0 0 0  Moving slowly or fidgety/restless  0 0 0 0  Suicidal thoughts  0 0 0 0  PHQ-9 Score  0 0 1 1  Difficult doing work/chores  Not difficult at all Not difficult at all  Not difficult at all       08/29/2021    6:35 PM 11/01/2021    3:38 PM 11/06/2022    1:18 PM  06/06/2023    9:15 AM 09/18/2023   10:59 AM  Fall Risk  Falls in the past year?  1 0 0 0  Was there an injury with Fall?  1 0  0  Fall Risk Category Calculator  3 0  0  Fall Risk Category (Retired)  High     (RETIRED) Patient Fall Risk Level Low fall risk High fall risk     Patient at Risk for Falls Due to  Impaired balance/gait     Fall risk Follow up  Falls evaluation completed Falls evaluation completed       SUMMARY AND PLAN:  Encounter for annual wellness exam in Medicare patient   Discussed applicable health maintenance/preventive health measures and advised and referred or ordered per patient preferences: -discussed vaccine recs, he reports is up to date -he plans to get the lung cancer screening in July Health Maintenance  Topic Date Due   COVID-19 Vaccine (5 - 2024-25 season) 01/19/2023   Lung Cancer Screening  12/18/2023   INFLUENZA VACCINE  12/19/2023   Medicare Annual Wellness (AWV)  09/17/2024   Colonoscopy  01/26/2031   DTaP/Tdap/Td (2 - Td or Tdap) 08/30/2031   Pneumonia Vaccine 65+ Years old  Completed   Hepatitis C Screening  Completed   Zoster Vaccines- Shingrix  Completed   HPV VACCINES  Aged Out   Meningococcal B Vaccine  Aged Out     Education and counseling on the following was provided based on the above review of health and a plan/checklist for the patient, along with additional information discussed, was provided for the patient in the patient instructions :  -Advised on importance of completing advanced directives, discussed options for completing and provided information in patient instructions as well -Advised and counseled on a healthy lifestyle - including the importance of a healthy diet, regular physical activity, s -Reviewed patient's current diet. Advised and counseled on a whole foods based healthy diet. A summary of a healthy diet was provided in the Patient Instructions. Congratulated on changes. -reviewed patient's current physical  activity level and discussed exercise guidelines for adults. Discussed community resources and ideas for safe exercise. Advised consulting with cardiologist before making any changes. Suggested chair exercises may be an option.  -Advise yearly dental visits at minimum and regular eye exams -congratulate don quitting smoking and drinking.   Follow up: see patient instructions   Patient Instructions  I really enjoyed getting to talk with you today! I am available on Tuesdays and Thursdays for virtual visits if you have any questions or concerns, or if I can be of any further assistance.   CHECKLIST FROM ANNUAL WELLNESS VISIT:  -Follow up (please call to schedule if not scheduled after visit):   -yearly for annual wellness visit with primary care office  Here is a list of your preventive care/health maintenance measures and the plan for each if any are due:  PLAN For any measures below that may be due:   Health Maintenance  Topic Date Due   COVID-19  Vaccine (5 - 2024-25 season) 01/19/2023, please provide record of receipt so that we can update your chart   Lung Cancer Screening  12/18/2023, due in July   INFLUENZA VACCINE  12/19/2023   Medicare Annual Wellness (AWV)  09/17/2024   Colonoscopy  01/26/2031   DTaP/Tdap/Td (2 - Td or Tdap) 08/30/2031   Pneumonia Vaccine 83+ Years old  Completed   Hepatitis C Screening  Completed   Zoster Vaccines- Shingrix  Completed   HPV VACCINES  Aged Out   Meningococcal B Vaccine  Aged Out    -See a dentist at least yearly  -Get your eyes checked and then per your eye specialist's recommendations  -Other issues addressed today:   -I have included below further information regarding a healthy whole foods based diet, physical activity guidelines for adults, stress management and opportunities for social connections. I hope you find this information useful.    -----------------------------------------------------------------------------------------------------------------------------------------------------------------------------------------------------------------------------------------------------------    NUTRITION: -eat real food: lots of colorful vegetables (half the plate) and fruits -5-7 servings of vegetables and fruits per day (fresh or steamed is best), exp. 2 servings of vegetables with lunch and dinner and 2 servings of fruit per day. Berries and greens such as kale and collards are great choices.  -consume on a regular basis:  fresh fruits, fresh veggies, fish, nuts, seeds, healthy oils (such as olive oil, avocado oil), whole grains (make sure for bread/pasta/crackers/etc., that the first ingredient on label contains the word "whole"), legumes. -can eat small amounts of dairy and lean meat (no larger than the palm of your hand), but avoid processed meats such as ham, bacon, lunch meat, etc. -drink water -try to avoid fast food and pre-packaged foods, processed meat, ultra processed foods/beverages (donuts, candy, etc.) -most experts advise limiting sodium to < 2300mg  per day, should limit further is any chronic conditions such as high blood pressure, heart disease, diabetes, etc. The American Heart Association advised that < 1500mg  is is ideal -try to avoid foods/beverages that contain any ingredients with names you do not recognize  -try to avoid foods/beverages  with added sugar or sweeteners/sweets  -try to avoid sweet drinks (including diet drinks): soda, juice, Gatorade, sweet tea, power drinks, diet drinks -try to avoid white rice, white bread, pasta (unless whole grain)  EXERCISE GUIDELINES FOR ADULTS: -if you wish to increase your physical activity, do so gradually and with the approval of your doctor -STOP and seek medical care immediately if you have any chest pain, chest discomfort or trouble breathing when starting or  increasing exercise  -move and stretch your body, legs, feet and arms when sitting for long periods -Physical activity guidelines for optimal health in adults: -get at least 150 minutes per week of moderate exercise (can talk, but not sing); this is about 20-30 minutes of sustained activity 5-7 days per week or two 10-15 minute episodes of sustained activity 5-7 days per week -do some muscle building/resistance training/strength training at least 2 days per week  -balance exercises 3+ days per week:   Stand somewhere where you have something sturdy to hold onto if you lose balance    1) lift up on toes, then back down, start with 5x per day and work up to 20x   2) stand and lift one leg straight out to the side so that foot is a few inches of the floor, start with 5x each side and work up to 20x each side   3) stand on one foot, start with 5 seconds  each side and work up to 20 seconds on each side  If you need ideas or help with getting more active:  -Silver sneakers https://tools.silversneakers.com  -Walk with a Doc: http://www.duncan-williams.com/  -try to include resistance (weight lifting/strength building) and balance exercises twice per week: or the following link for ideas: http://castillo-powell.com/  BuyDucts.dk  STRESS MANAGEMENT: -can try meditating, or just sitting quietly with deep breathing while intentionally relaxing all parts of your body for 5 minutes daily -if you need further help with stress, anxiety or depression please follow up with your primary doctor or contact the wonderful folks at [facility]: [phone]  SOCIAL CONNECTIONS: -options in [place] if you wish to engage in more social and exercise related activities:  -Silver sneakers https://tools.silversneakers.com  -Walk with a Doc: http://www.duncan-williams.com/  -Check out the [facility] Active Adults 50+  section on the [place] of [employer] (hiking clubs, book clubs, cards and games, chess, exercise classes, aquatic classes and much more) - see the website for details: https://www.[place].gov/departments/parks-recreation/active-adults50  -YouTube has lots of exercise videos for different ages and abilities as well  -[facility] (a variety of indoor and outdoor inperson activities for adults). [phone]. [address]  -Virtual Online Classes (a variety of topics): see seniorplanet.org or call [phone]  -consider volunteering at a school, hospice center, church, senior center or elsewhere          ADVANCED HEALTHCARE DIRECTIVES:  [place] Advanced Directives assistance:   ExpressWeek.com.cy  Everyone should have advanced health care directives in place. This is so that you get the care you want, should you ever be in a situation where you are unable to make your own medical decisions.   From the [place] Advanced Directive Website: "Advance Health Care Directives are legal documents in which you give written instructions about your health care if, in the future, you cannot speak for yourself.   A health care power of attorney allows you to name a person you trust to make your health care decisions if you cannot make them yourself. A declaration of a desire for a natural death (or living will) is document, which states that you desire not to have your life prolonged by extraordinary measures if you have a terminal or incurable illness or if you are in a vegetative state. An advance instruction for mental health treatment makes a declaration of instructions, information and preferences regarding your mental health treatment. It also states that you are aware that the advance instruction authorizes a mental health treatment provider to act according to your wishes. It may also outline your consent or  refusal of mental health treatment. A declaration of an anatomical gift allows anyone over the age of 81 to make a gift by will, organ donor card or other document."   Please see the following website or an elder law attorney for forms, FAQs and for completion of advanced directives: [place]  Print production planner Health Care Directives Advance Health Care Directives (http://guzman.[email or web address]/)  Or copy and paste the following to your web browser: PoshChat.[email or web address]    Maurie Southern, DO

## 2023-09-18 NOTE — Patient Instructions (Signed)
 I really enjoyed getting to talk with you today! I am available on Tuesdays and Thursdays for virtual visits if you have any questions or concerns, or if I can be of any further assistance.   CHECKLIST FROM ANNUAL WELLNESS VISIT:  -Follow up (please call to schedule if not scheduled after visit):   -yearly for annual wellness visit with primary care office  Here is a list of your preventive care/health maintenance measures and the plan for each if any are due:  PLAN For any measures below that may be due:   Health Maintenance  Topic Date Due   COVID-19 Vaccine (5 - 2024-25 season) 01/19/2023, please provide record of receipt so that we can update your chart   Lung Cancer Screening  12/18/2023, due in July   INFLUENZA VACCINE  12/19/2023   Medicare Annual Wellness (AWV)  09/17/2024   Colonoscopy  01/26/2031   DTaP/Tdap/Td (2 - Td or Tdap) 08/30/2031   Pneumonia Vaccine 84+ Years old  Completed   Hepatitis C Screening  Completed   Zoster Vaccines- Shingrix  Completed   HPV VACCINES  Aged Out   Meningococcal B Vaccine  Aged Out    -See a dentist at least yearly  -Get your eyes checked and then per your eye specialist's recommendations  -Other issues addressed today:   -I have included below further information regarding a healthy whole foods based diet, physical activity guidelines for adults, stress management and opportunities for social connections. I hope you find this information useful.   -----------------------------------------------------------------------------------------------------------------------------------------------------------------------------------------------------------------------------------------------------------    NUTRITION: -eat real food: lots of colorful vegetables (half the plate) and fruits -5-7 servings of vegetables and fruits per day (fresh or steamed is best), exp. 2 servings of vegetables with lunch and dinner and 2 servings of fruit per  day. Berries and greens such as kale and collards are great choices.  -consume on a regular basis:  fresh fruits, fresh veggies, fish, nuts, seeds, healthy oils (such as olive oil, avocado oil), whole grains (make sure for bread/pasta/crackers/etc., that the first ingredient on label contains the word "whole"), legumes. -can eat small amounts of dairy and lean meat (no larger than the palm of your hand), but avoid processed meats such as ham, bacon, lunch meat, etc. -drink water -try to avoid fast food and pre-packaged foods, processed meat, ultra processed foods/beverages (donuts, candy, etc.) -most experts advise limiting sodium to < 2300mg  per day, should limit further is any chronic conditions such as high blood pressure, heart disease, diabetes, etc. The American Heart Association advised that < 1500mg  is is ideal -try to avoid foods/beverages that contain any ingredients with names you do not recognize  -try to avoid foods/beverages  with added sugar or sweeteners/sweets  -try to avoid sweet drinks (including diet drinks): soda, juice, Gatorade, sweet tea, power drinks, diet drinks -try to avoid white rice, white bread, pasta (unless whole grain)  EXERCISE GUIDELINES FOR ADULTS: -if you wish to increase your physical activity, do so gradually and with the approval of your doctor -STOP and seek medical care immediately if you have any chest pain, chest discomfort or trouble breathing when starting or increasing exercise  -move and stretch your body, legs, feet and arms when sitting for long periods -Physical activity guidelines for optimal health in adults: -get at least 150 minutes per week of moderate exercise (can talk, but not sing); this is about 20-30 minutes of sustained activity 5-7 days per week or two 10-15 minute episodes of sustained activity 5-7  days per week -do some muscle building/resistance training/strength training at least 2 days per week  -balance exercises 3+ days per  week:   Stand somewhere where you have something sturdy to hold onto if you lose balance    1) lift up on toes, then back down, start with 5x per day and work up to 20x   2) stand and lift one leg straight out to the side so that foot is a few inches of the floor, start with 5x each side and work up to 20x each side   3) stand on one foot, start with 5 seconds each side and work up to 20 seconds on each side  If you need ideas or help with getting more active:  -Silver sneakers https://tools.silversneakers.com  -Walk with a Doc: http://www.duncan-williams.com/  -try to include resistance (weight lifting/strength building) and balance exercises twice per week: or the following link for ideas: http://castillo-powell.com/  BuyDucts.dk  STRESS MANAGEMENT: -can try meditating, or just sitting quietly with deep breathing while intentionally relaxing all parts of your body for 5 minutes daily -if you need further help with stress, anxiety or depression please follow up with your primary doctor or contact the wonderful folks at [facility]: [phone]  SOCIAL CONNECTIONS: -options in [place] if you wish to engage in more social and exercise related activities:  -Silver sneakers https://tools.silversneakers.com  -Walk with a Doc: http://www.duncan-williams.com/  -Check out the [facility] Active Adults 50+ section on the [place] of [employer] (hiking clubs, book clubs, cards and games, chess, exercise classes, aquatic classes and much more) - see the website for details: https://www.[place].gov/departments/parks-recreation/active-adults50  -YouTube has lots of exercise videos for different ages and abilities as well  -[facility] (a variety of indoor and outdoor inperson activities for adults). 252-595-8930. [address]  -Virtual Online Classes (a variety of  topics): see seniorplanet.org or call [phone]  -consider volunteering at a school, hospice center, church, senior center or elsewhere          ADVANCED HEALTHCARE DIRECTIVES:  [place] Advanced Directives assistance:   ExpressWeek.com.cy  Everyone should have advanced health care directives in place. This is so that you get the care you want, should you ever be in a situation where you are unable to make your own medical decisions.   From the [place] Advanced Directive Website: "Advance Health Care Directives are legal documents in which you give written instructions about your health care if, in the future, you cannot speak for yourself.   A health care power of attorney allows you to name a person you trust to make your health care decisions if you cannot make them yourself. A declaration of a desire for a natural death (or living will) is document, which states that you desire not to have your life prolonged by extraordinary measures if you have a terminal or incurable illness or if you are in a vegetative state. An advance instruction for mental health treatment makes a declaration of instructions, information and preferences regarding your mental health treatment. It also states that you are aware that the advance instruction authorizes a mental health treatment provider to act according to your wishes. It may also outline your consent or refusal of mental health treatment. A declaration of an anatomical gift allows anyone over the age of 22 to make a gift by will, organ donor card or other document."   Please see the following website or an elder law attorney for forms, FAQs and for completion of  advanced directives: [place]  Print production planner Health Care Directives Advance Health Care Directives (http://guzman.com/)  Or copy and paste the following to your web browser: PoshChat.fi

## 2023-10-18 ENCOUNTER — Other Ambulatory Visit: Payer: Self-pay | Admitting: Internal Medicine

## 2023-10-18 DIAGNOSIS — G4700 Insomnia, unspecified: Secondary | ICD-10-CM

## 2023-10-21 ENCOUNTER — Telehealth: Payer: Self-pay

## 2023-10-21 NOTE — Telephone Encounter (Signed)
 Copied from CRM [id number]. Topic: Appointments - Scheduling Inquiry for Clinic >> Oct 20, 2023  9:57 AM Derrick Mata wrote: Reason for CRM: Patient received a message on Mychart to set up an appointment for cpap on 08/21/23.  Confirmed appointment is for 11/20/23 at 10:30 am with NP, Sueanne Emerald and the office advised to arrive 15 minutes prior to appointment time. Patient verbalized understanding and asked is  this appointment for the cpap f/u from MyChart message 08/21/23. Please advise.   Phone call dropped. Called patient back and left a message that I will send the message to nurse for clarification and a call back.

## 2023-11-05 ENCOUNTER — Other Ambulatory Visit: Payer: Self-pay | Admitting: Internal Medicine

## 2023-11-06 ENCOUNTER — Other Ambulatory Visit: Payer: Self-pay | Admitting: Internal Medicine

## 2023-11-20 ENCOUNTER — Ambulatory Visit: Admitting: Primary Care

## 2023-11-26 ENCOUNTER — Telehealth: Payer: Self-pay

## 2023-11-26 DIAGNOSIS — G4733 Obstructive sleep apnea (adult) (pediatric): Secondary | ICD-10-CM

## 2023-11-26 NOTE — Telephone Encounter (Signed)
 Copied from CRM [id number]. Topic: Clinical - Order For Equipment >> Nov 19, 2023  8:39 AM Derrick Mata wrote: Reason for CRM: Pt stated he had a home sleep test completed in Feb of 2025. Pt stated he never received any CPAP supplies or equipment. Pt cancelled his appt for tomorrow with NP Almarie Ferrari due to his legs hurting, but would like an update on the order. Please call the pt back at [phone].    An order for [facility] was placed on 07-30-23 and was received. ATC x1. LMTCB

## 2023-11-27 NOTE — Telephone Encounter (Signed)
 [facility], he said they have been trying to contact patient regarding cpap order. He will restart his order and contact him again. Called pt:  Patient stated he was having issues with his phone, it is fixed now will be waiting to hear from them.

## 2023-11-27 NOTE — Telephone Encounter (Signed)
 ATC X2. LMTCB. I will send pt a message via Mychart then completing note per protocol. NFN

## 2023-12-04 ENCOUNTER — Other Ambulatory Visit: Payer: Self-pay | Admitting: Internal Medicine

## 2023-12-08 NOTE — Telephone Encounter (Signed)
 Received below secure email from Mitch with [employer]:  Happy Monday Gorge Almanza! I wanted to reach out about a patient that we are trying to get on CPAP. The first order we had was back in March and we tried to reach him 6 times but were unable to make contact or leave voicemails. I saw a MyChart message saying that he was having trouble with his phone, but it is fixed now. Everything looks good but we need an order within 90 days to comply with Medicare guidelines. Can a new order be entered and we can qualify the PAP and follow up with the patient.  Jamen Loiseau, Prentice L 2048-08-15  Please let me know when that goes in. Thanks!   Order has been placed.

## 2023-12-08 NOTE — Addendum Note (Signed)
 Addended by: CLAUDENE NEVINS A on: 12/08/2023 03:55 PM   Modules accepted: Orders

## 2023-12-22 ENCOUNTER — Ambulatory Visit
Admission: RE | Admit: 2023-12-22 | Discharge: 2023-12-22 | Disposition: A | Discharge status: Home / Self Care | Source: Ambulatory Visit | Attending: Acute Care | Admitting: Acute Care

## 2023-12-22 DIAGNOSIS — Z87891 Personal history of nicotine dependence: Secondary | ICD-10-CM | POA: Diagnosis not present

## 2023-12-22 DIAGNOSIS — Z122 Encounter for screening for malignant neoplasm of respiratory organs: Secondary | ICD-10-CM

## 2023-12-22 DIAGNOSIS — F17210 Nicotine dependence, cigarettes, uncomplicated: Secondary | ICD-10-CM

## 2023-12-31 ENCOUNTER — Other Ambulatory Visit: Payer: Self-pay | Admitting: Internal Medicine

## 2024-01-05 ENCOUNTER — Other Ambulatory Visit: Payer: Self-pay

## 2024-01-05 DIAGNOSIS — Z122 Encounter for screening for malignant neoplasm of respiratory organs: Secondary | ICD-10-CM

## 2024-01-05 DIAGNOSIS — Z87891 Personal history of nicotine dependence: Secondary | ICD-10-CM

## 2024-01-09 ENCOUNTER — Other Ambulatory Visit: Payer: Self-pay | Admitting: Internal Medicine

## 2024-01-11 ENCOUNTER — Other Ambulatory Visit: Payer: Self-pay | Admitting: Internal Medicine

## 2024-01-12 ENCOUNTER — Ambulatory Visit: Payer: Self-pay | Admitting: *Deleted

## 2024-01-12 ENCOUNTER — Other Ambulatory Visit: Payer: Self-pay | Admitting: Internal Medicine

## 2024-01-12 NOTE — Telephone Encounter (Signed)
 FYI Only or Action Required?: Action required by provider: patient concerned he is running out of medication and can not get refills when requested. Reviewed with patient Rx ordered today and sent to pharmacy .  Patient was last seen in primary care on 09/18/2023 by Luke Chiquita SAUNDERS, DO.  Called Nurse Triage reporting Foot Swelling.  Symptoms began a week ago.  Interventions attempted: Prescription medications: furosemide   and Rest, hydration, or home remedies.  Symptoms are: stable.  Triage Disposition: See PCP When Office is Open (Within 3 Days)  Patient/caregiver understands and will follow disposition?: No, wishes to speak with PCP            Copied from CRM #[id number]. Topic: Clinical - Red Word Triage >> Jan 12, 2024 12:37 PM Berneda FALCON wrote: Red Word that prompted transfer to Nurse Triage: Pt states he has been out of his medication and is now experiencing swelling in both his feet and ankles as a result. He tried to get this refilled before he left for vacation at [facility] [place].  Medication: furosemide  Reason for Disposition  [1] MILD swelling of both ankles (i.e., pedal edema) AND [2] new-onset or getting worse  Answer Assessment - Initial Assessment Questions Reviewed with patient Rx sent to [facility] for furosemide  01/12/24. Patient reports he has had issues in the past with getting Rx refilled in time and has been running out too soon. Patient reports he takes medication as ordered 2 pills  in am. Patient reports he was able to get 10 tablets from [facility] at [employer]. Recommended if sx worsen or further questions please call back or go to ED.       1. ONSET: When did the swelling start? (e.g., minutes, hours, days)     Last weekend  2. LOCATION: What part of the leg is swollen?  Are both legs swollen or just one leg?     Feet and ankles  3. SEVERITY: How bad is the swelling? (e.g., localized; mild, moderate, severe)     Ankles hanging over shoes  4.  REDNESS: Is there redness or signs of infection?     No 5. PAIN: Is the swelling painful to touch? If Yes, ask: How painful is it?   (Scale 1-10; mild, moderate or severe)     No  6. FEVER: Do you have a fever? If Yes, ask: What is it, how was it measured, and when did it start?      na 7. CAUSE: What do you think is causing the leg swelling?     Has been without medication furosemide  x 1 1/2 weeks 8. MEDICAL HISTORY: Do you have a history of blood clots (e.g., DVT), cancer, heart failure, kidney disease, or liver failure?     See hx  9. RECURRENT SYMPTOM: Have you had leg swelling before? If Yes, ask: When was the last time? What happened that time?     Yes  10. OTHER SYMPTOMS: Do you have any other symptoms? (e.g., chest pain, difficulty breathing)       Denies chest pain no difficulty breathing can walk no pain. From swelling 11. PREGNANCY: Is there any chance you are pregnant? When was your last menstrual period?       na  Protocols used: Leg Swelling and Edema-A-AH

## 2024-01-16 ENCOUNTER — Other Ambulatory Visit: Payer: Self-pay | Admitting: Internal Medicine

## 2024-01-16 DIAGNOSIS — G4700 Insomnia, unspecified: Secondary | ICD-10-CM

## 2024-02-03 ENCOUNTER — Other Ambulatory Visit: Payer: Self-pay | Admitting: Internal Medicine

## 2024-02-03 DIAGNOSIS — F3342 Major depressive disorder, recurrent, in full remission: Secondary | ICD-10-CM

## 2024-02-09 ENCOUNTER — Other Ambulatory Visit: Payer: Self-pay | Admitting: Internal Medicine

## 2024-02-09 DIAGNOSIS — F3342 Major depressive disorder, recurrent, in full remission: Secondary | ICD-10-CM

## 2024-02-15 ENCOUNTER — Other Ambulatory Visit: Payer: Self-pay | Admitting: Internal Medicine

## 2024-02-20 ENCOUNTER — Other Ambulatory Visit: Payer: Self-pay | Admitting: Physician Assistant

## 2024-02-20 DIAGNOSIS — I5043 Acute on chronic combined systolic (congestive) and diastolic (congestive) heart failure: Secondary | ICD-10-CM

## 2024-02-20 MED ORDER — SACUBITRIL-VALSARTAN 24-26 MG PO TABS: ORAL_TABLET | ORAL | 1 refills | Status: AC

## 2024-02-25 ENCOUNTER — Other Ambulatory Visit: Payer: Self-pay | Admitting: Internal Medicine

## 2024-02-25 ENCOUNTER — Ambulatory Visit: Payer: Self-pay

## 2024-02-25 DIAGNOSIS — F3342 Major depressive disorder, recurrent, in full remission: Secondary | ICD-10-CM

## 2024-02-25 NOTE — Telephone Encounter (Signed)
 FYI Only or Action Required?: FYI only for provider.  Patient was last seen in primary care on 09/18/2023 by Luke Chiquita SAUNDERS, DO.  Called Nurse Triage reporting Back Pain.  Symptoms began several weeks ago.  Interventions attempted: OTC medications: Tylenol .  Symptoms are: gradually worsening.  Triage Disposition: See PCP When Office is Open (Within 3 Days)  Patient/caregiver understands and will follow disposition?: Yes  Copied from CRM #[id number]. Topic: Clinical - Red Word Triage >> Feb 25, 2024 12:12 PM Rea ORN wrote: Red Word that prompted transfer to Nurse Triage: Sharp pain in lower back within the last 2 weeks. Pt called because he needs refills on Omeprazole  20 MG, Furosemide  40 MG, buPROPion  HCl 150 MG and PCP refused them due to pt needing an appt. Reason for Disposition  [1] MODERATE back pain (e.g., interferes with normal activities) AND [2] present > 3 days  Answer Assessment - Initial Assessment Questions 1. ONSET: When did the pain begin? (e.g., minutes, hours, days)     2 weeks ago  2. LOCATION: Where does it hurt? (upper, mid or lower back)     Lower back pain  3. SEVERITY: How bad is the pain?  (e.g., Scale 1-10; mild, moderate, or severe)     No pain at the moment  4. PATTERN: Is the pain constant? (e.g., yes, no; constant, intermittent)      Intermittent  5. RADIATION: Does the pain shoot into your legs or somewhere else?     No  6. CAUSE:  What do you think is causing the back pain?      Has history of back surgery and pain, but pain is increasing over past 2 weeks  7. BACK OVERUSE:  Any recent lifting of heavy objects, strenuous work or exercise?     No  8. MEDICINES: What have you taken so far for the pain? (e.g., nothing, acetaminophen , NSAIDS)     *No Answer* 9. NEUROLOGIC SYMPTOMS: Do you have any weakness, numbness, or problems with bowel/bladder control?     No  10. OTHER SYMPTOMS: Do you have any other symptoms? (e.g., fever,  abdomen pain, burning with urination, blood in urine)       No  Protocols used: Back Pain-A-AH

## 2024-02-26 ENCOUNTER — Ambulatory Visit (INDEPENDENT_AMBULATORY_CARE_PROVIDER_SITE_OTHER): Admitting: Internal Medicine

## 2024-02-26 VITALS — BP 124/78 | HR 94 | Temp 98.8°F | Wt >= 6400 oz

## 2024-02-26 DIAGNOSIS — G4733 Obstructive sleep apnea (adult) (pediatric): Secondary | ICD-10-CM

## 2024-02-26 DIAGNOSIS — K219 Gastro-esophageal reflux disease without esophagitis: Secondary | ICD-10-CM

## 2024-02-26 DIAGNOSIS — I10 Essential (primary) hypertension: Secondary | ICD-10-CM

## 2024-02-26 DIAGNOSIS — F3342 Major depressive disorder, recurrent, in full remission: Secondary | ICD-10-CM | POA: Diagnosis not present

## 2024-02-26 DIAGNOSIS — I509 Heart failure, unspecified: Secondary | ICD-10-CM

## 2024-02-26 MED ORDER — OMEPRAZOLE 20 MG PO CPDR: DELAYED_RELEASE_CAPSULE | ORAL | 1 refills | Status: AC

## 2024-02-26 MED ORDER — BUPROPION HCL ER (XL) 150 MG PO TB24: ORAL_TABLET | ORAL | 1 refills | Status: AC

## 2024-02-26 MED ORDER — FUROSEMIDE 40 MG PO TABS: ORAL_TABLET | ORAL | 1 refills | Status: AC

## 2024-02-26 NOTE — Progress Notes (Signed)
 Established Patient Office Visit     CC/Reason for Visit: Medication refills  HPI: Derrick Mata is a 73 y.o. male who is coming in today for the above mentioned reasons. 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  He has not been seen in over a year and thus his medication refills were denied.  He is here today for medications.   Past Medical/Surgical History: Past Medical History:  Diagnosis Date   Arthritis    OA of knees   CHF (congestive heart failure) (HCC)    Closed compression fracture of L1 lumbar vertebra 09/03/2016   Colon polyp    Complication of anesthesia    slow to wake up   COPD (chronic obstructive pulmonary disease) (HCC)    > 30 pack yr smoker   Depression    Anxiety and Insomnia   Diverticulosis    Erectile dysfunction    GERD (gastroesophageal reflux disease)    Hemorrhoids    Hep C w/o coma, chronic (HCC) 06/12/2015   treated with medications   Leg edema    Morbid obesity (HCC)    Sleep apnea    refuses CPAP   Substance abuse (HCC)    hx alcohol  and cocaine use, 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, 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    Past Surgical History:  Procedure Laterality Date   CARDIOVERSION N/A 04/24/2023   Procedure: CARDIOVERSION;  Surgeon: Santo Stanly LABOR, MD;  Location: [facility];  Service: Cardiovascular;  Laterality: N/A;   COLONOSCOPY  04/18/2011   Procedure: COLONOSCOPY;  Surgeon: Norleen Kiang, MD;  Location: [facility];  Service: Endoscopy;  Laterality: N/A;   COLONOSCOPY WITH PROPOFOL  N/A 05/07/2016   Procedure: COLONOSCOPY WITH PROPOFOL ;  Surgeon: Norleen LOISE Kiang, MD;  Location: [facility];  Service: Endoscopy;  Laterality: N/A;   COLONOSCOPY WITH PROPOFOL  N/A 08/12/2017   Procedure: COLONOSCOPY WITH PROPOFOL ;  Surgeon: Kiang Norleen LOISE, MD;  Location: [facility]  ENDOSCOPY;  Service: Endoscopy;  Laterality: N/A;   COLONOSCOPY WITH PROPOFOL  N/A 01/25/2021   Procedure: COLONOSCOPY WITH PROPOFOL ;  Surgeon: Kiang Norleen LOISE, MD;  Location: [facility];  Service: Endoscopy;  Laterality: N/A;   ESOPHAGOGASTRODUODENOSCOPY (EGD) WITH PROPOFOL  N/A 05/07/2016   Procedure: ESOPHAGOGASTRODUODENOSCOPY (EGD) WITH PROPOFOL ;  Surgeon: Norleen LOISE Kiang, MD;  Location: [facility];  Service: Endoscopy;  Laterality: N/A;   ESOPHAGOGASTRODUODENOSCOPY (EGD) WITH PROPOFOL  N/A 01/25/2021   Procedure: ESOPHAGOGASTRODUODENOSCOPY (EGD) WITH PROPOFOL ;  Surgeon: Kiang Norleen LOISE, MD;  Location: [facility];  Service: Endoscopy;  Laterality: N/A;   KNEE ARTHROSCOPY  2011   left   LUMBAR LAMINECTOMY  1994, 2010   POLYPECTOMY  01/25/2021   Procedure: POLYPECTOMY;  Surgeon: Kiang Norleen LOISE, MD;  Location: [facility];  Service: Endoscopy;;   TIBIA FRACTURE SURGERY  2006   hardware   TONSILLECTOMY  1963    Social History:  reports that he has quit smoking. His smoking use included cigarettes. He started smoking about 60 years ago. He has a 60.8 pack-year smoking history. He has been exposed to tobacco smoke. He has never used smokeless tobacco. He reports that he does not drink alcohol  and does not use drugs.  Allergies: Allergies  Allergen Reactions   Penicillins Anaphylaxis, Nausea Only and Other (See Comments)    Passed out.   Sulfamethoxazole Hives    Family History:  Family  History  Problem Relation Age of Onset   Breast cancer Mother    Heart disease Father 2   Colon cancer Sister    Cancer Sister 56       sister, colon    Heart disease Other    Stomach cancer Neg Hx    Rectal cancer Neg Hx    Esophageal cancer Neg Hx    Pancreatic cancer Neg Hx      Current Outpatient Medications:    albuterol  (VENTOLIN  HFA) 108 (90 Base) MCG/ACT inhaler, INHALE 2 PUFFS INTO THE LUNGS EVERY 6 HOURS AS NEEDED FOR WHEEZING OR SHORTNESS OF BREATH, Disp: 6.7 g, Rfl: 0   apixaban  (ELIQUIS )  5 MG TABS tablet, Take 1 tablet (5 mg total) by mouth 2 (two) times daily., Disp: 60 tablet, Rfl: 5   diclofenac  Sodium (VOLTAREN ) 1 % GEL, Apply 4 g topically 4 (four) times daily., Disp: 100 g, Rfl: 0   metoprolol  succinate (TOPROL  XL) 25 MG 24 hr tablet, Take 1 tablet (25 mg total) by mouth at bedtime., Disp: 90 tablet, Rfl: 3   Multiple Vitamin (MULTIVITAMIN WITH MINERALS) TABS tablet, Take 1 tablet by mouth daily., Disp: , Rfl:    Omega-3 Fatty Acids (FISH OIL) 1000 MG CAPS, Take 1,000 mg by mouth daily. 300 mg omega 3, Disp: , Rfl:    rosuvastatin  (CRESTOR ) 40 MG tablet, Take 1 tablet (40 mg total) by mouth daily., Disp: 90 tablet, Rfl: 3   sacubitril -valsartan  (ENTRESTO ) 24-26 MG, TAKE 1 TABLET BY MOUTH TWICE DAILY., Disp: 180 tablet, Rfl: 1   spironolactone  (ALDACTONE ) 25 MG tablet, Take 1 tablet (25 mg total) by mouth daily., Disp: 90 tablet, Rfl: 3   traMADol  (ULTRAM ) 50 MG tablet, Take 1 tablet (50 mg total) by mouth every 12 (twelve) hours as needed., Disp: 30 tablet, Rfl: 1   traZODone  (DESYREL ) 150 MG tablet, TAKE 1 TABLET(150 MG) BY MOUTH AT BEDTIME, Disp: 90 tablet, Rfl: 0   buPROPion  (WELLBUTRIN  XL) 150 MG 24 hr tablet, TAKE 1 TABLET(150 MG) BY MOUTH DAILY, Disp: 90 tablet, Rfl: 1   furosemide  (LASIX ) 40 MG tablet, TAKE 2 TABLETS(80 MG) BY MOUTH DAILY IN THE MORNING, Disp: 180 tablet, Rfl: 1   omeprazole  (PRILOSEC) 20 MG capsule, TAKE 1 CAPSULE(20 MG) BY MOUTH DAILY, Disp: 90 capsule, Rfl: 1  Review of Systems:  Negative unless indicated in HPI.   Physical Exam: Vitals:   02/26/24 0957  BP: 124/78  Pulse: 94  Temp: 98.8 F (37.1 C)  TempSrc: Oral  SpO2: 98%  Weight: (!) 415 lb 3.2 oz (188.3 kg)    Body mass index is 57.91 kg/m.   Physical Exam   Impression and Plan:  Essential hypertension  Recurrent major depressive disorder, in full remission -     buPROPion  HCl ER (XL); TAKE 1 TABLET(150 MG) BY MOUTH DAILY  Dispense: 90 tablet; Refill: 1  OSA  (obstructive sleep apnea)  Morbid obesity (HCC)  Congestive heart failure, NYHA class 1, unspecified congestive heart failure type (HCC) -     Furosemide ; TAKE 2 TABLETS(80 MG) BY MOUTH DAILY IN THE MORNING  Dispense: 180 tablet; Refill: 1  Gastroesophageal reflux disease, unspecified whether esophagitis present -     Omeprazole ; TAKE 1 CAPSULE(20 MG) BY MOUTH DAILY  Dispense: 90 capsule; Refill: 1   - Omeprazole , Wellbutrin , furosemide  have been refilled. - He has been advised of importance of routine medical follow-up. - Asked to schedule appointment for annual physical. - Chronic medical issues appear  to be at baseline.   Time spent:20 minutes reviewing chart, interviewing and examining patient and formulating plan of care.     Tully Theophilus Andrews, MD [facility]

## 2024-02-26 NOTE — Progress Notes (Signed)
 Derrick Mata

## 2024-03-01 ENCOUNTER — Other Ambulatory Visit: Payer: Self-pay | Admitting: Cardiology

## 2024-03-07 DIAGNOSIS — G4733 Obstructive sleep apnea (adult) (pediatric): Secondary | ICD-10-CM | POA: Diagnosis not present

## 2024-03-22 ENCOUNTER — Encounter: Payer: Self-pay | Admitting: Radiology

## 2024-04-05 ENCOUNTER — Other Ambulatory Visit: Payer: Self-pay

## 2024-04-05 DIAGNOSIS — I5043 Acute on chronic combined systolic (congestive) and diastolic (congestive) heart failure: Secondary | ICD-10-CM

## 2024-04-06 ENCOUNTER — Other Ambulatory Visit: Payer: Self-pay | Admitting: Student

## 2024-04-06 DIAGNOSIS — I5043 Acute on chronic combined systolic (congestive) and diastolic (congestive) heart failure: Secondary | ICD-10-CM

## 2024-04-07 MED ORDER — SPIRONOLACTONE 25 MG PO TABS: 25.0000 mg | ORAL_TABLET | Freq: Every day | ORAL | 1 refills | Status: AC

## 2024-04-14 ENCOUNTER — Other Ambulatory Visit: Payer: Self-pay

## 2024-04-14 ENCOUNTER — Other Ambulatory Visit: Payer: Self-pay | Admitting: Internal Medicine

## 2024-04-14 DIAGNOSIS — G4700 Insomnia, unspecified: Secondary | ICD-10-CM

## 2024-04-19 ENCOUNTER — Other Ambulatory Visit: Payer: Self-pay | Admitting: Cardiology

## 2024-04-20 MED ORDER — METOPROLOL SUCCINATE ER 25 MG PO TB24: 25.0000 mg | ORAL_TABLET | Freq: Every day | ORAL | 0 refills | Status: AC

## 2024-04-27 ENCOUNTER — Other Ambulatory Visit: Payer: Self-pay | Admitting: Cardiology

## 2024-04-27 DIAGNOSIS — I4891 Unspecified atrial fibrillation: Secondary | ICD-10-CM

## 2024-04-27 NOTE — Telephone Encounter (Signed)
 Eliquis  5mg  refill request received. Patient is 73 years old, weight-188.3kg, Crea-1.05 on 07/21/23, Diagnosis-Afib, and last seen by Dr. Pietro on 07/21/23 and was due in 6 months-sent message to Schedulers and send limited refill. Dose is appropriate based on dosing criteria.

## 2024-05-05 NOTE — Progress Notes (Unsigned)
 Cardiology Office Note   Date:  05/06/2024  ID:  Derrick Mata, Pieper 1950/05/26, MRN [id number] PCP: Theophilus Andrews, Tully GRADE, MD  [facility] Providers Cardiologist:  Redell Shallow, MD Cardiology APP:  Madie Jon Garre, [place]     History of Present Illness Derrick Mata is a 73 y.o. male with history of CHF, atrial fibrillation, aortic atherosclerosis, HTN, hepatic cirrhosis, OSA, and tobacco abuse.     He has been followed by Dr. Shallow with multiple complaints of edema. 03/23/18 Echo showed LVEF 40-45%, grade I DD, bilateral atrial enlargement, and RVH. Nuclear stress test 05/2018 revealed no ischemia. Repeat echo LVEF 35-40%, grade 1 DD, normal RV, no mention of BAE, and ascending aorta 39 mm. Chest CT 11/2022 showed aortic atherosclerosis and coronary calcification. 11/28/22 had new onset a-fib. Monitor 02/2023 showed rate controlled a-fib. Echo 03/03/2023 LVEF 55-60%, no RWMA, RV normal, and mild aortic regurgitation. DCCV 04/24/23 was successful with early recurrence. With shared decision making, it was determined that since he is asymptomatic, rate control and anticoagulation was the best option long term. He was last seen in office 07/21/2023 and denied anginal symptoms, and remained in rate controlled a-fib.     He presents today for overdue follow regarding his atrial fibrillation and CHF. He has been doing well overall. He watches his grandchild during the day, but is more sedentary. He did see the pulmonologist this year for his sleep apnea. He did say that he is not going to use the machine because it is uncomfortable. He does have bilateral trace edema. He denies any CP, SOB, lightheadedness, dizziness, presyncope, palpitations,and syncope episodes.   ROS: All systems are negative unless otherwise indicated in HPI.   Studies Reviewed EKG Interpretation Date/Time:  Thursday May 06 2024 13:35:00 EST Ventricular Rate:  69 PR Interval:    QRS Duration:  94 QT  Interval:  402 QTC Calculation: 430 R Axis:   -33  Text Interpretation: Atrial fibrillation Left axis deviation Low voltage QRS Inferior infarct (cited on or before 28-Nov-2022) Cannot rule out Anterior infarct (cited on or before 28-Nov-2022) Confirmed by Teresa Fish [phone]) on 05/06/2024 1:36:48 PM    Cardiac Studies & Procedures   ______________________________________________________________________________________________   STRESS TESTS  MYOCARDIAL PERFUSION IMAGING 05/28/2018  Interpretation Summary  Nuclear stress EF: 47%. The left ventricular ejection fraction is mildly decreased (45-54%). Visually, the EF appears to be greater than 47%.  There was no ST segment deviation noted during stress.  The study is normal.  This is a low risk study. There is no evidence of ischemia and no evidence of myocardial infarction.   ECHOCARDIOGRAM  ECHOCARDIOGRAM COMPLETE 03/03/2023  Narrative ECHOCARDIOGRAM REPORT    Patient Name:   Derrick Mata Date of Exam: 03/03/2023 Medical Rec #:  [id number]        Height:       69.0 in Accession #:    [id number]       Weight:       375.0 lb Date of Birth:  07-07-1950        BSA:          2.699 m Patient Age:    72 years         BP:           129/33 mmHg Patient Gender: M                HR:  99 bpm. Exam Location:  [address]  Procedure: 2D Echo, 3D Echo, Cardiac Doppler, Color Doppler and Strain Analysis  Indications:    I48.91 Atrial Fibrillation  History:        Patient has prior history of Echocardiogram examinations, most recent 04/08/2019. CHF, Signs/Symptoms:Edema; Risk Factors:Hypertension, HLD and Sleep Apnea.  Sonographer:    Waldo Guadalajara RCS Referring Phys: [id number] BARNIE HILA  IMPRESSIONS   1. Compared to echo report from 2020, LVEF is improved. 2. Left ventricular ejection fraction, by estimation, is 55 to 60%. The left ventricle has normal function. The left ventricle has no regional wall  motion abnormalities. There is mild left ventricular hypertrophy. Left ventricular diastolic parameters are indeterminate. 3. Right ventricular systolic function is normal. The right ventricular size is normal. There is normal pulmonary artery systolic pressure. 4. The mitral valve is normal in structure. Trivial mitral valve regurgitation. 5. The aortic valve is tricuspid. Aortic valve regurgitation is mild.  FINDINGS Left Ventricle: Left ventricular ejection fraction, by estimation, is 55 to 60%. The left ventricle has normal function. The left ventricle has no regional wall motion abnormalities. Global longitudinal strain performed but not reported based on interpreter judgement due to suboptimal tracking. The left ventricular internal cavity size was normal in size. There is mild left ventricular hypertrophy. Left ventricular diastolic parameters are indeterminate.  Right Ventricle: The right ventricular size is normal. Right vetricular wall thickness was not assessed. Right ventricular systolic function is normal. There is normal pulmonary artery systolic pressure. The tricuspid regurgitant velocity is 1.87 m/s, and with an assumed right atrial pressure of 3 mmHg, the estimated right ventricular systolic pressure is 17.0 mmHg.  Left Atrium: Left atrial size was normal in size.  Right Atrium: Right atrial size was normal in size.  Pericardium: There is no evidence of pericardial effusion.  Mitral Valve: The mitral valve is normal in structure. Trivial mitral valve regurgitation.  Tricuspid Valve: The tricuspid valve is normal in structure. Tricuspid valve regurgitation is trivial.  Aortic Valve: The aortic valve is tricuspid. Aortic valve regurgitation is mild. Aortic regurgitation PHT measures 311 msec.  Pulmonic Valve: The pulmonic valve was grossly normal. Pulmonic valve regurgitation is not visualized.  Aorta: The aortic root is normal in size and structure.  IAS/Shunts: No  atrial level shunt detected by color flow Doppler.   LEFT VENTRICLE PLAX 2D LVIDd:         5.40 cm   Diastology LVIDs:         3.60 cm   LV e' medial:    11.60 cm/s LV PW:         1.00 cm   LV E/e' medial:  7.0 LV IVS:        1.20 cm   LV e' lateral:   13.30 cm/s LVOT diam:     2.10 cm   LV E/e' lateral: 6.1 LV SV:         44 LV SV Index:   16        2D Longitudinal Strain LVOT Area:     3.46 cm  2D Strain GLS (A2C):   -16.0 % 2D Strain GLS (A3C):   -15.4 % 2D Strain GLS (A4C):   -15.7 % 2D Strain GLS Avg:     -15.7 %  3D Volume EF: 3D EF:        54 % LV EDV:       131 ml LV ESV:       61 ml LV  SV:        70 ml  RIGHT VENTRICLE RV Basal diam:  4.30 cm RV Mid diam:    3.30 cm RV S prime:     11.20 cm/s TAPSE (M-mode): 2.7 cm RVSP:           17.0 mmHg  LEFT ATRIUM             Index        RIGHT ATRIUM           Index LA diam:        4.70 cm 1.74 cm/m   RA Pressure: 3.00 mmHg LA Vol (A2C):   84.9 ml 31.46 ml/m  RA Area:     24.80 cm LA Vol (A4C):   82.9 ml 30.72 ml/m  RA Volume:   84.20 ml  31.20 ml/m LA Biplane Vol: 86.4 ml 32.02 ml/m AORTIC VALVE LVOT Vmax:   84.60 cm/s LVOT Vmean:  57.900 cm/s LVOT VTI:    0.126 m AI PHT:      311 msec  AORTA Ao Root diam: 3.70 cm Ao Asc diam:  4.50 cm  MITRAL VALVE               TRICUSPID VALVE MV Area (PHT):             TR Peak grad:   14.0 mmHg MV Decel Time:             TR Vmax:        187.00 cm/s MV E velocity: 81.75 cm/s  Estimated RAP:  3.00 mmHg RVSP:           17.0 mmHg  SHUNTS Systemic VTI:  0.13 m Systemic Diam: 2.10 cm  Vina Gull MD Electronically signed by Vina Gull MD Signature Date/Time: 03/03/2023/1:18:53 PM    Final    MONITORS  LONG TERM MONITOR (3-14 DAYS) 03/03/2023  Narrative Patch Wear Time:  10 days and 16 hours (2024-09-15T14:47:56-0400 to 2024-10-02T09:01:09-398)  Monitor 1 Atrial Fibrillation occurred continuously (100% burden), ranging from 44-175 bpm (avg of 78 bpm).  Isolated VEs were rare (<1.0%, 1626), VE Couplets were rare (<1.0%, 22), and VE Triplets were rare (<1.0%, 1).  Monitor 2 Atrial Fibrillation occurred continuously (100% burden), ranging from 36-146 bpm (avg of 67 bpm). Isolated VEs were rare (<1.0%), VE Couplets were rare (<1.0%), and no VE Triplets were present. Ventricular Bigeminy was present.  Atrial fibrillation with PVCs or aberrantly conducted beats.  Rate controlled. Redell Shallow       ______________________________________________________________________________________________      Risk Assessment/Calculations  CHA2DS2-VASc Score = 4   This indicates a 4.8% annual risk of stroke. The patient's score is based upon: CHF History: 1 HTN History: 1 Diabetes History: 0 Stroke History: 0 Vascular Disease History: 0 Age Score: 2 Gender Score: 0            Physical Exam VS:  BP 100/68   Pulse 78   Ht 5' 11 (1.803 m)   Wt (!) 422 lb (191.4 kg)   SpO2 95%   BMI 58.86 kg/m        Wt Readings from Last 3 Encounters:  05/06/24 (!) 422 lb (191.4 kg)  02/26/24 (!) 415 lb 3.2 oz (188.3 kg)  07/21/23 (!) 392 lb 9.6 oz (178.1 kg)    GEN: Well nourished, well developed in no acute distress NECK: No JVD; No carotid bruits CARDIAC: IRIR, no murmurs, rubs, gallops RESPIRATORY:  Clear to auscultation without rales, wheezing or  rhonchi  ABDOMEN: Soft, non-tender, non-distended EXTREMITIES:  No edema; No deformity   ASSESSMENT AND PLAN  Permanent a-fib- His EKG today is rate controlled a-fib. He reports not missing a dose of OAC. He denies palpitations, dizziness, and lightheadedness. Continue Eliquis  5 mg BID and metoprolol  succinate 25 mg.   CHF- Echo 03/03/2023 LVEF 55-60%, no RWMA, RV normal, and mild aortic regurgitation. He denies SOB, PND, and CP today. He is sedentary during the day other than caring for his grandchild. He does not have any symptoms when doing so.Some bilateral trace edema was noted on exam. He  says this is his normal. We discussed wearing compression stockings and elevating his feet. GDMT- furosemide  40 mg BID, rosuvastatin  40 mg, Entresto  24-26 mg, and spironolactone  25 mg.   Coronary calcification- Nuclear stress test 05/2018 revealed no ischemia. Chest CT 11/2022 showed aortic atherosclerosis and coronary calcification. He denies CP, DOE, and syncope today. Continue rosuvastatin  40 mg and metoprolol  succinate 25 mg.   Hypertension- BP is well controlled today and historically controlled in office. Continue Entresto  24-26 mg and metoprolol  succinate 25 mg.   Former Tobacco abuse- Stopped smoking 01/24. 62 pack years.   HLD, goal <70- Last LDL 64 on 11/07/22. He will get repeat lipid panel with PCP as he is not fasting today. Continue rosuvastatin  40 mg.   OSA- He is not compliant on CPAP. He recently saw Pulmonology this year. They gave him a new machine and he is not using it because it is not comfortable. Educated on the benefits and the other options for sleep management. He is reluctant to try, as he says he does not snore sleeping in his chair.       Dispo: Follow up with Dr. Pietro in 6 months.   Signed, Mardy KATHEE Pizza, FNP

## 2024-05-06 ENCOUNTER — Ambulatory Visit: Attending: Cardiovascular Disease

## 2024-05-06 VITALS — BP 100/68 | HR 78 | Ht 71.0 in | Wt >= 6400 oz

## 2024-05-06 DIAGNOSIS — G4733 Obstructive sleep apnea (adult) (pediatric): Secondary | ICD-10-CM

## 2024-05-06 DIAGNOSIS — I4821 Permanent atrial fibrillation: Secondary | ICD-10-CM | POA: Diagnosis not present

## 2024-05-06 DIAGNOSIS — E785 Hyperlipidemia, unspecified: Secondary | ICD-10-CM | POA: Diagnosis not present

## 2024-05-06 DIAGNOSIS — I10 Essential (primary) hypertension: Secondary | ICD-10-CM | POA: Diagnosis not present

## 2024-05-06 DIAGNOSIS — I5043 Acute on chronic combined systolic (congestive) and diastolic (congestive) heart failure: Secondary | ICD-10-CM

## 2024-05-06 DIAGNOSIS — I2510 Atherosclerotic heart disease of native coronary artery without angina pectoris: Secondary | ICD-10-CM

## 2024-05-06 DIAGNOSIS — Z87891 Personal history of nicotine dependence: Secondary | ICD-10-CM | POA: Diagnosis not present

## 2024-05-06 NOTE — Patient Instructions (Signed)
 Medication Instructions:  Your physician recommends that you continue on your current medications as directed. Please refer to the Current Medication list given to you today.  *If you need a refill on your cardiac medications before your next appointment, please call your pharmacy*  Lab Work: NONE ORDERED  Testing/Procedures: NONE ORDERED  Follow-Up: At [facility], you and your health needs are our priority.  As part of our continuing mission to provide you with exceptional heart care, our providers are all part of one team.  This team includes your primary Cardiologist (physician) and Advanced Practice Providers or APPs (Physician Assistants and Nurse Practitioners) who all work together to provide you with the care you need, when you need it.  Your next appointment:   6 month(s)  Provider:   Redell Shallow, MD or any APP    We recommend signing up for the patient portal called MyChart.  Sign up information is provided on this After Visit Summary.  MyChart is used to connect with patients for Virtual Visits (Telemedicine).  Patients are able to view lab/test results, encounter notes, upcoming appointments, etc.  Non-urgent messages can be sent to your provider as well.   To learn more about what you can do with MyChart, go to [email or web address].

## 2024-05-31 LAB — OPHTHALMOLOGY REPORT-SCANNED

## 2024-06-22 ENCOUNTER — Encounter: Admitting: Internal Medicine

## 2024-09-22 ENCOUNTER — Encounter: Admitting: Internal Medicine
